# Patient Record
Sex: Male | Born: 1970 | Race: Black or African American | Hispanic: No
Health system: Southern US, Community
[De-identification: ages and names within clinical notes are randomized; demographics above are authoritative.]

## PROBLEM LIST (undated history)

## (undated) DIAGNOSIS — K219 Gastro-esophageal reflux disease without esophagitis: Secondary | ICD-10-CM

## (undated) DIAGNOSIS — I729 Aneurysm of unspecified site: Secondary | ICD-10-CM

## (undated) DIAGNOSIS — C801 Malignant (primary) neoplasm, unspecified: Secondary | ICD-10-CM

## (undated) DIAGNOSIS — T7840XA Allergy, unspecified, initial encounter: Secondary | ICD-10-CM

## (undated) DIAGNOSIS — D649 Anemia, unspecified: Secondary | ICD-10-CM

## (undated) DIAGNOSIS — C169 Malignant neoplasm of stomach, unspecified: Secondary | ICD-10-CM

## (undated) DIAGNOSIS — K621 Rectal polyp: Secondary | ICD-10-CM

## (undated) DIAGNOSIS — K859 Acute pancreatitis without necrosis or infection, unspecified: Secondary | ICD-10-CM

## (undated) DIAGNOSIS — R109 Unspecified abdominal pain: Secondary | ICD-10-CM

## (undated) DIAGNOSIS — R569 Unspecified convulsions: Secondary | ICD-10-CM

## (undated) DIAGNOSIS — J45909 Unspecified asthma, uncomplicated: Secondary | ICD-10-CM

## (undated) DIAGNOSIS — F4310 Post-traumatic stress disorder, unspecified: Secondary | ICD-10-CM

## (undated) HISTORY — DX: Aneurysm of unspecified site: I72.9

## (undated) HISTORY — PX: UPPER GASTROINTESTINAL ENDOSCOPY: SHX188

## (undated) HISTORY — PX: ESOPHAGOGASTRODUODENOSCOPY ENDOSCOPY: SHX5814

## (undated) HISTORY — DX: Rectal polyp: K62.1

## (undated) HISTORY — PX: OTHER SURGICAL HISTORY: SHX169

## (undated) HISTORY — PX: HERNIA REPAIR: SHX51

## (undated) HISTORY — DX: Unspecified convulsions: R56.9

## (undated) HISTORY — PX: CEREBRAL ANEURYSM REPAIR: SHX164

## (undated) HISTORY — DX: Allergy, unspecified, initial encounter: T78.40XA

## (undated) HISTORY — DX: Malignant neoplasm of stomach, unspecified: C16.9

---
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
## 1997-12-09 ENCOUNTER — Emergency Department (HOSPITAL_COMMUNITY): Admission: EM | Admit: 1997-12-09 | Discharge: 1997-12-09 | Payer: Self-pay | Admitting: Emergency Medicine

## 1999-01-18 ENCOUNTER — Encounter: Payer: Self-pay | Admitting: Emergency Medicine

## 1999-01-18 ENCOUNTER — Emergency Department (HOSPITAL_COMMUNITY): Admission: EM | Admit: 1999-01-18 | Discharge: 1999-01-19 | Payer: Self-pay | Admitting: Emergency Medicine

## 1999-02-05 ENCOUNTER — Emergency Department (HOSPITAL_COMMUNITY): Admission: EM | Admit: 1999-02-05 | Discharge: 1999-02-05 | Payer: Self-pay | Admitting: Emergency Medicine

## 1999-02-08 ENCOUNTER — Emergency Department (HOSPITAL_COMMUNITY): Admission: EM | Admit: 1999-02-08 | Discharge: 1999-02-08 | Payer: Self-pay | Admitting: Emergency Medicine

## 1999-04-13 ENCOUNTER — Emergency Department (HOSPITAL_COMMUNITY): Admission: EM | Admit: 1999-04-13 | Discharge: 1999-04-13 | Payer: Self-pay | Admitting: Emergency Medicine

## 1999-09-16 ENCOUNTER — Emergency Department (HOSPITAL_COMMUNITY): Admission: EM | Admit: 1999-09-16 | Discharge: 1999-09-17 | Payer: Self-pay | Admitting: Emergency Medicine

## 2000-11-20 ENCOUNTER — Emergency Department (HOSPITAL_COMMUNITY): Admission: EM | Admit: 2000-11-20 | Discharge: 2000-11-20 | Payer: Self-pay | Admitting: Emergency Medicine

## 2000-11-20 ENCOUNTER — Encounter: Payer: Self-pay | Admitting: Emergency Medicine

## 2001-09-16 ENCOUNTER — Emergency Department (HOSPITAL_COMMUNITY): Admission: EM | Admit: 2001-09-16 | Discharge: 2001-09-16 | Payer: Self-pay | Admitting: Emergency Medicine

## 2002-01-14 ENCOUNTER — Emergency Department (HOSPITAL_COMMUNITY): Admission: EM | Admit: 2002-01-14 | Discharge: 2002-01-14 | Payer: Self-pay | Admitting: Emergency Medicine

## 2002-04-14 ENCOUNTER — Emergency Department (HOSPITAL_COMMUNITY): Admission: EM | Admit: 2002-04-14 | Discharge: 2002-04-14 | Payer: Self-pay

## 2002-04-17 ENCOUNTER — Encounter: Admission: RE | Admit: 2002-04-17 | Discharge: 2002-04-17 | Payer: Self-pay | Admitting: Gastroenterology

## 2002-04-17 ENCOUNTER — Encounter: Payer: Self-pay | Admitting: Gastroenterology

## 2002-09-07 ENCOUNTER — Emergency Department (HOSPITAL_COMMUNITY): Admission: EM | Admit: 2002-09-07 | Discharge: 2002-09-08 | Payer: Self-pay | Admitting: Emergency Medicine

## 2002-09-11 ENCOUNTER — Emergency Department (HOSPITAL_COMMUNITY): Admission: EM | Admit: 2002-09-11 | Discharge: 2002-09-11 | Payer: Self-pay

## 2002-09-12 ENCOUNTER — Ambulatory Visit (HOSPITAL_COMMUNITY): Admission: RE | Admit: 2002-09-12 | Discharge: 2002-09-12 | Payer: Self-pay

## 2002-09-27 ENCOUNTER — Emergency Department (HOSPITAL_COMMUNITY): Admission: EM | Admit: 2002-09-27 | Discharge: 2002-09-28 | Payer: Self-pay | Admitting: *Deleted

## 2003-04-16 DIAGNOSIS — Z89021 Acquired absence of right finger(s): Secondary | ICD-10-CM | POA: Insufficient documentation

## 2005-02-22 ENCOUNTER — Emergency Department (HOSPITAL_COMMUNITY): Admission: EM | Admit: 2005-02-22 | Discharge: 2005-02-22 | Payer: Self-pay | Admitting: Emergency Medicine

## 2005-03-28 ENCOUNTER — Emergency Department (HOSPITAL_COMMUNITY): Admission: EM | Admit: 2005-03-28 | Discharge: 2005-03-28 | Payer: Self-pay | Admitting: Emergency Medicine

## 2005-04-26 ENCOUNTER — Emergency Department (HOSPITAL_COMMUNITY): Admission: EM | Admit: 2005-04-26 | Discharge: 2005-04-26 | Payer: Self-pay | Admitting: Emergency Medicine

## 2005-09-29 ENCOUNTER — Emergency Department (HOSPITAL_COMMUNITY): Admission: EM | Admit: 2005-09-29 | Discharge: 2005-09-29 | Payer: Self-pay | Admitting: Emergency Medicine

## 2005-10-05 ENCOUNTER — Emergency Department (HOSPITAL_COMMUNITY): Admission: EM | Admit: 2005-10-05 | Discharge: 2005-10-05 | Payer: Self-pay | Admitting: Emergency Medicine

## 2005-10-14 ENCOUNTER — Emergency Department (HOSPITAL_COMMUNITY): Admission: EM | Admit: 2005-10-14 | Discharge: 2005-10-14 | Payer: Self-pay | Admitting: Emergency Medicine

## 2005-12-28 ENCOUNTER — Ambulatory Visit (HOSPITAL_COMMUNITY): Admission: RE | Admit: 2005-12-28 | Discharge: 2005-12-28 | Payer: Self-pay | Admitting: Family Medicine

## 2006-01-04 ENCOUNTER — Ambulatory Visit: Payer: Self-pay | Admitting: Gastroenterology

## 2006-01-09 ENCOUNTER — Encounter (INDEPENDENT_AMBULATORY_CARE_PROVIDER_SITE_OTHER): Payer: Self-pay | Admitting: Specialist

## 2006-01-09 ENCOUNTER — Ambulatory Visit: Payer: Self-pay | Admitting: Gastroenterology

## 2006-01-09 DIAGNOSIS — K621 Rectal polyp: Secondary | ICD-10-CM

## 2006-01-09 HISTORY — DX: Rectal polyp: K62.1

## 2006-01-09 LAB — CONVERTED CEMR LAB
Ferritin: 41.2 ng/mL (ref 22.0–322.0)
Heterophile Ab Screen: NEGATIVE
Iron: 98 ug/dL (ref 42–165)

## 2006-01-25 ENCOUNTER — Ambulatory Visit: Payer: Self-pay | Admitting: Gastroenterology

## 2006-02-01 ENCOUNTER — Ambulatory Visit: Payer: Self-pay | Admitting: Gastroenterology

## 2006-02-12 DIAGNOSIS — F4310 Post-traumatic stress disorder, unspecified: Secondary | ICD-10-CM

## 2006-02-12 HISTORY — DX: Post-traumatic stress disorder, unspecified: F43.10

## 2006-02-26 ENCOUNTER — Ambulatory Visit: Payer: Self-pay | Admitting: Gastroenterology

## 2006-02-28 ENCOUNTER — Ambulatory Visit (HOSPITAL_COMMUNITY): Admission: RE | Admit: 2006-02-28 | Discharge: 2006-02-28 | Payer: Self-pay | Admitting: Gastroenterology

## 2006-03-21 ENCOUNTER — Encounter (INDEPENDENT_AMBULATORY_CARE_PROVIDER_SITE_OTHER): Payer: Self-pay | Admitting: *Deleted

## 2006-03-21 ENCOUNTER — Ambulatory Visit (HOSPITAL_COMMUNITY): Admission: RE | Admit: 2006-03-21 | Discharge: 2006-03-21 | Payer: Self-pay | Admitting: Gastroenterology

## 2006-03-26 ENCOUNTER — Ambulatory Visit: Payer: Self-pay | Admitting: Gastroenterology

## 2006-04-11 ENCOUNTER — Ambulatory Visit: Payer: Self-pay | Admitting: Gastroenterology

## 2006-05-14 ENCOUNTER — Ambulatory Visit: Payer: Self-pay | Admitting: Gastroenterology

## 2006-08-01 ENCOUNTER — Encounter: Admission: RE | Admit: 2006-08-01 | Discharge: 2006-08-01 | Payer: Self-pay | Admitting: Family Medicine

## 2006-08-27 ENCOUNTER — Emergency Department (HOSPITAL_COMMUNITY): Admission: EM | Admit: 2006-08-27 | Discharge: 2006-08-28 | Payer: Self-pay | Admitting: Emergency Medicine

## 2006-09-10 ENCOUNTER — Emergency Department (HOSPITAL_COMMUNITY): Admission: EM | Admit: 2006-09-10 | Discharge: 2006-09-10 | Payer: Self-pay | Admitting: *Deleted

## 2006-09-26 ENCOUNTER — Ambulatory Visit: Payer: Self-pay | Admitting: Gastroenterology

## 2006-09-26 LAB — CONVERTED CEMR LAB
ALT: 25 units/L (ref 0–53)
AST: 23 units/L (ref 0–37)
Albumin: 3.9 g/dL (ref 3.5–5.2)
Alkaline Phosphatase: 48 units/L (ref 39–117)
Amylase: 123 units/L (ref 27–131)
Basophils Absolute: 0 10*3/uL (ref 0.0–0.1)
Basophils Relative: 1.3 % — ABNORMAL HIGH (ref 0.0–1.0)
Bilirubin, Direct: 0.1 mg/dL (ref 0.0–0.3)
Eosinophils Absolute: 0 10*3/uL (ref 0.0–0.6)
Eosinophils Relative: 1.5 % (ref 0.0–5.0)
HCT: 41.8 % (ref 39.0–52.0)
Hemoglobin: 14.2 g/dL (ref 13.0–17.0)
Lipase: 13 units/L (ref 11.0–59.0)
Lymphocytes Relative: 48.9 % — ABNORMAL HIGH (ref 12.0–46.0)
MCHC: 33.9 g/dL (ref 30.0–36.0)
MCV: 91.6 fL (ref 78.0–100.0)
Monocytes Absolute: 0.4 10*3/uL (ref 0.2–0.7)
Monocytes Relative: 11 % (ref 3.0–11.0)
Neutro Abs: 1.2 10*3/uL — ABNORMAL LOW (ref 1.4–7.7)
Neutrophils Relative %: 37.3 % — ABNORMAL LOW (ref 43.0–77.0)
Platelets: 202 10*3/uL (ref 150–400)
RBC: 4.56 M/uL (ref 4.22–5.81)
RDW: 13.2 % (ref 11.5–14.6)
Sed Rate: 4 mm/hr (ref 0–20)
Total Bilirubin: 0.8 mg/dL (ref 0.3–1.2)
Total Protein: 6.9 g/dL (ref 6.0–8.3)
WBC: 3.3 10*3/uL — ABNORMAL LOW (ref 4.5–10.5)

## 2006-10-02 ENCOUNTER — Emergency Department (HOSPITAL_COMMUNITY): Admission: EM | Admit: 2006-10-02 | Discharge: 2006-10-03 | Payer: Self-pay | Admitting: Emergency Medicine

## 2006-10-10 ENCOUNTER — Encounter: Payer: Self-pay | Admitting: Gastroenterology

## 2006-10-10 ENCOUNTER — Ambulatory Visit (HOSPITAL_COMMUNITY): Admission: RE | Admit: 2006-10-10 | Discharge: 2006-10-10 | Payer: Self-pay | Admitting: Gastroenterology

## 2006-10-16 ENCOUNTER — Ambulatory Visit: Payer: Self-pay | Admitting: Gastroenterology

## 2006-10-19 ENCOUNTER — Emergency Department (HOSPITAL_COMMUNITY): Admission: EM | Admit: 2006-10-19 | Discharge: 2006-10-19 | Payer: Self-pay | Admitting: Emergency Medicine

## 2006-10-25 ENCOUNTER — Ambulatory Visit: Payer: Self-pay | Admitting: Gastroenterology

## 2006-11-09 ENCOUNTER — Emergency Department (HOSPITAL_COMMUNITY): Admission: EM | Admit: 2006-11-09 | Discharge: 2006-11-10 | Payer: Self-pay | Admitting: Emergency Medicine

## 2006-11-15 ENCOUNTER — Emergency Department (HOSPITAL_COMMUNITY): Admission: EM | Admit: 2006-11-15 | Discharge: 2006-11-15 | Payer: Self-pay | Admitting: Emergency Medicine

## 2006-11-19 ENCOUNTER — Ambulatory Visit: Payer: Self-pay | Admitting: Gastroenterology

## 2006-11-26 ENCOUNTER — Ambulatory Visit: Payer: Self-pay | Admitting: Gastroenterology

## 2006-11-26 LAB — CONVERTED CEMR LAB
Amylase: 87 units/L (ref 27–131)
Lipase: 14 units/L (ref 11.0–59.0)

## 2006-12-29 ENCOUNTER — Emergency Department (HOSPITAL_COMMUNITY): Admission: EM | Admit: 2006-12-29 | Discharge: 2006-12-29 | Payer: Self-pay | Admitting: Emergency Medicine

## 2007-01-13 ENCOUNTER — Emergency Department (HOSPITAL_COMMUNITY): Admission: EM | Admit: 2007-01-13 | Discharge: 2007-01-14 | Payer: Self-pay | Admitting: Emergency Medicine

## 2007-03-11 ENCOUNTER — Ambulatory Visit: Payer: Self-pay | Admitting: Gastroenterology

## 2007-03-11 LAB — CONVERTED CEMR LAB
ALT: 191 units/L — ABNORMAL HIGH (ref 0–53)
AST: 128 units/L — ABNORMAL HIGH (ref 0–37)
Albumin: 3.6 g/dL (ref 3.5–5.2)
Alkaline Phosphatase: 69 units/L (ref 39–117)
Amylase: 71 units/L (ref 27–131)
BUN: 4 mg/dL — ABNORMAL LOW (ref 6–23)
Basophils Absolute: 0 10*3/uL (ref 0.0–0.1)
Basophils Relative: 0.1 % (ref 0.0–1.0)
Bilirubin, Direct: 0.1 mg/dL (ref 0.0–0.3)
CO2: 30 meq/L (ref 19–32)
Calcium: 9 mg/dL (ref 8.4–10.5)
Chloride: 101 meq/L (ref 96–112)
Creatinine, Ser: 0.9 mg/dL (ref 0.4–1.5)
Eosinophils Absolute: 0 10*3/uL (ref 0.0–0.6)
Eosinophils Relative: 0.7 % (ref 0.0–5.0)
GFR calc Af Amer: 121 mL/min
GFR calc non Af Amer: 100 mL/min
Glucose, Bld: 108 mg/dL — ABNORMAL HIGH (ref 70–99)
HCT: 41.7 % (ref 39.0–52.0)
Hemoglobin: 14 g/dL (ref 13.0–17.0)
Lipase: 19 units/L (ref 11.0–59.0)
Lymphocytes Relative: 44.3 % (ref 12.0–46.0)
MCHC: 33.6 g/dL (ref 30.0–36.0)
MCV: 92.9 fL (ref 78.0–100.0)
Monocytes Absolute: 0.5 10*3/uL (ref 0.2–0.7)
Monocytes Relative: 10.5 % (ref 3.0–11.0)
Neutro Abs: 2.1 10*3/uL (ref 1.4–7.7)
Neutrophils Relative %: 44.4 % (ref 43.0–77.0)
Platelets: 188 10*3/uL (ref 150–400)
Potassium: 3.9 meq/L (ref 3.5–5.1)
RBC: 4.49 M/uL (ref 4.22–5.81)
RDW: 13.1 % (ref 11.5–14.6)
Sodium: 137 meq/L (ref 135–145)
TSH: 0.68 microintl units/mL (ref 0.35–5.50)
Total Bilirubin: 0.4 mg/dL (ref 0.3–1.2)
Total Protein: 6.4 g/dL (ref 6.0–8.3)
WBC: 4.8 10*3/uL (ref 4.5–10.5)

## 2007-03-13 ENCOUNTER — Emergency Department (HOSPITAL_COMMUNITY): Admission: EM | Admit: 2007-03-13 | Discharge: 2007-03-13 | Payer: Self-pay | Admitting: Emergency Medicine

## 2007-03-19 ENCOUNTER — Emergency Department (HOSPITAL_COMMUNITY): Admission: EM | Admit: 2007-03-19 | Discharge: 2007-03-20 | Payer: Self-pay | Admitting: Emergency Medicine

## 2007-03-25 ENCOUNTER — Ambulatory Visit: Payer: Self-pay | Admitting: Gastroenterology

## 2007-03-26 ENCOUNTER — Inpatient Hospital Stay (HOSPITAL_COMMUNITY): Admission: RE | Admit: 2007-03-26 | Discharge: 2007-03-29 | Payer: Self-pay | Admitting: Psychiatry

## 2007-03-26 ENCOUNTER — Emergency Department (HOSPITAL_COMMUNITY): Admission: EM | Admit: 2007-03-26 | Discharge: 2007-03-26 | Payer: Self-pay | Admitting: Emergency Medicine

## 2007-03-26 ENCOUNTER — Ambulatory Visit: Payer: Self-pay | Admitting: Psychiatry

## 2007-04-07 ENCOUNTER — Ambulatory Visit (HOSPITAL_COMMUNITY): Admission: RE | Admit: 2007-04-07 | Discharge: 2007-04-07 | Payer: Self-pay | Admitting: Gastroenterology

## 2007-04-15 DIAGNOSIS — K861 Other chronic pancreatitis: Secondary | ICD-10-CM | POA: Insufficient documentation

## 2007-04-15 DIAGNOSIS — F1910 Other psychoactive substance abuse, uncomplicated: Secondary | ICD-10-CM | POA: Insufficient documentation

## 2007-04-15 DIAGNOSIS — F1011 Alcohol abuse, in remission: Secondary | ICD-10-CM | POA: Insufficient documentation

## 2007-04-15 DIAGNOSIS — F1020 Alcohol dependence, uncomplicated: Secondary | ICD-10-CM | POA: Insufficient documentation

## 2007-05-17 ENCOUNTER — Emergency Department (HOSPITAL_COMMUNITY): Admission: EM | Admit: 2007-05-17 | Discharge: 2007-05-17 | Payer: Self-pay | Admitting: Emergency Medicine

## 2007-10-10 ENCOUNTER — Ambulatory Visit: Payer: Self-pay | Admitting: Internal Medicine

## 2007-10-10 DIAGNOSIS — F329 Major depressive disorder, single episode, unspecified: Secondary | ICD-10-CM | POA: Insufficient documentation

## 2007-10-10 DIAGNOSIS — J45909 Unspecified asthma, uncomplicated: Secondary | ICD-10-CM | POA: Insufficient documentation

## 2007-10-10 DIAGNOSIS — G4700 Insomnia, unspecified: Secondary | ICD-10-CM | POA: Insufficient documentation

## 2007-10-10 DIAGNOSIS — L089 Local infection of the skin and subcutaneous tissue, unspecified: Secondary | ICD-10-CM | POA: Insufficient documentation

## 2007-10-15 ENCOUNTER — Ambulatory Visit: Payer: Self-pay | Admitting: *Deleted

## 2007-11-14 ENCOUNTER — Telehealth (INDEPENDENT_AMBULATORY_CARE_PROVIDER_SITE_OTHER): Payer: Self-pay | Admitting: Internal Medicine

## 2007-11-18 ENCOUNTER — Encounter (INDEPENDENT_AMBULATORY_CARE_PROVIDER_SITE_OTHER): Payer: Self-pay | Admitting: Internal Medicine

## 2007-11-26 ENCOUNTER — Telehealth (INDEPENDENT_AMBULATORY_CARE_PROVIDER_SITE_OTHER): Payer: Self-pay | Admitting: Internal Medicine

## 2007-12-12 ENCOUNTER — Ambulatory Visit: Payer: Self-pay | Admitting: Internal Medicine

## 2007-12-12 LAB — CONVERTED CEMR LAB
ALT: 23 units/L (ref 0–53)
AST: 18 units/L (ref 0–37)
Albumin: 4.6 g/dL (ref 3.5–5.2)
Alkaline Phosphatase: 56 units/L (ref 39–117)
BUN: 11 mg/dL (ref 6–23)
Basophils Absolute: 0 10*3/uL (ref 0.0–0.1)
Basophils Relative: 0 % (ref 0–1)
Blood in Urine, dipstick: NEGATIVE
CO2: 26 meq/L (ref 19–32)
Calcium: 9.7 mg/dL (ref 8.4–10.5)
Chloride: 100 meq/L (ref 96–112)
Cholesterol: 235 mg/dL — ABNORMAL HIGH (ref 0–200)
Creatinine, Ser: 1.02 mg/dL (ref 0.40–1.50)
Eosinophils Absolute: 0.1 10*3/uL (ref 0.0–0.7)
Eosinophils Relative: 2 % (ref 0–5)
Glucose, Bld: 94 mg/dL (ref 70–99)
Glucose, Urine, Semiquant: NEGATIVE
HCT: 47.5 % (ref 39.0–52.0)
HDL: 46 mg/dL (ref >39)
Hemoglobin: 15.7 g/dL (ref 13.0–17.0)
Ketones, urine, test strip: NEGATIVE
LDL Cholesterol: 169 mg/dL — ABNORMAL HIGH (ref 0–99)
Lymphocytes Relative: 42 % (ref 12–46)
Lymphs Abs: 1.9 10*3/uL (ref 0.7–4.0)
MCHC: 33.1 g/dL (ref 30.0–36.0)
MCV: 92.1 fL (ref 78.0–100.0)
Monocytes Absolute: 0.6 10*3/uL (ref 0.1–1.0)
Monocytes Relative: 12 % (ref 3–12)
Neutro Abs: 2 10*3/uL (ref 1.7–7.7)
Neutrophils Relative %: 44 % (ref 43–77)
Nitrite: NEGATIVE
Platelets: 256 10*3/uL (ref 150–400)
Potassium: 4.2 meq/L (ref 3.5–5.3)
RBC: 5.16 M/uL (ref 4.22–5.81)
RDW: 15 % (ref 11.5–15.5)
Sodium: 138 meq/L (ref 135–145)
Specific Gravity, Urine: >=1.03
Total Bilirubin: 0.7 mg/dL (ref 0.3–1.2)
Total CHOL/HDL Ratio: 5.1
Total Protein: 7.3 g/dL (ref 6.0–8.3)
Triglycerides: 99 mg/dL (ref <150)
Urobilinogen, UA: 0.2
VLDL: 20 mg/dL (ref 0–40)
WBC Urine, dipstick: NEGATIVE
WBC: 4.6 10*3/uL (ref 4.0–10.5)
pH: 5.5

## 2007-12-26 ENCOUNTER — Encounter (INDEPENDENT_AMBULATORY_CARE_PROVIDER_SITE_OTHER): Payer: Self-pay | Admitting: Internal Medicine

## 2008-01-22 ENCOUNTER — Telehealth (INDEPENDENT_AMBULATORY_CARE_PROVIDER_SITE_OTHER): Payer: Self-pay | Admitting: Internal Medicine

## 2008-03-04 ENCOUNTER — Telehealth (INDEPENDENT_AMBULATORY_CARE_PROVIDER_SITE_OTHER): Payer: Self-pay | Admitting: Internal Medicine

## 2008-03-29 ENCOUNTER — Telehealth (INDEPENDENT_AMBULATORY_CARE_PROVIDER_SITE_OTHER): Payer: Self-pay | Admitting: Internal Medicine

## 2008-04-08 ENCOUNTER — Encounter (INDEPENDENT_AMBULATORY_CARE_PROVIDER_SITE_OTHER): Payer: Self-pay | Admitting: Internal Medicine

## 2008-04-17 ENCOUNTER — Emergency Department (HOSPITAL_COMMUNITY): Admission: EM | Admit: 2008-04-17 | Discharge: 2008-04-17 | Payer: Self-pay | Admitting: Emergency Medicine

## 2008-04-27 ENCOUNTER — Emergency Department (HOSPITAL_COMMUNITY): Admission: EM | Admit: 2008-04-27 | Discharge: 2008-04-27 | Payer: Self-pay | Admitting: Emergency Medicine

## 2008-05-07 ENCOUNTER — Telehealth (INDEPENDENT_AMBULATORY_CARE_PROVIDER_SITE_OTHER): Payer: Self-pay | Admitting: Internal Medicine

## 2008-05-16 ENCOUNTER — Emergency Department (HOSPITAL_COMMUNITY): Admission: EM | Admit: 2008-05-16 | Discharge: 2008-05-16 | Payer: Self-pay | Admitting: Emergency Medicine

## 2008-05-17 ENCOUNTER — Emergency Department (HOSPITAL_COMMUNITY): Admission: EM | Admit: 2008-05-17 | Discharge: 2008-05-17 | Payer: Self-pay | Admitting: Emergency Medicine

## 2008-05-27 ENCOUNTER — Ambulatory Visit: Payer: Self-pay | Admitting: Internal Medicine

## 2008-05-27 LAB — CONVERTED CEMR LAB
Bilirubin Urine: NEGATIVE
Blood in Urine, dipstick: NEGATIVE
Glucose, Urine, Semiquant: NEGATIVE
Ketones, urine, test strip: NEGATIVE
Nitrite: NEGATIVE
Protein, U semiquant: NEGATIVE
Specific Gravity, Urine: 1.015
Urobilinogen, UA: 0.2
pH: 7

## 2008-05-28 ENCOUNTER — Encounter (INDEPENDENT_AMBULATORY_CARE_PROVIDER_SITE_OTHER): Payer: Self-pay | Admitting: Internal Medicine

## 2008-06-03 ENCOUNTER — Inpatient Hospital Stay (HOSPITAL_COMMUNITY): Admission: EM | Admit: 2008-06-03 | Discharge: 2008-06-05 | Payer: Self-pay | Admitting: Emergency Medicine

## 2008-06-09 LAB — CONVERTED CEMR LAB
Amphetamine Screen, Ur: NEGATIVE
Barbiturate Quant, Ur: NEGATIVE
Benzodiazepines.: NEGATIVE
Cocaine Metabolites: NEGATIVE
Creatinine,U: 136.7 mg/dL
Marijuana Metabolite: POSITIVE — AB
Methadone: NEGATIVE
Opiate Screen, Urine: NEGATIVE
Phencyclidine (PCP): NEGATIVE
Propoxyphene: NEGATIVE

## 2008-06-14 ENCOUNTER — Telehealth (INDEPENDENT_AMBULATORY_CARE_PROVIDER_SITE_OTHER): Payer: Self-pay | Admitting: Internal Medicine

## 2008-06-17 ENCOUNTER — Emergency Department (HOSPITAL_COMMUNITY): Admission: EM | Admit: 2008-06-17 | Discharge: 2008-06-17 | Payer: Self-pay | Admitting: Emergency Medicine

## 2008-06-17 ENCOUNTER — Encounter (INDEPENDENT_AMBULATORY_CARE_PROVIDER_SITE_OTHER): Payer: Self-pay | Admitting: Internal Medicine

## 2008-06-24 ENCOUNTER — Emergency Department (HOSPITAL_COMMUNITY): Admission: EM | Admit: 2008-06-24 | Discharge: 2008-06-24 | Payer: Self-pay | Admitting: Emergency Medicine

## 2008-06-25 ENCOUNTER — Emergency Department (HOSPITAL_COMMUNITY): Admission: EM | Admit: 2008-06-25 | Discharge: 2008-06-25 | Payer: Self-pay | Admitting: Emergency Medicine

## 2008-06-29 ENCOUNTER — Encounter (INDEPENDENT_AMBULATORY_CARE_PROVIDER_SITE_OTHER): Payer: Self-pay | Admitting: Internal Medicine

## 2008-07-06 ENCOUNTER — Emergency Department (HOSPITAL_COMMUNITY): Admission: EM | Admit: 2008-07-06 | Discharge: 2008-07-06 | Payer: Self-pay | Admitting: Emergency Medicine

## 2008-07-13 ENCOUNTER — Emergency Department (HOSPITAL_COMMUNITY): Admission: EM | Admit: 2008-07-13 | Discharge: 2008-07-13 | Payer: Self-pay | Admitting: Emergency Medicine

## 2008-07-28 ENCOUNTER — Emergency Department (HOSPITAL_COMMUNITY): Admission: EM | Admit: 2008-07-28 | Discharge: 2008-07-29 | Payer: Self-pay | Admitting: Emergency Medicine

## 2008-07-28 ENCOUNTER — Telehealth (INDEPENDENT_AMBULATORY_CARE_PROVIDER_SITE_OTHER): Payer: Self-pay | Admitting: Internal Medicine

## 2008-07-31 ENCOUNTER — Emergency Department (HOSPITAL_COMMUNITY): Admission: EM | Admit: 2008-07-31 | Discharge: 2008-07-31 | Payer: Self-pay | Admitting: Emergency Medicine

## 2008-08-12 ENCOUNTER — Emergency Department (HOSPITAL_COMMUNITY): Admission: EM | Admit: 2008-08-12 | Discharge: 2008-08-13 | Payer: Self-pay | Admitting: Emergency Medicine

## 2008-08-20 ENCOUNTER — Emergency Department (HOSPITAL_COMMUNITY): Admission: EM | Admit: 2008-08-20 | Discharge: 2008-08-21 | Payer: Self-pay | Admitting: Emergency Medicine

## 2008-08-24 ENCOUNTER — Emergency Department (HOSPITAL_COMMUNITY): Admission: EM | Admit: 2008-08-24 | Discharge: 2008-08-25 | Payer: Self-pay | Admitting: Emergency Medicine

## 2008-09-21 ENCOUNTER — Telehealth (INDEPENDENT_AMBULATORY_CARE_PROVIDER_SITE_OTHER): Payer: Self-pay | Admitting: Nurse Practitioner

## 2008-09-27 ENCOUNTER — Encounter (INDEPENDENT_AMBULATORY_CARE_PROVIDER_SITE_OTHER): Payer: Self-pay | Admitting: *Deleted

## 2008-10-09 ENCOUNTER — Emergency Department (HOSPITAL_COMMUNITY): Admission: EM | Admit: 2008-10-09 | Discharge: 2008-10-10 | Payer: Self-pay | Admitting: Emergency Medicine

## 2008-10-20 ENCOUNTER — Observation Stay (HOSPITAL_COMMUNITY): Admission: EM | Admit: 2008-10-20 | Discharge: 2008-10-20 | Payer: Self-pay | Admitting: Emergency Medicine

## 2008-10-28 ENCOUNTER — Ambulatory Visit: Payer: Self-pay | Admitting: Internal Medicine

## 2008-10-28 DIAGNOSIS — R3589 Other polyuria: Secondary | ICD-10-CM | POA: Insufficient documentation

## 2008-10-28 DIAGNOSIS — R358 Other polyuria: Secondary | ICD-10-CM

## 2008-10-28 DIAGNOSIS — R1013 Epigastric pain: Secondary | ICD-10-CM | POA: Insufficient documentation

## 2008-10-28 LAB — CONVERTED CEMR LAB
ALT: 15 units/L (ref 0–53)
AST: 13 units/L (ref 0–37)
Albumin: 4.4 g/dL (ref 3.5–5.2)
Alkaline Phosphatase: 50 units/L (ref 39–117)
BUN: 10 mg/dL (ref 6–23)
Basophils Absolute: 0 10*3/uL (ref 0.0–0.1)
Basophils Relative: 1 % (ref 0–1)
Blood Glucose, AC Bkfst: 76 mg/dL
Blood in Urine, dipstick: NEGATIVE
CO2: 21 meq/L (ref 19–32)
Calcium: 9 mg/dL (ref 8.4–10.5)
Chloride: 106 meq/L (ref 96–112)
Creatinine, Ser: 1.07 mg/dL (ref 0.40–1.50)
Eosinophils Absolute: 0 10*3/uL (ref 0.0–0.7)
Eosinophils Relative: 1 % (ref 0–5)
Glucose, Bld: 79 mg/dL (ref 70–99)
Glucose, Urine, Semiquant: NEGATIVE
HCT: 46.2 % (ref 39.0–52.0)
Hemoglobin: 15.3 g/dL (ref 13.0–17.0)
Ketones, urine, test strip: NEGATIVE
Lymphocytes Relative: 50 % — ABNORMAL HIGH (ref 12–46)
Lymphs Abs: 2.2 10*3/uL (ref 0.7–4.0)
MCHC: 33.1 g/dL (ref 30.0–36.0)
MCV: 92 fL (ref 78.0–100.0)
Monocytes Absolute: 0.4 10*3/uL (ref 0.1–1.0)
Monocytes Relative: 9 % (ref 3–12)
Neutro Abs: 1.8 10*3/uL (ref 1.7–7.7)
Neutrophils Relative %: 40 % — ABNORMAL LOW (ref 43–77)
Nitrite: NEGATIVE
Platelets: 237 10*3/uL (ref 150–400)
Potassium: 4.2 meq/L (ref 3.5–5.3)
Protein, U semiquant: 30
RBC: 5.02 M/uL (ref 4.22–5.81)
RDW: 14.7 % (ref 11.5–15.5)
Sodium: 142 meq/L (ref 135–145)
Specific Gravity, Urine: >=1.03
Total Bilirubin: 0.6 mg/dL (ref 0.3–1.2)
Total Protein: 7.3 g/dL (ref 6.0–8.3)
Urobilinogen, UA: 0.2
WBC Urine, dipstick: NEGATIVE
WBC: 4.4 10*3/uL (ref 4.0–10.5)
pH: 6

## 2008-10-29 ENCOUNTER — Encounter (INDEPENDENT_AMBULATORY_CARE_PROVIDER_SITE_OTHER): Payer: Self-pay | Admitting: Internal Medicine

## 2008-11-25 ENCOUNTER — Emergency Department (HOSPITAL_COMMUNITY): Admission: EM | Admit: 2008-11-25 | Discharge: 2008-11-25 | Payer: Self-pay | Admitting: Emergency Medicine

## 2008-12-06 ENCOUNTER — Telehealth (INDEPENDENT_AMBULATORY_CARE_PROVIDER_SITE_OTHER): Payer: Self-pay | Admitting: Internal Medicine

## 2008-12-12 ENCOUNTER — Emergency Department (HOSPITAL_COMMUNITY): Admission: EM | Admit: 2008-12-12 | Discharge: 2008-12-12 | Payer: Self-pay | Admitting: Emergency Medicine

## 2008-12-31 ENCOUNTER — Telehealth (INDEPENDENT_AMBULATORY_CARE_PROVIDER_SITE_OTHER): Payer: Self-pay | Admitting: Internal Medicine

## 2009-01-01 ENCOUNTER — Emergency Department (HOSPITAL_COMMUNITY): Admission: EM | Admit: 2009-01-01 | Discharge: 2009-01-02 | Payer: Self-pay | Admitting: Emergency Medicine

## 2009-01-12 ENCOUNTER — Ambulatory Visit: Payer: Self-pay | Admitting: Internal Medicine

## 2009-01-24 ENCOUNTER — Emergency Department (HOSPITAL_COMMUNITY): Admission: EM | Admit: 2009-01-24 | Discharge: 2009-01-24 | Payer: Self-pay | Admitting: Emergency Medicine

## 2009-01-26 ENCOUNTER — Telehealth (INDEPENDENT_AMBULATORY_CARE_PROVIDER_SITE_OTHER): Payer: Self-pay | Admitting: Internal Medicine

## 2009-02-05 DIAGNOSIS — S93409A Sprain of unspecified ligament of unspecified ankle, initial encounter: Secondary | ICD-10-CM | POA: Insufficient documentation

## 2009-02-05 DIAGNOSIS — S335XXA Sprain of ligaments of lumbar spine, initial encounter: Secondary | ICD-10-CM | POA: Insufficient documentation

## 2009-02-07 ENCOUNTER — Emergency Department (HOSPITAL_COMMUNITY): Admission: EM | Admit: 2009-02-07 | Discharge: 2009-02-07 | Payer: Self-pay | Admitting: Emergency Medicine

## 2009-02-08 ENCOUNTER — Emergency Department (HOSPITAL_COMMUNITY): Admission: EM | Admit: 2009-02-08 | Discharge: 2009-02-08 | Payer: Self-pay | Admitting: Emergency Medicine

## 2009-02-09 ENCOUNTER — Ambulatory Visit: Payer: Self-pay | Admitting: Internal Medicine

## 2009-02-15 ENCOUNTER — Telehealth (INDEPENDENT_AMBULATORY_CARE_PROVIDER_SITE_OTHER): Payer: Self-pay | Admitting: Internal Medicine

## 2009-03-05 ENCOUNTER — Emergency Department (HOSPITAL_COMMUNITY): Admission: EM | Admit: 2009-03-05 | Discharge: 2009-03-05 | Payer: Self-pay | Admitting: Emergency Medicine

## 2009-03-10 ENCOUNTER — Telehealth (INDEPENDENT_AMBULATORY_CARE_PROVIDER_SITE_OTHER): Payer: Self-pay | Admitting: Internal Medicine

## 2009-03-10 ENCOUNTER — Emergency Department (HOSPITAL_COMMUNITY): Admission: EM | Admit: 2009-03-10 | Discharge: 2009-03-10 | Payer: Self-pay | Admitting: Emergency Medicine

## 2009-03-11 ENCOUNTER — Ambulatory Visit: Payer: Self-pay | Admitting: Internal Medicine

## 2009-03-11 LAB — CONVERTED CEMR LAB
Bilirubin Urine: NEGATIVE
Blood in Urine, dipstick: NEGATIVE
Glucose, Urine, Semiquant: NEGATIVE
Ketones, urine, test strip: NEGATIVE
Nitrite: NEGATIVE
Protein, U semiquant: NEGATIVE
Specific Gravity, Urine: 1.015
Urobilinogen, UA: 1
WBC Urine, dipstick: NEGATIVE
pH: 7.5

## 2009-03-21 ENCOUNTER — Telehealth (INDEPENDENT_AMBULATORY_CARE_PROVIDER_SITE_OTHER): Payer: Self-pay | Admitting: *Deleted

## 2009-03-25 ENCOUNTER — Encounter (INDEPENDENT_AMBULATORY_CARE_PROVIDER_SITE_OTHER): Payer: Self-pay | Admitting: Internal Medicine

## 2009-04-08 ENCOUNTER — Telehealth (INDEPENDENT_AMBULATORY_CARE_PROVIDER_SITE_OTHER): Payer: Self-pay | Admitting: *Deleted

## 2009-04-17 ENCOUNTER — Emergency Department (HOSPITAL_COMMUNITY): Admission: EM | Admit: 2009-04-17 | Discharge: 2009-04-17 | Payer: Self-pay | Admitting: Emergency Medicine

## 2009-07-12 ENCOUNTER — Emergency Department (HOSPITAL_COMMUNITY): Admission: EM | Admit: 2009-07-12 | Discharge: 2009-07-12 | Payer: Self-pay | Admitting: Emergency Medicine

## 2009-08-03 ENCOUNTER — Telehealth (INDEPENDENT_AMBULATORY_CARE_PROVIDER_SITE_OTHER): Payer: Self-pay | Admitting: Internal Medicine

## 2009-08-17 ENCOUNTER — Ambulatory Visit: Payer: Self-pay | Admitting: Internal Medicine

## 2009-09-06 ENCOUNTER — Telehealth (INDEPENDENT_AMBULATORY_CARE_PROVIDER_SITE_OTHER): Payer: Self-pay | Admitting: Internal Medicine

## 2009-09-08 DIAGNOSIS — R7309 Other abnormal glucose: Secondary | ICD-10-CM | POA: Insufficient documentation

## 2009-09-08 LAB — CONVERTED CEMR LAB
ALT: 14 units/L (ref 0–53)
AST: 18 units/L (ref 0–37)
Albumin: 4.1 g/dL (ref 3.5–5.2)
Alkaline Phosphatase: 44 units/L (ref 39–117)
BUN: 13 mg/dL (ref 6–23)
Basophils Absolute: 0 10*3/uL (ref 0.0–0.1)
Basophils Relative: 0 % (ref 0–1)
CO2: 22 meq/L (ref 19–32)
Calcium: 9.1 mg/dL (ref 8.4–10.5)
Chloride: 106 meq/L (ref 96–112)
Creatinine, Ser: 0.99 mg/dL (ref 0.40–1.50)
Eosinophils Absolute: 0 10*3/uL (ref 0.0–0.7)
Eosinophils Relative: 1 % (ref 0–5)
Glucose, Bld: 109 mg/dL — ABNORMAL HIGH (ref 70–99)
HCT: 43.5 % (ref 39.0–52.0)
Hemoglobin: 14.7 g/dL (ref 13.0–17.0)
Lymphocytes Relative: 51 % — ABNORMAL HIGH (ref 12–46)
Lymphs Abs: 2.1 10*3/uL (ref 0.7–4.0)
MCHC: 33.8 g/dL (ref 30.0–36.0)
MCV: 91.4 fL (ref 78.0–100.0)
Monocytes Absolute: 0.6 10*3/uL (ref 0.1–1.0)
Monocytes Relative: 13 % — ABNORMAL HIGH (ref 3–12)
Neutro Abs: 1.4 10*3/uL — ABNORMAL LOW (ref 1.7–7.7)
Neutrophils Relative %: 34 % — ABNORMAL LOW (ref 43–77)
Platelets: 215 10*3/uL (ref 150–400)
Potassium: 3.5 meq/L (ref 3.5–5.3)
RBC: 4.76 M/uL (ref 4.22–5.81)
RDW: 15.1 % (ref 11.5–15.5)
Sodium: 139 meq/L (ref 135–145)
Total Bilirubin: 0.8 mg/dL (ref 0.3–1.2)
Total Protein: 6.7 g/dL (ref 6.0–8.3)
WBC: 4.2 10*3/uL (ref 4.0–10.5)

## 2009-09-15 ENCOUNTER — Ambulatory Visit: Payer: Self-pay | Admitting: Internal Medicine

## 2009-09-15 LAB — CONVERTED CEMR LAB: Blood Glucose, AC Bkfst: 89 mg/dL

## 2009-09-21 ENCOUNTER — Encounter (INDEPENDENT_AMBULATORY_CARE_PROVIDER_SITE_OTHER): Payer: Self-pay | Admitting: Internal Medicine

## 2009-10-16 ENCOUNTER — Emergency Department (HOSPITAL_COMMUNITY): Admission: EM | Admit: 2009-10-16 | Discharge: 2009-10-16 | Payer: Self-pay | Admitting: Emergency Medicine

## 2009-11-14 ENCOUNTER — Telehealth (INDEPENDENT_AMBULATORY_CARE_PROVIDER_SITE_OTHER): Payer: Self-pay | Admitting: Internal Medicine

## 2009-11-18 ENCOUNTER — Ambulatory Visit: Payer: Self-pay | Admitting: Internal Medicine

## 2009-11-18 DIAGNOSIS — M25569 Pain in unspecified knee: Secondary | ICD-10-CM | POA: Insufficient documentation

## 2009-12-26 ENCOUNTER — Telehealth (INDEPENDENT_AMBULATORY_CARE_PROVIDER_SITE_OTHER): Payer: Self-pay | Admitting: Internal Medicine

## 2010-01-17 ENCOUNTER — Telehealth (INDEPENDENT_AMBULATORY_CARE_PROVIDER_SITE_OTHER): Payer: Self-pay | Admitting: Internal Medicine

## 2010-02-05 ENCOUNTER — Emergency Department (HOSPITAL_COMMUNITY)
Admission: EM | Admit: 2010-02-05 | Discharge: 2010-02-05 | Payer: Self-pay | Source: Home / Self Care | Admitting: Emergency Medicine

## 2010-02-09 ENCOUNTER — Telehealth (INDEPENDENT_AMBULATORY_CARE_PROVIDER_SITE_OTHER): Payer: Self-pay | Admitting: Internal Medicine

## 2010-03-05 ENCOUNTER — Encounter: Payer: Self-pay | Admitting: Gastroenterology

## 2010-03-07 ENCOUNTER — Ambulatory Visit
Admission: RE | Admit: 2010-03-07 | Discharge: 2010-03-07 | Payer: Self-pay | Source: Home / Self Care | Attending: Internal Medicine | Admitting: Internal Medicine

## 2010-03-07 ENCOUNTER — Encounter (INDEPENDENT_AMBULATORY_CARE_PROVIDER_SITE_OTHER): Payer: Self-pay | Admitting: Internal Medicine

## 2010-03-07 DIAGNOSIS — R1084 Generalized abdominal pain: Secondary | ICD-10-CM | POA: Insufficient documentation

## 2010-03-07 LAB — CONVERTED CEMR LAB
ALT: 34 units/L (ref 0–53)
AST: 18 units/L (ref 0–37)
Albumin: 4.3 g/dL (ref 3.5–5.2)
Alkaline Phosphatase: 56 units/L (ref 39–117)
Amylase: 59 units/L (ref 0–105)
BUN: 6 mg/dL (ref 6–23)
Basophils Absolute: 0 10*3/uL (ref 0.0–0.1)
Basophils Relative: 0 % (ref 0–1)
CO2: 28 meq/L (ref 19–32)
Calcium: 9.1 mg/dL (ref 8.4–10.5)
Chloride: 105 meq/L (ref 96–112)
Creatinine, Ser: 0.98 mg/dL (ref 0.40–1.50)
Eosinophils Absolute: 0 10*3/uL (ref 0.0–0.7)
Eosinophils Relative: 0 % (ref 0–5)
Glucose, Bld: 85 mg/dL (ref 70–99)
HCT: 44.8 % (ref 39.0–52.0)
Hemoglobin: 14.9 g/dL (ref 13.0–17.0)
Lipase: 15 units/L (ref 0–75)
Lymphocytes Relative: 54 % — ABNORMAL HIGH (ref 12–46)
Lymphs Abs: 2 10*3/uL (ref 0.7–4.0)
MCHC: 33.3 g/dL (ref 30.0–36.0)
MCV: 93.9 fL (ref 78.0–100.0)
Monocytes Absolute: 0.4 10*3/uL (ref 0.1–1.0)
Monocytes Relative: 12 % (ref 3–12)
Neutro Abs: 1.3 10*3/uL — ABNORMAL LOW (ref 1.7–7.7)
Neutrophils Relative %: 34 % — ABNORMAL LOW (ref 43–77)
Platelets: 194 10*3/uL (ref 150–400)
Potassium: 4.1 meq/L (ref 3.5–5.3)
RBC: 4.77 M/uL (ref 4.22–5.81)
RDW: 14.4 % (ref 11.5–15.5)
Sodium: 142 meq/L (ref 135–145)
Total Bilirubin: 0.4 mg/dL (ref 0.3–1.2)
Total Protein: 7.2 g/dL (ref 6.0–8.3)
WBC: 3.7 10*3/uL — ABNORMAL LOW (ref 4.0–10.5)

## 2010-03-16 NOTE — Progress Notes (Signed)
Summary: NEEDS PAIN MEDICATION   Phone Note Call from Patient Call back at [phone]   Reason for Call: Refill Medication Summary of Call: [name] SAYS THAT HE NEEDS HIS PAIN MEDCATION REFILLED.  HE HAD AN APPT. TODAY BUT HE MISSED HIS ELIG APPT. ON 02/04. HE SAYS THAT HE HAS NOT SEEN ANYBODY SINCE SEEING YOU.[name] HE WANTS TO KNOW WHAT DOES HE DO IN THE MEAN TIME, JUST WALK AROUND AND HURT. Initial call taken by: Leodis Rains,  April 08, 2009 10:52 AM  Follow-up for Phone Call        Last got #30 on 02/01/09. Follow-up by: Vesta Mixer CMA,  April 08, 2009 12:24 PM  Additional Follow-up for Phone Call Additional follow up Details #1::        Pt.  broke his contract as discussed at last OV.  He needs to make an appt. to discuss before will fill again.  If he has an appt to renew card set up, I will see him--not an emergency, however. Additional Follow-up by: Julieanne Manson MD,  April 08, 2009 2:25 PM    Additional Follow-up for Phone Call Additional follow up Details #2::    Pt is scheduled for f/u here on 05/13/09 however has not had his eligibility done.  At this time eligibility is book out do 05/20/09 so pt will need to reschedule ov until after his eligibility appt. Follow-up by: Vesta Mixer CMA,  April 14, 2009 3:58 PM  Additional Follow-up for Phone Call Additional follow up Details #3:: Details for Additional Follow-up Action Taken: pt didn't show up for the appointment with Eligibility in Feb so I re-schedule his appoitnment until April 19 (eligibility) and he will see Dr Delrae Alfred on April 26.2001.Manon Hilding  April 22, 2009 10:38 AM

## 2010-03-16 NOTE — Progress Notes (Signed)
Summary: PAIN MED NOT WORKING   Phone Note Call from Patient Call back at Home Phone [phone]   Reason for Call: Refill Medication Summary of Call: Jacalynn Buzzell PT. MR Kempton CALLED AND SAYS THAT THE RX THAT WAS GIVEN, ARE NOT WORKING AND THAT THE DALOTIN THAT DR DAVID PATTERSON ONCE PRESCRIBED, WORKED BETTER, THEY WERE 4MG  Initial call taken by: Leodis Rains,  September 06, 2009 3:37 PM  Follow-up for Phone Call        MR Buechele CALLED AGAIN TODAY , BECAUSE HE IS STILL HURTING AND HE SAYS THAT HE MIGHT HAVE TO GO TO THE HOSPITAL. Follow-up by: Leodis Rains,  September 08, 2009 10:38 AM  Additional Follow-up for Phone Call Additional follow up Details #1::        Pt requesting Dilaudid because it works better for him. Additional Follow-up by: Vesta Mixer CMA,  September 08, 2009 10:40 AM    Additional Follow-up for Phone Call Additional follow up Details #2::    I don't prescribe Dilaudid. How is it not working for him?   Does he get any relief for a period of time?  If so, for how long? Is his pain just not controlled because he has used his monthly allotment already?   If so, when did he run out? Please remind pt:   If he goes to the ED and gets pain meds again, then he will be in violation of his pain contract Follow-up by: Julieanne Manson MD,  September 08, 2009 10:19 PM  Additional Follow-up for Phone Call Additional follow up Details #3:: Details for Additional Follow-up Action Taken: Pt states he gets very minimal refief at all from the percocet.  He has to take one when he first gets up and then another around 11am and another at some other point in the day.  He has been out since July 26, but does notice a difference in being off of them for the past week. .................. Tiffany McCoy CMA  September 15, 2009 9:23 AM   MR Laural Benes SAYS THAT IF WHOEVER IS COVERING FOR DR Leonda Cristo WONT PRESCRIBE THE DILAUDID, THE HE'LL TAKE THE PERCOCET RX, WHICH IS DUE TOMORROW AND HE TOOK HIS LAST  ONE TODAY.Cala Bradford Tinnin  September 16, 2009 3:23 PM Will refill percocet but will not change RX.  He will need to discuss with PCP.  However he will get 30 percocet which should last him for 1 month  n.martin,fnp September 16, 2009  4:35 PM  Pt notified....... Elmarie Shiley McCoy CMA  September 16, 2009 4:46 PM   He should have a follow up scheduled with me in a couple of months--we can discuss his pain meds then. Please make sure he is taking all his other meds, which also can help with pain.   Julieanne Manson MD  September 21, 2009 1:00 PM   Pt scheduled for 11/18/09 at 2:30.......  Tiffany McCoy CMA  September 26, 2009 3:52 PM     Prescriptions: PERCOCET 5-325 MG TABS (OXYCODONE-ACETAMINOPHEN) 1 tab by mouth two times a day as needed severe pain  #30 x 0   Entered by:   Vesta Mixer CMA   Authorized by:   Lehman Prom FNP   Signed by:   Vesta Mixer CMA on 09/16/2009   Method used:   Print then Give to Patient   RxID:   [id number]

## 2010-03-16 NOTE — Assessment & Plan Note (Signed)
Summary: stomach pain//gk   Vital Signs:  Patient profile:   40 year old male Height:      74 inches Weight:      188.5 pounds BMI:     24.29 Temp:     98.0 degrees F oral Pulse rate:   69 / minute Pulse rhythm:   regular Resp:     18 per minute BP sitting:   109 / 71  (left arm) Cuff size:   large  Vitals Entered By: Armenia Shannon (October 28, 2008 3:06 PM) CC: pt is here for stomach pains.... pt says he cant eat or it will come back up.....[name] pt has not ate in three days...[name] pt says the perocet works but don't last that long (2-3 hours)..... Is Patient Diabetic? No Pain Assessment Patient in pain? yes     Location: stomach Intensity: 10 Type: sharp Onset of pain  Chronic  Does patient need assistance? Functional Status Self care Ambulation Normal   CC:  pt is here for stomach pains.... pt says he cant eat or it will come back up.....[name] pt has not ate in three days...[name] pt says the perocet works but don't last that long (2-3 hours)......  History of Present Illness: 1.  Pancreatitis Pain:  3 days.  Every time tries to eat--everything comes back up.  Has tried pretty bland diet.  Not clear that he had tried just going to clear liquids.   Has been out of pancreatic enzymes for 2 weeks.  No definite fever.  Pt. actually out of all meds.  Wants more Percocets. Pt. did not realize he had lost so much weight--about 29 lbs.  Cannot say over what time period he lost this.  No diarrhea.  He is urinating a lot--several times a day and then about 4 times nightly--large amt each time.  Current Medications (verified): 1)  Zoloft 100 Mg Tabs (Sertraline Hcl) .[name].. 1 Tablet By Mouth Daily 2)  Trazodone Hcl 50 Mg Tabs (Trazodone Hcl) .... 3 Tablets By Mouth Nightly 3)  Ultrase Mt 20 65-20-65 Mu Cpep (Amylase-Lipase-Protease) .[name].. 1 Cap By Mouth Three Times A Day With Each Meal. 4)  Cephalexin 250 Mg Tabs (Cephalexin) .[name].. 1 Tab By Mouth Qid For 7 Days. 5)  Ambien 10 Mg Tabs (Zolpidem  Tartrate) .[name].. 1 Tab By Mouth Q Hs As Needed Sleep 6)  Percocet 5-325 Mg Tabs (Oxycodone-Acetaminophen) .[name].. 1 Tab By Mouth Two Times A Day As Needed Severe Pain  Allergies (verified): No Known Drug Allergies  Physical Exam  Mouth:  MMM, throat without injection Lungs:  Normal respiratory effort, chest expands symmetrically. Lungs are clear to auscultation, no crackles or wheezes. Heart:  Normal rate and regular rhythm. S1 and S2 normal without gallop, murmur, click, rub or other extra sounds. Abdomen:  soft, non-tender, normal bowel sounds, no guarding, no hepatomegaly, and no splenomegaly. Pt. does not seem to have tenderness when palpating during conversation.    Impression & Recommendations:  Problem # 1:  ABDOMINAL PAIN, EPIGASTRIC (ICD-789.06)  May be secondary to pancreatitis, but concerned pt. 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  Does also admit to polydipsia as well  Orders: UA Dipstick w/o Micro (manual) (81191) T-Comprehensive Metabolic Panel ([id number]) T-CBC w/Diff (08657-84696) Capillary Blood Glucose/CBG (29528)  Complete Medication List: 1)  Zoloft 100 Mg Tabs (Sertraline hcl) .[name].. 1 tablet by mouth daily 2)  Trazodone Hcl 50 Mg Tabs (Trazodone hcl) .... 3 tablets by mouth nightly 3)  Ultrase Mt 20 65-20-65 Mu Cpep (Amylase-lipase-protease) .Marland KitchenMarland KitchenMarland Kitchen  1 cap by mouth three times a day with each meal. 4)  Cephalexin 250 Mg Tabs (Cephalexin) .[name].. 1 tab by mouth qid for 7 days. 5)  Ambien 10 Mg Tabs (Zolpidem tartrate) .[name].. 1 tab by mouth q hs as needed sleep 6)  Percocet 5-325 Mg Tabs (Oxycodone-acetaminophen) .[name].. 1 tab by mouth two times a day as needed severe pain  Patient Instructions: 1)  Follow up with Dr. Delrae Alfred in 1 month --weight check Prescriptions: PERCOCET 5-325 MG TABS (OXYCODONE-ACETAMINOPHEN) 1 tab by mouth two times a day as needed severe pain  #30 x 0   Entered and Authorized by:   Julieanne Manson MD   Signed by:   Julieanne Manson MD on 10/28/2008    Method used:   Print then Give to Patient   RxID:   [id number]   Laboratory Results   Urine Tests  Date/Time Received: October 28, 2008 4:17 PM   Routine Urinalysis   Glucose: negative   (Normal Range: Negative) Bilirubin: small   (Normal Range: Negative) Ketone: negative   (Normal Range: Negative) Spec. Gravity: >=1.030   (Normal Range: 1.003-1.035) Blood: negative   (Normal Range: Negative) pH: 6.0   (Normal Range: 5.0-8.0) Protein: 30   (Normal Range: Negative) Urobilinogen: 0.2   (Normal Range: 0-1) Nitrite: negative   (Normal Range: Negative) Leukocyte Esterace: negative   (Normal Range: Negative)     Blood Tests   Date/Time Received: October 28, 2008 4:17 PM   CBG Fasting:: 76mg /dL

## 2010-03-16 NOTE — Letter (Signed)
Summary: MAILED REQUESTED RECORDS TO [facility]  MAILED REQUESTED RECORDS TO [facility]   Imported By: Arta Bruce 04/08/2008 16:55:00  _____________________________________________________________________  External Attachment:    Type:   Image     Comment:   External Document

## 2010-03-16 NOTE — Assessment & Plan Note (Signed)
Summary: STOMACH/RENEW MEDS///KT   Vital Signs:  Patient profile:   40 year old male Height:      74 inches Weight:      185 pounds BMI:     23.84 Temp:     97.8 degrees F oral Pulse rate:   59 / minute Pulse rhythm:   regular Resp:     18 per minute BP sitting:   109 / 70  (left arm) Cuff size:   large  Vitals Entered By: Armenia Shannon (August 17, 2009 8:51 AM) CC: pt says his stomach is hurting... pt says he is still unable to eat... Is Patient Diabetic? No Pain Assessment Patient in pain? no       Does patient need assistance? Functional Status Self care Ambulation Normal   CC:  pt says his stomach is hurting... pt says he is still unable to eat....  History of Present Illness: 1.  Chronic Pancreatitis:  Has been out of all meds for some time--states since last OV.  Pt.'s p--generally in morning and twice daily.  Not getting pancreatic enzymes as well, but has not noted floating or greasy stools.  Pt. willing to abide by pain contract.  Would like to get restarted on all previous meds--found they were helpful for general well being--including Zoloft and Trazodone for sleep  Current Medications (verified): 1)  Zoloft 100 Mg Tabs (Sertraline Hcl) .[name].. 1 Tablet By Mouth Daily 2)  Trazodone Hcl 50 Mg Tabs (Trazodone Hcl) .... 3 Tablets By Mouth Nightly 3)  Ultrase Mt 20 65-20-65 Mu Cpep (Amylase-Lipase-Protease) .[name].. 1 Cap By Mouth Three Times A Day With Each Meal. 4)  Cephalexin 250 Mg Tabs (Cephalexin) .[name].. 1 Tab By Mouth Qid For 7 Days. 5)  Ambien 10 Mg Tabs (Zolpidem Tartrate) .[name].. 1 Tab By Mouth Q Hs As Needed Sleep 6)  Percocet 5-325 Mg Tabs (Oxycodone-Acetaminophen) .[name].. 1 Tab By Mouth Two Times A Day As Needed Severe Pain 7)  Promethazine Hcl 25 Mg Tabs (Promethazine Hcl) .[name].. 1 Tab Every 6 Hours As Needed For Nausea and Vomiting.  Use Suppository If Unable To Keep Down 8)  Promethazine Hcl 25 Mg Supp (Promethazine Hcl) .[name].. 1 Per Rectum If Unable To Keep Oral Med  Down--Every 6 Hours As Needed 9)  Flexeril 10 Mg Tabs (Cyclobenzaprine Hcl) .[name].. 1 Tab By Mouth Every 8 Hours As Needed For Back Pain  Allergies (verified): 1)  ! Aspirin (Aspirin) 2)  ! Ibuprofen (Ibuprofen)  Physical Exam  General:  NAd Lungs:  Normal respiratory effort, chest expands symmetrically. Lungs are clear to auscultation, no crackles or wheezes. Heart:  Normal rate and regular rhythm. S1 and S2 normal without gallop, murmur, click, rub or other extra sounds.  Radial pulses normal and equal Abdomen:  Moderate epigastric tenderness--radiates to back.  no rigidity, no rebound tenderness, no hepatomegaly, and no splenomegaly.     Impression & Recommendations:  Problem # 1:  PANCREATITIS, CHRONIC (ICD-577.1) Restart pain meds--long discussion that if he gets meds again elsewhere, even ED (sent out with Rx)  will no longer supply pain medication. New pain contract signed  Problem # 2:  DEPRESSION, CHRONIC (ICD-311) Restart meds His updated medication list for this problem includes:    Zoloft 100 Mg Tabs (Sertraline hcl) .[name]... 1 tablet by mouth daily    Trazodone Hcl 50 Mg Tabs (Trazodone hcl) .Marland KitchenMarland KitchenMarland KitchenMarland Kitchen 3 tablets by mouth nightly  Complete Medication List: 1)  Zoloft 100 Mg Tabs (Sertraline hcl) .[name].. 1 tablet by  mouth daily 2)  Trazodone Hcl 50 Mg Tabs (Trazodone hcl) .... 3 tablets by mouth nightly 3)  Ultrase Mt 20 65-20-65 Mu Cpep (Amylase-lipase-protease) .[name].. 1 cap by mouth three times a day with each meal. 4)  Cephalexin 250 Mg Tabs (Cephalexin) .[name].. 1 tab by mouth qid for 7 days. 5)  Ambien 10 Mg Tabs (Zolpidem tartrate) .[name].. 1 tab by mouth q hs as needed sleep 6)  Percocet 5-325 Mg Tabs (Oxycodone-acetaminophen) .[name].. 1 tab by mouth two times a day as needed severe pain 7)  Promethazine Hcl 25 Mg Tabs (Promethazine hcl) .[name].. 1 tab every 6 hours as needed for nausea and vomiting.  use suppository if unable to keep down 8)  Promethazine Hcl 25 Mg Supp (Promethazine hcl) .[name]..  1 per rectum if unable to keep oral med down--every 6 hours as needed 9)  Flexeril 10 Mg Tabs (Cyclobenzaprine hcl) .[name].. 1 tab by mouth every 8 hours as needed for back pain  Other Orders: T-Comprehensive Metabolic Panel ([id number]) T-CBC w/Diff ([id number])  Patient Instructions: 1)  Follow up with Dr. Delrae Alfred in 3 months --weight check and pancreatitis Prescriptions: PERCOCET 5-325 MG TABS (OXYCODONE-ACETAMINOPHEN) 1 tab by mouth two times a day as needed severe pain  #30 x 0   Entered and Authorized by:   Julieanne Manson MD   Signed by:   Julieanne Manson MD on 08/17/2009   Method used:   Print then Give to Patient   RxID:   [id number] PROMETHAZINE HCL 25 MG SUPP (PROMETHAZINE HCL) 1 per rectum if unable to keep oral med down--every 6 hours as needed  #15 x 1   Entered and Authorized by:   Julieanne Manson MD   Signed by:   Julieanne Manson MD on 08/17/2009   Method used:   Faxed to ...       [facility] - [facility] (retail)       [address] [place], [place]  [address]       Ph: [phone]       Fax: [phone]   RxID:   [id number] PROMETHAZINE HCL 25 MG TABS (PROMETHAZINE HCL) 1 tab every 6 hours as needed for nausea and vomiting.  Use suppository if unable to keep down  #30 x 1   Entered and Authorized by:   Julieanne Manson MD   Signed by:   Julieanne Manson MD on 08/17/2009   Method used:   Faxed to ...       [facility] - [facility] (retail)       [address] [place], [place]  [address]       Ph: [phone]       Fax: [phone]   RxID:   [id number] ULTRASE MT 20 65-20-65 MU CPEP (AMYLASE-LIPASE-PROTEASE) 1 cap by mouth three times a day with each meal.  #90 x 11   Entered and Authorized by:   Julieanne Manson MD   Signed by:   Julieanne Manson MD on 08/17/2009   Method used:   Faxed to ...       [facility] - [facility] (retail)        [address] [place], [place]  [address]       Ph: [phone]       Fax: [phone]   RxID:   [id number] TRAZODONE HCL 50 MG TABS (TRAZODONE HCL) 3 tablets  by mouth nightly  #90 x 6   Entered and Authorized by:   Julieanne Manson MD   Signed by:   Julieanne Manson MD on 08/17/2009   Method used:   Faxed to ...       [facility] - [facility] (retail)       [address] [place], [place]  [address]       Ph: [phone]       Fax: [phone]   RxID:   [id number] ZOLOFT 100 MG TABS (SERTRALINE HCL) 1 tablet by mouth daily  #30 x 6   Entered and Authorized by:   Julieanne Manson MD   Signed by:   Julieanne Manson MD on 08/17/2009   Method used:   Faxed to ...       [facility] - [facility] (retail)       [address] [place], [place]  [address]       Ph: [phone]       Fax: [phone]   RxID:   [id number]

## 2010-03-16 NOTE — Progress Notes (Signed)
   Phone Note Outgoing Call   Summary of Call: Pt. about 15 minutes late today for OV.  Wants pain medication.  Broke pain contract with use of [facility] and seeking pain meds at ED last winter and we no longer prescribe pain meds.  Had him reread contract at that time and gave him a copy.  Note that he has been to ED as late as end of May for more pain medication.  He needs to make a new appt. and be on time.  We will discuss whether to reinstate him with pain meds then. Initial call taken by: Julieanne Manson MD,  August 03, 2009 2:41 PM  Follow-up for Phone Call        pt has appt to return on July 6 @ 8:45... pt is aware of the circumantances and was told to show for appt on time to next appt Follow-up by: Armenia Shannon,  August 03, 2009 3:19 PM

## 2010-03-16 NOTE — Letter (Signed)
Summary: Lipid Letter  [facility]  [address] [place], [place] [address]   Phone: [phone]  Fax: [phone]    12/26/2007  Kevyn Boquet [address] [place], [place]  [address]  Dear [name]:  We have carefully reviewed your last lipid profile from 12/12/2007 and the results are noted below with a summary of recommendations for lipid management.    Cholesterol:       235     Goal: <   HDL "good" Cholesterol:   46     Goal: >   LDL "bad" Cholesterol:   169     Goal: <   Triglycerides:       99     Goal: <        TLC Diet (Therapeutic Lifestyle Change): Saturated Fats & Transfatty acids should be kept < 7% of total calories ***Reduce Saturated Fats Polyunstaurated Fat can be up to 10% of total calories Monounsaturated Fat Fat can be up to 20% of total calories Total Fat should be no greater than 25-35% of total calories Carbohydrates should be 50-60% of total calories Protein should be approximately 15% of total calories Fiber should be at least 20-30 grams a day ***Increased fiber may help lower LDL Total Cholesterol should be < 200mg /day Consider adding plant stanol/sterols to diet (example: Benacol spread) ***A higher intake of unsaturated fat may reduce Triglycerides and Increase HDL    Adjunctive Measures (may lower LIPIDS and reduce risk of Heart Attack) include: Aerobic Exercise (20-30 minutes 3-4 times a week) Limit Alcohol Consumption Weight Reduction Aspirin 75-81 mg a day by mouth (if not allergic or contraindicated) Vitamin E 400 IU a day by mouth Folic Acid 1mg  a day by mouth Dietary Fiber 20-30 grams a day by mouth     Current Medications: 1)    Zoloft 100 Mg Tabs (Sertraline hcl) .... 1/2 tab by mouth daily for 7 days, then 1 tab daily thereafter. 2)    Trazodone Hcl 50 Mg Tabs (Trazodone hcl) .... 3 tabs by mouth q hs 3)    Dilaudid 4 Mg Tabs (Hydromorphone hcl) .[name].. 1 tab by mouth two times a day as needed pain 4)    Ultrase Mt 20  65-20-65 Mu Cpep (Amylase-lipase-protease) .[name].. 1 cap by mouth three times a day with each meal. 5)    Cephalexin 250 Mg Tabs (Cephalexin) .[name].. 1 tab by mouth qid for 7 days.  If you have any questions, please call. We appreciate being able to work with you.   Sincerely,    [facility] Julieanne Manson MD

## 2010-03-16 NOTE — Progress Notes (Signed)
Summary: med refills  Medications Added ZOLOFT 100 MG TABS (SERTRALINE HCL) 1 tablet by mouth daily TRAZODONE HCL 50 MG TABS (TRAZODONE HCL) 3 tablets by mouth nightly       Phone Note Call from Patient   Summary of Call: Pt called time for his percocet, trazadone, zoloft and ultrase.  Percocet was last filled on 5/14 for #30 pt can be reached at [phone]. Initial call taken by: Vesta Mixer CMA,  July 28, 2008 2:47 PM  Follow-up for Phone Call        ultrase filled 05/27/2008 with 11 refills. Given to pt at visit All others printed at nurse station Follow-up by: Lehman Prom FNP,  July 29, 2008 7:36 AM  Additional Follow-up for Phone Call Additional follow up Details #1::        Call will not go through tried 4 times will try again later. Additional Follow-up by: Vesta Mixer CMA,  July 29, 2008 11:56 AM    Additional Follow-up for Phone Call Additional follow up Details #2::    pt aware Follow-up by: Vesta Mixer CMA,  August 02, 2008 11:49 AM  New/Updated Medications: ZOLOFT 100 MG TABS (SERTRALINE HCL) 1 tablet by mouth daily TRAZODONE HCL 50 MG TABS (TRAZODONE HCL) 3 tablets by mouth nightly   Prescriptions: TRAZODONE HCL 50 MG TABS (TRAZODONE HCL) 3 tablets by mouth nightly  #90 x 6   Entered and Authorized by:   Lehman Prom FNP   Signed by:   Lehman Prom FNP on 07/29/2008   Method used:   Printed then faxed to ...         RxID:   [id number] ZOLOFT 100 MG TABS (SERTRALINE HCL) 1 tablet by mouth daily  #30 x 6   Entered and Authorized by:   Lehman Prom FNP   Signed by:   Lehman Prom FNP on 07/29/2008   Method used:   Printed then faxed to ...         RxID:   [id number] PERCOCET 5-325 MG TABS (OXYCODONE-ACETAMINOPHEN) 1 tab by mouth two times a day as needed severe pain  #30 x 0   Entered and Authorized by:   Lehman Prom FNP   Signed by:   Lehman Prom FNP on 07/29/2008   Method used:   Printed then faxed to ...        RxID:   [id number] TRAZODONE HCL 50 MG TABS (TRAZODONE HCL) 3 tablets by mouth nightly  #90 x 6   Entered and Authorized by:   Lehman Prom FNP   Signed by:   Lehman Prom FNP on 07/29/2008   Method used:   Printed then faxed to ...         RxID:   [id number] ZOLOFT 100 MG TABS (SERTRALINE HCL) 1/2 tab by mouth daily for 7 days, then 1 tab daily thereafter.  #30 x 6   Entered and Authorized by:   Lehman Prom FNP   Signed by:   Lehman Prom FNP on 07/29/2008   Method used:   Printed then faxed to ...         RxID:   [id number]

## 2010-03-16 NOTE — Progress Notes (Signed)
   Phone Note Call from Patient Call back at Home Phone [phone]   Caller: Patient Summary of Call: [name] left a form from [employer] for the provider to filled out.   The pt at this moment stayed at the shelter [facility].  Marland KitchenManon Hilding  May 07, 2008 4:47 PM    Follow-up for Phone Call        PUT FORM IN YOUR REFILL SLOT IN OUTGUIDE Follow-up by: Arta Bruce,  May 10, 2008 8:45 AM  Additional Follow-up for Phone Call Additional follow up Details #1::        Need to see pt. before filling out. Please schedule OV Additional Follow-up by: Julieanne Manson MD,  May 13, 2008 5:16 PM    Additional Follow-up for Phone Call Additional follow up Details #2::    Pt scheduled for 05/27/08. Follow-up by: Vesta Mixer CMA,  May 17, 2008 12:32 PM

## 2010-03-16 NOTE — Progress Notes (Signed)
Summary: CANT TAKE THE PERCOCET   Phone Note Call from Patient Call back at Home Phone [phone]   Reason for Call: Refill Medication Summary of Call: Ehtan Delfavero PT. MR Penaloza SAYS THAT HE CAN'T TAKE THE PERCOCET, BECAUSE ITS CAUSING HIM TO HAVE PAINS IN HIS CHEST AND HE WANTS TO KNOW IF YOU CAN PUT HIM BACK ON THE DILAUDID. AND WE CAN CALL IT INTO THE HEALTH DEPT. Initial call taken by: Leodis Rains,  March 10, 2009 4:37 PM  Follow-up for Phone Call        per Shaley Leavens pt needs to schedule an appt  for consult...stop taking perocet if making him have shest pains Follow-up by: Mikey College CMA,  March 10, 2009 4:53 PM  Additional Follow-up for Phone Call Additional follow up Details #1::        PATIENT HAS APPT ON 01/27 Additional Follow-up by: Leodis Rains,  March 10, 2009 4:56 PM

## 2010-03-16 NOTE — Progress Notes (Signed)
   Phone Note Call from Patient Call back at [facility] Phone [phone]   Caller: Patient Summary of Call: The pt is requesting for more refills from hydromorphone [facility] [address]) Phar number [phone]. Dr Delrae Alfred Initial call taken by: Manon Hilding,  March 04, 2008 9:46 AM  Follow-up for Phone Call        last rx was 01-22-08 for 30x0  will forwarder to provider Follow-up by: Armenia Shannon,  March 05, 2008 10:27 AM  Additional Follow-up for Phone Call Additional follow up Details #1::        Needs OV to get another refill after this.  Please inform him Additional Follow-up by: Julieanne Manson MD,  March 07, 2008 7:11 PM    Additional Follow-up for Phone Call Additional follow up Details #2::    PATIENT IS SCHEDULED TO COME IN ON 02/12. Follow-up by: Leodis Rains,  March 16, 2008 4:50 PM    Prescriptions: DILAUDID 4 MG TABS (HYDROMORPHONE HCL) 1 tab by mouth two times a day as needed pain  #30 x 0   Entered and Authorized by:   Julieanne Manson MD   Signed by:   Julieanne Manson MD on 03/07/2008   Method used:   Print then Give to Patient   RxID:   [id number]

## 2010-03-16 NOTE — Letter (Signed)
Summary: *HSN Results Follow up  [facility]  [address] [place], [place] [address]   Phone: [phone]  Fax: [phone]      10/29/2008   [name] [address] [place], [place]  [address]   Dear  Mr. [name],                            ____S.Drinkard,FNP   ____D. Gore,FNP       ____B. McPherson,MD   ____V. Rankins,MD    _X___E. Mulberry,MD    ____N. Daphine Deutscher, FNP  ____D. Reche Dixon, MD    ____K. Philipp Deputy, MD    ____Other     This letter is to inform you that your recent test(s):  _______Pap Smear    _____X__Lab Test     _______X-ray    ___X____ is within acceptable limits  _______ requires a medication change  _______ requires a follow-up lab visit  _______ requires a follow-up visit with your provider   Comments:  No evidence of diabetes.  Labs were all okay.  Need to see you back in 1 month to see where your weight is.       _________________________________________________________ If you have any questions, please contact our office                     Sincerely,  Julieanne Manson MD [facility]

## 2010-03-16 NOTE — Progress Notes (Signed)
Summary: NEED RX FOR PERCOCET  Phone Note Call from Patient Call back at Home Phone [phone]   Reason for Call: Refill Medication Summary of Call: mulberry pt.  MR Grenfell CALLED TO LET YOU KNOW THAT HE IS OUT OF HIS PERCOCET. Initial call taken by: Leodis Rains,  November 14, 2009 10:59 AM  Follow-up for Phone Call        Filled today as due Follow-up by: Julieanne Manson MD,  November 18, 2009 3:10 PM

## 2010-03-16 NOTE — Assessment & Plan Note (Signed)
Summary: f/u one month weight//gk   Vital Signs:  Patient profile:   40 year old male Weight:      188.4 pounds Temp:     98.3 degrees F                         oral Pulse rate:   72 / minute Pulse rhythm:   regular BP sitting:   115 / 77  (left arm) Cuff size:   regular  Vitals Entered By: Geanie Cooley (January 12, 2009 12:13 PM) CC: pt said his whole body is in pain, pt is here just for followup, pt doesnt want to get the flu shot, he said it may cause him to get the flu Is Patient Diabetic? No Pain Assessment Patient in pain? yes      Onset of pain  Chronic  Does patient need assistance? Functional Status Self care Ambulation Normal   CC:  pt said his whole body is in pain, pt is here just for followup, pt doesnt want to get the flu shot, and he said it may cause him to get the flu.  History of Present Illness: 1.  Chronic Pancreatitis:  states hurting mainly in epigastric and back area.  States also with some hand and arm discomfort that he gets when the weather turns cold as it has.  Still does not feel he is eating well secondary to pain.  Eating generally twice daily.  Ate grits and bacon yesterday.  Also ate some french fries and 1/2 burger.  Pt. does admit to staying at home and not interacting with family on the holidays.  Tries to get out.   Out of phenergan--does help when he develops nausea.  States vomits 3-4 separate days/times weekly.  Taking pancreatic enzymes--no diarrhea.   Allergies (verified): 1)  ! Aspirin (Aspirin) 2)  ! Ibuprofen (Ibuprofen)  Physical Exam  General:  NAD Lungs:  Normal respiratory effort, chest expands symmetrically. Lungs are clear to auscultation, no crackles or wheezes. Heart:  Normal rate and regular rhythm. S1 and S2 normal without gallop, murmur, click, rub or other extra sounds. Abdomen:  soft, normal bowel sounds, no rigidity, no hepatomegaly, and no splenomegaly.  Epigastric tenderness--moderate.  No rebound or  peritoneal signs   Impression & Recommendations:  Problem # 1:  PANCREATITIS, CHRONIC (ICD-577.1) Long discussion regarding finding something that interests him to keep his mind off the pain. Also encourage volunteer work. Discussed that we would most likely never get rid of his pain entirely and that increasing his narotics is not the best way to help. Encouraged a better , less fatty diet. Will start Phenergan to use on as needed basis--both oral and rectal supp.  Problem # 2:  Preventive Health Care (ICD-V70.0) Flu vaccine  Complete Medication List: 1)  Zoloft 100 Mg Tabs (Sertraline hcl) .[name].. 1 tablet by mouth daily 2)  Trazodone Hcl 50 Mg Tabs (Trazodone hcl) .... 3 tablets by mouth nightly 3)  Ultrase Mt 20 65-20-65 Mu Cpep (Amylase-lipase-protease) .[name].. 1 cap by mouth three times a day with each meal. 4)  Cephalexin 250 Mg Tabs (Cephalexin) .[name].. 1 tab by mouth qid for 7 days. 5)  Ambien 10 Mg Tabs (Zolpidem tartrate) .[name].. 1 tab by mouth q hs as needed sleep 6)  Percocet 5-325 Mg Tabs (Oxycodone-acetaminophen) .[name].. 1 tab by mouth two times a day as needed severe pain 7)  Promethazine Hcl 25 Mg Tabs (Promethazine hcl) .Marland KitchenMarland KitchenMarland Kitchen 1  tab every 6 hours as needed for nausea and vomiting.  use suppository if unable to keep down 8)  Promethazine Hcl 25 Mg Supp (Promethazine hcl) .[name].. 1 per rectum if unable to keep oral med down--every 6 hours as needed  Other Orders: Flu Vaccine 75yrs + ([address]) Admin 1st Vaccine ([address]) Admin 1st Vaccine [facility]) [phone])  Patient Instructions: 1)  Follow up with Dr. Delrae Alfred in 4 months  Prescriptions: PROMETHAZINE HCL 25 MG SUPP (PROMETHAZINE HCL) 1 per rectum if unable to keep oral med down--every 6 hours as needed  #15 x 1   Entered and Authorized by:   Julieanne Manson MD   Signed by:   Julieanne Manson MD on 01/12/2009   Method used:   Faxed to ...       [facility] (retail)       [address] [place], [place]  [address]       Ph: [phone]       Fax: [phone]   RxID:   [id number] PROMETHAZINE HCL 25 MG TABS (PROMETHAZINE HCL) 1 tab every 6 hours as needed for nausea and vomiting.  Use suppository if unable to keep down  #30 x 1   Entered and Authorized by:   Julieanne Manson MD   Signed by:   Julieanne Manson MD on 01/12/2009   Method used:   Faxed to ...       [facility] (retail)       [address] [place], [place]  [address]       Ph: [phone]       Fax: [phone]   RxID:   [id number]    Vital Signs:  Patient profile:   40 year old male Weight:      188.4 pounds Temp:     98.3 degrees F                         oral Pulse rate:   72 / minute Pulse rhythm:   regular BP sitting:   115 / 77  (left arm) Cuff size:   regular  Vitals Entered By: Geanie Cooley (January 12, 2009 12:13 PM)    Influenza Vaccine    Vaccine Type: Fluvax 3+    Site: right deltoid    Mfr: Sanofi Pasteur    Dose: 0.5 ml    Route: IM    Given by: Vesta Mixer CMA    Exp. Date: 08/11/2009    Lot #: [id number]    VIS given: 09/05/06 version given January 14, 2009.  Flu Vaccine Consent Questions    Do you have a history of severe allergic reactions to this vaccine? no    Any prior history of allergic reactions to egg and/or gelatin? no    Do you have a sensitivity to the preservative Thimersol? no    Do you have a past history of Guillan-Barre Syndrome? no    Do you currently have an acute febrile illness? no    Have you ever had a severe reaction to latex? no    Vaccine information given and explained to patient? yes

## 2010-03-16 NOTE — Letter (Signed)
Summary: DISCHARGE SUMMARY  DISCHARGE SUMMARY   Imported By: Leodis Rains 07/20/2008 15:11:58  _____________________________________________________________________  External Attachment:    Type:   Image     Comment:   External Document

## 2010-03-16 NOTE — Progress Notes (Signed)
Summary: MONTHLY MED REFILLS  Phone Note Call from Patient Call back at [facility] Phone [phone]   Reason for Call: Refill Medication Summary of Call: [name] [name]. [name] CALLED FOR HIS PERCOCET, TRAZODONE, WHICH HE SASY IS NOT HELPING HIM SLEEP AND WANTS TO KNOW IF YOU CAN GO UP IN THE DOSE AND HE ALSO NEED REFILL ON HIS ZOLOFT.  [facility] USE [facility] [address]. Initial call taken by: Leodis Rains,  January 17, 2010 8:53 AM  Follow-up for Phone Call        Sent to E. Anelly Samarin.  Dutch Quint RN  January 17, 2010 9:36 AM   Additional Follow-up for Phone Call Additional follow up Details #1::        percocet is not due until 12.15.11 Now that he has Medicare, where does he want his other meds sent? They are in my prescription folder to be faxed to where he wants them to go--make sure he is set up for Medicare Part D Additional Follow-up by: Julieanne Manson MD,  January 17, 2010 7:39 PM    Additional Follow-up for Phone Call Additional follow up Details #2::    [facility]... [name] is aware... yes medicare part d Follow-up by: Armenia Shannon,  January 18, 2010 4:45 PM  New/Updated Medications: TRAZODONE HCL 50 MG TABS (TRAZODONE HCL) 4 tablets by mouth nightly Prescriptions: TRAZODONE HCL 50 MG TABS (TRAZODONE HCL) 4 tablets by mouth nightly  #120 x 6   Entered and Authorized by:   Julieanne Manson MD   Signed by:   Julieanne Manson MD on 01/17/2010   Method used:   Printed then faxed to ...       [facility] (retail)       do not use!!!!!!       , [place]         Ph:        Fax: [phone]   RxID:   [id number] ZOLOFT 100 MG TABS (SERTRALINE HCL) 1 tablet by mouth daily  #30 x 11   Entered and Authorized by:   Julieanne Manson MD   Signed by:   Julieanne Manson MD on 01/17/2010   Method used:   Printed then faxed to ...       [facility] (retail)       do not use!!!!!!       , [place]         Ph:        Fax: [phone]   RxID:   [id number]

## 2010-03-16 NOTE — Progress Notes (Signed)
Summary: REFILL ON PERCOCET   Phone Note Call from Patient Call back at [phone]   Reason for Call: Refill Medication Summary of Call: [name] pt - PT. [name] NEEDS A REFILL ON HIS PAIN MEDICATION Initial call taken by: Leodis Rains,  December 06, 2008 8:51 AM  Follow-up for Phone Call        Last got #30 on 10/28/08. Follow-up by: Vesta Mixer CMA,  December 06, 2008 8:56 AM  Additional Follow-up for Phone Call Additional follow up Details #1::        Rx at nurse station notify pt to come pick up Additional Follow-up by: Lehman Prom FNP,  December 06, 2008 5:09 PM    Additional Follow-up for Phone Call Additional follow up Details #2::    Left message on answering machine for pt to return call  Follow-up by: Vesta Mixer CMA,  December 07, 2008 12:44 PM  Additional Follow-up for Phone Call Additional follow up Details #3:: Details for Additional Follow-up Action Taken: pt aware. Additional Follow-up by: Vesta Mixer CMA,  December 08, 2008 12:48 PM  Prescriptions: PERCOCET 5-325 MG TABS (OXYCODONE-ACETAMINOPHEN) 1 tab by mouth two times a day as needed severe pain  #30 x 0   Entered and Authorized by:   Lehman Prom FNP   Signed by:   Lehman Prom FNP on 12/06/2008   Method used:   Print then Give to Patient   RxID:   [id number]

## 2010-03-16 NOTE — Assessment & Plan Note (Signed)
Summary: NEW PATIENT / PANCREITIS  / NS   Vital Signs:  Patient Profile:   40 Years Old Male Height:     74 inches Weight:      211 pounds BMI:     27.19 Temp:     97.6 degrees F Pulse rate:   84 / minute Pulse rhythm:   regular Resp:     20 per minute BP sitting:   108 / 70  (left arm)  Pt. in pain?   yes    Location:   abdomen/finger    Intensity:   10  Vitals Entered By: Vesta Mixer CMA (October 10, 2007 10:10 AM)                  Chief Complaint:  NP-Panreatitis and possilbe infection on tip of right pinky finger (partially amputated 1995).  History of Present Illness: 40yo male here to establish as a new pt for primary care.  Concerns:    1.  Chronic Pancreatitis, felt to be secondary to alcohol abuse:  has been followed by Dr Jarold Motto, GI.   Pt. has not drunk alcohol in over 1 year.  Pt. drank liquor and [employer] 20/20 mainly on weekends.  Diagnosed with pancreatitis in 2007.  Chronic epigastric pain radiating to back--constant.  Eating makes it worse.  Currently eating ice cream  and little else secondary to pain.  Was taking dilaudid 4 mg three times a day --the last dose at night.  Was on pancreatic enzymes when followed with Dr. Jarold Motto.  2.  DDD of Lumbar spine:  Was followed by an unknown ortho back in 2005.  Cannot recall what was recommended.  Pt. with pain in left lumbar spine without radiation.  Sometimes left leg tingles down anterolateral aspect--sometimes all the way to his left foot.  3.  Hx of asthma:  Has not needed treatment since 2006.  4.  Right 5th finger stump--tip has been draining pus in past week.  Hurts a bit more than usual. Hx of accidental amputation in 2005.  5.  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  Apparently with a history of problems as a youth--not ready to discuss with me today.  Gets counseling through [facility].  Was on Zoloft and Trazodone for depression and  insomnia.  Did help, but could not afford.  6.  Hx of narcotic abuse in chart-pt. denies.       Current Allergies: No known allergies   Past Surgical History:    1.  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    3 Sisters:  Healthy as far as he knows    4 Brothers:  Healthy as far as he knows.    No children  Social History:    Unemployed--filed for disability    Lives with a friend--denies a partner    Denies any hx of IV drug use.   Risk Factors:  Tobacco use:  current    Cigars:  Yes Passive smoke exposure:  yes Drug use:  yes    Substance:  marijuana    Comments:  None since 08/16/07 Alcohol use:  yes    Type:  liquor--none in a year Exercise:  no    Physical Exam  General:     NAD Lungs:     Normal respiratory effort,  chest expands symmetrically. Lungs are clear to auscultation, no crackles or wheezes. Heart:     Normal rate and regular rhythm. S1 and S2 normal without gallop, murmur, click, rub or other extra sounds. Radial pulses normal and equal Abdomen:     soft, normal bowel sounds, no distention, no masses, no guarding, no rebound tenderness, no hepatomegaly, and no splenomegaly. Mild epigastric tenderness.     Impression & Recommendations:  Problem # 1:  PANCREATITIS, CHRONIC (ICD-577.1) Dilaudid 4 mg two times a day as needed--will fill for 30 tabs to use over 1 month, will not give more for 30 days.   Will review old records before deciding if will continue. Restart pancreatic enzymes as well.  Problem # 2:  INSOMNIA, CHRONIC (ICD-307.42) Trazodone 50-100 mg at hs.  Problem # 3:  DEPRESSION, CHRONIC (ICD-311) Restart meds His updated medication list for this problem includes:    Zoloft 100 Mg Tabs (Sertraline hcl) .[name]... 1/2 tab by mouth daily for 7 days, then 1 tab daily thereafter.    Trazodone Hcl 50 Mg Tabs (Trazodone hcl) .[name]... 1 -2  tabs by mouth q hs   Complete Medication List: 1)  Zoloft 100 Mg Tabs (Sertraline hcl) .... 1/2 tab by mouth daily for 7 days, then 1 tab daily thereafter. 2)  Trazodone Hcl 50 Mg Tabs (Trazodone hcl) .[name].. 1 -2 tabs by mouth q hs 3)  Dilaudid 4 Mg Tabs (Hydromorphone hcl) .[name].. 1 tab by mouth two times a day as needed pain 4)  Ultrase Mt 20 65-20-65 Mu Cpep (Amylase-lipase-protease) .[name].. 1 cap by mouth three times a day with each meal.   Patient Instructions: 1)  Release of info--ortho--pt. to call in physician's name 2)  Release of info--Dr. Mallie Snooks in [place] 3)  Release of info--[facility] 4)  Follow up with Dr. Delrae Alfred for CPP next available--try to make in 2 months.   Prescriptions: DILAUDID 4 MG TABS (HYDROMORPHONE HCL) 1 tab by mouth two times a day as needed pain  #30 x 0   Entered and Authorized by:   Julieanne Manson MD   Signed by:   Julieanne Manson MD on 10/10/2007   Method used:   Print then Give to Patient   RxID:   [id number] ULTRASE MT 20 65-20-65 MU CPEP (AMYLASE-LIPASE-PROTEASE) 1 cap by mouth three times a day with each meal.  #90 x 2   Entered and Authorized by:   Julieanne Manson MD   Signed by:   Julieanne Manson MD on 10/10/2007   Method used:   Print then Give to Patient   RxID:   [id number] TRAZODONE HCL 50 MG TABS (TRAZODONE HCL) 1 -2 tabs by mouth q hs  #60 x 2   Entered and Authorized by:   Julieanne Manson MD   Signed by:   Julieanne Manson MD on 10/10/2007   Method used:   Print then Give to Patient   RxID:   [id number] ZOLOFT 100 MG TABS (SERTRALINE HCL) 1/2 tab by mouth daily for 7 days, then 1 tab daily thereafter.  #30 x 2   Entered and Authorized by:   Julieanne Manson MD   Signed by:   Julieanne Manson MD on 10/10/2007   Method used:   Print then Give to Patient   RxID:   [id number]  ]  Appended Document: NEW PATIENT / PANCREITIS  / NS        Current Allergies: No  known allergies  Physical Exam  Extremities:     tip of right 5th finger stump--just below where DIP would be with 1mm opening--no drainage expressed,but tender.    Impression & Recommendations:  Problem # 1:  WOUND INFECTION (ICD-686.9) Cephalexin. Soak finger qid after taking antibiotic.  Complete Medication List: 1)  Zoloft 100 Mg Tabs (Sertraline hcl) .... 1/2 tab by mouth daily for 7 days, then 1 tab daily thereafter. 2)  Trazodone Hcl 50 Mg Tabs (Trazodone hcl) .[name].. 1 -2 tabs by mouth q hs 3)  Dilaudid 4 Mg Tabs (Hydromorphone hcl) .[name].. 1 tab by mouth two times a day as needed pain 4)  Ultrase Mt 20 65-20-65 Mu Cpep (Amylase-lipase-protease) .[name].. 1 cap by mouth three times a day with each meal. 5)  Cephalexin 250 Mg Tabs (Cephalexin) .[name].. 1 tab by mouth qid for 7 days.    Prescriptions: CEPHALEXIN 250 MG TABS (CEPHALEXIN) 1 tab by mouth qid for 7 days.  #28 x 0   Entered and Authorized by:   Julieanne Manson MD   Signed by:   Julieanne Manson MD on 10/10/2007   Method used:   Print then Give to Patient   RxID:   [id number]  ]

## 2010-03-16 NOTE — Letter (Signed)
Summary: Lipid Letter  [facility]  [address] [place], [place] [address]   Phone: [phone]  Fax: [phone]    12/26/2007  Gwyn Mehring [address] [place], [place]  [address]  Dear [name]:  We have carefully reviewed your last lipid profile from 12/12/2007 and the results are noted below with a summary of recommendations for lipid management.    Cholesterol:       235     Goal: <200   HDL "good" Cholesterol:   46     Goal: >45   LDL "bad" Cholesterol:   169     Goal: <100   Triglycerides:       99     Goal: <150    I would like you to work on your diet as your bad and total cholesterol are too high.  Avoid fried and fatty foods, red meats.  Eat lots (3 servings daily) of veggies and 2 servings of fruit daily.  If you would like to meet with our nutritionist, that can be set up.   Please call and schedule a fasting lab visit in 4 months to recheck your cholesterol.    TLC Diet (Therapeutic Lifestyle Change): Saturated Fats & Transfatty acids should be kept < 7% of total calories ***Reduce Saturated Fats Polyunstaurated Fat can be up to 10% of total calories Monounsaturated Fat Fat can be up to 20% of total calories Total Fat should be no greater than 25-35% of total calories Carbohydrates should be 50-60% of total calories Protein should be approximately 15% of total calories Fiber should be at least 20-30 grams a day ***Increased fiber may help lower LDL Total Cholesterol should be < 200mg /day Consider adding plant stanol/sterols to diet (example: Benacol spread) ***A higher intake of unsaturated fat may reduce Triglycerides and Increase HDL    Adjunctive Measures (may lower LIPIDS and reduce risk of Heart Attack) include: Aerobic Exercise (20-30 minutes 3-4 times a week) Limit Alcohol Consumption Weight Reduction Aspirin 75-81 mg a day by mouth (if not allergic or contraindicated) Vitamin E 400 IU a day by mouth Folic Acid 1mg  a day by mouth Dietary Fiber  20-30 grams a day by mouth     Current Medications: 1)    Zoloft 100 Mg Tabs (Sertraline hcl) .... 1/2 tab by mouth daily for 7 days, then 1 tab daily thereafter. 2)    Trazodone Hcl 50 Mg Tabs (Trazodone hcl) .... 3 tabs by mouth q hs 3)    Dilaudid 4 Mg Tabs (Hydromorphone hcl) .[name].. 1 tab by mouth two times a day as needed pain 4)    Ultrase Mt 20 65-20-65 Mu Cpep (Amylase-lipase-protease) .[name].. 1 cap by mouth three times a day with each meal. 5)    Cephalexin 250 Mg Tabs (Cephalexin) .[name].. 1 tab by mouth qid for 7 days.  If you have any questions, please call. We appreciate being able to work with you.   Sincerely,    [facility] Julieanne Manson MD

## 2010-03-16 NOTE — Assessment & Plan Note (Signed)
Summary: 3 MONTH FU FOR WEIGHT AND PANCREATITIS//KT   Vital Signs:  Patient profile:   40 year old male Height:      74 inches Weight:      167 pounds BMI:     21.52 Temp:     98.1 degrees F oral Pulse rate:   60 / minute Pulse rhythm:   regular Resp:     16 per minute BP sitting:   100 / 70  (left arm) Cuff size:   large  Vitals Entered By: [place] Shannon (November 18, 2009 2:36 PM) CC: three month f/u... pt says he has right side back pain x a week now... pt says he has not pulled a muscle... Is Patient Diabetic? No Pain Assessment Patient in pain? no       Does patient need assistance? Functional Status Self care Ambulation Normal   CC:  three month f/u... pt says he has right side back pain x a week now... pt says he has not pulled a muscle....  History of Present Illness: 1.  Right back/high flank pain:  Has had for about 1 1/2 weeks.  Noted upon awakening.  No injury or overuse that he can recall.  Lying down on that side makes it worse.  When moves torso from side to side, no increased pain.  No discomfort with deep breath, no worse than usual pain with eating.  No fever or cough.    2.  Medial right knee pain:  noted with walking--started about same time as back pain.  No redness or swelling noted.  When presses on same area, tender as well.  Current Medications (verified): 1)  Zoloft 100 Mg Tabs (Sertraline Hcl) .[name].. 1 Tablet By Mouth Daily 2)  Trazodone Hcl 50 Mg Tabs (Trazodone Hcl) .... 3 Tablets By Mouth Nightly 3)  Ultrase Mt 20 65-20-65 Mu Cpep (Amylase-Lipase-Protease) .[name].. 1 Cap By Mouth Three Times A Day With Each Meal. 4)  Cephalexin 250 Mg Tabs (Cephalexin) .[name].. 1 Tab By Mouth Qid For 7 Days. 5)  Ambien 10 Mg Tabs (Zolpidem Tartrate) .[name].. 1 Tab By Mouth Q Hs As Needed Sleep 6)  Percocet 5-325 Mg Tabs (Oxycodone-Acetaminophen) .[name].. 1 Tab By Mouth Two Times A Day As Needed Severe Pain 7)  Promethazine Hcl 25 Mg Tabs (Promethazine Hcl) .[name].. 1 Tab Every 6  Hours As Needed For Nausea and Vomiting.  Use Suppository If Unable To Keep Down 8)  Promethazine Hcl 25 Mg Supp (Promethazine Hcl) .[name].. 1 Per Rectum If Unable To Keep Oral Med Down--Every 6 Hours As Needed 9)  Flexeril 10 Mg Tabs (Cyclobenzaprine Hcl) .[name].. 1 Tab By Mouth Every 8 Hours As Needed For Back Pain  Allergies (verified): 1)  ! Aspirin (Aspirin) 2)  ! Ibuprofen (Ibuprofen)  Physical Exam  Chest Wall:  Tender with palpable mild muscle spasm just lateral to infero lateral scapula on right.  Increased pain when as pt. to abduct right shoulder and stretch to left. Lungs:  Normal respiratory effort, chest expands symmetrically. Lungs are clear to auscultation, no crackles or wheezes. Heart:  Normal rate and regular rhythm. S1 and S2 normal without gallop, murmur, click, rub or other extra sounds. Extremities:  Mildly decreased flexion of right knee, full extension.  No effusion, erythema or temp. to knee.  Tender over medial tendons to tibia.  No discomfort or laxity on stress of medial or lateral collateral ligaments, anterior/posterior cruciates.  NT along joint margin and posterior knee.   Impression &  Recommendations:  Problem # 1:  MUSCLE STRAIN, RIGHT FLANK (ICD-848.9) Tylenol or Percocet every 4-6 hours--to really avoid using his percocet Cyclobenzaprine as needed for short course.  Problem # 2:  KNEE PAIN, RIGHT (ICD-719.46) Feel this is a tendinitis--treatment as above. His updated medication list for this problem includes:    Percocet 5-325 Mg Tabs (Oxycodone-acetaminophen) .[name]... 1 tab by mouth two times a day as needed severe pain    Flexeril 10 Mg Tabs (Cyclobenzaprine hcl) .[name]... 1 tab by mouth every 8 hours as needed for back pain  Complete Medication List: 1)  Zoloft 100 Mg Tabs (Sertraline hcl) .[name].. 1 tablet by mouth daily 2)  Trazodone Hcl 50 Mg Tabs (Trazodone hcl) .... 3 tablets by mouth nightly 3)  Ultrase Mt 20 65-20-65 Mu Cpep (Amylase-lipase-protease) .[name].. 1  cap by mouth three times a day with each meal. 4)  Cephalexin 250 Mg Tabs (Cephalexin) .[name].. 1 tab by mouth qid for 7 days. 5)  Ambien 10 Mg Tabs (Zolpidem tartrate) .[name].. 1 tab by mouth q hs as needed sleep 6)  Percocet 5-325 Mg Tabs (Oxycodone-acetaminophen) .[name].. 1 tab by mouth two times a day as needed severe pain 7)  Promethazine Hcl 25 Mg Tabs (Promethazine hcl) .[name].. 1 tab every 6 hours as needed for nausea and vomiting.  use suppository if unable to keep down 8)  Promethazine Hcl 25 Mg Supp (Promethazine hcl) .[name].. 1 per rectum if unable to keep oral med down--every 6 hours as needed 9)  Flexeril 10 Mg Tabs (Cyclobenzaprine hcl) .[name].. 1 tab by mouth every 8 hours as needed for back pain  Patient Instructions: 1)  Call if change in pain/worsening/new concerning symptoms Prescriptions: FLEXERIL 10 MG TABS (CYCLOBENZAPRINE HCL) 1 tab by mouth every 8 hours as needed for back pain  #20 x 0   Entered and Authorized by:   Julieanne Manson MD   Signed by:   Julieanne Manson MD on 11/18/2009   Method used:   Print then Give to Patient   RxID:   [id number] PERCOCET 5-325 MG TABS (OXYCODONE-ACETAMINOPHEN) 1 tab by mouth two times a day as needed severe pain  #30 x 0   Entered and Authorized by:   Julieanne Manson MD   Signed by:   Julieanne Manson MD on 11/18/2009   Method used:   Print then Give to Patient   RxID:   [id number]   Appended Document: 3 MONTH FU FOR WEIGHT AND PANCREATITIS//KT     Allergies: 1)  ! Aspirin (Aspirin) 2)  ! Ibuprofen (Ibuprofen)   Complete Medication List: 1)  Zoloft 100 Mg Tabs (Sertraline hcl) .[name].. 1 tablet by mouth daily 2)  Trazodone Hcl 50 Mg Tabs (Trazodone hcl) .... 3 tablets by mouth nightly 3)  Ultrase Mt 20 65-20-65 Mu Cpep (Amylase-lipase-protease) .[name].. 1 cap by mouth three times a day with each meal. 4)  Cephalexin 250 Mg Tabs (Cephalexin) .[name].. 1 tab by mouth qid for 7 days. 5)  Ambien 10 Mg Tabs (Zolpidem tartrate) .[name].. 1 tab  by mouth q hs as needed sleep 6)  Percocet 5-325 Mg Tabs (Oxycodone-acetaminophen) .[name].. 1 tab by mouth two times a day as needed severe pain 7)  Promethazine Hcl 25 Mg Tabs (Promethazine hcl) .[name].. 1 tab every 6 hours as needed for nausea and vomiting.  use suppository if unable to keep down 8)  Promethazine Hcl 25 Mg Supp (Promethazine hcl) .[name].. 1 per rectum if unable to keep oral med down--every 6 hours as  needed 9)  Flexeril 10 Mg Tabs (Cyclobenzaprine hcl) .[name].. 1 tab by mouth every 8 hours as needed for back pain  Other Orders: Flu Vaccine 81yrs + (16109) Admin 1st Vaccine (60454) Admin 1st Vaccine [facility]) [phone])    Influenza Vaccine    Vaccine Type: Fluvax 3+    Site: right deltoid    Mfr: GlaxoSmithK[employer]    Dose: 0.5 ml    Route: IM    Given by: Levon Hedger    Exp. Date: 07/2010    Lot #: [id number]    VIS given: 09/06/09 version given November 18, 2009.  Flu Vaccine Consent Questions    Do you have a history of severe allergic reactions to this vaccine? no    Any prior history of allergic reactions to egg and/or gelatin? no    Do you have a sensitivity to the preservative Thimersol? no    Do you have a past history of Guillan-Barre Syndrome? no    Do you currently have an acute febrile illness? no    Have you ever had a severe reaction to latex? no    Vaccine information given and explained to patient? yes    ndc  [phone]

## 2010-03-16 NOTE — Assessment & Plan Note (Signed)
Summary: CANT TAKE PERCOCET///KT   Vital Signs:  Patient profile:   40 year old male Weight:      197.1 pounds Temp:     97.5 degrees F oral Pulse rate:   82 / minute Pulse rhythm:   regular Resp:     18 per minute BP sitting:   112 / 70  (left arm) Cuff size:   large  Vitals Entered By: Geanie Cooley  (March 11, 2009 12:06 PM) CC: Pt states that the Percocet makes him have sharp pains in his right pectoris area. Pt states that he would like to switch from the Perocoet to Dilaudud. Pt states that he is having alot of pain in his stomach. Is Patient Diabetic? No Pain Assessment Patient in pain? yes     Location: abdomen Intensity: 10 Type: dull/sharp  Does patient need assistance? Functional Status Self care Ambulation Normal   CC:  Pt states that the Percocet makes him have sharp pains in his right pectoris area. Pt states that he would like to switch from the Perocoet to Dilaudud. Pt states that he is having alot of pain in his stomach..  History of Present Illness: 1.  Abdominal Pain:  Wants to switch pain med to something else as he feels the Percocet is causing him to have pain in his right chest.  Occurred last 2 doses of Percocet.  Started about 30 minutes after took pain med.  Pt. initially stated he could not recall when he last went to ED--stated he had not received pain meds anywhere else.  Documents from ED yesterday and the 22nd of January show he tested positive for marijuana metabolites on the 27th as well as getting a prescription for Vicodin, and received pain meds in the ED.  Pt. states he did not fill the Vicodin.  Discussed at length that he broke his pain contract.  Showed him the agreement and what had been broken.  Pt. left without further treatment.  Allergies (verified): 1)  ! Aspirin (Aspirin) 2)  ! Ibuprofen (Ibuprofen)  Physical Exam  General:  NAD   Impression & Recommendations:  Problem # 1:  ABDOMINAL PAIN, EPIGASTRIC  (ICD-789.06)  Complete Medication List: 1)  Zoloft 100 Mg Tabs (Sertraline hcl) .[name].. 1 tablet by mouth daily 2)  Trazodone Hcl 50 Mg Tabs (Trazodone hcl) .... 3 tablets by mouth nightly 3)  Ultrase Mt 20 65-20-65 Mu Cpep (Amylase-lipase-protease) .[name].. 1 cap by mouth three times a day with each meal. 4)  Cephalexin 250 Mg Tabs (Cephalexin) .[name].. 1 tab by mouth qid for 7 days. 5)  Ambien 10 Mg Tabs (Zolpidem tartrate) .[name].. 1 tab by mouth q hs as needed sleep 6)  Percocet 5-325 Mg Tabs (Oxycodone-acetaminophen) .[name].. 1 tab by mouth two times a day as needed severe pain 7)  Promethazine Hcl 25 Mg Tabs (Promethazine hcl) .[name].. 1 tab every 6 hours as needed for nausea and vomiting.  use suppository if unable to keep down 8)  Promethazine Hcl 25 Mg Supp (Promethazine hcl) .[name].. 1 per rectum if unable to keep oral med down--every 6 hours as needed 9)  Flexeril 10 Mg Tabs (Cyclobenzaprine hcl) .[name].. 1 tab by mouth every 8 hours as needed for back pain   Vital Signs:  Patient profile:   40 year old male Weight:      197.1 pounds Temp:     97.5 degrees F oral Pulse rate:   82 / minute Pulse rhythm:   regular Resp:  18 per minute BP sitting:   112 / 70  (left arm) Cuff size:   large  Vitals Entered By: Geanie Cooley  (March 11, 2009 12:06 PM)  Laboratory Results   Urine Tests  Date/Time Received: March 11, 2009 12:39 PM]  Routine Urinalysis   Color: lt. yellow Glucose: negative   (Normal Range: Negative) Bilirubin: negative   (Normal Range: Negative) Ketone: negative   (Normal Range: Negative) Spec. Gravity: 1.015   (Normal Range: 1.003-1.035) Blood: negative   (Normal Range: Negative) pH: 7.5   (Normal Range: 5.0-8.0) Protein: negative   (Normal Range: Negative) Urobilinogen: 1.0   (Normal Range: 0-1) Nitrite: negative   (Normal Range: Negative) Leukocyte Esterace: negative   (Normal Range: Negative)

## 2010-03-16 NOTE — Progress Notes (Signed)
Summary: Record request from [employer] [facility] [facility]  Request for records received from [employer] [facility]. Request forwarded to [employer]. Dena Chavis  March 21, 2009 4:13 PM

## 2010-03-16 NOTE — Progress Notes (Signed)
Summary: refill on med   Phone Note Call from Patient Call back at Home Phone [phone]   Caller: Patient Call For: [phone] Summary of Call: The patient needs more refills from his Dilaudid 4 mg.  According to him, the pharmacy request him to call to his provider so the clinic can send the request.  Nicolette Bang Phar [phone] Dr. Delrae Alfred Initial call taken by: Manon Hilding,  November 14, 2007 2:35 PM  Follow-up for Phone Call        pt last got 30 on 10/10/07 on Dilaudid 4 mg Follow-up by: Vesta Mixer CMA,  November 14, 2007 2:56 PM  Additional Follow-up for Phone Call Additional follow up Details #1::        Did his old records come in yet? Additional Follow-up by: Julieanne Manson MD,  November 16, 2007 11:08 AM    Additional Follow-up for Phone Call Additional follow up Details #2::    No, however pt called and a little upset he hasn't got med yet, he said he had a copy of all his records as well as his lawyer.  He will either fax or bring in a copy of them for [place]. Follow-up by: Vesta Mixer CMA,  November 21, 2007 10:52 AM  Additional Follow-up for Phone Call Additional follow up Details #3:: Details for Additional Follow-up Action Taken: The prescription was filled on 11/16/07--did it get faxed? Tried to call pt., but could only leave message--not sure what pharmacy it went to. Julieanne Manson MD  November 21, 2007 6:20 PM  I have not seen Rx.  It is not in file up front nor has pt came in to sign for it.  I checked the faxes to make sure it wasn't faxed by mistake, but did not see it there neither...................... Tiffany McCoy CMA  November 24, 2007 12:04 PM   Pt came in and picked up script. Double checked with Velna Hatchet she said she did fax not knowing it could not be, I checked at pharmacy to make sure they did not fill and they didn't.  Left message for pt to call so he can pick up rx........... Vesta Mixer CMA  November 27, 2007 3:27 PM  Additional Follow-up  by: Vesta Mixer CMA,  November 27, 2007 5:18 PM    Prescriptions: DILAUDID 4 MG TABS (HYDROMORPHONE HCL) 1 tab by mouth two times a day as needed pain  #30 x 0   Entered and Authorized by:   Julieanne Manson MD   Signed by:   Julieanne Manson MD on 11/25/2007   Method used:   Print then Give to Patient   RxID:   [id number] DILAUDID 4 MG TABS (HYDROMORPHONE HCL) 1 tab by mouth two times a day as needed pain  #30 x 0   Entered and Authorized by:   Julieanne Manson MD   Signed by:   Julieanne Manson MD on 11/16/2007   Method used:   Printed then faxed to ...         RxID:   [id number]  Please check with Velna Hatchet before calling pt. and having him pick up--make sure did not get faxed somewhere.  Pt. needs to bring in old records for [place] to copy when he comes in to pick up med.

## 2010-03-16 NOTE — Procedures (Signed)
Summary: Gastroenterology EGD  Gastroenterology EGD   Imported By: Milford Cage CMA 04/18/2007 13:09:06  _____________________________________________________________________  External Attachment:    Type:   Image     Comment:   External Document

## 2010-03-16 NOTE — Assessment & Plan Note (Signed)
Summary: ankle injury /tmm   Vital Signs:  Patient profile:   40 year old male Pulse rate:   69 / minute Pulse rhythm:   regular Resp:     16 per minute BP sitting:   115 / 73  (right arm) Cuff size:   regular  Vitals Entered By: Vesta Mixer CMA (February 09, 2009 10:28 AM) CC: pt fell Saturday and hurt ankle went to ED still in a lot of pain Is Patient Diabetic? No Pain Assessment Patient in pain? yes     Location: left ankle Intensity: 10  Does patient need assistance? Ambulation Impaired:Risk for fall   CC:  pt fell Saturday and hurt ankle went to ED still in a lot of pain.  History of Present Illness: Twisted left ankle stepping off a curve on 12/25.  Was seen in ED on 27th secondary to pain.  Xrays were negative for acute bony injury to ankle or foot.  Was given air cast ankle splint and crutches.  Was given percocet for pain.  Has been applying ice mainly at night.  Sitting around most of day and trying to keep foot up.  Now having pain in left buttock--states when twisted ankle, fell to ground and landed on left buttock.  No numbness, tingling, or weakness in left leg.  Allergies: 1)  ! Aspirin (Aspirin) 2)  ! Ibuprofen (Ibuprofen)  Physical Exam  Msk:  Mild tenderness over lumbar spinous processes.  NT over paraspinous musculature.  Mild swelling over lateral ankle.  NT over distal fibula .  Fair range of motion.  Ankle mortise is stable and without discomfort. Neurologic:  DTRs symmetrical and normal.   Normal strength in left lower extrem.   Impression & Recommendations:  Problem # 1:  BACK STRAIN, LUMBAR (ICD-847.2) Add muscle relaxant--no more percocet for this  Problem # 2:  ANKLE SPRAIN, LEFT (ICD-845.00) To continue treatment as per ED His updated medication list for this problem includes:    Percocet 5-325 Mg Tabs (Oxycodone-acetaminophen) .[name]... 1 tab by mouth two times a day as needed severe pain  Complete Medication List: 1)  Zoloft 100 Mg  Tabs (Sertraline hcl) .[name].. 1 tablet by mouth daily 2)  Trazodone Hcl 50 Mg Tabs (Trazodone hcl) .... 3 tablets by mouth nightly 3)  Ultrase Mt 20 65-20-65 Mu Cpep (Amylase-lipase-protease) .[name].. 1 cap by mouth three times a day with each meal. 4)  Cephalexin 250 Mg Tabs (Cephalexin) .[name].. 1 tab by mouth qid for 7 days. 5)  Ambien 10 Mg Tabs (Zolpidem tartrate) .[name].. 1 tab by mouth q hs as needed sleep 6)  Percocet 5-325 Mg Tabs (Oxycodone-acetaminophen) .[name].. 1 tab by mouth two times a day as needed severe pain 7)  Promethazine Hcl 25 Mg Tabs (Promethazine hcl) .[name].. 1 tab every 6 hours as needed for nausea and vomiting.  use suppository if unable to keep down 8)  Promethazine Hcl 25 Mg Supp (Promethazine hcl) .[name].. 1 per rectum if unable to keep oral med down--every 6 hours as needed 9)  Flexeril 10 Mg Tabs (Cyclobenzaprine hcl) .[name].. 1 tab by mouth every 8 hours as needed for back pain  Patient Instructions: 1)  Keep foot elevated and ice every 2 hours for about 20 minutes until swelling down--follow advice of gradual weight bearing as per ER instructions Prescriptions: FLEXERIL 10 MG TABS (CYCLOBENZAPRINE HCL) 1 tab by mouth every 8 hours as needed for back pain  #20 x 0   Entered and Authorized by:  Julieanne Manson MD   Signed by:   Julieanne Manson MD on 02/09/2009   Method used:   Print then Give to Patient   RxID:   [id number]

## 2010-03-16 NOTE — Progress Notes (Signed)
Summary: med refills   Phone Note Call from Patient Call back at Home Phone [phone]   Summary of Call: Mr Jabs called in because he needs his medical refills from his pain med percocet. [place] MD Initial call taken by: Manon Hilding,  December 31, 2008 8:32 AM  Follow-up for Phone Call        Last got #30 on 10/25. Follow-up by: Vesta Mixer CMA,  December 31, 2008 3:44 PM  Additional Follow-up for Phone Call Additional follow up Details #1::        Will refill tomorrow and can pick up Wednesday Additional Follow-up by: Julieanne Manson MD,  January 03, 2009 1:53 PM    Prescriptions: PERCOCET 5-325 MG TABS (OXYCODONE-ACETAMINOPHEN) 1 tab by mouth two times a day as needed severe pain  #30 x 0   Entered and Authorized by:   Julieanne Manson MD   Signed by:   Julieanne Manson MD on 01/04/2009   Method used:   Print then Give to Patient   RxID:   [id number]

## 2010-03-16 NOTE — Letter (Signed)
Summary: [employer]  [employer]   Imported By: Leodis Rains 06/17/2008 17:23:23  _____________________________________________________________________  External Attachment:    Type:   Image     Comment:   External Document

## 2010-03-16 NOTE — Letter (Signed)
Summary: AGREEMENT FOR CONTROLLED PRESCRIPTION  AGREEMENT FOR CONTROLLED PRESCRIPTION   Imported By: Arta Bruce 08/17/2009 15:55:22  _____________________________________________________________________  External Attachment:    Type:   Image     Comment:   External Document

## 2010-03-16 NOTE — Progress Notes (Signed)
Summary: NEEDS RX SCRIPT   Phone Note Call from Patient Call back at Home Phone [phone]   Reason for Call: Refill Medication Summary of Call: [name] PT. MR. Hendler SAYS THAT HE IS OUT OF HIS DILAUDID AND NEEDS A RX.  HE IS ACTUALLY SCHEDULED TO SEE YOU 03/12 NOT 02/12.  I'M SORRY [name], I PUT ON THE LAST PHONE NOTE THAT HE WAS SCHEDULED TO SEE YOU ON 02/12 AND THAT WAS WRONG. Initial call taken by: Leodis Rains,  March 29, 2008 2:33 PM  Follow-up for Phone Call        Robert pt.  will send to her for review. looks like it is due 04/07/2008 n.martin, fnp      Prescriptions: DILAUDID 4 MG TABS (HYDROMORPHONE HCL) 1 tab by mouth two times a day as needed pain  #30 x 0   Entered by:   Julieanne Manson MD   Authorized by:   Levon Hedger   Signed by:   Julieanne Manson MD on 04/07/2008   Method used:   Print then Give to Patient   RxID:   [id number]   Appended Document: NEEDS RX SCRIPT     Prescriptions: DILAUDID 4 MG TABS (HYDROMORPHONE HCL) 1 tab by mouth two times a day as needed pain  #30 x 0   Entered and Authorized by:   Julieanne Manson MD   Signed by:   Julieanne Manson MD on 04/07/2008   Method used:   Print then Give to Patient   RxID:   [id number]  Reprinted as wrong name under signature

## 2010-03-16 NOTE — Procedures (Signed)
Summary: Gastroenterology Colon  Gastroenterology [facility]   Imported By: Milford Cage CMA 04/18/2007 13:08:29  _____________________________________________________________________  External Attachment:    Type:   Image     Comment:   External Document

## 2010-03-16 NOTE — Procedures (Signed)
Summary: Gastroenterology EUS  Gastroenterology EUS   Imported By: Milford Cage CMA 04/18/2007 13:11:19  _____________________________________________________________________  External Attachment:    Type:   Image     Comment:   External Document

## 2010-03-16 NOTE — Progress Notes (Signed)
Summary: Refill request/Medication change request   Phone Note Call from Patient Call back at Home Phone [phone]   Summary of Call: Mr. Cerveny wants to get more refills from his oxycodone medication.  Also, he wants to inform the doctor that the medication he was taking for his leg pain make his stomach hurt "cyzlobenzatr".  Please call him back.  [facility]) Delrae Alfred MD Initial call taken by: Manon Hilding,  February 15, 2009 2:15 PM  Follow-up for Phone Call        last rx 12/21 #30 oxycodone...Marland KitchenMarland Kitchen  pt states that the medication cyclobencaprine that he is taking for a muscle relaxer is making him have stomach pains. Pt states got on 12/29 and has taking 4-5 pills total but has stopped since stomach pains. pt states still having the pain in his ankle and foot.   Follow-up by: Mikey College CMA,  February 15, 2009 3:32 PM  Additional Follow-up for Phone Call Additional follow up Details #1::        The Flexeril was not to be taken for his ankle pain, but for his back pain. His ankle is going to hurt for a while--he needs to keep elevated and iced as we discussed if it is hurting so much. His oxycodone will be refilled on 03/12/08--as per his pain contract--he already received extra from the ED and should not require an early refill. Additional Follow-up by: Julieanne Manson MD,  February 17, 2009 5:53 PM    Additional Follow-up for Phone Call Additional follow up Details #2::    MR Krawiec SAYS THAT THE ED DID NOT GIVE HIM ANY PAIN MEDICINE AND THEY TOLD HIM TO TAKE 500MG  OF TYLENOL FOR HIS ANKLE. HE SAYS THAT THE FLEXERIL MAD HIS STOMACH HURT. Follow-up by: Leodis Rains,  February 21, 2009 11:10 AM

## 2010-03-16 NOTE — Letter (Signed)
Summary: MAILED REQUESTED RECORDS TO [facility]  MAILED REQUESTED RECORDS TO [facility]   Imported By: Arta Bruce 03/25/2009 14:49:16  _____________________________________________________________________  External Attachment:    Type:   Image     Comment:   External Document

## 2010-03-16 NOTE — Letter (Signed)
Summary: AGREEMENT FOR CONTROLLED SUBSTANCE PRESCRIPTIONS  AGREEMENT FOR CONTROLLED SUBSTANCE PRESCRIPTIONS   Imported By: Arta Bruce 05/28/2008 09:38:55  _____________________________________________________________________  External Attachment:    Type:   Image     Comment:   External Document

## 2010-03-16 NOTE — Progress Notes (Signed)
Summary: dilaudid 4 mg refills request   Phone Note Call from Patient Call back at Home Phone [phone]   Caller: Patient Summary of Call: The pt is requesting for more refills from his dilaudid 4 mg medication. Dr. Delrae Alfred Initial call taken by: Manon Hilding,  January 22, 2008 2:58 PM  Follow-up for Phone Call        Pt last received #30 on 11/25/07. Follow-up by: Vesta Mixer CMA,  January 22, 2008 3:31 PM      Prescriptions: DILAUDID 4 MG TABS (HYDROMORPHONE HCL) 1 tab by mouth two times a day as needed pain  #30 x 0   Entered and Authorized by:   Julieanne Manson MD   Signed by:   Julieanne Manson MD on 01/22/2008   Method used:   Print then Give to Patient   RxID:   [id number]

## 2010-03-16 NOTE — Progress Notes (Signed)
Summary: Has pancreatitis  Phone Note Call from Patient   Summary of Call: PT HAS APPT 03/10/10 TO SEE Kandyce Dieguez(THE SOONESS THING WE HAD)HE HAS PANCRENATTIS AND IS HAVING TROUBLE KEEPING FOOD DOWN,WOULD LIKE FOR YOU TO GIVE HIM A CALL BACK TO SEE IF THERE  IS SOMETHING HE CAN BE DOING TO HELP SOME, BEFORE HIS APPT/ CAN BE REACHED @[phone] Initial call taken by: Arta Bruce,  February 09, 2010 2:25 PM  Follow-up for Phone Call        Left message with male for pt. to return call.  Dutch Quint RN  February 09, 2010 3:32 PM  Vomiting half of the night, diarrhea, hurting in his back, all the way to his stomach, like he has a knife stuck in him.  Having hot and cold flashes, diaphoretic, lightheaded.  Has not had promethazine for his use.  Also believes that his body has tolerance to percocet- no longer effective.  Wants medications for nausea/vomiting and pain.  [facility] [address]  Additional Follow-up for Phone Call Additional follow up Details #1::        Discussed with Dr. Delrae Alfred -- pain management issues need to be addressed at next visit.  He can call to check for cancellations, but there is nothing sooner at this time.  Promethazine tablets and suppositories Rx [facility] [address] for nausea/vomiting.  Dutch Quint RN  February 09, 2010 4:31 PM  Pt. notified of provider's response, Rxs and to call for cancellations - verbalized understanding and agreement.  Dutch Quint RN  February 09, 2010 4:40 PM     Prescriptions: PROMETHAZINE HCL 25 MG SUPP (PROMETHAZINE HCL) 1 per rectum if unable to keep oral med down--every 6 hours as needed  #10 x 0   Entered by:   Dutch Quint RN   Authorized by:   Julieanne Manson MD   Signed by:   Dutch Quint RN on 02/09/2010   Method used:   [facility]        [facility] [address] [phone]* (retail)       [address]       [place], [place]  [address]       Ph: [phone] or [phone]  Fax: [phone]   RxID:   [id number] PROMETHAZINE HCL 25 MG TABS (PROMETHAZINE HCL) 1 tab every 6 hours as needed for nausea and vomiting.  Use suppository if unable to keep down  #20 x 0   Entered by:   Dutch Quint RN   Authorized by:   Julieanne Manson MD   Signed by:   Dutch Quint RN on 02/09/2010   Method used:   [facility]        [facility] [address] [phone]* (retail)       [address]       [place], [place]  [address]       Ph: [phone] or [phone]       Fax: [phone]   RxID:   [id number]

## 2010-03-16 NOTE — Letter (Signed)
Summary: *HSN Results Follow up  [facility]  [address] [place], [place] [address]   Phone: [phone]  Fax: [phone]      09/27/2008   [facility] [name] BALDWIN [address] [place], [place]  [address]   Dear  [name],                            ____S.Drinkard,FNP   ____D. Gore,FNP       ____B. McPherson,MD   ____V. Rankins,MD    ____E. Mulberry,MD    ____N. Daphine Deutscher, FNP  ____D. Reche Dixon, MD    ____K. Philipp Deputy, MD    ____Other     This letter is to inform you that your recent test(s):  _______Pap Smear    _______Lab Test     _______X-ray    _______ is within acceptable limits  _______ requires a medication change  _______ requires a follow-up lab visit  _______ requires a follow-up visit with your provider   Comments:  We have been trying to contact you at [phone].  Please contact the office at your earliest convenience.       _________________________________________________________ If you have any questions, please contact our office                     Sincerely,  Levon Hedger [facility]
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
## 2010-03-16 NOTE — Progress Notes (Signed)
Summary: Pain refills med   Phone Note Call from Patient   Summary of Call: Mr. Thurman needs more medical refills from his percocet medication.  Please call him back when is ready Mulberry MD  Initial call taken by: Manon Hilding,  January 26, 2009 8:14 AM  Follow-up for Phone Call        Last got #30 on 01/04/09. Follow-up by: Vesta Mixer CMA,  January 26, 2009 11:50 AM  Additional Follow-up for Phone Call Additional follow up Details #1::        Will fill next Tuesday. Additional Follow-up by: Julieanne Manson MD,  January 27, 2009 11:48 AM    Prescriptions: PERCOCET 5-325 MG TABS (OXYCODONE-ACETAMINOPHEN) 1 tab by mouth two times a day as needed severe pain  #30 x 0   Entered and Authorized by:   Julieanne Manson MD   Signed by:   Julieanne Manson MD on 02/01/2009   Method used:   Print then Give to Patient   RxID:   [id number]

## 2010-03-16 NOTE — Assessment & Plan Note (Signed)
Summary: CPE PER MULBERRY//AO   Vital Signs:  Patient Profile:   40 Years Old Male Height:     74 inches Weight:      216 pounds Temp:     97.5 degrees F oral Pulse rate:   78 / minute Pulse rhythm:   regular Resp:     20 per minute BP sitting:   110 / 68  (left arm) Cuff size:   large  Pt. in pain?   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  Has not received the flu shot ever. STE:  No PSA:  Never Guaiac cards:  negative in past--2008 when followed by Dr. Clarene Duke Colonoscopy: 2007--cannot recall why done, thinks it was normal EGD:  2007--To evaluate for pancreatitis.  Biopsies done--thinks they were normal.       Chief Complaint:  CPE pt said also he is been sick on his stomach and has been  vomiting for couple of days and he didn't take any medications for that.  History of Present Illness: 40 yo male here for CPE  Concerns:    1.  Nausea and vomiting for 3-4 days.  Able to keep chicken broth down.  Has tried apple juice and cranberry juice.  Taking pancreatic enzymes.  Does take the edge off of pain--allows hime to cope with the pain.   No evidence in emr that releases of info sent to previous caregivers.     Prior Medications Reviewed Using: Medication Bottles  Current Allergies: No known allergies   Past Medical History:    ALCOHOLISM (ICD-303.90)    PANCREATITIS, CHRONIC (ICD-577.1)    NARCOTIC ABUSE (ICD-305.90)        Risk Factors:     Year started:  started age 77.    Comments:  once monthly--uses when cannot eat. Seatbelt use:  100 %   Review of Systems  General      Energy in general fair.  Eyes      Denies blurring.  ENT      Denies decreased hearing.  CV      Denies chest pain or discomfort and shortness of breath with exertion.  Resp  Denies shortness of breath.  GI      Denies bloody stools, constipation, and dark tarry stools.      Stools can be loose--more formed since back on pancreatic enzymes.  GU      Denies erectile dysfunction.      No hx of STD  MS      Denies joint pain, joint redness, and joint swelling.  Derm      Denies lesion(s) and rash.  Neuro      Denies numbness, tingling, and weakness.  Psych      Still having problems falling asleep.  Not good sleep hygiene.  Depression somewhat better since back on Zoloft--still with a fair amt. of anxiety.  Has been back on meds for about 8 weeks.   Physical Exam  General:     Well-developed,well-nourished,in no acute distress; alert,appropriate and cooperative throughout examination.  Somewhat unkempt Head:     Normocephalic and atraumatic without obvious abnormalities. No apparent alopecia  or balding. Eyes:     No corneal or conjunctival inflammation noted. EOMI. Perrla. Funduscopic exam benign, without hemorrhages, exudates or papilledema. Vision grossly normal. Ears:     External ear exam shows no significant lesions or deformities.  Otoscopic examination reveals clear canals, but dry, tympanic membranes are intact bilaterally without bulging, retraction, inflammation or discharge. Hearing is grossly normal bilaterally. Nose:     External nasal examination shows no deformity or inflammation. Nasal mucosa are pink and moist without lesions or exudates. Mouth:     Oral mucosa and oropharynx without lesions or exudates.  fair dentition.   Neck:     No deformities, masses, or tenderness noted. Chest Wall:     No deformities, masses, tenderness or gynecomastia noted. Lungs:     Normal respiratory effort, chest expands symmetrically. Lungs are clear to auscultation, no crackles or wheezes. Heart:     Normal rate and regular rhythm. S1 and S2 normal without gallop, murmur, click, rub or other extra sounds. Abdomen:     Bowel sounds positive,abdomen  soft and  without masses, organomegaly or hernias noted.  Mild tenderness actually in left mid abdomen, no peritoneal signs or rebound Genitalia:     Testes bilaterally descended without nodularity, tenderness or masses. No scrotal masses or lesions. No penis lesions or urethral discharge. Msk:     No deformity or scoliosis noted of thoracic or lumbar spine.    Pulses:     R and L carotid,radial,femoral,dorsalis pedis and posterior tibial pulses are full and equal bilaterally Extremities:     No clubbing, cyanosis, edema, or deformity noted with normal full range of motion of all joints.  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 Neurologic:     No cranial nerve deficits noted. Station and gait are normal. Plantar reflexes are down-going bilaterally. DTRs are symmetrical throughout. Sensory, motor and coordinative functions appear intact. Skin:     Intact without suspicious lesions or rashes Cervical Nodes:     No lymphadenopathy noted Axillary Nodes:     No palpable lymphadenopathy Inguinal Nodes:     No significant adenopathy Psych:     Cognition and judgment appear intact. Alert and cooperative with normal attention span and concentration. No apparent delusions, illusions, hallucinations    Impression & Recommendations:  Problem # 1:  HEALTH MAINTENANCE EXAM (ICD-V70.0) Recommend monthly STE Flu shot today Pneumovax today--pt. with asthma Orders: UA Dipstick w/o Micro (manual) ([address]) T-Lipid Profile ([id number]) T-Comprehensive Metabolic Panel ([id number])   Problem # 2:  DEPRESSION, CHRONIC (ICD-311) INcrease Trazodone to 150 mg. Encouraged counseling--pt. to contemplate His updated medication list for this problem includes:    Zoloft 100 Mg Tabs (Sertraline hcl) .[name]... 1/2 tab by mouth daily for 7 days, then 1 tab daily thereafter.    Trazodone Hcl 50 Mg Tabs (Trazodone hcl) .Marland KitchenMarland KitchenMarland KitchenMarland Kitchen 3 tabs by mouth q hs   Problem # 3:  INSOMNIA, CHRONIC  (ICD-307.42) INcrease Trazodone to 150 mg q hs Better lifestyle and sleep hygiene discussed at length  Problem # 4:  PANCREATITIS, CHRONIC (ICD-577.1) Some difficulties currently.   Appears hydrated. Orders: T-Comprehensive Metabolic Panel [id number]) T-CBC w/Diff ([id number])   Complete Medication List: 1)  Zoloft 100 Mg Tabs (Sertraline hcl) .... 1/2 tab by mouth daily for 7 days, then 1 tab daily thereafter. 2)  Trazodone Hcl 50 Mg Tabs (Trazodone hcl) .... 3 tabs by mouth q hs 3)  Dilaudid 4 Mg Tabs (Hydromorphone hcl) .[name].. 1 tab by mouth two times  a day as needed pain 4)  Ultrase Mt 20 65-20-65 Mu Cpep (Amylase-lipase-protease) .[name].. 1 cap by mouth three times a day with each meal. 5)  Cephalexin 250 Mg Tabs (Cephalexin) .[name].. 1 tab by mouth qid for 7 days.   Patient Instructions: 1)  Call for follow upwith Dr. Delrae Alfred in 3-4 months   Prescriptions: ULTRASE MT 20 65-20-65 MU CPEP (AMYLASE-LIPASE-PROTEASE) 1 cap by mouth three times a day with each meal.  #90 x 6   Entered and Authorized by:   Julieanne Manson MD   Signed by:   Julieanne Manson MD on 12/12/2007   Method used:   Print then Give to Patient   RxID:   [id number] ZOLOFT 100 MG TABS (SERTRALINE HCL) 1/2 tab by mouth daily for 7 days, then 1 tab daily thereafter.  #30 x 6   Entered and Authorized by:   Julieanne Manson MD   Signed by:   Julieanne Manson MD on 12/12/2007   Method used:   Print then Give to Patient   RxID:   [id number] TRAZODONE HCL 50 MG TABS (TRAZODONE HCL) 3 tabs by mouth q hs  #120 x 6   Entered and Authorized by:   Julieanne Manson MD   Signed by:   Julieanne Manson MD on 12/12/2007   Method used:   Print then Give to Patient   RxID:   [id number]  ] Laboratory Results   Urine Tests  Date/Time Received: December 12, 2007 10:25 AM  Date/Time Reported: December 12, 2007 10:25 AM   Routine Urinalysis   Glucose: negative   (Normal Range:  Negative) Bilirubin: small   (Normal Range: Negative) Ketone: negative   (Normal Range: Negative) Spec. Gravity: >=1.030   (Normal Range: 1.003-1.035) Blood: negative   (Normal Range: Negative) pH: 5.5   (Normal Range: 5.0-8.0) Protein: trace   (Normal Range: Negative) Urobilinogen: 0.2   (Normal Range: 0-1) Nitrite: negative   (Normal Range: Negative) Leukocyte Esterace: negative   (Normal Range: Negative)         Pneumovax Vaccine    Vaccine Type: Pneumovax    Site: left deltoid    Mfr: Merck    Dose: 0.5 ml    Route: IM    Given by: Vesta Mixer CMA    Exp. Date: 01/14/2009    Lot #: [id number]  Influenza Vaccine    Vaccine Type: Fluvax 3+    Site: right deltoid    Mfr: Evans    Dose: 0.5 ml    Route: IM    Given by: Areatha Keas student MA    Exp. Date: 06/30    Lot #: [id number]    VIS given: 09/05/06 version given December 12, 2007.  Flu Vaccine Consent Questions    Do you have a history of severe allergic reactions to this vaccine? no    Any prior history of allergic reactions to egg and/or gelatin? no    Do you have a sensitivity to the preservative Thimersol? no    Do you have a past history of Guillan-Barre Syndrome? no    Do you currently have an acute febrile illness? no    Have you ever had a severe reaction to latex? no    Vaccine information given and explained to patient? yes ndc [id number]

## 2010-03-16 NOTE — Letter (Signed)
Summary: ATTENDING PHYSICIANS STATEMENT  ATTENDING PHYSICIANS STATEMENT   Imported By: Arta Bruce 12/03/2007 11:48:20  _____________________________________________________________________  External Attachment:    Type:   Image     Comment:   External Document

## 2010-03-16 NOTE — Progress Notes (Signed)
Summary: MONTHLY PERCOCET DUE  Phone Note Call from Patient Call back at Home Phone [phone]   Reason for Call: Refill Medication Summary of Call: CALLING FOR HIS MONTHLY REFILL ON HIS PERCOCET Initial call taken by: Leodis Rains,  December 26, 2009 10:13 AM    Prescriptions: PERCOCET 5-325 MG TABS (OXYCODONE-ACETAMINOPHEN) 1 tab by mouth two times a day as needed severe pain  #30 x 0   Entered and Authorized by:   Julieanne Manson MD   Signed by:   Julieanne Manson MD on 12/27/2009   Method used:   Print then Give to Patient   RxID:   [id number]   Appended Document: Pt. notified Rx ready for pickup Pt. notified Rx for Percocet ready to be picked up. Placed in folder up front. Gaylyn Cheers RN  December 27, 2009 11:35 AM    Clinical Lists Changes

## 2010-03-16 NOTE — Letter (Signed)
Summary: *HSN Results Follow up  [facility]  [address] [place], [place] [address]   Phone: [phone]  Fax: [phone]      09/21/2009   [facility] [name] Mizrachi [address] [place], [place]  [address]   Dear  Mr. Oniel Buhman,                            ____S.Drinkard,FNP   ____D. Gore,FNP       ____B. McPherson,MD   ____V. Rankins,MD    __X__E. Haidyn Chadderdon,MD    ____N. Daphine Deutscher, FNP  ____D. Reche Dixon, MD    ____K. Philipp Deputy, MD    ____Other     This letter is to inform you that your recent test(s):  _______Pap Smear    ___X____Lab Test     _______X-ray    ____X___ is within acceptable limits  _______ requires a medication change  _______ requires a follow-up lab visit  _______ requires a follow-up visit with your provider   Comments:  Fasting blood sugar was fine.  If you have some requests regarding your pain meds, we can discuss at follow up visit--you should have one in a couple of months       _________________________________________________________ If you have any questions, please contact our office                     Sincerely,  Julieanne Manson MD [facility]

## 2010-03-16 NOTE — Letter (Signed)
Summary: [place] [employer] AUTHORITY/FAXED  [place] [facility] AUTHORITY/FAXED   Imported By: Arta Bruce 05/28/2008 10:28:38  _____________________________________________________________________  External Attachment:    Type:   Image     Comment:   External Document

## 2010-03-25 ENCOUNTER — Encounter (INDEPENDENT_AMBULATORY_CARE_PROVIDER_SITE_OTHER): Payer: Self-pay | Admitting: Internal Medicine

## 2010-03-30 NOTE — Letter (Signed)
Summary: *HSN Results Follow up  [facility]  [address] [place], [place] [address]   Phone: [phone]  Fax: [phone]      03/25/2010   [name] [address] [place], [place]  [address]   Dear  Mr. [name],                            ____S.Drinkard,FNP   ____D. Gore,FNP       ____B. McPherson,MD   ____V. Rankins,MD    _X__[name],MD    ____N. Daphine Deutscher, FNP  ____D. Reche Dixon, MD    ____K. Philipp Deputy, MD    ____Other     This letter is to inform you that your recent test(s):  _______Pap Smear    ___X____Lab Test     _______X-ray    ___X____ is within acceptable limits  _______ requires a medication change  _______ requires a follow-up lab visit  _______ requires a follow-up visit with your provider  Comments:  Will see how you are doing when you return for recheck.      _________________________________________________________ If you have any questions, please contact our office                     Sincerely,  Julieanne Manson MD [facility]

## 2010-03-30 NOTE — Assessment & Plan Note (Signed)
Summary: PANCRENATTIS//SS   Vital Signs:  Patient profile:   40 year old male Weight:      183.25 pounds Temp:     97.7 degrees F oral Pulse rate:   76 / minute Pulse rhythm:   regular Resp:     16 per minute BP sitting:   100 / 70  (left arm) Cuff size:   regular  Vitals Entered By: Hale Drone CMA (March 07, 2010 10:48 AM) CC: Hosp f/u on pancreatiitis. Abd pain x2 weeks. Feels that percocet has no effect on pain. Also having nauseas and vomiting. There is pain associated when he eats. Will spit the food back up. Pain is described as "knife stabbing".  Is Patient Diabetic? No Pain Assessment Patient in pain? yes     Location: abdomen Intensity: 10 Type: sharp Onset of pain  Constant  Does patient need assistance? Functional Status Self care Ambulation Normal   CC:  Hosp f/u on pancreatiitis. Abd pain x2 weeks. Feels that percocet has no effect on pain. Also having nauseas and vomiting. There is pain associated when he eats. Will spit the food back up. Pain is described as "knife stabbing". .  History of Present Illness: 1. Pancreatitis:  pt. states percocet not affecting pain.  This episode, pt. states, started New Year's Eve.   States a very sharp pain in epigastrium.  Stool is now very black--started 2 days ago.  Not sticky.  Some solid and other parts loose.  Last emesis this morning--brought up watery colored liquid.  No blood or coffee ground emesis.  Has not tried [employer].  Pain goes straight through to back.  Is getting acid into throat every time eats.    Off pancreatic enzymes for at least 1 week.  Started Prilosec otc about 1 week ago--20 mg.  Using two times a day .  Has helped with belching and acid into throat since started.    Using Promethazine before eating--helps.  No definite fever, but feels hot when pain worse.    Current Medications (verified): 1)  Zoloft 100 Mg Tabs (Sertraline Hcl) .[name].. 1 Tablet By Mouth Daily 2)  Trazodone Hcl 50 Mg Tabs  (Trazodone Hcl) .... 4 Tablets By Mouth Nightly 3)  Ultrase Mt 20 65-20-65 Mu Cpep (Amylase-Lipase-Protease) .[name].. 1 Cap By Mouth Three Times A Day With Each Meal. 4)  Cephalexin 250 Mg Tabs (Cephalexin) .[name].. 1 Tab By Mouth Qid For 7 Days. 5)  Ambien 10 Mg Tabs (Zolpidem Tartrate) .[name].. 1 Tab By Mouth Q Hs As Needed Sleep 6)  Percocet 5-325 Mg Tabs (Oxycodone-Acetaminophen) .[name].. 1 Tab By Mouth Two Times A Day As Needed Severe Pain 7)  Promethazine Hcl 25 Mg Tabs (Promethazine Hcl) .[name].. 1 Tab Every 6 Hours As Needed For Nausea and Vomiting.  Use Suppository If Unable To Keep Down 8)  Promethazine Hcl 25 Mg Supp (Promethazine Hcl) .[name].. 1 Per Rectum If Unable To Keep Oral Med Down--Every 6 Hours As Needed 9)  Flexeril 10 Mg Tabs (Cyclobenzaprine Hcl) .[name].. 1 Tab By Mouth Every 8 Hours As Needed For Back Pain  Allergies (verified): 1)  ! Aspirin (Aspirin) 2)  ! Ibuprofen (Ibuprofen)  Physical Exam  General:  Appears to have mild-moderate pain Lungs:  Normal respiratory effort, chest expands symmetrically. Lungs are clear to auscultation, no crackles or wheezes. Heart:  Normal rate and regular rhythm. S1 and S2 normal without gallop, murmur, click, rub or other extra sounds.  Radial pulses normal and equal Abdomen:  Mild to moderate epigastric tenderness--entire epigastrium.  Some diffuse mild tenderness of abdomen as well.  normal bowel sounds, no masses, no rigidity, and no hepatomegaly.   Rectal:  No mass, heme negative green-brown stool   Impression & Recommendations:  Problem # 1:  ABDOMINAL PAIN, GENERALIZED (ICD-789.07) Not clear this is all related to pancreatitis--may be some gastritis/PUD, but no evidence of GI bleeding on exam today, despite possible description of melena. Restart pancreatitic enzymes. Switch to long acting narcotic pain medication has already signed a pain contract Orders: Hemoccult Guaiac-1 spec.(in office) (82270) T-CBC w/Diff (62703-50093) T-Comprehensive  Metabolic Panel [phone]) T-Amylase (96789-38101) T-Lipase (75102-58527)  Complete Medication List: 1)  Zoloft 100 Mg Tabs (Sertraline hcl) .[name].. 1 tablet by mouth daily 2)  Trazodone Hcl 50 Mg Tabs (Trazodone hcl) .... 4 tablets by mouth nightly 3)  Ambien 10 Mg Tabs (Zolpidem tartrate) .[name].. 1 tab by mouth q hs as needed sleep 4)  Percocet 5-325 Mg Tabs (Oxycodone-acetaminophen) .[name].. 1 tab by mouth two times a day as needed severe pain 5)  Promethazine Hcl 25 Mg Tabs (Promethazine hcl) .[name].. 1 tab every 6 hours as needed for nausea and vomiting.  use suppository if unable to keep down 6)  Promethazine Hcl 25 Mg Supp (Promethazine hcl) .[name].. 1 per rectum if unable to keep oral med down--every 6 hours as needed 7)  Flexeril 10 Mg Tabs (Cyclobenzaprine hcl) .[name].. 1 tab by mouth every 8 hours as needed for back pain 8)  Ms Contin 30 Mg Xr12h-tab (Morphine sulfate) .[name].. 1 tab by mouth every 12 hours 9)  Zenpep 20000 Unit Cpep (Pancrelipase (lip-prot-amyl)) .[name].. 1 cap by mouth three times a day with meals  Patient Instructions: 1)  Return in 6 weeks for abdominal pain--Dr. Delrae Alfred 2)  Get restarted on pancreatic enzymes Prescriptions: ZENPEP 20000 UNIT CPEP (PANCRELIPASE (LIP-PROT-AMYL)) 1 cap by mouth three times a day with meals  #90 x 11   Entered and Authorized by:   Julieanne Manson MD   Signed by:   Julieanne Manson MD on 03/15/2010   Method used:   [facility]        [facility] [address] [phone]* (retail)       [address]       [place], [place]  [address]       Ph: [phone] or [phone]       Fax: [phone]   RxID:   [id number] PROMETHAZINE HCL 25 MG SUPP (PROMETHAZINE HCL) 1 per rectum if unable to keep oral med down--every 6 hours as needed  #10 x 0   Entered and Authorized by:   Julieanne Manson MD   Signed by:   Julieanne Manson MD on 03/07/2010   Method used:   [facility]ically to        [facility] [address] [phone]*  (retail)       [address]       [place], [place]  [address]       Ph: [phone] or [phone]       Fax: [phone]   RxID:   [id number] PROMETHAZINE HCL 25 MG TABS (PROMETHAZINE HCL) 1 tab every 6 hours as needed for nausea and vomiting.  Use suppository if unable to keep down  #20 x 0   Entered and Authorized by:   Julieanne Manson MD   Signed by:   Julieanne Manson MD on 03/07/2010  Method used:   Electronically to        [facility] [address] [phone]* (retail)       [address]       [place], [place]  [address]       Ph: [phone] or [phone]       Fax: [phone]   RxID:   [id number] TRAZODONE HCL 50 MG TABS (TRAZODONE HCL) 4 tablets by mouth nightly  #120 x 6   Entered and Authorized by:   Julieanne Manson MD   Signed by:   Julieanne Manson MD on 03/07/2010   Method used:   Electronically to        [facility] [address] [phone]* (retail)       [address]       [place], [place]  [address]       Ph: [phone] or [phone]       Fax: [phone]   RxID:   [id number] ZOLOFT 100 MG TABS (SERTRALINE HCL) 1 tablet by mouth daily  #30 x 11   Entered and Authorized by:   Julieanne Manson MD   Signed by:   Julieanne Manson MD on 03/07/2010   Method used:   Electronically to        [facility] [address] [phone]* (retail)       [address]       [place], [place]  [address]       Ph: [phone] or [phone]       Fax: [phone]   RxID:   [id number] ULTRASE MT 20 65-20-65 MU CPEP (AMYLASE-LIPASE-PROTEASE) 1 cap by mouth three times a day with each meal.  #90 x 11   Entered and Authorized by:   Julieanne Manson MD   Signed by:   Julieanne Manson MD on 03/07/2010   Method used:   [facility]ronically to        [facility] [address] [phone]* (retail)       [address]       [place], [place]  [address]        Ph: [phone] or [phone]       Fax: [phone]   RxID:   [id number] PERCOCET 5-325 MG TABS (OXYCODONE-ACETAMINOPHEN) 1 tab by mouth two times a day as needed severe pain  #30 x 0   Entered and Authorized by:   Julieanne Manson MD   Signed by:   Julieanne Manson MD on 03/07/2010   Method used:   Print then Give to Patient   RxID:   [id number] MS CONTIN 30 MG XR12H-TAB (MORPHINE SULFATE) 1 tab by mouth every 12 hours  #60 x 0   Entered and Authorized by:   Julieanne Manson MD   Signed by:   Julieanne Manson MD on 03/07/2010   Method used:   Print then Give to Patient   RxID:   [id number]    Orders Added: 1)  Est. Patient Level III [19622] 2)  Hemoccult Guaiac-1 spec.(in office) [82270] 3)  T-CBC w/Diff [29798-92119] 4)  T-Comprehensive Metabolic Panel [80053-22900] 5)  T-Amylase [82150-23210] 6)  T-Lipase [41740-81448]

## 2010-04-14 ENCOUNTER — Telehealth (INDEPENDENT_AMBULATORY_CARE_PROVIDER_SITE_OTHER): Payer: Self-pay | Admitting: Internal Medicine

## 2010-04-18 ENCOUNTER — Encounter (INDEPENDENT_AMBULATORY_CARE_PROVIDER_SITE_OTHER): Payer: Self-pay | Admitting: Internal Medicine

## 2010-04-18 ENCOUNTER — Encounter: Payer: Self-pay | Admitting: Internal Medicine

## 2010-04-24 LAB — URINALYSIS, ROUTINE W REFLEX MICROSCOPIC
Bilirubin Urine: NEGATIVE
Glucose, UA: NEGATIVE mg/dL
Hgb urine dipstick: NEGATIVE
Ketones, ur: NEGATIVE mg/dL
Nitrite: NEGATIVE
Protein, ur: NEGATIVE mg/dL
Specific Gravity, Urine: 1.017 (ref 1.005–1.030)
Urobilinogen, UA: 1 mg/dL (ref 0.0–1.0)
pH: 7.5 (ref 5.0–8.0)

## 2010-04-24 LAB — COMPREHENSIVE METABOLIC PANEL
ALT: 16 U/L (ref 0–53)
AST: 17 U/L (ref 0–37)
Albumin: 3.4 g/dL — ABNORMAL LOW (ref 3.5–5.2)
Alkaline Phosphatase: 49 U/L (ref 39–117)
BUN: 8 mg/dL (ref 6–23)
CO2: 29 mEq/L (ref 19–32)
Calcium: 8.6 mg/dL (ref 8.4–10.5)
Chloride: 106 mEq/L (ref 96–112)
Creatinine, Ser: 1.04 mg/dL (ref 0.4–1.5)
GFR calc Af Amer: >60 mL/min (ref >60)
GFR calc non Af Amer: >60 mL/min (ref >60)
Glucose, Bld: 89 mg/dL (ref 70–99)
Potassium: 3.6 mEq/L (ref 3.5–5.1)
Sodium: 142 mEq/L (ref 135–145)
Total Bilirubin: 0.5 mg/dL (ref 0.3–1.2)
Total Protein: 6.1 g/dL (ref 6.0–8.3)

## 2010-04-24 LAB — DIFFERENTIAL
Basophils Absolute: 0 10*3/uL (ref 0.0–0.1)
Basophils Relative: 1 % (ref 0–1)
Eosinophils Absolute: 0 10*3/uL (ref 0.0–0.7)
Eosinophils Relative: 0 % (ref 0–5)
Lymphocytes Relative: 45 % (ref 12–46)
Lymphs Abs: 2.1 10*3/uL (ref 0.7–4.0)
Monocytes Absolute: 0.4 10*3/uL (ref 0.1–1.0)
Monocytes Relative: 9 % (ref 3–12)
Neutro Abs: 2.1 10*3/uL (ref 1.7–7.7)
Neutrophils Relative %: 45 % (ref 43–77)

## 2010-04-24 LAB — CBC
HCT: 40.6 % (ref 39.0–52.0)
Hemoglobin: 13.8 g/dL (ref 13.0–17.0)
MCH: 31.3 pg (ref 26.0–34.0)
MCHC: 34 g/dL (ref 30.0–36.0)
MCV: 92.1 fL (ref 78.0–100.0)
Platelets: 169 10*3/uL (ref 150–400)
RBC: 4.41 MIL/uL (ref 4.22–5.81)
RDW: 13.6 % (ref 11.5–15.5)
WBC: 4.6 10*3/uL (ref 4.0–10.5)

## 2010-04-24 LAB — LIPASE, BLOOD: Lipase: 22 U/L (ref 11–59)

## 2010-04-25 NOTE — Progress Notes (Signed)
Summary: NASUATED AND VOMITING  Phone Note Call from Patient Call back at Home Phone [phone]   Reason for Call: Talk to Nurse Summary of Call: [name] PT. [name] CAME BY TODAY BECAUSE HE SAYS THAT HE HAS BEEN VOMITING AND NASUATED SINCE YESTERDAY AND IS WANTING SOMETHING CALLED IN. [name] HAS APPOINTMENT ON TUESDAY MARCH 6th WITH DR Kambre Messner Initial call taken by: Leodis Rains,  April 14, 2010 12:54 PM  Follow-up for Phone Call        Left message on answering machine for pt. to return call.  Dutch Quint RN  April 17, 2010 4:54 PM  In office to see provider.  Dutch Quint RN  April 18, 2010 2:35 PM

## 2010-04-25 NOTE — Assessment & Plan Note (Signed)
Vital Signs:  Patient profile:   40 year old male Weight:      184.8 pounds O2 Sat:      100 % on Room air Temp:     98.2 degrees F oral Pulse rate:   69 / minute Pulse rhythm:   regular Resp:     16 per minute BP sitting:   124 / 82  (left arm) Cuff size:   regular  Vitals Entered By: Levon Hedger (April 18, 2010 2:40 PM)  O2 Flow:  Room air CC: pain in navel area with vomiting Is Patient Diabetic? No Pain Assessment Patient in pain? yes     Location: navel Intensity: 8  Does patient need assistance? Functional Status Self care Ambulation Normal   CC:  pain in navel area with vomiting.  History of Present Illness: 1.  Pancreatitis with chronic pain:  Was taking MS Contin, but ran out 10 days or more ago.  Needed Percocet in between morphine doses every day.  Very difficult getting pt. to tell me what he is doing with pain medication.  States when he awakens at 6 a.m., he takes his MS Contin and eats breakfast.  He states he is already in pain--this is his most severe pain of the day.  He does not want to take the Percocet at the same time, so he waits 3 hours and takes at 9a.m.  Pain is then pretty well controlled untilt 6 p.m  when he takes his second dose of MS Contin.  Also out of pancreatic enzymes  2.  Nausea with vomiting of bilious fluid.  Burns all the way up.  The Promethazine helped, but he is now out.  Pt. for some reason believes his Zoloft is causing this, but difficult to say why--does take first thing in morning as well.    Allergies (verified): 1)  ! Aspirin (Aspirin) 2)  ! Ibuprofen (Ibuprofen)  Physical Exam  General:  NAD Lungs:  Normal respiratory effort, chest expands symmetrically. Lungs are clear to auscultation, no crackles or wheezes. Heart:  Normal rate and regular rhythm. S1 and S2 normal without gallop, murmur, click, rub or other extra sounds. Abdomen:  Mild to moderate epigastric tendernesssoft, normal bowel sounds, no hepatomegaly,  and no splenomegaly.     Impression & Recommendations:  Problem # 1:  ABDOMINAL PAIN, GENERALIZED (ICD-789.07) Will add an H2 blocker with Ranitidine Discussed with pt. that all his meds were [facility] and needs to stay on them all.  Problem # 2:  PANCREATITIS, CHRONIC (ICD-577.1) Increase evening dose of MS Contin and discontinue Percocet. To call for refill monthly  Complete Medication List: 1)  Zoloft 100 Mg Tabs (Sertraline hcl) .[name].. 1 tablet by mouth daily 2)  Trazodone Hcl 50 Mg Tabs (Trazodone hcl) .... 4 tablets by mouth nightly 3)  Ambien 10 Mg Tabs (Zolpidem tartrate) .[name].. 1 tab by mouth q hs as needed sleep 4)  Promethazine Hcl 25 Mg Tabs (Promethazine hcl) .[name].. 1 tab every 6 hours as needed for nausea and vomiting.  use suppository if unable to keep down 5)  Promethazine Hcl 25 Mg Supp (Promethazine hcl) .[name].. 1 per rectum if unable to keep oral med down--every 6 hours as needed 6)  Flexeril 10 Mg Tabs (Cyclobenzaprine hcl) .[name].. 1 tab by mouth every 8 hours as needed for back pain 7)  Ms Contin 30 Mg Xr12h-tab (Morphine sulfate) .[name].. 1 tab at 6 a.m. and 1 tab with a 15 mg  tab at 6 p.m. 8)  Zenpep 20000 Unit Cpep (Pancrelipase (lip-prot-amyl)) .[name].. 1 cap by mouth three times a day with meals 9)  Ms Contin 15 Mg Xr12h-tab (Morphine sulfate) .[name].. 1 tab by mouth with 30 mg tab at 6 p.m. daily 10)  Ranitidine Hcl 150 Mg Tabs (Ranitidine hcl) .[name].. 1 tab by mouth two times a day 1/2 hour before meals.  Patient Instructions: 1)  Call in for your pain medication monthly--2 days before need for refill. 2)  Follow up with Dr. Delrae Alfred in 3 months --abdominal pain Prescriptions: PROMETHAZINE HCL 25 MG SUPP (PROMETHAZINE HCL) 1 per rectum if unable to keep oral med down--every 6 hours as needed  #10 x 0   Entered by:   Julieanne Manson MD   Authorized by:   Lehman Prom FNP   Signed by:   Julieanne Manson MD on 04/18/2010   Method used:   Electronically to         [facility] [address] [phone]* (retail)       [address]       [place], [place]  [address]       Ph: [phone] or [phone]       Fax: [phone]   RxID:   [id number] PROMETHAZINE HCL 25 MG TABS (PROMETHAZINE HCL) 1 tab every 6 hours as needed for nausea and vomiting.  Use suppository if unable to keep down  #20 x 0   Entered by:   Julieanne Manson MD   Authorized by:   Lehman Prom FNP   Signed by:   Julieanne Manson MD on 04/18/2010   Method used:   Electronically to        [facility] [address] [phone]* (retail)       [address]       [place], [place]  [address]       Ph: [phone] or [phone]       Fax: [phone]   RxID:   [id number] RANITIDINE HCL 150 MG TABS (RANITIDINE HCL) 1 tab by mouth two times a day 1/2 hour before meals.  #60 x 4   Entered by:   Julieanne Manson MD   Authorized by:   Lehman Prom FNP   Signed by:   Julieanne Manson MD on 04/18/2010   Method used:   Electronically to        [facility] [address] [phone]* (retail)       [address]       [place], [place]  [address]       Ph: [phone] or [phone]       Fax: [phone]   RxID:   [id number] MS CONTIN 15 MG XR12H-TAB (MORPHINE SULFATE) 1 tab by mouth with 30 mg tab at 6 p.m. daily  #30 x 0   Entered by:   Julieanne Manson MD   Authorized by:   Lehman Prom FNP   Signed by:   Julieanne Manson MD on 04/18/2010   Method used:   Print then Give to Patient   RxID:   [id number] MS CONTIN 30 MG XR12H-TAB (MORPHINE SULFATE) 1 tab at 6 a.m. and 1 tab with a 15 mg tab at 6 p.m.  #60 x 0   Entered by:   Julieanne Manson MD   Authorized by:   Lehman Prom FNP   Signed by:  Julieanne Manson MD on 04/18/2010   Method used:   Print then Give to Patient   RxID:   [id number]    Orders Added: 1)  Est. Patient Level III [38756]

## 2010-04-27 LAB — COMPREHENSIVE METABOLIC PANEL
ALT: 14 U/L (ref 0–53)
AST: 15 U/L (ref 0–37)
Albumin: 3.7 g/dL (ref 3.5–5.2)
Alkaline Phosphatase: 47 U/L (ref 39–117)
BUN: 4 mg/dL — ABNORMAL LOW (ref 6–23)
CO2: 30 mEq/L (ref 19–32)
Calcium: 9 mg/dL (ref 8.4–10.5)
Chloride: 102 mEq/L (ref 96–112)
Creatinine, Ser: 0.95 mg/dL (ref 0.4–1.5)
GFR calc Af Amer: >60 mL/min (ref >60)
GFR calc non Af Amer: >60 mL/min (ref >60)
Glucose, Bld: 91 mg/dL (ref 70–99)
Potassium: 3.8 mEq/L (ref 3.5–5.1)
Sodium: 136 mEq/L (ref 135–145)
Total Bilirubin: 0.6 mg/dL (ref 0.3–1.2)
Total Protein: 6.6 g/dL (ref 6.0–8.3)

## 2010-04-27 LAB — CBC
HCT: 45.1 % (ref 39.0–52.0)
Hemoglobin: 16.4 g/dL (ref 13.0–17.0)
MCH: 32.7 pg (ref 26.0–34.0)
MCHC: 36.4 g/dL — ABNORMAL HIGH (ref 30.0–36.0)
MCV: 90 fL (ref 78.0–100.0)
Platelets: 205 10*3/uL (ref 150–400)
RBC: 5.01 MIL/uL (ref 4.22–5.81)
RDW: 13.4 % (ref 11.5–15.5)
WBC: 3.7 10*3/uL — ABNORMAL LOW (ref 4.0–10.5)

## 2010-04-27 LAB — DIFFERENTIAL
Basophils Absolute: 0 10*3/uL (ref 0.0–0.1)
Basophils Relative: 1 % (ref 0–1)
Eosinophils Absolute: 0 10*3/uL (ref 0.0–0.7)
Eosinophils Relative: 1 % (ref 0–5)
Lymphocytes Relative: 45 % (ref 12–46)
Lymphs Abs: 1.7 10*3/uL (ref 0.7–4.0)
Monocytes Absolute: 0.5 10*3/uL (ref 0.1–1.0)
Monocytes Relative: 13 % — ABNORMAL HIGH (ref 3–12)
Neutro Abs: 1.5 10*3/uL — ABNORMAL LOW (ref 1.7–7.7)
Neutrophils Relative %: 41 % — ABNORMAL LOW (ref 43–77)

## 2010-04-27 LAB — LIPASE, BLOOD: Lipase: 20 U/L (ref 11–59)

## 2010-04-30 LAB — COMPREHENSIVE METABOLIC PANEL
ALT: 18 U/L (ref 0–53)
ALT: 19 U/L (ref 0–53)
AST: 17 U/L (ref 0–37)
AST: 17 U/L (ref 0–37)
Albumin: 3.3 g/dL — ABNORMAL LOW (ref 3.5–5.2)
Albumin: 3.7 g/dL (ref 3.5–5.2)
Alkaline Phosphatase: 50 U/L (ref 39–117)
Alkaline Phosphatase: 50 U/L (ref 39–117)
BUN: 6 mg/dL (ref 6–23)
BUN: 8 mg/dL (ref 6–23)
CO2: 26 mEq/L (ref 19–32)
CO2: 28 mEq/L (ref 19–32)
Calcium: 8.1 mg/dL — ABNORMAL LOW (ref 8.4–10.5)
Calcium: 8.7 mg/dL (ref 8.4–10.5)
Chloride: 104 mEq/L (ref 96–112)
Chloride: 109 mEq/L (ref 96–112)
Creatinine, Ser: 0.88 mg/dL (ref 0.4–1.5)
Creatinine, Ser: 1.04 mg/dL (ref 0.4–1.5)
GFR calc Af Amer: >60 mL/min (ref >60)
GFR calc Af Amer: >60 mL/min (ref >60)
GFR calc non Af Amer: >60 mL/min (ref >60)
GFR calc non Af Amer: >60 mL/min (ref >60)
Glucose, Bld: 78 mg/dL (ref 70–99)
Glucose, Bld: 93 mg/dL (ref 70–99)
Potassium: 3.8 mEq/L (ref 3.5–5.1)
Potassium: 4 mEq/L (ref 3.5–5.1)
Sodium: 139 mEq/L (ref 135–145)
Sodium: 140 mEq/L (ref 135–145)
Total Bilirubin: 0.3 mg/dL (ref 0.3–1.2)
Total Bilirubin: 0.9 mg/dL (ref 0.3–1.2)
Total Protein: 6.2 g/dL (ref 6.0–8.3)
Total Protein: 6.7 g/dL (ref 6.0–8.3)

## 2010-04-30 LAB — CBC
HCT: 39.8 % (ref 39.0–52.0)
HCT: 44.4 % (ref 39.0–52.0)
Hemoglobin: 13.6 g/dL (ref 13.0–17.0)
Hemoglobin: 14.6 g/dL (ref 13.0–17.0)
MCHC: 32.9 g/dL (ref 30.0–36.0)
MCHC: 34.2 g/dL (ref 30.0–36.0)
MCV: 93.7 fL (ref 78.0–100.0)
MCV: 94.4 fL (ref 78.0–100.0)
Platelets: 176 10*3/uL (ref 150–400)
Platelets: 204 10*3/uL (ref 150–400)
RBC: 4.25 MIL/uL (ref 4.22–5.81)
RBC: 4.71 MIL/uL (ref 4.22–5.81)
RDW: 14 % (ref 11.5–15.5)
RDW: 14.1 % (ref 11.5–15.5)
WBC: 4.8 10*3/uL (ref 4.0–10.5)
WBC: 5.1 10*3/uL (ref 4.0–10.5)

## 2010-04-30 LAB — RAPID URINE DRUG SCREEN, HOSP PERFORMED
Amphetamines: NOT DETECTED
Barbiturates: NOT DETECTED
Benzodiazepines: NOT DETECTED
Cocaine: NOT DETECTED
Opiates: POSITIVE — AB
Tetrahydrocannabinol: POSITIVE — AB

## 2010-04-30 LAB — LIPASE, BLOOD
Lipase: 19 U/L (ref 11–59)
Lipase: 31 U/L (ref 11–59)

## 2010-04-30 LAB — DIFFERENTIAL
Basophils Absolute: 0 10*3/uL (ref 0.0–0.1)
Basophils Relative: 0 % (ref 0–1)
Eosinophils Absolute: 0 10*3/uL (ref 0.0–0.7)
Eosinophils Relative: 1 % (ref 0–5)
Lymphocytes Relative: 54 % — ABNORMAL HIGH (ref 12–46)
Lymphs Abs: 2.8 10*3/uL (ref 0.7–4.0)
Monocytes Absolute: 0.5 10*3/uL (ref 0.1–1.0)
Monocytes Relative: 10 % (ref 3–12)
Neutro Abs: 1.8 10*3/uL (ref 1.7–7.7)
Neutrophils Relative %: 34 % — ABNORMAL LOW (ref 43–77)

## 2010-05-01 LAB — DIFFERENTIAL
Basophils Absolute: 0.1 10*3/uL (ref 0.0–0.1)
Basophils Relative: 1 % (ref 0–1)
Eosinophils Absolute: 0 10*3/uL (ref 0.0–0.7)
Eosinophils Relative: 1 % (ref 0–5)
Lymphocytes Relative: 49 % — ABNORMAL HIGH (ref 12–46)
Lymphs Abs: 3 10*3/uL (ref 0.7–4.0)
Monocytes Absolute: 0.6 10*3/uL (ref 0.1–1.0)
Monocytes Relative: 9 % (ref 3–12)
Neutro Abs: 2.4 10*3/uL (ref 1.7–7.7)
Neutrophils Relative %: 40 % — ABNORMAL LOW (ref 43–77)

## 2010-05-01 LAB — CBC
HCT: 44 % (ref 39.0–52.0)
Hemoglobin: 14.9 g/dL (ref 13.0–17.0)
MCHC: 33.8 g/dL (ref 30.0–36.0)
MCV: 94.6 fL (ref 78.0–100.0)
Platelets: 195 10*3/uL (ref 150–400)
RBC: 4.65 MIL/uL (ref 4.22–5.81)
RDW: 14.1 % (ref 11.5–15.5)
WBC: 6 10*3/uL (ref 4.0–10.5)

## 2010-05-01 LAB — RAPID URINE DRUG SCREEN, HOSP PERFORMED
Amphetamines: NOT DETECTED
Barbiturates: NOT DETECTED
Benzodiazepines: NOT DETECTED
Cocaine: NOT DETECTED
Opiates: NOT DETECTED
Tetrahydrocannabinol: POSITIVE — AB

## 2010-05-01 LAB — URINALYSIS, ROUTINE W REFLEX MICROSCOPIC
Bilirubin Urine: NEGATIVE
Glucose, UA: NEGATIVE mg/dL
Hgb urine dipstick: NEGATIVE
Ketones, ur: NEGATIVE mg/dL
Nitrite: NEGATIVE
Protein, ur: NEGATIVE mg/dL
Specific Gravity, Urine: 1.013 (ref 1.005–1.030)
Urobilinogen, UA: 1 mg/dL (ref 0.0–1.0)
pH: 7 (ref 5.0–8.0)

## 2010-05-01 LAB — COMPREHENSIVE METABOLIC PANEL
ALT: 12 U/L (ref 0–53)
AST: 19 U/L (ref 0–37)
Albumin: 3.8 g/dL (ref 3.5–5.2)
Alkaline Phosphatase: 50 U/L (ref 39–117)
BUN: 6 mg/dL (ref 6–23)
CO2: 27 mEq/L (ref 19–32)
Calcium: 8.8 mg/dL (ref 8.4–10.5)
Chloride: 107 mEq/L (ref 96–112)
Creatinine, Ser: 1.16 mg/dL (ref 0.4–1.5)
GFR calc Af Amer: >60 mL/min (ref >60)
GFR calc non Af Amer: >60 mL/min (ref >60)
Glucose, Bld: 99 mg/dL (ref 70–99)
Potassium: 3.4 mEq/L — ABNORMAL LOW (ref 3.5–5.1)
Sodium: 139 mEq/L (ref 135–145)
Total Bilirubin: 0.6 mg/dL (ref 0.3–1.2)
Total Protein: 6.9 g/dL (ref 6.0–8.3)

## 2010-05-01 LAB — ETHANOL: Alcohol, Ethyl (B): <5 mg/dL (ref 0–10)

## 2010-05-01 LAB — HEMOCCULT GUIAC POC 1CARD (OFFICE): Fecal Occult Bld: NEGATIVE

## 2010-05-01 LAB — LIPASE, BLOOD: Lipase: 24 U/L (ref 11–59)

## 2010-05-07 LAB — URINALYSIS, ROUTINE W REFLEX MICROSCOPIC
Bilirubin Urine: NEGATIVE
Glucose, UA: NEGATIVE mg/dL
Hgb urine dipstick: NEGATIVE
Ketones, ur: NEGATIVE mg/dL
Nitrite: NEGATIVE
Protein, ur: NEGATIVE mg/dL
Specific Gravity, Urine: 1.023 (ref 1.005–1.030)
Urobilinogen, UA: 0.2 mg/dL (ref 0.0–1.0)
pH: 6.5 (ref 5.0–8.0)

## 2010-05-07 LAB — COMPREHENSIVE METABOLIC PANEL
ALT: 14 U/L (ref 0–53)
AST: 20 U/L (ref 0–37)
Albumin: 3.8 g/dL (ref 3.5–5.2)
Alkaline Phosphatase: 49 U/L (ref 39–117)
BUN: 6 mg/dL (ref 6–23)
CO2: 27 mEq/L (ref 19–32)
Calcium: 8 mg/dL — ABNORMAL LOW (ref 8.4–10.5)
Chloride: 103 mEq/L (ref 96–112)
Creatinine, Ser: 0.95 mg/dL (ref 0.4–1.5)
GFR calc Af Amer: >60 mL/min (ref >60)
GFR calc non Af Amer: >60 mL/min (ref >60)
Glucose, Bld: 93 mg/dL (ref 70–99)
Potassium: 4 mEq/L (ref 3.5–5.1)
Sodium: 134 mEq/L — ABNORMAL LOW (ref 135–145)
Total Bilirubin: 0.8 mg/dL (ref 0.3–1.2)
Total Protein: 6.9 g/dL (ref 6.0–8.3)

## 2010-05-07 LAB — DIFFERENTIAL
Basophils Absolute: 0 10*3/uL (ref 0.0–0.1)
Basophils Relative: 1 % (ref 0–1)
Eosinophils Absolute: 0 10*3/uL (ref 0.0–0.7)
Eosinophils Relative: 1 % (ref 0–5)
Lymphocytes Relative: 54 % — ABNORMAL HIGH (ref 12–46)
Lymphs Abs: 2 10*3/uL (ref 0.7–4.0)
Monocytes Absolute: 0.3 10*3/uL (ref 0.1–1.0)
Monocytes Relative: 10 % (ref 3–12)
Neutro Abs: 1.3 10*3/uL — ABNORMAL LOW (ref 1.7–7.7)
Neutrophils Relative %: 35 % — ABNORMAL LOW (ref 43–77)

## 2010-05-07 LAB — CBC
HCT: 44.8 % (ref 39.0–52.0)
Hemoglobin: 15.1 g/dL (ref 13.0–17.0)
MCHC: 33.6 g/dL (ref 30.0–36.0)
MCV: 94.9 fL (ref 78.0–100.0)
Platelets: 180 10*3/uL (ref 150–400)
RBC: 4.72 MIL/uL (ref 4.22–5.81)
RDW: 14.1 % (ref 11.5–15.5)
WBC: 3.6 10*3/uL — ABNORMAL LOW (ref 4.0–10.5)

## 2010-05-07 LAB — AMYLASE: Amylase: 60 U/L (ref 0–105)

## 2010-05-07 LAB — LIPASE, BLOOD: Lipase: 18 U/L (ref 11–59)

## 2010-05-16 LAB — DIFFERENTIAL
Basophils Absolute: 0 10*3/uL (ref 0.0–0.1)
Basophils Relative: 0 % (ref 0–1)
Eosinophils Absolute: 0 10*3/uL (ref 0.0–0.7)
Eosinophils Relative: 1 % (ref 0–5)
Lymphocytes Relative: 55 % — ABNORMAL HIGH (ref 12–46)
Lymphs Abs: 2.3 10*3/uL (ref 0.7–4.0)
Monocytes Absolute: 0.4 10*3/uL (ref 0.1–1.0)
Monocytes Relative: 9 % (ref 3–12)
Neutro Abs: 1.5 10*3/uL — ABNORMAL LOW (ref 1.7–7.7)
Neutrophils Relative %: 35 % — ABNORMAL LOW (ref 43–77)

## 2010-05-16 LAB — POCT I-STAT, CHEM 8
BUN: 6 mg/dL (ref 6–23)
Calcium, Ion: 1.15 mmol/L (ref 1.12–1.32)
Chloride: 104 mEq/L (ref 96–112)
Creatinine, Ser: 0.9 mg/dL (ref 0.4–1.5)
Glucose, Bld: 83 mg/dL (ref 70–99)
HCT: 45 % (ref 39.0–52.0)
Hemoglobin: 15.3 g/dL (ref 13.0–17.0)
Potassium: 3.9 mEq/L (ref 3.5–5.1)
Sodium: 138 mEq/L (ref 135–145)
TCO2: 28 mmol/L (ref 0–100)

## 2010-05-16 LAB — HEPATIC FUNCTION PANEL
ALT: 20 U/L (ref 0–53)
AST: 20 U/L (ref 0–37)
Albumin: 3.5 g/dL (ref 3.5–5.2)
Alkaline Phosphatase: 47 U/L (ref 39–117)
Bilirubin, Direct: <0.1 mg/dL (ref 0.0–0.3)
Total Bilirubin: 0.7 mg/dL (ref 0.3–1.2)
Total Protein: 6.4 g/dL (ref 6.0–8.3)

## 2010-05-16 LAB — URINALYSIS, ROUTINE W REFLEX MICROSCOPIC
Bilirubin Urine: NEGATIVE
Glucose, UA: NEGATIVE mg/dL
Hgb urine dipstick: NEGATIVE
Ketones, ur: NEGATIVE mg/dL
Nitrite: NEGATIVE
Protein, ur: NEGATIVE mg/dL
Specific Gravity, Urine: 1.014 (ref 1.005–1.030)
Urobilinogen, UA: 1 mg/dL (ref 0.0–1.0)
pH: 6.5 (ref 5.0–8.0)

## 2010-05-16 LAB — CBC
HCT: 42.4 % (ref 39.0–52.0)
Hemoglobin: 14.4 g/dL (ref 13.0–17.0)
MCHC: 34 g/dL (ref 30.0–36.0)
MCV: 94.1 fL (ref 78.0–100.0)
Platelets: 176 10*3/uL (ref 150–400)
RBC: 4.51 MIL/uL (ref 4.22–5.81)
RDW: 14.1 % (ref 11.5–15.5)
WBC: 4.2 10*3/uL (ref 4.0–10.5)

## 2010-05-16 LAB — LIPASE, BLOOD: Lipase: 22 U/L (ref 11–59)

## 2010-05-17 LAB — URINALYSIS, ROUTINE W REFLEX MICROSCOPIC
Bilirubin Urine: NEGATIVE
Glucose, UA: NEGATIVE mg/dL
Hgb urine dipstick: NEGATIVE
Ketones, ur: NEGATIVE mg/dL
Nitrite: NEGATIVE
Protein, ur: NEGATIVE mg/dL
Specific Gravity, Urine: 1.003 — ABNORMAL LOW (ref 1.005–1.030)
Urobilinogen, UA: 0.2 mg/dL (ref 0.0–1.0)
pH: 7 (ref 5.0–8.0)

## 2010-05-17 LAB — DIFFERENTIAL
Basophils Absolute: 0 10*3/uL (ref 0.0–0.1)
Basophils Relative: 1 % (ref 0–1)
Eosinophils Absolute: 0.1 10*3/uL (ref 0.0–0.7)
Eosinophils Relative: 2 % (ref 0–5)
Lymphocytes Relative: 51 % — ABNORMAL HIGH (ref 12–46)
Lymphs Abs: 2.8 10*3/uL (ref 0.7–4.0)
Monocytes Absolute: 0.6 10*3/uL (ref 0.1–1.0)
Monocytes Relative: 11 % (ref 3–12)
Neutro Abs: 1.9 10*3/uL (ref 1.7–7.7)
Neutrophils Relative %: 35 % — ABNORMAL LOW (ref 43–77)

## 2010-05-17 LAB — COMPREHENSIVE METABOLIC PANEL
ALT: 16 U/L (ref 0–53)
AST: 17 U/L (ref 0–37)
Albumin: 3.8 g/dL (ref 3.5–5.2)
Alkaline Phosphatase: 53 U/L (ref 39–117)
BUN: 8 mg/dL (ref 6–23)
CO2: 27 mEq/L (ref 19–32)
Calcium: 8.9 mg/dL (ref 8.4–10.5)
Chloride: 104 mEq/L (ref 96–112)
Creatinine, Ser: 1.04 mg/dL (ref 0.4–1.5)
GFR calc Af Amer: >60 mL/min (ref >60)
GFR calc non Af Amer: >60 mL/min (ref >60)
Glucose, Bld: 90 mg/dL (ref 70–99)
Potassium: 3.4 mEq/L — ABNORMAL LOW (ref 3.5–5.1)
Sodium: 137 mEq/L (ref 135–145)
Total Bilirubin: 0.5 mg/dL (ref 0.3–1.2)
Total Protein: 6.8 g/dL (ref 6.0–8.3)

## 2010-05-17 LAB — HEMOCCULT GUIAC POC 1CARD (OFFICE): Fecal Occult Bld: NEGATIVE

## 2010-05-17 LAB — CBC
HCT: 41.2 % (ref 39.0–52.0)
Hemoglobin: 13.9 g/dL (ref 13.0–17.0)
MCHC: 33.8 g/dL (ref 30.0–36.0)
MCV: 94.9 fL (ref 78.0–100.0)
Platelets: 207 10*3/uL (ref 150–400)
RBC: 4.34 MIL/uL (ref 4.22–5.81)
RDW: 14.1 % (ref 11.5–15.5)
WBC: 5.5 10*3/uL (ref 4.0–10.5)

## 2010-05-17 LAB — LIPASE, BLOOD: Lipase: 20 U/L (ref 11–59)

## 2010-05-18 LAB — CBC
HCT: 42.8 % (ref 39.0–52.0)
HCT: 43.5 % (ref 39.0–52.0)
Hemoglobin: 14.6 g/dL (ref 13.0–17.0)
Hemoglobin: 14.9 g/dL (ref 13.0–17.0)
MCHC: 34.1 g/dL (ref 30.0–36.0)
MCHC: 34.2 g/dL (ref 30.0–36.0)
MCV: 93.7 fL (ref 78.0–100.0)
MCV: 94.3 fL (ref 78.0–100.0)
Platelets: 204 10*3/uL (ref 150–400)
Platelets: 194 10*3/uL (ref 150–400)
RBC: 4.57 MIL/uL (ref 4.22–5.81)
RBC: 4.62 MIL/uL (ref 4.22–5.81)
RDW: 14.1 % (ref 11.5–15.5)
RDW: 15 % (ref 11.5–15.5)
WBC: 4.5 10*3/uL (ref 4.0–10.5)
WBC: 6 10*3/uL (ref 4.0–10.5)

## 2010-05-18 LAB — COMPREHENSIVE METABOLIC PANEL
ALT: 20 U/L (ref 0–53)
ALT: 27 U/L (ref 0–53)
AST: 24 U/L (ref 0–37)
AST: 20 U/L (ref 0–37)
Albumin: 3.9 g/dL (ref 3.5–5.2)
Albumin: 4.1 g/dL (ref 3.5–5.2)
Alkaline Phosphatase: 52 U/L (ref 39–117)
Alkaline Phosphatase: 52 U/L (ref 39–117)
BUN: 6 mg/dL (ref 6–23)
BUN: 5 mg/dL — ABNORMAL LOW (ref 6–23)
CO2: 27 mEq/L (ref 19–32)
CO2: 26 mEq/L (ref 19–32)
Calcium: 9.3 mg/dL (ref 8.4–10.5)
Calcium: 9 mg/dL (ref 8.4–10.5)
Chloride: 105 mEq/L (ref 96–112)
Chloride: 103 mEq/L (ref 96–112)
Creatinine, Ser: 1.11 mg/dL (ref 0.4–1.5)
Creatinine, Ser: 0.95 mg/dL (ref 0.4–1.5)
GFR calc Af Amer: >60 mL/min (ref >60)
GFR calc Af Amer: >60 mL/min (ref >60)
GFR calc non Af Amer: >60 mL/min (ref >60)
GFR calc non Af Amer: >60 mL/min (ref >60)
Glucose, Bld: 87 mg/dL (ref 70–99)
Glucose, Bld: 97 mg/dL (ref 70–99)
Potassium: 3.7 mEq/L (ref 3.5–5.1)
Potassium: 3.8 mEq/L (ref 3.5–5.1)
Sodium: 138 mEq/L (ref 135–145)
Sodium: 136 mEq/L (ref 135–145)
Total Bilirubin: 0.7 mg/dL (ref 0.3–1.2)
Total Bilirubin: 0.5 mg/dL (ref 0.3–1.2)
Total Protein: 7.1 g/dL (ref 6.0–8.3)
Total Protein: 7.2 g/dL (ref 6.0–8.3)

## 2010-05-18 LAB — DIFFERENTIAL
Basophils Absolute: 0 10*3/uL (ref 0.0–0.1)
Basophils Absolute: 0 10*3/uL (ref 0.0–0.1)
Basophils Relative: 0 % (ref 0–1)
Basophils Relative: 0 % (ref 0–1)
Eosinophils Absolute: 0 10*3/uL (ref 0.0–0.7)
Eosinophils Absolute: 0 10*3/uL (ref 0.0–0.7)
Eosinophils Relative: 1 % (ref 0–5)
Eosinophils Relative: 0 % (ref 0–5)
Lymphocytes Relative: 37 % (ref 12–46)
Lymphocytes Relative: 34 % (ref 12–46)
Lymphs Abs: 1.6 10*3/uL (ref 0.7–4.0)
Lymphs Abs: 2 10*3/uL (ref 0.7–4.0)
Monocytes Absolute: 0.4 10*3/uL (ref 0.1–1.0)
Monocytes Absolute: 0.5 10*3/uL (ref 0.1–1.0)
Monocytes Relative: 10 % (ref 3–12)
Monocytes Relative: 9 % (ref 3–12)
Neutro Abs: 2.3 10*3/uL (ref 1.7–7.7)
Neutro Abs: 3.4 10*3/uL (ref 1.7–7.7)
Neutrophils Relative %: 52 % (ref 43–77)
Neutrophils Relative %: 56 % (ref 43–77)

## 2010-05-18 LAB — LIPASE, BLOOD
Lipase: 62 U/L — ABNORMAL HIGH (ref 11–59)
Lipase: 17 U/L (ref 11–59)

## 2010-05-18 LAB — URINALYSIS, ROUTINE W REFLEX MICROSCOPIC
Bilirubin Urine: NEGATIVE
Glucose, UA: NEGATIVE mg/dL
Glucose, UA: NEGATIVE mg/dL
Hgb urine dipstick: NEGATIVE
Hgb urine dipstick: NEGATIVE
Ketones, ur: 15 mg/dL — AB
Ketones, ur: NEGATIVE mg/dL
Nitrite: NEGATIVE
Nitrite: NEGATIVE
Protein, ur: NEGATIVE mg/dL
Protein, ur: NEGATIVE mg/dL
Specific Gravity, Urine: 1.028 (ref 1.005–1.030)
Specific Gravity, Urine: 1.023 (ref 1.005–1.030)
Urobilinogen, UA: 1 mg/dL (ref 0.0–1.0)
Urobilinogen, UA: 1 mg/dL (ref 0.0–1.0)
pH: 6 (ref 5.0–8.0)
pH: 7.5 (ref 5.0–8.0)

## 2010-05-18 LAB — RAPID URINE DRUG SCREEN, HOSP PERFORMED
Amphetamines: NOT DETECTED
Barbiturates: NOT DETECTED
Benzodiazepines: NOT DETECTED
Cocaine: NOT DETECTED
Opiates: NOT DETECTED
Tetrahydrocannabinol: POSITIVE — AB

## 2010-05-18 LAB — ETHANOL: Alcohol, Ethyl (B): <5 mg/dL (ref 0–10)

## 2010-05-18 LAB — URINE MICROSCOPIC-ADD ON

## 2010-05-19 LAB — COMPREHENSIVE METABOLIC PANEL
ALT: 14 U/L (ref 0–53)
AST: 16 U/L (ref 0–37)
Albumin: 3.8 g/dL (ref 3.5–5.2)
Alkaline Phosphatase: 54 U/L (ref 39–117)
BUN: 8 mg/dL (ref 6–23)
CO2: 22 mEq/L (ref 19–32)
Calcium: 9.2 mg/dL (ref 8.4–10.5)
Chloride: 105 mEq/L (ref 96–112)
Creatinine, Ser: 1.04 mg/dL (ref 0.4–1.5)
GFR calc Af Amer: >60 mL/min (ref >60)
GFR calc non Af Amer: >60 mL/min (ref >60)
Glucose, Bld: 103 mg/dL — ABNORMAL HIGH (ref 70–99)
Potassium: 4 mEq/L (ref 3.5–5.1)
Sodium: 138 mEq/L (ref 135–145)
Total Bilirubin: 0.6 mg/dL (ref 0.3–1.2)
Total Protein: 7 g/dL (ref 6.0–8.3)

## 2010-05-19 LAB — URINALYSIS, ROUTINE W REFLEX MICROSCOPIC
Bilirubin Urine: NEGATIVE
Glucose, UA: NEGATIVE mg/dL
Hgb urine dipstick: NEGATIVE
Ketones, ur: NEGATIVE mg/dL
Nitrite: NEGATIVE
Protein, ur: NEGATIVE mg/dL
Specific Gravity, Urine: 1.017 (ref 1.005–1.030)
Urobilinogen, UA: 1 mg/dL (ref 0.0–1.0)
pH: 8 (ref 5.0–8.0)

## 2010-05-19 LAB — DIFFERENTIAL
Basophils Absolute: 0 10*3/uL (ref 0.0–0.1)
Basophils Relative: 0 % (ref 0–1)
Eosinophils Absolute: 0.1 10*3/uL (ref 0.0–0.7)
Eosinophils Relative: 1 % (ref 0–5)
Lymphocytes Relative: 46 % (ref 12–46)
Lymphs Abs: 2.6 10*3/uL (ref 0.7–4.0)
Monocytes Absolute: 0.6 10*3/uL (ref 0.1–1.0)
Monocytes Relative: 10 % (ref 3–12)
Neutro Abs: 2.4 10*3/uL (ref 1.7–7.7)
Neutrophils Relative %: 43 % (ref 43–77)

## 2010-05-19 LAB — URINE MICROSCOPIC-ADD ON

## 2010-05-19 LAB — LIPASE, BLOOD: Lipase: 16 U/L (ref 11–59)

## 2010-05-19 LAB — CBC
HCT: 44.4 % (ref 39.0–52.0)
Hemoglobin: 14.9 g/dL (ref 13.0–17.0)
MCHC: 33.7 g/dL (ref 30.0–36.0)
MCV: 94.4 fL (ref 78.0–100.0)
Platelets: 226 10*3/uL (ref 150–400)
RBC: 4.7 MIL/uL (ref 4.22–5.81)
RDW: 14.4 % (ref 11.5–15.5)
WBC: 5.7 10*3/uL (ref 4.0–10.5)

## 2010-05-20 LAB — CBC
HCT: 41.8 % (ref 39.0–52.0)
Hemoglobin: 14.2 g/dL (ref 13.0–17.0)
MCHC: 33.9 g/dL (ref 30.0–36.0)
MCV: 93.1 fL (ref 78.0–100.0)
Platelets: 195 10*3/uL (ref 150–400)
RBC: 4.49 MIL/uL (ref 4.22–5.81)
RDW: 14.2 % (ref 11.5–15.5)
WBC: 5.6 10*3/uL (ref 4.0–10.5)

## 2010-05-20 LAB — COMPREHENSIVE METABOLIC PANEL
ALT: 15 U/L (ref 0–53)
AST: 15 U/L (ref 0–37)
Albumin: 3.7 g/dL (ref 3.5–5.2)
Alkaline Phosphatase: 48 U/L (ref 39–117)
BUN: 5 mg/dL — ABNORMAL LOW (ref 6–23)
CO2: 25 mEq/L (ref 19–32)
Calcium: 9.1 mg/dL (ref 8.4–10.5)
Chloride: 106 mEq/L (ref 96–112)
Creatinine, Ser: 1.08 mg/dL (ref 0.4–1.5)
GFR calc Af Amer: >60 mL/min (ref >60)
GFR calc non Af Amer: >60 mL/min (ref >60)
Glucose, Bld: 99 mg/dL (ref 70–99)
Potassium: 3.5 mEq/L (ref 3.5–5.1)
Sodium: 138 mEq/L (ref 135–145)
Total Bilirubin: 0.5 mg/dL (ref 0.3–1.2)
Total Protein: 6.9 g/dL (ref 6.0–8.3)

## 2010-05-20 LAB — DIFFERENTIAL
Basophils Absolute: 0 10*3/uL (ref 0.0–0.1)
Basophils Relative: 1 % (ref 0–1)
Eosinophils Absolute: 0 10*3/uL (ref 0.0–0.7)
Eosinophils Relative: 1 % (ref 0–5)
Lymphocytes Relative: 35 % (ref 12–46)
Lymphs Abs: 1.9 10*3/uL (ref 0.7–4.0)
Monocytes Absolute: 0.5 10*3/uL (ref 0.1–1.0)
Monocytes Relative: 9 % (ref 3–12)
Neutro Abs: 3.1 10*3/uL (ref 1.7–7.7)
Neutrophils Relative %: 55 % (ref 43–77)

## 2010-05-20 LAB — URINALYSIS, ROUTINE W REFLEX MICROSCOPIC
Glucose, UA: NEGATIVE mg/dL
Hgb urine dipstick: NEGATIVE
Ketones, ur: NEGATIVE mg/dL
Nitrite: NEGATIVE
Protein, ur: NEGATIVE mg/dL
Specific Gravity, Urine: 1.023 (ref 1.005–1.030)
Urobilinogen, UA: 1 mg/dL (ref 0.0–1.0)
pH: 6 (ref 5.0–8.0)

## 2010-05-20 LAB — LIPASE, BLOOD: Lipase: 20 U/L (ref 11–59)

## 2010-05-21 LAB — BASIC METABOLIC PANEL
BUN: 6 mg/dL (ref 6–23)
CO2: 29 mEq/L (ref 19–32)
Calcium: 8.4 mg/dL (ref 8.4–10.5)
Chloride: 107 mEq/L (ref 96–112)
Creatinine, Ser: 1.1 mg/dL (ref 0.4–1.5)
GFR calc Af Amer: >60 mL/min (ref >60)
GFR calc non Af Amer: >60 mL/min (ref >60)
Glucose, Bld: 89 mg/dL (ref 70–99)
Potassium: 3.6 mEq/L (ref 3.5–5.1)
Sodium: 139 mEq/L (ref 135–145)

## 2010-05-21 LAB — POCT I-STAT, CHEM 8
BUN: 6 mg/dL (ref 6–23)
Calcium, Ion: 1.14 mmol/L (ref 1.12–1.32)
Chloride: 102 mEq/L (ref 96–112)
Creatinine, Ser: 1.1 mg/dL (ref 0.4–1.5)
Glucose, Bld: 99 mg/dL (ref 70–99)
HCT: 45 % (ref 39.0–52.0)
Hemoglobin: 15.3 g/dL (ref 13.0–17.0)
Potassium: 3.1 mEq/L — ABNORMAL LOW (ref 3.5–5.1)
Sodium: 140 mEq/L (ref 135–145)
TCO2: 28 mmol/L (ref 0–100)

## 2010-05-21 LAB — DIFFERENTIAL
Basophils Absolute: 0 10*3/uL (ref 0.0–0.1)
Basophils Absolute: 0 10*3/uL (ref 0.0–0.1)
Basophils Absolute: 0 10*3/uL (ref 0.0–0.1)
Basophils Relative: 0 % (ref 0–1)
Basophils Relative: 0 % (ref 0–1)
Basophils Relative: 1 % (ref 0–1)
Eosinophils Absolute: 0.1 10*3/uL (ref 0.0–0.7)
Eosinophils Absolute: 0.1 10*3/uL (ref 0.0–0.7)
Eosinophils Absolute: 0.1 10*3/uL (ref 0.0–0.7)
Eosinophils Relative: 1 % (ref 0–5)
Eosinophils Relative: 1 % (ref 0–5)
Eosinophils Relative: 1 % (ref 0–5)
Lymphocytes Relative: 46 % (ref 12–46)
Lymphocytes Relative: 49 % — ABNORMAL HIGH (ref 12–46)
Lymphocytes Relative: 49 % — ABNORMAL HIGH (ref 12–46)
Lymphs Abs: 2.4 10*3/uL (ref 0.7–4.0)
Lymphs Abs: 2.5 10*3/uL (ref 0.7–4.0)
Lymphs Abs: 2.7 10*3/uL (ref 0.7–4.0)
Monocytes Absolute: 0.4 10*3/uL (ref 0.1–1.0)
Monocytes Absolute: 0.5 10*3/uL (ref 0.1–1.0)
Monocytes Absolute: 0.6 10*3/uL (ref 0.1–1.0)
Monocytes Relative: 11 % (ref 3–12)
Monocytes Relative: 8 % (ref 3–12)
Monocytes Relative: 9 % (ref 3–12)
Neutro Abs: 2.1 10*3/uL (ref 1.7–7.7)
Neutro Abs: 2.1 10*3/uL (ref 1.7–7.7)
Neutro Abs: 2.3 10*3/uL (ref 1.7–7.7)
Neutrophils Relative %: 38 % — ABNORMAL LOW (ref 43–77)
Neutrophils Relative %: 42 % — ABNORMAL LOW (ref 43–77)
Neutrophils Relative %: 43 % (ref 43–77)

## 2010-05-21 LAB — COMPREHENSIVE METABOLIC PANEL
ALT: 15 U/L (ref 0–53)
AST: 17 U/L (ref 0–37)
Albumin: 3.7 g/dL (ref 3.5–5.2)
Alkaline Phosphatase: 52 U/L (ref 39–117)
BUN: 6 mg/dL (ref 6–23)
CO2: 30 mEq/L (ref 19–32)
Calcium: 9.1 mg/dL (ref 8.4–10.5)
Chloride: 110 mEq/L (ref 96–112)
Creatinine, Ser: 1.14 mg/dL (ref 0.4–1.5)
GFR calc Af Amer: >60 mL/min (ref >60)
GFR calc non Af Amer: >60 mL/min (ref >60)
Glucose, Bld: 82 mg/dL (ref 70–99)
Potassium: 4.5 mEq/L (ref 3.5–5.1)
Sodium: 144 mEq/L (ref 135–145)
Total Bilirubin: 0.5 mg/dL (ref 0.3–1.2)
Total Protein: 6.4 g/dL (ref 6.0–8.3)

## 2010-05-21 LAB — URINALYSIS, ROUTINE W REFLEX MICROSCOPIC
Bilirubin Urine: NEGATIVE
Bilirubin Urine: NEGATIVE
Glucose, UA: NEGATIVE mg/dL
Glucose, UA: NEGATIVE mg/dL
Hgb urine dipstick: NEGATIVE
Hgb urine dipstick: NEGATIVE
Ketones, ur: 15 mg/dL — AB
Ketones, ur: NEGATIVE mg/dL
Nitrite: NEGATIVE
Nitrite: NEGATIVE
Protein, ur: NEGATIVE mg/dL
Protein, ur: NEGATIVE mg/dL
Specific Gravity, Urine: 1.008 (ref 1.005–1.030)
Specific Gravity, Urine: 1.024 (ref 1.005–1.030)
Urobilinogen, UA: 0.2 mg/dL (ref 0.0–1.0)
Urobilinogen, UA: 0.2 mg/dL (ref 0.0–1.0)
pH: 6 (ref 5.0–8.0)
pH: 7 (ref 5.0–8.0)

## 2010-05-21 LAB — CBC
HCT: 41.3 % (ref 39.0–52.0)
HCT: 42.6 % (ref 39.0–52.0)
HCT: 44.3 % (ref 39.0–52.0)
Hemoglobin: 14 g/dL (ref 13.0–17.0)
Hemoglobin: 14.4 g/dL (ref 13.0–17.0)
Hemoglobin: 14.8 g/dL (ref 13.0–17.0)
MCHC: 33.5 g/dL (ref 30.0–36.0)
MCHC: 33.8 g/dL (ref 30.0–36.0)
MCHC: 34 g/dL (ref 30.0–36.0)
MCV: 92.9 fL (ref 78.0–100.0)
MCV: 93.2 fL (ref 78.0–100.0)
MCV: 93.9 fL (ref 78.0–100.0)
Platelets: 178 10*3/uL (ref 150–400)
Platelets: 189 10*3/uL (ref 150–400)
Platelets: 200 10*3/uL (ref 150–400)
RBC: 4.43 MIL/uL (ref 4.22–5.81)
RBC: 4.58 MIL/uL (ref 4.22–5.81)
RBC: 4.71 MIL/uL (ref 4.22–5.81)
RDW: 14.6 % (ref 11.5–15.5)
RDW: 14.6 % (ref 11.5–15.5)
RDW: 14.7 % (ref 11.5–15.5)
WBC: 5.1 10*3/uL (ref 4.0–10.5)
WBC: 5.3 10*3/uL (ref 4.0–10.5)
WBC: 5.5 10*3/uL (ref 4.0–10.5)

## 2010-05-21 LAB — LIPASE, BLOOD
Lipase: 16 U/L (ref 11–59)
Lipase: 17 U/L (ref 11–59)
Lipase: 23 U/L (ref 11–59)

## 2010-05-21 LAB — HEPATIC FUNCTION PANEL
ALT: 13 U/L (ref 0–53)
ALT: 17 U/L (ref 0–53)
AST: 17 U/L (ref 0–37)
AST: 19 U/L (ref 0–37)
Albumin: 3.5 g/dL (ref 3.5–5.2)
Albumin: 3.6 g/dL (ref 3.5–5.2)
Alkaline Phosphatase: 48 U/L (ref 39–117)
Alkaline Phosphatase: 52 U/L (ref 39–117)
Bilirubin, Direct: <0.1 mg/dL (ref 0.0–0.3)
Bilirubin, Direct: <0.1 mg/dL (ref 0.0–0.3)
Total Bilirubin: 0.6 mg/dL (ref 0.3–1.2)
Total Bilirubin: 0.7 mg/dL (ref 0.3–1.2)
Total Protein: 6 g/dL (ref 6.0–8.3)
Total Protein: 6.3 g/dL (ref 6.0–8.3)

## 2010-05-21 LAB — ETHANOL: Alcohol, Ethyl (B): 6 mg/dL (ref 0–10)

## 2010-05-22 LAB — URINALYSIS, ROUTINE W REFLEX MICROSCOPIC
Bilirubin Urine: NEGATIVE
Bilirubin Urine: NEGATIVE
Bilirubin Urine: NEGATIVE
Glucose, UA: NEGATIVE mg/dL
Glucose, UA: NEGATIVE mg/dL
Glucose, UA: NEGATIVE mg/dL
Hgb urine dipstick: NEGATIVE
Hgb urine dipstick: NEGATIVE
Hgb urine dipstick: NEGATIVE
Ketones, ur: NEGATIVE mg/dL
Ketones, ur: NEGATIVE mg/dL
Ketones, ur: NEGATIVE mg/dL
Nitrite: NEGATIVE
Nitrite: NEGATIVE
Nitrite: NEGATIVE
Protein, ur: NEGATIVE mg/dL
Protein, ur: NEGATIVE mg/dL
Protein, ur: NEGATIVE mg/dL
Specific Gravity, Urine: 1.009 (ref 1.005–1.030)
Specific Gravity, Urine: 1.022 (ref 1.005–1.030)
Specific Gravity, Urine: 1.013 (ref 1.005–1.030)
Urobilinogen, UA: 0.2 mg/dL (ref 0.0–1.0)
Urobilinogen, UA: 1 mg/dL (ref 0.0–1.0)
Urobilinogen, UA: 1 mg/dL (ref 0.0–1.0)
pH: 6 (ref 5.0–8.0)
pH: 6 (ref 5.0–8.0)
pH: 5.5 (ref 5.0–8.0)

## 2010-05-22 LAB — COMPREHENSIVE METABOLIC PANEL
ALT: 12 U/L (ref 0–53)
ALT: 15 U/L (ref 0–53)
AST: 17 U/L (ref 0–37)
AST: 19 U/L (ref 0–37)
Albumin: 3.4 g/dL — ABNORMAL LOW (ref 3.5–5.2)
Albumin: 3.6 g/dL (ref 3.5–5.2)
Alkaline Phosphatase: 49 U/L (ref 39–117)
Alkaline Phosphatase: 50 U/L (ref 39–117)
BUN: 4 mg/dL — ABNORMAL LOW (ref 6–23)
BUN: 6 mg/dL (ref 6–23)
CO2: 29 mEq/L (ref 19–32)
CO2: 30 mEq/L (ref 19–32)
Calcium: 8.8 mg/dL (ref 8.4–10.5)
Calcium: 8.8 mg/dL (ref 8.4–10.5)
Chloride: 105 mEq/L (ref 96–112)
Chloride: 110 mEq/L (ref 96–112)
Creatinine, Ser: 1.12 mg/dL (ref 0.4–1.5)
Creatinine, Ser: 1.17 mg/dL (ref 0.4–1.5)
GFR calc Af Amer: >60 mL/min (ref >60)
GFR calc Af Amer: >60 mL/min (ref >60)
GFR calc non Af Amer: >60 mL/min (ref >60)
GFR calc non Af Amer: >60 mL/min (ref >60)
Glucose, Bld: 89 mg/dL (ref 70–99)
Glucose, Bld: 99 mg/dL (ref 70–99)
Potassium: 4.2 mEq/L (ref 3.5–5.1)
Potassium: 4.5 mEq/L (ref 3.5–5.1)
Sodium: 140 mEq/L (ref 135–145)
Sodium: 140 mEq/L (ref 135–145)
Total Bilirubin: 0.4 mg/dL (ref 0.3–1.2)
Total Bilirubin: 0.5 mg/dL (ref 0.3–1.2)
Total Protein: 6.3 g/dL (ref 6.0–8.3)
Total Protein: 6.4 g/dL (ref 6.0–8.3)

## 2010-05-22 LAB — LIPASE, BLOOD
Lipase: 17 U/L (ref 11–59)
Lipase: 19 U/L (ref 11–59)

## 2010-05-22 LAB — CBC
HCT: 41.8 % (ref 39.0–52.0)
HCT: 43.1 % (ref 39.0–52.0)
Hemoglobin: 14 g/dL (ref 13.0–17.0)
Hemoglobin: 14.1 g/dL (ref 13.0–17.0)
MCHC: 32.8 g/dL (ref 30.0–36.0)
MCHC: 33.4 g/dL (ref 30.0–36.0)
MCV: 94.2 fL (ref 78.0–100.0)
MCV: 94.4 fL (ref 78.0–100.0)
Platelets: 186 10*3/uL (ref 150–400)
Platelets: 201 10*3/uL (ref 150–400)
RBC: 4.44 MIL/uL (ref 4.22–5.81)
RBC: 4.56 MIL/uL (ref 4.22–5.81)
RDW: 14.8 % (ref 11.5–15.5)
RDW: 15.1 % (ref 11.5–15.5)
WBC: 3.9 10*3/uL — ABNORMAL LOW (ref 4.0–10.5)
WBC: 5.1 10*3/uL (ref 4.0–10.5)

## 2010-05-22 LAB — DIFFERENTIAL
Basophils Absolute: 0 10*3/uL (ref 0.0–0.1)
Basophils Absolute: 0 10*3/uL (ref 0.0–0.1)
Basophils Relative: 0 % (ref 0–1)
Basophils Relative: 1 % (ref 0–1)
Eosinophils Absolute: 0 10*3/uL (ref 0.0–0.7)
Eosinophils Absolute: 0.1 10*3/uL (ref 0.0–0.7)
Eosinophils Relative: 1 % (ref 0–5)
Eosinophils Relative: 2 % (ref 0–5)
Lymphocytes Relative: 40 % (ref 12–46)
Lymphocytes Relative: 50 % — ABNORMAL HIGH (ref 12–46)
Lymphs Abs: 1.9 10*3/uL (ref 0.7–4.0)
Lymphs Abs: 2 10*3/uL (ref 0.7–4.0)
Monocytes Absolute: 0.4 10*3/uL (ref 0.1–1.0)
Monocytes Absolute: 0.5 10*3/uL (ref 0.1–1.0)
Monocytes Relative: 10 % (ref 3–12)
Monocytes Relative: 9 % (ref 3–12)
Neutro Abs: 1.6 10*3/uL — ABNORMAL LOW (ref 1.7–7.7)
Neutro Abs: 2.4 10*3/uL (ref 1.7–7.7)
Neutrophils Relative %: 40 % — ABNORMAL LOW (ref 43–77)
Neutrophils Relative %: 47 % (ref 43–77)

## 2010-05-22 LAB — URINE CULTURE
Colony Count: NO GROWTH
Culture: NO GROWTH

## 2010-05-23 LAB — DIFFERENTIAL
Basophils Absolute: 0 10*3/uL (ref 0.0–0.1)
Basophils Absolute: 0 10*3/uL (ref 0.0–0.1)
Basophils Absolute: 0 10*3/uL (ref 0.0–0.1)
Basophils Relative: 0 % (ref 0–1)
Basophils Relative: 1 % (ref 0–1)
Basophils Relative: 1 % (ref 0–1)
Eosinophils Absolute: 0 10*3/uL (ref 0.0–0.7)
Eosinophils Absolute: 0 10*3/uL (ref 0.0–0.7)
Eosinophils Absolute: 0.1 10*3/uL (ref 0.0–0.7)
Eosinophils Relative: 1 % (ref 0–5)
Eosinophils Relative: 1 % (ref 0–5)
Eosinophils Relative: 1 % (ref 0–5)
Lymphocytes Relative: 45 % (ref 12–46)
Lymphocytes Relative: 37 % (ref 12–46)
Lymphocytes Relative: 42 % (ref 12–46)
Lymphs Abs: 1.7 10*3/uL (ref 0.7–4.0)
Lymphs Abs: 1.6 10*3/uL (ref 0.7–4.0)
Lymphs Abs: 1.9 10*3/uL (ref 0.7–4.0)
Monocytes Absolute: 0.4 10*3/uL (ref 0.1–1.0)
Monocytes Absolute: 0.5 10*3/uL (ref 0.1–1.0)
Monocytes Absolute: 0.6 10*3/uL (ref 0.1–1.0)
Monocytes Relative: 10 % (ref 3–12)
Monocytes Relative: 11 % (ref 3–12)
Monocytes Relative: 15 % — ABNORMAL HIGH (ref 3–12)
Neutro Abs: 1.6 10*3/uL — ABNORMAL LOW (ref 1.7–7.7)
Neutro Abs: 1.6 10*3/uL — ABNORMAL LOW (ref 1.7–7.7)
Neutro Abs: 2.6 10*3/uL (ref 1.7–7.7)
Neutrophils Relative %: 44 % (ref 43–77)
Neutrophils Relative %: 41 % — ABNORMAL LOW (ref 43–77)
Neutrophils Relative %: 51 % (ref 43–77)

## 2010-05-23 LAB — COMPREHENSIVE METABOLIC PANEL
ALT: 15 U/L (ref 0–53)
ALT: 12 U/L (ref 0–53)
ALT: 15 U/L (ref 0–53)
ALT: 17 U/L (ref 0–53)
AST: 16 U/L (ref 0–37)
AST: 15 U/L (ref 0–37)
AST: 17 U/L (ref 0–37)
AST: 17 U/L (ref 0–37)
Albumin: 3.7 g/dL (ref 3.5–5.2)
Albumin: 3.2 g/dL — ABNORMAL LOW (ref 3.5–5.2)
Albumin: 3.7 g/dL (ref 3.5–5.2)
Albumin: 3.8 g/dL (ref 3.5–5.2)
Alkaline Phosphatase: 53 U/L (ref 39–117)
Alkaline Phosphatase: 48 U/L (ref 39–117)
Alkaline Phosphatase: 53 U/L (ref 39–117)
Alkaline Phosphatase: 55 U/L (ref 39–117)
BUN: 6 mg/dL (ref 6–23)
BUN: 7 mg/dL (ref 6–23)
BUN: 7 mg/dL (ref 6–23)
BUN: 8 mg/dL (ref 6–23)
CO2: 28 mEq/L (ref 19–32)
CO2: 26 mEq/L (ref 19–32)
CO2: 27 mEq/L (ref 19–32)
CO2: 32 mEq/L (ref 19–32)
Calcium: 9.3 mg/dL (ref 8.4–10.5)
Calcium: 8.3 mg/dL — ABNORMAL LOW (ref 8.4–10.5)
Calcium: 8.9 mg/dL (ref 8.4–10.5)
Calcium: 9.6 mg/dL (ref 8.4–10.5)
Chloride: 106 mEq/L (ref 96–112)
Chloride: 105 mEq/L (ref 96–112)
Chloride: 108 mEq/L (ref 96–112)
Chloride: 108 mEq/L (ref 96–112)
Creatinine, Ser: 1.03 mg/dL (ref 0.4–1.5)
Creatinine, Ser: 0.98 mg/dL (ref 0.4–1.5)
Creatinine, Ser: 1.04 mg/dL (ref 0.4–1.5)
Creatinine, Ser: 1.17 mg/dL (ref 0.4–1.5)
GFR calc Af Amer: >60 mL/min (ref >60)
GFR calc Af Amer: >60 mL/min (ref >60)
GFR calc Af Amer: >60 mL/min (ref >60)
GFR calc Af Amer: >60 mL/min (ref >60)
GFR calc non Af Amer: >60 mL/min (ref >60)
GFR calc non Af Amer: >60 mL/min (ref >60)
GFR calc non Af Amer: >60 mL/min (ref >60)
GFR calc non Af Amer: >60 mL/min (ref >60)
Glucose, Bld: 93 mg/dL (ref 70–99)
Glucose, Bld: 80 mg/dL (ref 70–99)
Glucose, Bld: 87 mg/dL (ref 70–99)
Glucose, Bld: 93 mg/dL (ref 70–99)
Potassium: 3.8 mEq/L (ref 3.5–5.1)
Potassium: 3.7 mEq/L (ref 3.5–5.1)
Potassium: 3.7 mEq/L (ref 3.5–5.1)
Potassium: 4.7 mEq/L (ref 3.5–5.1)
Sodium: 142 mEq/L (ref 135–145)
Sodium: 139 mEq/L (ref 135–145)
Sodium: 139 mEq/L (ref 135–145)
Sodium: 143 mEq/L (ref 135–145)
Total Bilirubin: 0.8 mg/dL (ref 0.3–1.2)
Total Bilirubin: 0.6 mg/dL (ref 0.3–1.2)
Total Bilirubin: 0.7 mg/dL (ref 0.3–1.2)
Total Bilirubin: 0.8 mg/dL (ref 0.3–1.2)
Total Protein: 6.8 g/dL (ref 6.0–8.3)
Total Protein: 5.8 g/dL — ABNORMAL LOW (ref 6.0–8.3)
Total Protein: 6.8 g/dL (ref 6.0–8.3)
Total Protein: 7 g/dL (ref 6.0–8.3)

## 2010-05-23 LAB — CBC
HCT: 42.2 % (ref 39.0–52.0)
HCT: 41.9 % (ref 39.0–52.0)
HCT: 43.3 % (ref 39.0–52.0)
HCT: 45.6 % (ref 39.0–52.0)
Hemoglobin: 14.4 g/dL (ref 13.0–17.0)
Hemoglobin: 14 g/dL (ref 13.0–17.0)
Hemoglobin: 14.7 g/dL (ref 13.0–17.0)
Hemoglobin: 15.5 g/dL (ref 13.0–17.0)
MCHC: 34.2 g/dL (ref 30.0–36.0)
MCHC: 33.4 g/dL (ref 30.0–36.0)
MCHC: 34 g/dL (ref 30.0–36.0)
MCHC: 34 g/dL (ref 30.0–36.0)
MCV: 92.1 fL (ref 78.0–100.0)
MCV: 92.6 fL (ref 78.0–100.0)
MCV: 92.8 fL (ref 78.0–100.0)
MCV: 93.9 fL (ref 78.0–100.0)
Platelets: 173 10*3/uL (ref 150–400)
Platelets: 188 10*3/uL (ref 150–400)
Platelets: 205 10*3/uL (ref 150–400)
Platelets: 211 10*3/uL (ref 150–400)
RBC: 4.58 MIL/uL (ref 4.22–5.81)
RBC: 4.46 MIL/uL (ref 4.22–5.81)
RBC: 4.67 MIL/uL (ref 4.22–5.81)
RBC: 4.92 MIL/uL (ref 4.22–5.81)
RDW: 14.7 % (ref 11.5–15.5)
RDW: 14.9 % (ref 11.5–15.5)
RDW: 15 % (ref 11.5–15.5)
RDW: 15.3 % (ref 11.5–15.5)
WBC: 3.8 10*3/uL — ABNORMAL LOW (ref 4.0–10.5)
WBC: 3.9 10*3/uL — ABNORMAL LOW (ref 4.0–10.5)
WBC: 4.1 10*3/uL (ref 4.0–10.5)
WBC: 5.2 10*3/uL (ref 4.0–10.5)

## 2010-05-23 LAB — URINALYSIS, ROUTINE W REFLEX MICROSCOPIC
Bilirubin Urine: NEGATIVE
Glucose, UA: NEGATIVE mg/dL
Hgb urine dipstick: NEGATIVE
Ketones, ur: NEGATIVE mg/dL
Nitrite: NEGATIVE
Protein, ur: NEGATIVE mg/dL
Specific Gravity, Urine: 1.022 (ref 1.005–1.030)
Urobilinogen, UA: 1 mg/dL (ref 0.0–1.0)
pH: 6.5 (ref 5.0–8.0)

## 2010-05-23 LAB — POCT CARDIAC MARKERS
CKMB, poc: <1 ng/mL — ABNORMAL LOW (ref 1.0–8.0)
Myoglobin, poc: 64.6 ng/mL (ref 12–200)
Troponin i, poc: <0.05 ng/mL (ref 0.00–0.09)

## 2010-05-23 LAB — LIPASE, BLOOD
Lipase: 13 U/L (ref 11–59)
Lipase: 18 U/L (ref 11–59)
Lipase: 18 U/L (ref 11–59)
Lipase: 19 U/L (ref 11–59)

## 2010-05-24 LAB — DIFFERENTIAL
Basophils Absolute: 0 10*3/uL (ref 0.0–0.1)
Basophils Absolute: 0 10*3/uL (ref 0.0–0.1)
Basophils Absolute: 0 10*3/uL (ref 0.0–0.1)
Basophils Relative: 1 % (ref 0–1)
Basophils Relative: 1 % (ref 0–1)
Basophils Relative: 1 % (ref 0–1)
Eosinophils Absolute: 0 10*3/uL (ref 0.0–0.7)
Eosinophils Absolute: 0 10*3/uL (ref 0.0–0.7)
Eosinophils Absolute: 0 10*3/uL (ref 0.0–0.7)
Eosinophils Relative: 1 % (ref 0–5)
Eosinophils Relative: 0 % (ref 0–5)
Eosinophils Relative: 0 % (ref 0–5)
Lymphocytes Relative: 46 % (ref 12–46)
Lymphocytes Relative: 35 % (ref 12–46)
Lymphocytes Relative: 46 % (ref 12–46)
Lymphs Abs: 2.3 10*3/uL (ref 0.7–4.0)
Lymphs Abs: 1.7 10*3/uL (ref 0.7–4.0)
Lymphs Abs: 1.8 10*3/uL (ref 0.7–4.0)
Monocytes Absolute: 0.6 10*3/uL (ref 0.1–1.0)
Monocytes Absolute: 0.4 10*3/uL (ref 0.1–1.0)
Monocytes Absolute: 0.5 10*3/uL (ref 0.1–1.0)
Monocytes Relative: 12 % (ref 3–12)
Monocytes Relative: 10 % (ref 3–12)
Monocytes Relative: 11 % (ref 3–12)
Neutro Abs: 1.9 10*3/uL (ref 1.7–7.7)
Neutro Abs: 1.7 10*3/uL (ref 1.7–7.7)
Neutro Abs: 2.6 10*3/uL (ref 1.7–7.7)
Neutrophils Relative %: 40 % — ABNORMAL LOW (ref 43–77)
Neutrophils Relative %: 43 % (ref 43–77)
Neutrophils Relative %: 54 % (ref 43–77)

## 2010-05-24 LAB — COMPREHENSIVE METABOLIC PANEL
ALT: 26 U/L (ref 0–53)
ALT: 38 U/L (ref 0–53)
ALT: 30 U/L (ref 0–53)
ALT: 34 U/L (ref 0–53)
AST: 17 U/L (ref 0–37)
AST: 63 U/L — ABNORMAL HIGH (ref 0–37)
AST: 19 U/L (ref 0–37)
AST: 22 U/L (ref 0–37)
Albumin: 3.1 g/dL — ABNORMAL LOW (ref 3.5–5.2)
Albumin: 3.7 g/dL (ref 3.5–5.2)
Albumin: 3.6 g/dL (ref 3.5–5.2)
Albumin: 3.6 g/dL (ref 3.5–5.2)
Alkaline Phosphatase: 58 U/L (ref 39–117)
Alkaline Phosphatase: 60 U/L (ref 39–117)
Alkaline Phosphatase: 55 U/L (ref 39–117)
Alkaline Phosphatase: 56 U/L (ref 39–117)
BUN: 3 mg/dL — ABNORMAL LOW (ref 6–23)
BUN: 7 mg/dL (ref 6–23)
BUN: 3 mg/dL — ABNORMAL LOW (ref 6–23)
BUN: 4 mg/dL — ABNORMAL LOW (ref 6–23)
CO2: 28 mEq/L (ref 19–32)
CO2: 28 mEq/L (ref 19–32)
CO2: 26 mEq/L (ref 19–32)
CO2: 29 mEq/L (ref 19–32)
Calcium: 8.6 mg/dL (ref 8.4–10.5)
Calcium: 8.8 mg/dL (ref 8.4–10.5)
Calcium: 8.6 mg/dL (ref 8.4–10.5)
Calcium: 9.2 mg/dL (ref 8.4–10.5)
Chloride: 105 mEq/L (ref 96–112)
Chloride: 106 mEq/L (ref 96–112)
Chloride: 107 mEq/L (ref 96–112)
Chloride: 107 mEq/L (ref 96–112)
Creatinine, Ser: 0.95 mg/dL (ref 0.4–1.5)
Creatinine, Ser: 1 mg/dL (ref 0.4–1.5)
Creatinine, Ser: 0.92 mg/dL (ref 0.4–1.5)
Creatinine, Ser: 1.03 mg/dL (ref 0.4–1.5)
GFR calc Af Amer: >60 mL/min (ref >60)
GFR calc Af Amer: >60 mL/min (ref >60)
GFR calc Af Amer: >60 mL/min (ref >60)
GFR calc Af Amer: >60 mL/min (ref >60)
GFR calc non Af Amer: >60 mL/min (ref >60)
GFR calc non Af Amer: >60 mL/min (ref >60)
GFR calc non Af Amer: >60 mL/min (ref >60)
GFR calc non Af Amer: >60 mL/min (ref >60)
Glucose, Bld: 87 mg/dL (ref 70–99)
Glucose, Bld: 96 mg/dL (ref 70–99)
Glucose, Bld: 89 mg/dL (ref 70–99)
Glucose, Bld: 91 mg/dL (ref 70–99)
Potassium: 3.8 mEq/L (ref 3.5–5.1)
Potassium: 6.2 mEq/L — ABNORMAL HIGH (ref 3.5–5.1)
Potassium: 3.4 mEq/L — ABNORMAL LOW (ref 3.5–5.1)
Potassium: 3.8 mEq/L (ref 3.5–5.1)
Sodium: 137 mEq/L (ref 135–145)
Sodium: 140 mEq/L (ref 135–145)
Sodium: 138 mEq/L (ref 135–145)
Sodium: 142 mEq/L (ref 135–145)
Total Bilirubin: 0.8 mg/dL (ref 0.3–1.2)
Total Bilirubin: 2.1 mg/dL — ABNORMAL HIGH (ref 0.3–1.2)
Total Bilirubin: 0.7 mg/dL (ref 0.3–1.2)
Total Bilirubin: 0.8 mg/dL (ref 0.3–1.2)
Total Protein: 6 g/dL (ref 6.0–8.3)
Total Protein: 6.4 g/dL (ref 6.0–8.3)
Total Protein: 6.5 g/dL (ref 6.0–8.3)
Total Protein: 6.6 g/dL (ref 6.0–8.3)

## 2010-05-24 LAB — URINALYSIS, ROUTINE W REFLEX MICROSCOPIC
Bilirubin Urine: NEGATIVE
Bilirubin Urine: NEGATIVE
Glucose, UA: NEGATIVE mg/dL
Glucose, UA: NEGATIVE mg/dL
Hgb urine dipstick: NEGATIVE
Hgb urine dipstick: NEGATIVE
Ketones, ur: NEGATIVE mg/dL
Ketones, ur: NEGATIVE mg/dL
Nitrite: NEGATIVE
Nitrite: NEGATIVE
Protein, ur: NEGATIVE mg/dL
Protein, ur: NEGATIVE mg/dL
Specific Gravity, Urine: 1.005 (ref 1.005–1.030)
Specific Gravity, Urine: 1.006 (ref 1.005–1.030)
Urobilinogen, UA: 0.2 mg/dL (ref 0.0–1.0)
Urobilinogen, UA: 0.2 mg/dL (ref 0.0–1.0)
pH: 8.5 — ABNORMAL HIGH (ref 5.0–8.0)
pH: 6.5 (ref 5.0–8.0)

## 2010-05-24 LAB — CBC
HCT: 38.7 % — ABNORMAL LOW (ref 39.0–52.0)
HCT: 40.1 % (ref 39.0–52.0)
HCT: 39.7 % (ref 39.0–52.0)
HCT: 40.6 % (ref 39.0–52.0)
Hemoglobin: 13 g/dL (ref 13.0–17.0)
Hemoglobin: 14 g/dL (ref 13.0–17.0)
Hemoglobin: 13.4 g/dL (ref 13.0–17.0)
Hemoglobin: 13.7 g/dL (ref 13.0–17.0)
MCHC: 33.6 g/dL (ref 30.0–36.0)
MCHC: 34.9 g/dL (ref 30.0–36.0)
MCHC: 33.6 g/dL (ref 30.0–36.0)
MCHC: 33.9 g/dL (ref 30.0–36.0)
MCV: 90.8 fL (ref 78.0–100.0)
MCV: 93.3 fL (ref 78.0–100.0)
MCV: 92.6 fL (ref 78.0–100.0)
MCV: 92.9 fL (ref 78.0–100.0)
Platelets: 165 10*3/uL (ref 150–400)
Platelets: 267 10*3/uL (ref 150–400)
Platelets: 195 10*3/uL (ref 150–400)
Platelets: 211 10*3/uL (ref 150–400)
RBC: 4.14 MIL/uL — ABNORMAL LOW (ref 4.22–5.81)
RBC: 4.42 MIL/uL (ref 4.22–5.81)
RBC: 4.29 MIL/uL (ref 4.22–5.81)
RBC: 4.37 MIL/uL (ref 4.22–5.81)
RDW: 14.9 % (ref 11.5–15.5)
RDW: 15.1 % (ref 11.5–15.5)
RDW: 14.3 % (ref 11.5–15.5)
RDW: 14.3 % (ref 11.5–15.5)
WBC: 4.5 10*3/uL (ref 4.0–10.5)
WBC: 4.9 10*3/uL (ref 4.0–10.5)
WBC: 3.9 10*3/uL — ABNORMAL LOW (ref 4.0–10.5)
WBC: 4.9 10*3/uL (ref 4.0–10.5)

## 2010-05-24 LAB — URINE MICROSCOPIC-ADD ON

## 2010-05-24 LAB — LIPASE, BLOOD
Lipase: 21 U/L (ref 11–59)
Lipase: 17 U/L (ref 11–59)
Lipase: 18 U/L (ref 11–59)

## 2010-05-24 LAB — BASIC METABOLIC PANEL
BUN: 5 mg/dL — ABNORMAL LOW (ref 6–23)
BUN: 6 mg/dL (ref 6–23)
CO2: 28 mEq/L (ref 19–32)
CO2: 30 mEq/L (ref 19–32)
Calcium: 8.2 mg/dL — ABNORMAL LOW (ref 8.4–10.5)
Calcium: 8.6 mg/dL (ref 8.4–10.5)
Chloride: 109 mEq/L (ref 96–112)
Chloride: 111 mEq/L (ref 96–112)
Creatinine, Ser: 1.01 mg/dL (ref 0.4–1.5)
Creatinine, Ser: 1.04 mg/dL (ref 0.4–1.5)
GFR calc Af Amer: >60 mL/min (ref >60)
GFR calc Af Amer: >60 mL/min (ref >60)
GFR calc non Af Amer: >60 mL/min (ref >60)
GFR calc non Af Amer: >60 mL/min (ref >60)
Glucose, Bld: 106 mg/dL — ABNORMAL HIGH (ref 70–99)
Glucose, Bld: 97 mg/dL (ref 70–99)
Potassium: 3.6 mEq/L (ref 3.5–5.1)
Potassium: 3.6 mEq/L (ref 3.5–5.1)
Sodium: 141 mEq/L (ref 135–145)
Sodium: 143 mEq/L (ref 135–145)

## 2010-05-24 LAB — POCT I-STAT, CHEM 8
BUN: 6 mg/dL (ref 6–23)
Calcium, Ion: 1.14 mmol/L (ref 1.12–1.32)
Chloride: 106 mEq/L (ref 96–112)
Creatinine, Ser: 1 mg/dL (ref 0.4–1.5)
Glucose, Bld: 91 mg/dL (ref 70–99)
HCT: 42 % (ref 39.0–52.0)
Hemoglobin: 14.3 g/dL (ref 13.0–17.0)
Potassium: 3.9 mEq/L (ref 3.5–5.1)
Sodium: 141 mEq/L (ref 135–145)
TCO2: 27 mmol/L (ref 0–100)

## 2010-05-24 LAB — RAPID URINE DRUG SCREEN, HOSP PERFORMED
Amphetamines: NOT DETECTED
Barbiturates: NOT DETECTED
Benzodiazepines: NOT DETECTED
Cocaine: NOT DETECTED
Opiates: NOT DETECTED
Tetrahydrocannabinol: NOT DETECTED

## 2010-05-24 LAB — GC/CHLAMYDIA PROBE AMP, URINE
Chlamydia, Swab/Urine, PCR: NEGATIVE
GC Probe Amp, Urine: NEGATIVE

## 2010-05-24 LAB — LACTIC ACID, PLASMA: Lactic Acid, Venous: 1.4 mmol/L (ref 0.5–2.2)

## 2010-05-25 LAB — DIFFERENTIAL
Basophils Absolute: 0 10*3/uL (ref 0.0–0.1)
Basophils Absolute: 0 10*3/uL (ref 0.0–0.1)
Basophils Relative: 0 % (ref 0–1)
Basophils Relative: 1 % (ref 0–1)
Eosinophils Absolute: 0 10*3/uL (ref 0.0–0.7)
Eosinophils Absolute: 0 10*3/uL (ref 0.0–0.7)
Eosinophils Relative: 0 % (ref 0–5)
Eosinophils Relative: 0 % (ref 0–5)
Lymphocytes Relative: 21 % (ref 12–46)
Lymphocytes Relative: 30 % (ref 12–46)
Lymphs Abs: 1.5 10*3/uL (ref 0.7–4.0)
Lymphs Abs: 1.6 10*3/uL (ref 0.7–4.0)
Monocytes Absolute: 0.5 10*3/uL (ref 0.1–1.0)
Monocytes Absolute: 0.5 10*3/uL (ref 0.1–1.0)
Monocytes Relative: 8 % (ref 3–12)
Monocytes Relative: 9 % (ref 3–12)
Neutro Abs: 4.9 10*3/uL (ref 1.7–7.7)
Neutro Abs: 3.2 10*3/uL (ref 1.7–7.7)
Neutrophils Relative %: 71 % (ref 43–77)
Neutrophils Relative %: 60 % (ref 43–77)

## 2010-05-25 LAB — URINALYSIS, ROUTINE W REFLEX MICROSCOPIC
Bilirubin Urine: NEGATIVE
Glucose, UA: NEGATIVE mg/dL
Glucose, UA: NEGATIVE mg/dL
Hgb urine dipstick: NEGATIVE
Hgb urine dipstick: NEGATIVE
Ketones, ur: NEGATIVE mg/dL
Ketones, ur: 15 mg/dL — AB
Nitrite: NEGATIVE
Nitrite: NEGATIVE
Protein, ur: NEGATIVE mg/dL
Protein, ur: NEGATIVE mg/dL
Specific Gravity, Urine: 1.023 (ref 1.005–1.030)
Specific Gravity, Urine: 1.03 (ref 1.005–1.030)
Urobilinogen, UA: 0.2 mg/dL (ref 0.0–1.0)
Urobilinogen, UA: 1 mg/dL (ref 0.0–1.0)
pH: 8 (ref 5.0–8.0)
pH: 6 (ref 5.0–8.0)

## 2010-05-25 LAB — POCT I-STAT, CHEM 8
BUN: 5 mg/dL — ABNORMAL LOW (ref 6–23)
Calcium, Ion: 1 mmol/L — ABNORMAL LOW (ref 1.12–1.32)
Chloride: 104 mEq/L (ref 96–112)
Creatinine, Ser: 1.1 mg/dL (ref 0.4–1.5)
Glucose, Bld: 96 mg/dL (ref 70–99)
HCT: 45 % (ref 39.0–52.0)
Hemoglobin: 15.3 g/dL (ref 13.0–17.0)
Potassium: 3.3 mEq/L — ABNORMAL LOW (ref 3.5–5.1)
Sodium: 138 mEq/L (ref 135–145)
TCO2: 24 mmol/L (ref 0–100)

## 2010-05-25 LAB — COMPREHENSIVE METABOLIC PANEL
ALT: 15 U/L (ref 0–53)
ALT: 21 U/L (ref 0–53)
AST: 18 U/L (ref 0–37)
AST: 18 U/L (ref 0–37)
Albumin: 3.8 g/dL (ref 3.5–5.2)
Albumin: 3.7 g/dL (ref 3.5–5.2)
Alkaline Phosphatase: 50 U/L (ref 39–117)
Alkaline Phosphatase: 53 U/L (ref 39–117)
BUN: 5 mg/dL — ABNORMAL LOW (ref 6–23)
BUN: 7 mg/dL (ref 6–23)
CO2: 30 mEq/L (ref 19–32)
CO2: 26 mEq/L (ref 19–32)
Calcium: 9 mg/dL (ref 8.4–10.5)
Calcium: 8.6 mg/dL (ref 8.4–10.5)
Chloride: 104 mEq/L (ref 96–112)
Chloride: 105 mEq/L (ref 96–112)
Creatinine, Ser: 0.99 mg/dL (ref 0.4–1.5)
Creatinine, Ser: 0.98 mg/dL (ref 0.4–1.5)
GFR calc Af Amer: >60 mL/min (ref >60)
GFR calc Af Amer: >60 mL/min (ref >60)
GFR calc non Af Amer: >60 mL/min (ref >60)
GFR calc non Af Amer: >60 mL/min (ref >60)
Glucose, Bld: 91 mg/dL (ref 70–99)
Glucose, Bld: 97 mg/dL (ref 70–99)
Potassium: 3.8 mEq/L (ref 3.5–5.1)
Potassium: 3.3 mEq/L — ABNORMAL LOW (ref 3.5–5.1)
Sodium: 139 mEq/L (ref 135–145)
Sodium: 140 mEq/L (ref 135–145)
Total Bilirubin: 0.6 mg/dL (ref 0.3–1.2)
Total Bilirubin: 0.6 mg/dL (ref 0.3–1.2)
Total Protein: 6.8 g/dL (ref 6.0–8.3)
Total Protein: 6.5 g/dL (ref 6.0–8.3)

## 2010-05-25 LAB — HEMOCCULT GUIAC POC 1CARD (OFFICE): Fecal Occult Bld: NEGATIVE

## 2010-05-25 LAB — CBC
HCT: 41.4 % (ref 39.0–52.0)
HCT: 40.9 % (ref 39.0–52.0)
Hemoglobin: 14 g/dL (ref 13.0–17.0)
Hemoglobin: 14.1 g/dL (ref 13.0–17.0)
MCHC: 33.8 g/dL (ref 30.0–36.0)
MCHC: 34.4 g/dL (ref 30.0–36.0)
MCV: 94.3 fL (ref 78.0–100.0)
MCV: 93.1 fL (ref 78.0–100.0)
Platelets: 193 10*3/uL (ref 150–400)
Platelets: 195 10*3/uL (ref 150–400)
RBC: 4.4 MIL/uL (ref 4.22–5.81)
RBC: 4.39 MIL/uL (ref 4.22–5.81)
RDW: 14.1 % (ref 11.5–15.5)
RDW: 14 % (ref 11.5–15.5)
WBC: 6.9 10*3/uL (ref 4.0–10.5)
WBC: 5.4 10*3/uL (ref 4.0–10.5)

## 2010-05-25 LAB — LIPASE, BLOOD
Lipase: 18 U/L (ref 11–59)
Lipase: 15 U/L (ref 11–59)

## 2010-06-23 ENCOUNTER — Emergency Department (HOSPITAL_COMMUNITY): Admission: EM | Admit: 2010-06-23 | Payer: Self-pay | Source: Home / Self Care

## 2010-06-24 ENCOUNTER — Ambulatory Visit (HOSPITAL_COMMUNITY)
Admission: RE | Admit: 2010-06-24 | Discharge: 2010-06-24 | Disposition: A | Discharge status: Home / Self Care | Payer: Medicare Other | Source: Ambulatory Visit | Attending: Internal Medicine | Admitting: Internal Medicine

## 2010-06-24 ENCOUNTER — Other Ambulatory Visit (HOSPITAL_COMMUNITY): Payer: Self-pay | Admitting: Internal Medicine

## 2010-06-24 DIAGNOSIS — R52 Pain, unspecified: Secondary | ICD-10-CM

## 2010-06-24 DIAGNOSIS — M25549 Pain in joints of unspecified hand: Secondary | ICD-10-CM | POA: Insufficient documentation

## 2010-06-24 DIAGNOSIS — M25539 Pain in unspecified wrist: Secondary | ICD-10-CM | POA: Insufficient documentation

## 2010-06-27 NOTE — Assessment & Plan Note (Signed)
[facility]                         GASTROENTEROLOGY OFFICE NOTE   NAME:[name], [name]                      MRN:          [id number]  DATE:11/26/2006                            DOB:          12-25-1970    [name] continues with fairly chronic left mid abdominal pain,  intermittent episodes of nausea and vomiting and dehydration requiring  emergency room visits. He is going to [employer] and is off of alcohol  and his CDT levels are down into the normal range. As per my previous  notes, he had endoscopic ultrasound documenting chronic pancreatitis  which failed to respond to temporarily celiac nerve blocks. We have some  blood work from his emergency room visit and he has had some mildly  abnormal transaminases at times. KUB done on July 29th was unremarkable  as was ultrasound on July 16th which showed no evidence of  cholelithiasis. He has had several CT scans and ultrasounds.   Previous trials of pancreatic extracts did not help his pain. He  currently is on omeprazole 20 mg a day and amitriptyline 50 mg at  bedtime with p.r.n. Percocet use which he is taking 3-4 times a day.  Despite all the above complaints, his appetite is good and his weight is  stable. He has had no lower GI or systemic complaints. He does have some  routinely noticed irregularly shaped heterogenous celiac nodes, which  also had been biopsied at the time of endoscopic ultrasound and showed  no malignancy.   PHYSICAL EXAMINATION:  Shows him to be a healthy-appearing black male  appearing his stated age in no acute distress. He weighs 184 pounds,  blood pressure 108/68, pulse 64 and regular.  I could not appreciate stigmata of chronic liver disease.  ABDOMEN: Examination was unremarkable without organomegaly, masses or  tenderness or abnormal bowel sounds.   RECOMMENDATIONS:  1. Continue avoidance of alcohol.  2. Continue pain control and I will ask Dr.  Thyra Breed,  whom I      spoke with on the phone today, to help in his pain management.  3. Repeat amylase and lipase.  4. Consider ERCP examination versus referral to tertiary medical      center.  5. Start Nexium 40 mg q a.m. with p.r.n. phenergan 25 mg suppositories      as needed.  6. GI followup in one months' time.     Vania Rea. Jarold Motto, MD, Caleen Essex, FAGA  Electronically Signed    DRP/MedQ  DD: 11/26/2006  DT: 11/27/2006  Job #: [id number]   cc:   Judie Petit. Vear Clock, M.D.  Angelia Mould. Derrell Lolling, M.D.  Rachael Fee, MD

## 2010-06-27 NOTE — Assessment & Plan Note (Signed)
[facility]                         GASTROENTEROLOGY OFFICE NOTE   NAME:Schey, [name]                      MRN:          [id number]  DATE:10/25/2006                            DOB:          1970/10/10    Tinnie Gens continues with mid abdominal pain of unexplained etiology. I do  think there is good evidence to suggest chronic pancreatitis in that he  recently had a repeat endoscopic ultrasound on October 10, 2006 by Dr.  Christella Hartigan. Again pancreatic parenchyma was lobular and the main pancreatic  duct had echogenic borders consistent with chronic pancreatitis. The  patient also had a small cluster of irregularly shaped heterogenous  celiac nodes that had previously been noted. Exam otherwise was  unremarkable. Celiac block was performed using 10 cc of  0.025% Marcain  followed by 40 mg of Kenalog. This was performed to the right and left  celiac trunk take off. The patient however has had no response  whatsoever to this maneuver.   Trey Paula continues to complain of mid abdominal pain but has had no real  diarrhea, anorexia, or weight loss. He denies drinking but his CDT level  from October 17, 2006 was elevated at 2.9 consistent with continued  heavy ethanol use. His wife is with him today and she says he drinking  although the patient obviously can not come to the realization that he  is an alcoholic and continues to deny alcohol use which is somewhat  disappointing.   Patient is awake and alert, in no acute distress. He weighs 177 pounds  which is up 3 pounds from September 26, 2006. Blood pressure is 116/64 and  pulse was 80 and regular.  ABDOMINAL EXAM: Showed no organomegaly, masses, or tenderness.  His mental status was clear.  I could not appreciate stigmata of chronic liver disease.   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 Extensive workup has been negative otherwise.   RECOMMENDATIONS:  I have  explained to Tinnie Gens and his wife that I will  not continue to give him narcotics if he does not go to AA meetings at  least twice a week and his wife is to validate this. I have renewed his  Percocet and I have also started him on amitriptyline 50 mg at bedtime.  If we can get good evidence that he has abstained from alcohol use we  hopefully will be able to get him into a pain control clinic. I will see  him again in 1 months time and check CDT level before his office visit.   ADDENDUM:  I have discussed with the patient and his wife the use  antabuse which she is in favor of but the patient refuses.     Vania Rea. Jarold Motto, MD, Caleen Essex, FAGA  Electronically Signed    DRP/MedQ  DD: 10/25/2006  DT: 10/25/2006  Job #: [id number]

## 2010-06-27 NOTE — Assessment & Plan Note (Signed)
[facility]                         GASTROENTEROLOGY OFFICE NOTE   OVILA, LEPAGE                   MRN:          [id number]  DATE:03/19/2007                            DOB:          19-Dec-1970    I spoke with my nursing assistant, Ventura Bruns, about [name]  and his continued psychiatric difficulties.  As per my previous notes, I  have started him on anti depressives and limited amount of pain  medication until he could establish with mental health at the [facility] mental health facilities.  The patient, apparently, has refused  to contact these facilities or behavioral health at [facility].  He, apparently, continues to have abdominal pain which is way out of  proportion to any objective findings.  Apparently has been given  Dilaudid 2 mg 1 to 2 every 4 to 6 hours per [facility].  I am  not willing to give this patient Dilaudid in addition to his Percocet,  since I do not think this is indicated.   His wife, now, is upset, and says that his problems are all related to  him taking methadone through Dr. Vear Clock at the pain clinic.  This is  absurd, and I think is a threat on her part which certainly explains why  he was discharged from the care of the pain clinic and Dr. Vear Clock'  practice.  We have also advised Mr. Elms since he does not have  insurance that he should seek [facility] care in [place].  I have  also advised him that he could follow up with Dr. Fayrene Fearing C. Little who is  his primary care physician.   I think we have accomplished all that we can from a gastrointestinal  standpoint for this particular patient.  His diagnosis of chronic  pancreatitis is somewhat suspect in any case, and certainly his most  recent problems are way out of proportion to any gastrointestinal  problems.  He really needs psychiatric help with his drug  and alcohol  addiction, severe depression, and  avenues of  care are available as per  our recommendations.     Vania Rea. Jarold Motto, MD, Caleen Essex, FAGA  Electronically Signed    DRP/MedQ  DD: 03/19/2007  DT: 03/20/2007  Job #: [id number]

## 2010-06-27 NOTE — Discharge Summary (Signed)
NAMEJAIQUAN, [name] NO.:  [id number]   MEDICAL RECORD NO.:  [id number]          PATIENT TYPE:  INP   LOCATION:  5121                         FACILITY:  [facility]   PHYSICIAN:  Ruthy Dick, MD    DATE OF BIRTH:  September 21, 1970   DATE OF ADMISSION:  06/02/2008  DATE OF DISCHARGE:  06/05/2008                               DISCHARGE SUMMARY   REASON FOR ADMISSION:  Abdominal pain and nausea and vomiting.   FINAL DISCHARGE DIAGNOSES:  1. Gastritis.  2. Chronic pancreatitis.  3. Acute on chronic abdominal pain, probably due to pancreatitis.  4. Narcotic dependence.  5. Chronic pain syndrome in the abdomen.  6. Alcohol abuse.  7. Gastroesophageal reflux disease.   PROCEDURE DURING THIS ADMISSION:  None.   CONSULTATIONS DURING THIS ADMISSION:  None.   BRIEF HISTORY OF PRESENT ILLNESS AND HOSPITAL COURSE:  This is 40-year-  old African American male with a history of alcohol abuse, chronic  pancreatitis and gastroesophageal reflux disease who came into the  hospital with severe abdominal pain of about 3 days' duration.  On  presentation, he had told the emergency department that he had run out  of his pain medication but when I saw him on the floor, I assume, he  said that he has some of his medication left.  In fact, he noted that he  had seen his primary care physician at [facility] about a week prior to  this presentation but says his major problem was that of nausea and  vomiting.  In any case, he was admitted and kept n.p.o. overnight with  his symptoms resolving.  As of yesterday, the patient was able to  tolerate clear liquids and as of today is tolerating full diet with no  nausea, no vomiting, whatsoever.  He is stable enough to be discharged  home and has been advised to abstain from use of spices.   He is to go home on:  1. Percocet 5/325 mg p.o. q.6 h. p.r.n., 20 tablets will be      prescribed.  2. Zofran 4 mg p.o. q.6 h. p.r.n. nausea and  vomiting.  3. He is also to start taking pancrelipase 2 tablets p.o. t.i.d. with      meals.  4. Pepcid 20 mg p.o. b.i.d.   He is to follow up with his primary care physician at [facility] as  scheduled and is to call [facility] for his refill of pain medications.   PHYSICAL EXAMINATION:  VITAL SIGNS:  Today are temperature 98.9, pulse  62, respirations 20, blood pressure 102/59, saturating 99% on room air.  CHEST:  Clear to auscultation bilaterally.  ABDOMEN:  Soft, nontender.  EXTREMITIES:  There is no clubbing, cyanosis, or edema.  CARDIOVASCULAR:  First and second heart sounds only.  CENTRAL NERVOUS SYSTEM:  Nonfocal.   TIME USED FOR DISCHARGE PLANNING:  Greater than 30 minutes.      Ruthy Dick, MD  Electronically Signed     GU/MEDQ  D:  06/05/2008  T:  06/05/2008  Job:  [id number]   cc:   Dala Dock

## 2010-06-27 NOTE — Assessment & Plan Note (Signed)
[facility]                         GASTROENTEROLOGY OFFICE NOTE   [name], [name]                   MRN:          [id number]  DATE:03/25/2007                            DOB:          Jun 27, 1970    [name] continues with a dull discomfort in his left midabdominal area.  One of his main complaints today is continued insomnia and chronic  depression but he denies suicidal ideation.  Nausea and vomiting is  better.  He is pretty much on a low-fiber diet with rice and ice cream.  Despite these complaints, he has had no significant anorexia or weight  loss.  He has been in the emergency room on several occasions.  Because  of unrelenting pain he has been given Dilaudid, which he takes 4 mg  twice a day.   As per my previous notes, we have advised him on multiple occasions to  establish primary care at either [facility], with Dr. Aida Puffer, his  primary care physician, or to seek psychiatric evaluation at mental  health clinics either through [facility] or the county health  service.  To date, they have not followed these suggestions.   [name] denies fever, chills, melena, hematochezia, or other systemic  complaints.  Lab data from his last clinic was all normal, including  CBC, metabolic profile, amylase, lipase, and TSH.  He is taking daily  Prevacid at this time.   He is awake and alert, in no acute distress.  He does have a fine tremor  but he does not appear combative, in withdrawal, or hallucinatory.  He  was oriented x3.  He weighs 195 pounds, which is up 5 pounds from his previous weight, and  blood pressure is 118/60 and pulse was 104 and regular.  I could not appreciate stigmata of chronic liver disease.  Abdominal exam was entirely benign.   ASSESSMENT:  1. Suspected chronic pancreatitis with chronic pain syndrome and      current narcotic dependency.  The patient, again in the presence of      his partner, denies  continued use of alcohol.  He does certainly      have a past history of alcohol abuse.  2. Chronic depression exacerbated by drug dependency.  3. Severe insomnia exacerbating the above-mentioned problems.  4. Chronic gastroesophageal reflux disease.  5. Chronic suspected withdrawal syndrome.  The patient and his partner      say that he is not taking Percocet in addition to Dilaudid, which      he has been given through the emergency room.   RECOMMENDATIONS:  1. Repeat abdominal CT scan.  This has not been performed in over a      year.  2. I prescribed Zoloft 100 mg in the morning with trazodone 100 mg at      bedtime to help him sleep.  3. Ambien CR 12.5 mg at bedtime.  4. Dilaudid 4 mg twice a day as tolerated.  5. The patient and his partner have promised that he will seek mental      health evaluation before his follow-up clinic visit in 3 weeks.  Vania Rea. Jarold Motto, MD, Caleen Essex, FAGA  Electronically Signed    DRP/MedQ  DD: 03/25/2007  DT: 03/27/2007  Job #: [id number]

## 2010-06-27 NOTE — Letter (Signed)
May 06, 2007    [name]  [address]  [place], [place] [address]   RE:  [name], [name]  MRN:  [id number]  /  DOB:  [birth year]   Dear [name]:   I feel it is most prudent that we withdraw from your medical care at  this time since I think there is very little that [facility] has to offer you in terms of your future management.  Recent CT scan of your abdomen has shown no evidence of progressive  pancreatitis or other lesions to explain your abdominal pain.  Also at  this time, you are under care at [facility] and also  should seek primary care from your primary care physician, Dr. Aida Puffer.   We will no longer be providing narcotics to you, but this should be  coordinated with your pain management center and your primary care  physician.  We will be glad to forward you the name of any physicians in  this area that you deem appropriate.    Sincerely,      Vania Rea. Jarold Motto, MD, Caleen Essex, FAGA  Electronically Signed    DRP/MedQ  DD: 05/06/2007  DT: 05/06/2007  Job #: [id number]

## 2010-06-27 NOTE — Assessment & Plan Note (Signed)
[facility]                         GASTROENTEROLOGY OFFICE NOTE   NAME:[name]                      MRN:          [id number]  DATE:09/26/2006                            DOB:          1970/09/26    [name] is a 40 year old black male alcoholic who has had  suspected acute relapsing pancreatitis.  He has chronic midabdominal  pain only helped by narcotic administration.  He has had a 10-pound  weight loss, but otherwise denies GI complaints.  He has had a thorough  GI workup, including endoscopy, colonoscopy, and several CT scans which  have shown nonspecific mesenteric adenopathy.  He had endoscopic  ultrasound with node biopsy on March 21, 2006 by Dr. Christella Hartigan, and there  was no evidence of malignancy.  Lab data in the past has all otherwise  been unremarkable, except for an elevated CDT, consisting with ongoing  alcohol use.  He has had biopsies of his small intestine, ileum, and  colon, all of which have been normal.  He at one point did have some  minor abnormalities of liver function tests, and complete hepatic workup  and serologies were all negative.  This was felt to most likely be a  reaction to alcohol abuse.  The patient has also had an upper GI and  small bowel series in January 2008 which was entirely normal, without  any evidence of small bowel lesions.  Colonoscopy and endoscopy were  done in November 2007.   He recently was seen by an emergency room doctor and referred to Dr.  Derrell Lolling for possible umbilical hernia.  Dr. Derrell Lolling apparently saw this  patient and did not feel this was the case.  He has been seen recently  by Dr. Clarene Duke in [place] and had repeat CT scan of the abdomen that  showed no specific abnormalities.  He did have Helicobacter pylori  antibody test that was positive, and he was re-placed on PPI therapy  which he has been on multiple times without improvement.  The patient  also relates that Darvocet-N  100 does not help his pain.  When asked  about his alcohol intake, as usual he says, I've quit.   PHYSICAL EXAMINATION:  GENERAL:  He is an ill-appearing black male who  is certainly thinner than when previously seen.  VITAL SIGNS:  Weight is down 10 pounds to 174 pounds, blood pressure is  104/62, and pulse was 64 and regular.  SKIN:  I cannot appreciate stigmata of chronic liver disease.  CHEST:  Clear.  HEART:  He was in a regular rhythm, without murmurs, gallops, or rubs.  ABDOMEN:  I could not appreciate any definite organomegaly, masses, or  tenderness, and bowel sounds were active.   ASSESSMENT:  I am concerned that [name] has some type of occult  gastrointestinal malignancy with associated lymphadenopathy.  We had a  thorough workup today, including mesenteric node biopsies which have  been negative.  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  RECOMMENDATIONS:  1. Check CBC, amylase, lipase, liver profile, HIV status, and C19-9      pancreatic tumor marker.  2. Dr. Christella Hartigan has agreed to repeat his endoscopic ultrasound with      needle biopsy of his nodes and possible celiac ganglion steroid      block as a diagnostic maneuver.  3. I have given him some limited Percocet for use, explaining to him      that he has a chance of becoming narcotic dependent.  4. Continue empiric omeprazole.  I do not think this patient has      peptic ulcer disease per his previous endoscopy x2 which has been      normal, without evidence of gastritis or ulcerations.     Robert Rea. Jarold Motto, MD, Caleen Essex, FAGA  Electronically Signed    DRP/MedQ  DD: 09/26/2006  DT: 09/27/2006  Job #: [id number]   cc:   Rachael Fee, MD  Angelia Mould. Derrell Lolling, M.D.  Fayrene Fearing Little

## 2010-06-27 NOTE — Discharge Summary (Signed)
[name], [name] NO.:  [id number]   MEDICAL RECORD NO.:  [id number]          PATIENT TYPE:  IPS   LOCATION:  0507                          FACILITY:  [facility]   PHYSICIAN:  Anselm Jungling, MD  DATE OF BIRTH:  1970-09-06   DATE OF ADMISSION:  03/26/2007  DATE OF DISCHARGE:  03/29/2007                               DISCHARGE SUMMARY   IDENTIFYING DATA AND REASON FOR ADMISSION:  The patient is a 40 year old  male who was admitted to address depression, auditory hallucinations,  and chronic pain.  Stressors included his pain, unemployment and a  history of pancreatitis.  The patient came to [place] on a regimen of Celexa,  Reglan, Ambien, trazodone, Zoloft, and hydromorphone.  Please refer to  the admission note for further details pertaining to the symptoms,  circumstances and history that led to his hospitalization.  He was given  an initial Axis I diagnosis of depressive disorder NOS.   MEDICAL AND LABORATORY:  The patient reportedly had a history of chronic  pancreatitis and claimed that he was having daily severe pain from this.  His medical physician is Dr. Jarold Motto.  We contacted Dr. Jarold Motto with  the patient's permission.  Dr. Jarold Motto indicated that he was beginning  to have some doubts as to the necessity of opiate analgesics for the  patient's pancreatitis pain symptoms.  The patient was scheduled for an  abdominal ultrasound as part of further workup for pancreatitis, the  week following his stay in our inpatient psychiatric service.  Dr.  Jarold Motto told us that he did not intend to continue prescribing opiate  analgesics for [facility].  We learned this information on the third  hospital day.   The patient was medically and physically assessed by the psychiatric  nurse practitioner, who also spoke with Dr. Jarold Motto.  He was continued  on Reglan 10 mg daily, promethazine 25 mg q.6h. p.r.n. nausea.  He was  continued on Dilaudid 4 mg b.i.d., which is what  Dr. Jarold Motto had been  prescribing.  At the time of discharge, he was not issued a prescription  for any further Dilaudid.  The patient was informed of our conversation  with Dr. Jarold Motto, in which we were told that Dr. Jarold Motto would not  be prescribing further narcotic analgesics.  We told the patient that we  were concerned about opiate withdrawal, and offered to allow the patient  to stay longer for a medical detoxification from opiates.  The patient  declined this.   There was no evidence of acute pancreatitis during the patient's stay.   HOSPITAL COURSE:  The patient was admitted to the adult inpatient  psychiatric service.  He presented as a well-nourished, well-developed  male who was alert, fully oriented, and pleasant, but sad.  He admitted  to having had suicidal ideation prior to admission but wanted and  accepted help.  There were no signs or symptoms of psychosis or thought  disorder.  In  his initial interview with the undersigned, he made no  spontaneous mention of any problems related to pain.   The patient was treated with  a psychotropic regimen that included Zoloft  100 mg daily, trazodone 100 mg nightly, and Ambien CR 12.5 mg h.s. as  needed.  He was involved in therapeutic groups and activities.  On the  fourth hospital day, there was a family session involving the patient  and his fiancee.  They discussed the patient's need for support, and  aftercare arrangements.  The patient indicated that he was not having  any suicidal ideation at that time, and they were given information  regarding the crisis hotline and suicide prevention.   The patient was discharged on the fourth hospital day in good spirits.  He agreed to the following aftercare plan.   AFTERCARE:  The patient was to follow up at [facility], with an appointment with Dorothyann Gibbs on April 04, 2007.   DISCHARGE MEDICATIONS:  1. Dilaudid 4 mg b.i.d. (no prescription given),  follow-up with Dr.      Jarold Motto.  2. Do not continue Celexa.  3. Zoloft 100 mg daily.  4. Trazodone 100 mg nightly.  5. Reglan 10 mg daily.  6. Promethazine 25 mg p.o. q.6h. p.r.n. nausea.  7. Ambien CR 12.5 mg h.s. p.r.n. insomnia.  The patient was noted to have an adequate supply of all his medications  at the time of discharge.   DISCHARGE DIAGNOSES:  AXIS I:  Depressive disorder NOS, and mood  disorder complicated by medical condition.  AXIS II:  Deferred.  AXIS III:  Chronic pancreatitis.  AXIS IV:  Stressors severe.  AXIS V:  Global Assessment of Functioning on discharge 60.      Anselm Jungling, MD  Electronically Signed     SPB/MEDQ  D:  03/31/2007  T:  04/01/2007  Job:  [id number]

## 2010-06-27 NOTE — H&P (Signed)
NAMENICOLAAS, SAVO NO.:  [id number]   MEDICAL RECORD NO.:  [id number]          PATIENT TYPE:  INP   LOCATION:  1823                         FACILITY:  [facility]   PHYSICIAN:  Darryl D. Prime, MD    DATE OF BIRTH:  12-12-1970   DATE OF ADMISSION:  06/02/2008  DATE OF DISCHARGE:                              HISTORY & PHYSICAL   Patient is a full code.   CHIEF COMPLAINT:  Abdominal pain.   PRIMARY CARE PHYSICIAN:  [facility].   HISTORY OF PRESENT ILLNESS:  [name] is a 40 year old male with a  history of alcohol abuse, history of possible chronic pancreatitis,  history of chronic abdominal pain, history of gastroesophageal reflux  disease, who notes severe abdominal pain for the last 3 days.  The  patient is on chronic opioid medication for his abdominal pain and has  been coming every month or so to the emergency room for refills.  He has  not seen his physician at [facility] for this.  Because of this, his  pain medications have not been refilled.  His last refill here has been  on May 17, 2008, and the plan was to refill him until 05/27/2008.  The  patient notes abdominal pain for the last 3 days associated with  heartburn and nausea and vomiting every time he eats, nonbloody,  nonbilious, although he does note a small amount of blood on the last  episode of emesis.  He also notes recent diarrhea over the last day.  No  black or bloody stools.  The patient denies any fever.  He denies any  recent sick contacts.  He denies any recent travel or eating any exotic  foods.  The patient denies any alcohol use.  He has not had any alcohol  for the last 2 years.   Patient in the emergency room had x-rays of the abdomen showing the flat  and upright being abnormal.   Lactic acid was 1.4.  White count was 4.9.   PAST MEDICAL/SURGICAL HISTORY:  1. 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  2. Asthma.  3. Status post hernia repair as a child.  4. Chronic  pancreatitis, as above.   MEDICATIONS:  Vicodin p.r.n.  Please note for the medication details.   SOCIAL HISTORY:  Rare tobacco.  No alcohol for 2 years.  No illicit drug  use.   FAMILY HISTORY:  Negative for GI cancers.  Grandfather had prostate  cancer.   REVIEW OF SYSTEMS:  A 14-point review of systems is negative unless as  stated above.   PHYSICAL EXAMINATION:  VITAL SIGNS:  Temperature 98.9, blood pressure  144/79, pulse 78, respiratory rate 18, sats 99% on room air.  GENERAL:  The patient is a 40 year old male lying in bed in no acute  distress.  HEENT:  Normocephalic and atraumatic.  Pupils are equal, round and  reactive to light.  Extraocular movements are intact.  Oropharynx is  dry.  NECK:  Supple with no lymphadenopathy or thyromegaly.  No carotid  bruits.  LUNGS:  Clear to auscultation bilaterally.  CARDIOVASCULAR:  Regular rate and rhythm with no murmur.  ABDOMEN:  There is significant voluntary guarding upon relaxation of the  abdominal wall.  He has mild epigastric pain on deep palpation.  No  signs of rebound tenderness or other signs of an acute abdomen.  It is  not distended.  No hepatosplenomegaly.  EXTREMITIES:  No clubbing, cyanosis or edema.  NEUROLOGIC:  Alert and oriented x4 with cranial nerves II-XII grossly  intact.  Strength and sensation is intact.   LABORATORY DATA:  White count of 4.9, hemoglobin 14, hematocrit 40.1,  platelets 267, segs 40.  Sodium 141, potassium 3.9, chloride 91, bicarb  27, BUN 6, creatinine 1, glucose 91.  Urine drug screen was negative.  Urinalysis negative.  Lipase 21.   ASSESSMENT/PLAN:  This is a patient with a history of chronic pain and  has been on opioid analgesics for this.  He has recently run out, which  may be the cause of his abdominal pain, nausea and vomiting here now.  The differential diagnosis includes withdrawal from narcotics, abdominal  pain.  It is chronic and now uncontrolled because he is not on the   narcotics.  Peptic ulcer disease.  Early recurrent pancreatitis.  At  this time, he will be admitted and receive IV fluid analgesics, and  antiemetics.  He will be n.p.o. for now with IV proton pump inhibitors.  We will hemoccult his stools x3.  GI and DVT prophylaxis will be  ordered.  Alcohol withdrawal precautions will be utilized.  Will place  him on multivitamin, thiamine, and folate, and see how he does  conservatively.      Darryl D. Prime, MD  Electronically Signed     DDP/MEDQ  D:  06/03/2008  T:  06/03/2008  Job:  [id number]

## 2010-06-27 NOTE — Assessment & Plan Note (Signed)
[facility]                         GASTROENTEROLOGY OFFICE NOTE   [name], [name]                   MRN:          [id number]  DATE:03/11/2007                            DOB:          1970/10/31    [name] has been denied followup visits at the pain clinic because  of failure to pay his co payment cost as suggested.  He has been off of  methadone now for the past month, and has been undergoing withdrawal  symptoms with anxiety, depression, low-grade fever and chills,  tremulousness, and increased abdominal pain.  I have a note from [facility] emergency room dated January 13, 2007, at which time he was  seen for abdominal pain and had normal laboratory parameters including  liver function tests per Dr. Patrica Duel.  Drug screen at that time was  unremarkable.  The patient had acute abdominal series which showed a  focal ileus but otherwise was unremarkable.  Chest x-ray was clear.  Amylase and lipase were normal.   I have been sending [name] to the pain clinic because of his suspected  chronic pancreatitis with chronic abdominal pain and narcotic  dependency.  He had been doing well, but has had, as mentioned above,  financial problems and has had recurrence of his pain and symptoms.  The  patient, to me, denies use of alcohol or drugs, but has had rather  severe anxiety.  Has had some depression, but, to me, he denies suicide  thoughts.  He and his wife and I had a rather long talk about his  situation, and the patient, again, reiterated that he is not suicidal,  is not having any delusions or hallucinations.  He also denies continued  alcohol or drug use.  He has been able to follow a low-fat diet and has  been drinking lots of fluids.  He is not currently on any medications  whatsoever.   He weighs 191 pounds which is pretty much his normal weight, and blood  pressure 124/74, pulse is 105 and regular.  He was somewhat clammy and  sweaty and very tremulous.  His chest was clear and he appeared to be in a regular rhythm without  murmurs, gallops, or rubs.  His abdomen showed no definite masses, tenderness, or organomegaly.  He  did have positive bowel sounds.  Peripheral extremities were unremarkable.  He is oriented x3 and was in no acute distress.  He appeared slightly  agitated, but did not appear to be in any hyperactive state.   ASSESSMENT:  1. Low-grade chronic withdrawal from drugs and alcohol.  2. Poor social situation without financial coverage for health care at      this time.Will refer to [facility] For Primary Care !!!!  3. History of chronic alcoholism and chronic pancreatitis with      associated chronic abdominal pain syndrome.  The patient has been      discharged from the pain clinic.  4. Rather marked anxiety and depression without suicidal tendencies.      The patient refuses psychiatric evaluation at [facility] or      at the [facility].  5. Mild acid reflux symptoms.  This is managed by Prevacid therapy.   RECOMMENDATIONS:  1. Repeat screening laboratory parameters.  2. I have urged the patient to rehydrate himself with Gatorade, colas,      and juices.  3. I have prescribed, again, Percocet for him every 6 hours on an      acute basis until we can come to some decision about his chronic      pain management.  4. Prevacid samples given to be taken 1 to 2 times a day as needed.  5. As needed Phenergan suppositories every 6 to 8 hours.  6. I have started Celexa 40 mg a day for depression.  7. Our office will need to help the patient's wife apply for Medicaid      benefits so he can receive some ongoing psychiatric care.     Vania Rea. Jarold Motto, MD, Caleen Essex, FAGA  Electronically Signed    DRP/MedQ  DD: 03/11/2007  DT: 03/12/2007  Job #: [id number]   cc:   Aida Puffer

## 2010-06-30 NOTE — Assessment & Plan Note (Signed)
[facility]                         GASTROENTEROLOGY OFFICE NOTE   VASILIOS, OTTAWAY                   MRN:          [id number]  DATE:04/11/2006                            DOB:          24-Sep-1970    DATE OF VISIT:  April 11, 2006   HISTORY OF PRESENT ILLNESS:  [name] is doing well off of alcohol.  He  complains that in the morning he throws up approximately 30 cc of yellow  material, but no food products.  He is not having reflux symptoms or  dysphagia or any significant anorexia or weight loss when compared to  his previous weights.  He has continued to avoid ethanol but says he has  recently been drinking a beer at bedtime because this helps.   He had an endoscopic ultrasound performed with biopsy of celiac lymph  nodes by Dr. Wendall Papa on March 21, 2006. The lymph node biopsies  were normal.  Endoscopic ultrasound exam otherwise, per Dr. Larae Grooms  report, was unremarkable except for a cluster of small celiac lymph  nodes and some larger mesenteric adenopathy that was biopsied.  Otherwise, this exam was unremarkable.  Repeat laboratory data showed  that his CDT is now decreased to 2, consistent with his cutting back on  his alcohol.  Previous CDT level was 2.7 on January 30, 2006, which is  over the normal limits.  As per my previous notes, trials of pancreatic  extract therapy have not alleviated his problem.  He no longer needs  analgesics for pain.   PHYSICAL EXAMINATION:  GENERAL APPEARANCE:  His exam today shows him to  be awake and alert in no acute distress.  VITAL SIGNS:  He weighs 177 pounds, which is down from 184 in December.  Blood pressure is 120/84.  Pulse 72 and regular.  A general physical  examination was not performed.   ASSESSMENT:  I think [name] had acute alcohol-induced pancreatitis  which initially caused all of his problems.  He currently seems to have  some problems with gastric emptying at bedtime.  He  has had extensive  underlying and extensive gastrointestinal work up which otherwise has  shown no evidence of significant pathology.   RECOMMENDATIONS:  1. Continue reflux regime.  2. Trial of Prevacid 30 mg at bedtime along with Reglan 10 mg at      bedtime.  3. Consider gastric emptying scan depending on clinical course.  4. Patient advised to continue strict avoidance of alcohol.  5. Also consider followup CT scan of the abdomen, again, depending on      his clinical course.     Vania Rea. Jarold Motto, MD, Caleen Essex, FAGA  Electronically Signed    DRP/MedQ  DD: 04/11/2006  DT: 04/11/2006  Job #: [id number]   cc:   Bryon Lions, MD

## 2010-06-30 NOTE — Assessment & Plan Note (Signed)
[facility]                         GASTROENTEROLOGY OFFICE NOTE   NAKIA, KOBLE                   MRN:          [id number]  DATE:02/26/2006                            DOB:          1970/06/09    [name] continues to complain of pain, which is really located more in  the suprapubic area, with intermittent nausea and vomiting. He otherwise  denies any complaints.   His workup has shown that he is a heavy drinker of alcohol with a CDT  being 2.7, consistent with heavy alcohol abuse for 6 weeks prior to this  test on January 09, 2006. Hepatitis B surface antigen and hepatitis C  antibody were negative. Upper abdominal ultrasound examination on  December 21, was entirely normal. Lab data otherwise had been  unremarkable. I have done endoscopy and colonoscopy, both of which have  been unremarkable including biopsies randomly. I did place him  empirically on Ultrace MT 20 tablets, which seemed to have helped some  of his mid-abdominal pain, but he is using a large amount of Darvocet N  100. He is also taking AcipHex 40 mg a day and Ativan from a source I am  not aware of.   He is a healthy-appearing black male in no distress. Certainly, in no  severe pain, appearing his stated age. He weighs 194 pounds with his  shoes and clothes on which is up some 5 pounds from his last clinic  visit and blood pressure is 118/76. Pulse was 68 and regular.  ABDOMEN: Was really unremarkable except for a small umbilical hernia.   ASSESSMENT:  I am not sure that [name] really has any organic medical  problems other than a history of heavy alcohol abuse. His mother is with  him and is adamant that further work up and therapy be undertaken.   RECOMMENDATIONS:  1. Check upper gastrointestinal and small bowel series.  2. If this is unremarkable, ask Dr. Christella Hartigan to do endoscopic ultrasound      to examine his pancreas.  3. I have reluctantly gave him Tylox 50  tablets today to use sparingly      for his severe pain.  4. Continue AcipHex and Ultrace MT 20.  5. Repeat CDT test for alcohol abuse. I have again advised this      patient to avoid alcohol, which I doubt he is doing.     Vania Rea. Jarold Motto, MD, Caleen Essex, FAGA  Electronically Signed    DRP/MedQ  DD: 02/26/2006  DT: 02/26/2006  Job #: [id number]   cc:   Aida Puffer

## 2010-06-30 NOTE — Assessment & Plan Note (Signed)
[facility]                         GASTROENTEROLOGY OFFICE NOTE   Robert, Maddox                   MRN:          [id number]  DATE:01/25/2006                            DOB:          11-17-1970    [name] is doing better on AcipHex therapy and Ativan. He continued to  complain of epigastric pain mostly in the left upper quadrant going  straight through to his back. Says this has been present for several  years. His endoscopy and colonoscopy were fairly unremarkable. As a  result of his laboratory data __________ continued elevation of liver  function tests. These are significantly elevated with an SGOT 44 and  SGPT 124. This pattern is not consistent with alcoholic hepatitis. CTD  (carbohydrate deficient transferrin) is pending.   PERTINENT LABORATORY DATA:  Showed a negative Monospot, normal ferritin,  iron levels, amylase, lipase, B12, and folate levels and a negative  hepatitis B surface antigen, negative hepatitis C antibody.   The patient again denies any heavy alcohol use within the last year, but  apparently used to be a heavy drinker. He denies any other complaints at  this time. As from our previous notes he did have a previous abnormal CT  scan.   PHYSICAL EXAMINATION:  GENERAL:  He is awake and alert and in no acute  distress. He certainly does not appear toxic or ill.  VITAL SIGNS:  Weighs 184 pounds, blood pressure 110/70, pulse 68 and  regular.  ABDOMEN:  Entirely benign without hepatosplenomegaly, masses or  tenderness. Bowel sounds were entirely normal.  NEUROLOGICAL:  Mental status was clear.   ASSESSMENT:  [name] abdominal pain certainly sounds like chronic  pancreatitis. He has, otherwise, had a rather negative workup. I think  the longevity of his problems speaks against pancreatic carcinoma.   RECOMMENDATIONS:  1. Repeat liver function tests and hepatic metabolic screening      parameters.  2. Upper abdominal  ultrasound exam.  3. Trial of Ultrase MT 20 one p.o. t.i.d. with meals.  4. Consider endoscopic ultrasound exam with Dr. Christella Hartigan.  5. Continue PPI therapy with office followup in 2-3 weeks time.     Vania Rea. Jarold Motto, MD, Caleen Essex, FAGA  Electronically Signed    DRP/MedQ  DD: 01/25/2006  DT: 01/25/2006  Job #: [id number]

## 2010-06-30 NOTE — Assessment & Plan Note (Signed)
[facility]                         GASTROENTEROLOGY OFFICE NOTE   [name], [name]                   MRN:          [id number]  DATE:05/14/2006                            DOB:          10-04-1970    [name] has gained 10 pounds in weight and no longer has upper abdominal  pain. Now he describes a constant dull aching sensation in his right  lower quadrant. He has no other GI symptoms except for some mild nausea,  belching and burping. He is having regular bowel movements without  melena or hematochezia. His appetite is good. He is having no dysphagia  or hepatobiliary complaints.   He weighs 187 pounds, which is up 10 pounds from February. Blood  pressure is 120/70 and pulse was 68 and regular.  ABDOMEN: Is entirely unremarkable.   ASSESSMENT:  [name]'s workup so far is showing that he probably had  some alcohol-induced acute pancreatitis, but may have an element of  chronic pancreatitis along with acid reflux disease. I do not think that  his current abdominal pain is of any significance since he has had a  negative workup including colonoscopy.   RECOMMENDATIONS:  1. Continue all medications as listed and reviewed in his chart.  2. Trial of Align probiotic therapy.  3. P.r.n. GI followup as needed with reassurance that he is doing      better.     Vania Rea. Jarold Motto, MD, Caleen Essex, FAGA  Electronically Signed    DRP/MedQ  DD: 05/14/2006  DT: 05/14/2006  Job #: [id number]

## 2010-06-30 NOTE — Assessment & Plan Note (Signed)
[facility]                           GASTROENTEROLOGY OFFICE NOTE   NAME:Dillion, [name]                      MRN:          [id number]  DATE:01/04/2006                            DOB:          03-07-1970    Mr. 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 Aida Puffer for evaluation of  abdominal pain, nausea and vomiting.   With Mr. Bloodworth today is his girlfriend, who gives an adequate history.  The patient apparently has had mostly midabdominal pain for the last year  with early morning nausea and emesis of bilious material, chronic acid  reflux and dyspeptic-type symptoms.  In the morning he does have some  midabdominal pain which seems to be relieved by vomiting, but there is not  partially digested food.  He says during the day he does fairly well and  denies any hepatobiliary complaints or lower GI problems such as melena,  hematochezia or change in bowel habits.  He has had a 25-pound weight loss  over the last year, however.   He was seen by Dr. Laury Axon, and has had a predominance of lymphocytes on his  blood smear with 58% lymphs.  He underwent physical exam which showed a 2-cm  node in his left axilla.  He also felt possible mass in the abdominal area,  and obtained CT scan of the abdomen on December 28, 2005 at [facility].  This scan showed that the liver, spleen, kidneys and pancreas all  were normal and there was no ascites.  He did describe several dilated  central mesenteric lymph nodes along the course of the superior mesenteric  axis.  CT scan of the abdomen and pelvis otherwise was unremarkable.  Further review of lab data shows normal electrolytes and liver function  tests.  Actually, his white count was 3900, hemoglobin 13.7, and platelet  count 204,000.  There was 58% lymphocytosis.   Mr. Emond denies dysphagia, clay-colored stools, dark urine,  icterus,  fever or chills.  He gives no history of previous hepatitis or pancreatitis,  although apparently was a previous heavy abuser of alcohol.  He denies  illicit IV drug use.  He has been using p.r.n. antacids, but has not been on  over-the-counter H2 blockers or PPIs.   PAST MEDICAL HISTORY:  Really otherwise entirely noncontributory.   FAMILY HISTORY:  Remarkable for maternal grandfather had colon cancer at an  elderly age.  Multiple members with cardiovascular difficulties.   SOCIAL HISTORY:  He is single and lives with his girlfriend.  He has a high  school education.  He smokes heavily and continues to use alcohol.   REVIEW OF SYMPTOMS:  Negative for systemic complaints such as fever, chills,  swollen joints, or general malaise.  Review of systems otherwise  noncontributory.  He denies any cardiovascular, pulmonary, genitourinary or  neurological problems.   EXAMINATION:  Shows him to be a healthy-appearing black male, in no acute  distress.  I cannot appreciate stigmata of chronic liver disease.  He does  appear to  have an enlarged lymph node in his left axillary area and also  questionable fullness in the left supraclavicular area.  I cannot appreciate  thyromegaly.  CHEST:  Shows wheezes on both sides with some rhonchi in his right lung  base.  He seems to be in a regular rhythm without significant murmurs,  gallops or rubs.  ABDOMEN:  Showed no definite organomegaly, masses or tenderness.  Bowel  sounds were normal. Peripherial extremetie were unremarkable.  RECTAL:  Exam was deferred.   ASSESSMENT:  I agree with Dr. Clarene Duke that lymphoma would be a serious  consideration in Mr. Lilley systemic illness with some GI components.  He  does not appear to be severely ill at this time, but appears to have a more  indolent chronic GI process.   RECOMMENDATIONS:  1. Repeat screening laboratory parameters.  2. Consider lymph node biopsy.  3. Reflux regime along with  Acifex 20 mg twice daily 30 minutes before      meals.  4. Outpatient endoscopy and colonoscopy, and hopefully ileal intubation.  5. Avoidance of alcohol if at all possible.  6. Continue other medications as per Dr. Clarene Duke.     Vania Rea. Jarold Motto, MD, Caleen Essex, FAGA  Electronically Signed    DRP/MedQ  DD: 01/04/2006  DT: 01/04/2006  Job #: [id number]   cc:   Aida Puffer

## 2010-11-03 LAB — RAPID URINE DRUG SCREEN, HOSP PERFORMED
Amphetamines: NOT DETECTED
Barbiturates: NOT DETECTED
Benzodiazepines: POSITIVE — AB
Cocaine: NOT DETECTED
Opiates: POSITIVE — AB
Tetrahydrocannabinol: POSITIVE — AB

## 2010-11-03 LAB — COMPREHENSIVE METABOLIC PANEL
ALT: 216 — ABNORMAL HIGH
ALT: 151 — ABNORMAL HIGH
ALT: 45
AST: 107 — ABNORMAL HIGH
AST: 47 — ABNORMAL HIGH
AST: 28
Albumin: 3.5
Albumin: 3.8
Albumin: 3.3 — ABNORMAL LOW
Alkaline Phosphatase: 68
Alkaline Phosphatase: 63
Alkaline Phosphatase: 52
BUN: 5 — ABNORMAL LOW
BUN: 8
BUN: 5 — ABNORMAL LOW
CO2: 28
CO2: 27
CO2: 29
Calcium: 8.9
Calcium: 8.8
Calcium: 9
Chloride: 101
Chloride: 99
Chloride: 109
Creatinine, Ser: 0.86
Creatinine, Ser: 0.95
Creatinine, Ser: 0.95
GFR calc Af Amer: >60
GFR calc Af Amer: >60
GFR calc Af Amer: >60
GFR calc non Af Amer: >60
GFR calc non Af Amer: >60
GFR calc non Af Amer: >60
Glucose, Bld: 92
Glucose, Bld: 95
Glucose, Bld: 107 — ABNORMAL HIGH
Potassium: 4
Potassium: 3.7
Potassium: 3.8
Sodium: 138
Sodium: 132 — ABNORMAL LOW
Sodium: 142
Total Bilirubin: 0.9
Total Bilirubin: 0.5
Total Bilirubin: 0.4
Total Protein: 6.1
Total Protein: 6.4
Total Protein: 5.9 — ABNORMAL LOW

## 2010-11-03 LAB — URINALYSIS, ROUTINE W REFLEX MICROSCOPIC
Bilirubin Urine: NEGATIVE
Bilirubin Urine: NEGATIVE
Bilirubin Urine: NEGATIVE
Glucose, UA: NEGATIVE
Glucose, UA: NEGATIVE
Glucose, UA: NEGATIVE
Hgb urine dipstick: NEGATIVE
Hgb urine dipstick: NEGATIVE
Hgb urine dipstick: NEGATIVE
Ketones, ur: NEGATIVE
Ketones, ur: NEGATIVE
Ketones, ur: NEGATIVE
Nitrite: NEGATIVE
Nitrite: NEGATIVE
Nitrite: NEGATIVE
Protein, ur: NEGATIVE
Protein, ur: NEGATIVE
Protein, ur: NEGATIVE
Specific Gravity, Urine: 1.006
Specific Gravity, Urine: 1.025
Specific Gravity, Urine: 1.011
Urobilinogen, UA: 0.2
Urobilinogen, UA: 1
Urobilinogen, UA: 0.2
pH: 7.5
pH: 6.5
pH: 7

## 2010-11-03 LAB — CBC
HCT: 39
HCT: 43.2
HCT: 37.7 — ABNORMAL LOW
Hemoglobin: 13.4
Hemoglobin: 14.6
Hemoglobin: 13
MCHC: 34.4
MCHC: 33.8
MCHC: 34.3
MCV: 90.8
MCV: 92.2
MCV: 90.4
Platelets: 197
Platelets: 224
Platelets: 194
RBC: 4.29
RBC: 4.69
RBC: 4.17 — ABNORMAL LOW
RDW: 14.6
RDW: 14.6
RDW: 14.5
WBC: 5.4
WBC: 6.3
WBC: 4.3

## 2010-11-03 LAB — DIFFERENTIAL
Basophils Absolute: 0
Basophils Absolute: 0
Basophils Absolute: 0
Basophils Relative: 1
Basophils Relative: 0
Basophils Relative: 1
Eosinophils Absolute: 0.1
Eosinophils Absolute: 0.1
Eosinophils Absolute: 0
Eosinophils Relative: 1
Eosinophils Relative: 1
Eosinophils Relative: 1
Lymphocytes Relative: 39
Lymphocytes Relative: 39
Lymphocytes Relative: 44
Lymphs Abs: 2.1
Lymphs Abs: 2.5
Lymphs Abs: 1.9
Monocytes Absolute: 0.7
Monocytes Absolute: 0.7
Monocytes Absolute: 0.5
Monocytes Relative: 12
Monocytes Relative: 11
Monocytes Relative: 13 — ABNORMAL HIGH
Neutro Abs: 2.5
Neutro Abs: 3.1
Neutro Abs: 1.8
Neutrophils Relative %: 47
Neutrophils Relative %: 49
Neutrophils Relative %: 42 — ABNORMAL LOW

## 2010-11-03 LAB — ETHANOL: Alcohol, Ethyl (B): <5

## 2010-11-03 LAB — LIPASE, BLOOD
Lipase: 19
Lipase: 19

## 2010-11-07 LAB — URINALYSIS, ROUTINE W REFLEX MICROSCOPIC
Bilirubin Urine: NEGATIVE
Glucose, UA: NEGATIVE
Hgb urine dipstick: NEGATIVE
Ketones, ur: NEGATIVE
Nitrite: NEGATIVE
Protein, ur: NEGATIVE
Specific Gravity, Urine: 1.015
Urobilinogen, UA: 1
pH: 6

## 2010-11-07 LAB — COMPREHENSIVE METABOLIC PANEL
ALT: 24
AST: 25
Albumin: 3.6
Alkaline Phosphatase: 53
BUN: 8
CO2: 27
Calcium: 8.8
Chloride: 107
Creatinine, Ser: 1.23
GFR calc Af Amer: >60
GFR calc non Af Amer: >60
Glucose, Bld: 109 — ABNORMAL HIGH
Potassium: 4.2
Sodium: 139
Total Bilirubin: 0.8
Total Protein: 6.2

## 2010-11-07 LAB — CBC
HCT: 41.1
Hemoglobin: 14
MCHC: 34
MCV: 91.3
Platelets: 271
RBC: 4.5
RDW: 14
WBC: 4.5

## 2010-11-07 LAB — DIFFERENTIAL
Basophils Absolute: 0
Basophils Relative: 0
Eosinophils Absolute: 0
Eosinophils Relative: 1
Lymphocytes Relative: 41
Lymphs Abs: 1.8
Monocytes Absolute: 0.5
Monocytes Relative: 12
Neutro Abs: 2
Neutrophils Relative %: 46

## 2010-11-07 LAB — LIPASE, BLOOD: Lipase: 17

## 2010-11-20 LAB — COMPREHENSIVE METABOLIC PANEL
ALT: 19
AST: 22
Albumin: 3.7
Alkaline Phosphatase: 53
BUN: 6
CO2: 26
Calcium: 8.8
Chloride: 101
Creatinine, Ser: 0.96
GFR calc Af Amer: >60
GFR calc non Af Amer: >60
Glucose, Bld: 96
Potassium: 3.9
Sodium: 136
Total Bilirubin: 0.6
Total Protein: 6.5

## 2010-11-20 LAB — DIFFERENTIAL
Basophils Absolute: 0
Basophils Relative: 1
Eosinophils Absolute: 0 — ABNORMAL LOW
Eosinophils Relative: 1
Lymphocytes Relative: 50 — ABNORMAL HIGH
Lymphs Abs: 2.4
Monocytes Absolute: 0.4
Monocytes Relative: 9
Neutro Abs: 1.9
Neutrophils Relative %: 40 — ABNORMAL LOW

## 2010-11-20 LAB — CBC
HCT: 42.3
Hemoglobin: 14.3
MCHC: 33.8
MCV: 92.5
Platelets: 193
RBC: 4.57
RDW: 14
WBC: 4.8

## 2010-11-20 LAB — URINALYSIS, ROUTINE W REFLEX MICROSCOPIC
Bilirubin Urine: NEGATIVE
Glucose, UA: NEGATIVE
Hgb urine dipstick: NEGATIVE
Ketones, ur: NEGATIVE
Nitrite: NEGATIVE
Protein, ur: NEGATIVE
Specific Gravity, Urine: 1.004 — ABNORMAL LOW
Urobilinogen, UA: 0.2
pH: 7.5

## 2010-11-20 LAB — LIPASE, BLOOD: Lipase: 14

## 2010-11-21 LAB — COMPREHENSIVE METABOLIC PANEL
ALT: 25
AST: 22
Albumin: 3.4 — ABNORMAL LOW
Alkaline Phosphatase: 50
BUN: 6
CO2: 29
Calcium: 9
Chloride: 104
Creatinine, Ser: 0.92
GFR calc Af Amer: >60
GFR calc non Af Amer: >60
Glucose, Bld: 91
Potassium: 4.3
Sodium: 140
Total Bilirubin: 0.7
Total Protein: 5.9 — ABNORMAL LOW

## 2010-11-21 LAB — DIFFERENTIAL
Basophils Absolute: 0
Basophils Relative: 1
Eosinophils Absolute: 0.1 — ABNORMAL LOW
Eosinophils Relative: 1
Lymphocytes Relative: 48 — ABNORMAL HIGH
Lymphs Abs: 1.8
Monocytes Absolute: 0.4
Monocytes Relative: 10
Neutro Abs: 1.6 — ABNORMAL LOW
Neutrophils Relative %: 41 — ABNORMAL LOW

## 2010-11-21 LAB — RAPID URINE DRUG SCREEN, HOSP PERFORMED
Amphetamines: NOT DETECTED
Barbiturates: NOT DETECTED
Benzodiazepines: NOT DETECTED
Cocaine: NOT DETECTED
Opiates: NOT DETECTED
Tetrahydrocannabinol: NOT DETECTED

## 2010-11-21 LAB — URINALYSIS, ROUTINE W REFLEX MICROSCOPIC
Bilirubin Urine: NEGATIVE
Glucose, UA: NEGATIVE
Hgb urine dipstick: NEGATIVE
Ketones, ur: NEGATIVE
Nitrite: NEGATIVE
Protein, ur: NEGATIVE
Specific Gravity, Urine: 1.009
Urobilinogen, UA: 1
pH: 7

## 2010-11-21 LAB — CBC
HCT: 41
Hemoglobin: 14
MCHC: 34
MCV: 91
Platelets: 211
RBC: 4.51
RDW: 14
WBC: 3.9 — ABNORMAL LOW

## 2010-11-21 LAB — ETHANOL: Alcohol, Ethyl (B): <5

## 2010-11-21 LAB — LIPASE, BLOOD: Lipase: 13

## 2010-11-23 LAB — HEPATIC FUNCTION PANEL
ALT: 77 — ABNORMAL HIGH
AST: 47 — ABNORMAL HIGH
Albumin: 3.8
Alkaline Phosphatase: 51
Bilirubin, Direct: 0.1
Indirect Bilirubin: 0.3
Total Bilirubin: 0.4
Total Protein: 6.8

## 2010-11-23 LAB — I-STAT 8, (EC8 V) (CONVERTED LAB)
Acid-Base Excess: 4 — ABNORMAL HIGH
BUN: 14
Bicarbonate: 32.3 — ABNORMAL HIGH
Chloride: 103
Glucose, Bld: 93
HCT: 49
Hemoglobin: 16.7
Operator id: 272551
Potassium: 4.7
Sodium: 138
TCO2: 34
pCO2, Ven: 58.9 — ABNORMAL HIGH
pH, Ven: 7.348 — ABNORMAL HIGH

## 2010-11-23 LAB — URINALYSIS, ROUTINE W REFLEX MICROSCOPIC
Bilirubin Urine: NEGATIVE
Bilirubin Urine: NEGATIVE
Glucose, UA: NEGATIVE
Glucose, UA: NEGATIVE
Hgb urine dipstick: NEGATIVE
Hgb urine dipstick: NEGATIVE
Ketones, ur: NEGATIVE
Ketones, ur: NEGATIVE
Nitrite: NEGATIVE
Nitrite: NEGATIVE
Protein, ur: NEGATIVE
Protein, ur: NEGATIVE
Specific Gravity, Urine: 1.006
Specific Gravity, Urine: 1.011
Urobilinogen, UA: 0.2
Urobilinogen, UA: 0.2
pH: 7.5
pH: 6

## 2010-11-23 LAB — COMPREHENSIVE METABOLIC PANEL
ALT: 30
AST: 26
Albumin: 3.7
Alkaline Phosphatase: 51
BUN: 9
CO2: 29
Calcium: 8.8
Chloride: 106
Creatinine, Ser: 0.95
GFR calc Af Amer: >60
GFR calc non Af Amer: >60
Glucose, Bld: 91
Potassium: 4
Sodium: 138
Total Bilirubin: 0.7
Total Protein: 6.7

## 2010-11-23 LAB — DIFFERENTIAL
Basophils Absolute: 0
Basophils Absolute: 0
Basophils Relative: 1
Basophils Relative: 1
Eosinophils Absolute: 0
Eosinophils Absolute: 0.1
Eosinophils Relative: 1
Eosinophils Relative: 1
Lymphocytes Relative: 49 — ABNORMAL HIGH
Lymphocytes Relative: 56 — ABNORMAL HIGH
Lymphs Abs: 1.9
Lymphs Abs: 2.7
Monocytes Absolute: 0.5
Monocytes Absolute: 0.4
Monocytes Relative: 12 — ABNORMAL HIGH
Monocytes Relative: 9
Neutro Abs: 1.5 — ABNORMAL LOW
Neutro Abs: 1.6 — ABNORMAL LOW
Neutrophils Relative %: 38 — ABNORMAL LOW
Neutrophils Relative %: 34 — ABNORMAL LOW

## 2010-11-23 LAB — CBC
HCT: 43.7
HCT: 41.7
Hemoglobin: 14.6
Hemoglobin: 14
MCHC: 33.3
MCHC: 33.5
MCV: 92.5
MCV: 92.2
Platelets: 209
Platelets: 203
RBC: 4.72
RBC: 4.52
RDW: 15 — ABNORMAL HIGH
RDW: 14.6 — ABNORMAL HIGH
WBC: 3.9 — ABNORMAL LOW
WBC: 4.8

## 2010-11-23 LAB — LIPASE, BLOOD
Lipase: 17
Lipase: 19

## 2010-11-23 LAB — POCT I-STAT CREATININE
Creatinine, Ser: 1
Operator id: 272551

## 2010-11-24 LAB — CBC
HCT: 44.6
Hemoglobin: 14.9
MCHC: 33.5
MCV: 92
Platelets: 220
RBC: 4.85
RDW: 14.2 — ABNORMAL HIGH
WBC: 3.6 — ABNORMAL LOW

## 2010-11-24 LAB — DIFFERENTIAL
Basophils Absolute: 0
Basophils Relative: 1
Eosinophils Absolute: 0
Eosinophils Relative: 1
Lymphocytes Relative: 41
Lymphs Abs: 1.5
Monocytes Absolute: 0.4
Monocytes Relative: 10
Neutro Abs: 1.7
Neutrophils Relative %: 48

## 2010-11-24 LAB — COMPREHENSIVE METABOLIC PANEL
ALT: 19
AST: 21
Albumin: 4
Alkaline Phosphatase: 43
BUN: 6
CO2: 27
Calcium: 9
Chloride: 108
Creatinine, Ser: 0.88
GFR calc Af Amer: >60
GFR calc non Af Amer: >60
Glucose, Bld: 87
Potassium: 4.3
Sodium: 141
Total Bilirubin: 1.3 — ABNORMAL HIGH
Total Protein: 7

## 2010-11-24 LAB — URINALYSIS, ROUTINE W REFLEX MICROSCOPIC
Bilirubin Urine: NEGATIVE
Glucose, UA: NEGATIVE
Hgb urine dipstick: NEGATIVE
Ketones, ur: NEGATIVE
Nitrite: NEGATIVE
Protein, ur: NEGATIVE
Specific Gravity, Urine: 1.016
Urobilinogen, UA: 1
pH: 7.5

## 2010-11-24 LAB — POCT CARDIAC MARKERS
CKMB, poc: <1 — ABNORMAL LOW
Myoglobin, poc: 64.7
Operator id: 270111
Troponin i, poc: <0.05

## 2010-11-24 LAB — POCT I-STAT CREATININE
Creatinine, Ser: 1.1
Operator id: 270111

## 2010-11-24 LAB — I-STAT 8, (EC8 V) (CONVERTED LAB)
Acid-Base Excess: 2
BUN: 8
Bicarbonate: 27.5 — ABNORMAL HIGH
Chloride: 105
Glucose, Bld: 91
HCT: 47
Hemoglobin: 16
Operator id: 270111
Potassium: 4.1
Sodium: 139
TCO2: 29
pCO2, Ven: 46.1
pH, Ven: 7.383 — ABNORMAL HIGH

## 2010-11-24 LAB — LIPASE, BLOOD: Lipase: 18

## 2010-11-27 LAB — DIFFERENTIAL
Basophils Absolute: 0
Basophils Absolute: 0
Basophils Relative: 1
Basophils Relative: 1
Eosinophils Absolute: 0.1
Eosinophils Absolute: 0.1
Eosinophils Relative: 1
Eosinophils Relative: 3
Lymphocytes Relative: 52 — ABNORMAL HIGH
Lymphocytes Relative: 54 — ABNORMAL HIGH
Lymphs Abs: 2.2
Lymphs Abs: 2.3
Monocytes Absolute: 0.4
Monocytes Absolute: 0.5
Monocytes Relative: 12 — ABNORMAL HIGH
Monocytes Relative: 9
Neutro Abs: 1.5 — ABNORMAL LOW
Neutro Abs: 1.5 — ABNORMAL LOW
Neutrophils Relative %: 33 — ABNORMAL LOW
Neutrophils Relative %: 36 — ABNORMAL LOW

## 2010-11-27 LAB — CBC
HCT: 42.2
HCT: 43.7
Hemoglobin: 14.1
Hemoglobin: 14.7
MCHC: 33.5
MCHC: 33.5
MCV: 91.5
MCV: 92.2
Platelets: 200
Platelets: 209
RBC: 4.61
RBC: 4.75
RDW: 13.1
RDW: 13.3
WBC: 4.1
WBC: 4.4

## 2010-11-27 LAB — COMPREHENSIVE METABOLIC PANEL
ALT: 15
ALT: 18
AST: 18
AST: 20
Albumin: 3.7
Albumin: 3.7
Alkaline Phosphatase: 40
Alkaline Phosphatase: 47
BUN: 4 — ABNORMAL LOW
BUN: 8
CO2: 27
CO2: 28
Calcium: 8.8
Calcium: 8.9
Chloride: 102
Chloride: 103
Creatinine, Ser: 0.98
Creatinine, Ser: 1.1
GFR calc Af Amer: >60
GFR calc Af Amer: >60
GFR calc non Af Amer: >60
GFR calc non Af Amer: >60
Glucose, Bld: 101 — ABNORMAL HIGH
Glucose, Bld: 102 — ABNORMAL HIGH
Potassium: 3 — ABNORMAL LOW
Potassium: 3.8
Sodium: 133 — ABNORMAL LOW
Sodium: 134 — ABNORMAL LOW
Total Bilirubin: 0.5
Total Bilirubin: 1
Total Protein: 6.5
Total Protein: 6.5

## 2010-11-27 LAB — LIPASE, BLOOD
Lipase: 16
Lipase: 16

## 2010-11-27 LAB — URINALYSIS, ROUTINE W REFLEX MICROSCOPIC
Glucose, UA: NEGATIVE
Hgb urine dipstick: NEGATIVE
Ketones, ur: 15 — AB
Nitrite: NEGATIVE
Protein, ur: NEGATIVE
Specific Gravity, Urine: 1.024
Urobilinogen, UA: 0.2
pH: 5.5

## 2010-12-01 ENCOUNTER — Other Ambulatory Visit: Payer: Self-pay | Admitting: Internal Medicine

## 2010-12-05 ENCOUNTER — Ambulatory Visit (HOSPITAL_COMMUNITY)
Admission: RE | Admit: 2010-12-05 | Discharge: 2010-12-05 | Disposition: A | Discharge status: Home / Self Care | Payer: Self-pay | Source: Ambulatory Visit | Attending: Internal Medicine | Admitting: Internal Medicine

## 2010-12-05 DIAGNOSIS — R634 Abnormal weight loss: Secondary | ICD-10-CM | POA: Insufficient documentation

## 2010-12-05 DIAGNOSIS — R109 Unspecified abdominal pain: Secondary | ICD-10-CM | POA: Insufficient documentation

## 2010-12-05 DIAGNOSIS — R112 Nausea with vomiting, unspecified: Secondary | ICD-10-CM | POA: Insufficient documentation

## 2010-12-05 MED ORDER — IOHEXOL 300 MG/ML  SOLN: 80.0000 mL | Freq: Once | INTRAMUSCULAR | Status: AC | PRN

## 2011-05-28 ENCOUNTER — Encounter (HOSPITAL_COMMUNITY): Payer: Self-pay | Admitting: *Deleted

## 2011-05-28 ENCOUNTER — Emergency Department (HOSPITAL_COMMUNITY)
Admission: EM | Admit: 2011-05-28 | Discharge: 2011-05-29 | Disposition: A | Discharge status: Home / Self Care | Payer: Self-pay | Attending: Emergency Medicine | Admitting: Emergency Medicine

## 2011-05-28 DIAGNOSIS — R112 Nausea with vomiting, unspecified: Secondary | ICD-10-CM | POA: Insufficient documentation

## 2011-05-28 DIAGNOSIS — R1084 Generalized abdominal pain: Secondary | ICD-10-CM | POA: Insufficient documentation

## 2011-05-28 NOTE — ED Notes (Signed)
Vomiting since yesterday with diarrhea.  Unable to hold anything down

## 2011-05-29 LAB — DIFFERENTIAL
Basophils Absolute: 0 10*3/uL (ref 0.0–0.1)
Basophils Relative: 0 % (ref 0–1)
Eosinophils Absolute: 0.1 10*3/uL (ref 0.0–0.7)
Eosinophils Relative: 1 % (ref 0–5)
Lymphocytes Relative: 51 % — ABNORMAL HIGH (ref 12–46)
Lymphs Abs: 2.5 10*3/uL (ref 0.7–4.0)
Monocytes Absolute: 0.6 10*3/uL (ref 0.1–1.0)
Monocytes Relative: 13 % — ABNORMAL HIGH (ref 3–12)
Neutro Abs: 1.7 10*3/uL (ref 1.7–7.7)
Neutrophils Relative %: 35 % — ABNORMAL LOW (ref 43–77)

## 2011-05-29 LAB — CBC
HCT: 42.2 % (ref 39.0–52.0)
Hemoglobin: 14.8 g/dL (ref 13.0–17.0)
MCH: 31.3 pg (ref 26.0–34.0)
MCHC: 35.1 g/dL (ref 30.0–36.0)
MCV: 89.2 fL (ref 78.0–100.0)
Platelets: 205 10*3/uL (ref 150–400)
RBC: 4.73 MIL/uL (ref 4.22–5.81)
RDW: 13.7 % (ref 11.5–15.5)
WBC: 4.9 10*3/uL (ref 4.0–10.5)

## 2011-05-29 LAB — HEPATIC FUNCTION PANEL
ALT: 16 U/L (ref 0–53)
AST: 20 U/L (ref 0–37)
Albumin: 3.7 g/dL (ref 3.5–5.2)
Alkaline Phosphatase: 61 U/L (ref 39–117)
Bilirubin, Direct: <0.1 mg/dL (ref 0.0–0.3)
Total Bilirubin: 0.3 mg/dL (ref 0.3–1.2)
Total Protein: 7.2 g/dL (ref 6.0–8.3)

## 2011-05-29 LAB — BASIC METABOLIC PANEL
BUN: 10 mg/dL (ref 6–23)
CO2: 30 mEq/L (ref 19–32)
Calcium: 9.4 mg/dL (ref 8.4–10.5)
Chloride: 99 mEq/L (ref 96–112)
Creatinine, Ser: 1.07 mg/dL (ref 0.50–1.35)
GFR calc Af Amer: >90 mL/min (ref >90)
GFR calc non Af Amer: 84 mL/min — ABNORMAL LOW (ref >90)
Glucose, Bld: 93 mg/dL (ref 70–99)
Potassium: 3.7 mEq/L (ref 3.5–5.1)
Sodium: 137 mEq/L (ref 135–145)

## 2011-05-29 LAB — LIPASE, BLOOD: Lipase: 15 U/L (ref 11–59)

## 2011-05-29 MED ORDER — PROMETHAZINE HCL 25 MG PO TABS: 25.0000 mg | ORAL_TABLET | Freq: Four times a day (QID) | ORAL | Status: DC | PRN

## 2011-05-29 MED ORDER — SODIUM CHLORIDE 0.9 % IV SOLN
1000.0000 mL | INTRAVENOUS | Status: DC
  Administered 2011-05-29: 1000 mL via INTRAVENOUS

## 2011-05-29 MED ORDER — ONDANSETRON HCL 4 MG/2ML IJ SOLN
4.0000 mg | Freq: Once | INTRAMUSCULAR | Status: AC
  Administered 2011-05-29: 4 mg via INTRAVENOUS
  Filled 2011-05-29: qty 2

## 2011-05-29 MED ORDER — PROMETHAZINE HCL 25 MG RE SUPP: 25.0000 mg | Freq: Four times a day (QID) | RECTAL | Status: DC | PRN

## 2011-05-29 MED ORDER — SODIUM CHLORIDE 0.9 % IV SOLN
1000.0000 mL | Freq: Once | INTRAVENOUS | Status: AC
  Administered 2011-05-29: 1000 mL via INTRAVENOUS

## 2011-05-29 MED ORDER — ONDANSETRON 8 MG PO TBDP: 8.0000 mg | ORAL_TABLET | Freq: Three times a day (TID) | ORAL | Status: AC | PRN

## 2011-05-29 MED ORDER — MORPHINE SULFATE 4 MG/ML IJ SOLN
4.0000 mg | Freq: Once | INTRAMUSCULAR | Status: AC
  Administered 2011-05-29: 4 mg via INTRAVENOUS
  Filled 2011-05-29: qty 1

## 2011-05-29 NOTE — ED Notes (Signed)
Rx x 3, pt voiced understanding to f/u with PCP and return for worsening sx despite medication

## 2011-05-29 NOTE — ED Provider Notes (Signed)
History     CSN: [id number]  Arrival date & time 05/28/11  2235   First MD Initiated Contact with Patient 05/29/11 0154      Chief Complaint  Patient presents with  . Emesis    (Consider location/radiation/quality/duration/timing/severity/associated sxs/prior treatment) HPI 41 year old male presents to emergency room with complaint of upper.pain along with nausea vomiting and diarrhea. Patient reports symptoms started 2 days ago. He has been unable to keep down any of his medications secondary to vomiting. Patient ports past history of pancreatitis and has chronic pain associated with this. No fevers no chills. No blood in emesis or or stool. Patient denies any alcohol intake. He has had no sick contacts no travel no unusual foods. He reports his last pancreatitis flare was about a year ago. Past Medical History  Diagnosis Date  . Asthma     History reviewed. No pertinent past surgical history.  No family history on file.  History  Substance Use Topics  . Smoking status: Current Everyday Smoker  . Smokeless tobacco: Not on file  . Alcohol Use: No      Review of Systems  All other systems reviewed and are negative.    Allergies  Aspirin and Ibuprofen  Home Medications   Current Outpatient Rx  Name Route Sig Dispense Refill  . MORPHINE SULFATE ER 15 MG PO TB12 Oral Take 15 mg by mouth 2 (two) times daily.    . MORPHINE SULFATE ER 30 MG PO TB12 Oral Take 30 mg by mouth 2 (two) times daily.    [name] OMEPRAZOLE 20 MG PO CPDR Oral Take 20 mg by mouth daily.    . TRAZODONE HCL 50 MG PO TABS Oral Take 200 mg by mouth at bedtime.    [name] ONDANSETRON 8 MG PO TBDP Oral Take 1 tablet (8 mg total) by mouth every 8 (eight) hours as needed for nausea. 20 tablet 0  . PROMETHAZINE HCL 25 MG RE SUPP Rectal Place 1 suppository (25 mg total) rectally every 6 (six) hours as needed for nausea. 12 each 0  . PROMETHAZINE HCL 25 MG PO TABS Oral Take 1 tablet (25 mg total) by mouth every 6 (six)  hours as needed for nausea. 30 tablet 0    BP 103/59  Pulse 69  Temp(Src) 97.5 F (36.4 C) (Oral)  Resp 18  SpO2 98%  Physical Exam  Nursing note and vitals reviewed. Constitutional: He is oriented to person, place, and time. He appears well-developed and well-nourished.  HENT:  Head: Normocephalic and atraumatic.  Nose: Nose normal.  Mouth/Throat: Oropharynx is clear and moist.  Eyes: Conjunctivae and EOM are normal. Pupils are equal, round, and reactive to light.  Neck: Normal range of motion. Neck supple. No JVD present. No tracheal deviation present. No thyromegaly present.  Cardiovascular: Normal rate, regular rhythm, normal heart sounds and intact distal pulses.  Exam reveals no gallop and no friction rub.   No murmur heard. Pulmonary/Chest: Effort normal and breath sounds normal. No stridor. No respiratory distress. He has no wheezes. He has no rales. He exhibits no tenderness.  Abdominal: Soft. Bowel sounds are normal. He exhibits no distension and no mass. There is tenderness (mild tenderness in epigastrium). There is no rebound and no guarding.  Musculoskeletal: Normal range of motion. He exhibits no edema and no tenderness.  Lymphadenopathy:    He has no cervical adenopathy.  Neurological: He is oriented to person, place, and time. He exhibits normal muscle tone. Coordination normal.  Skin: Skin  is dry. No rash noted. No erythema. No pallor.  Psychiatric: He has a normal mood and affect. His behavior is normal. Judgment and thought content normal.    ED Course  Procedures (including critical care time)  Labs Reviewed  DIFFERENTIAL - Abnormal; Notable for the following:    Neutrophils Relative 35 (*)    Lymphocytes Relative 51 (*)    Monocytes Relative 13 (*)    All other components within normal limits  BASIC METABOLIC PANEL - Abnormal; Notable for the following:    GFR calc non Af Amer 84 (*)    All other components within normal limits  CBC  LIPASE, BLOOD    HEPATIC FUNCTION PANEL   No results found.   1. Abdominal pain, generalized   2. Nausea vomiting and diarrhea       MDM  41 year old male with nausea vomiting diarrhea and abdominal pain, past history of pancreatitis. Labs are reassuring. Patient feels better after fluid bolus pain medicine nausea medicine. Will discharge home with nausea medicine and have him continue taking his MS Contin at home        Olivia Mackie, MD 05/29/11 [id number]

## 2011-05-29 NOTE — ED Notes (Signed)
Pt c/o n/v since Saturday.  States he is unable to keep any food down, pain 10/10 in abdomen that spreads to back.  Feels like pins and needles in back.

## 2011-05-29 NOTE — Discharge Instructions (Signed)
Take medications as prescribed. Return to emergency room for fever, worsening abdominal pain, persistent nausea and vomiting despite medication or other concerning symptoms. Please followup with your primary care Dr. for recheck in 3-5 days  Abdominal Pain (Nonspecific) Your exam might not show the exact reason you have abdominal pain. Since there are many different causes of abdominal pain, another checkup and more tests may be needed. It is very important to follow up for lasting (persistent) or worsening symptoms. A possible cause of abdominal pain in any person who still has his or her appendix is acute appendicitis. Appendicitis is often hard to diagnose. Normal blood tests, urine tests, ultrasound, and CT scans do not completely rule out early appendicitis or other causes of abdominal pain. Sometimes, only the changes that happen over time will allow appendicitis and other causes of abdominal pain to be determined. Other potential problems that may require surgery may also take time to become more apparent. Because of this, it is important that you follow all of the instructions below. HOME CARE INSTRUCTIONS   Rest as much as possible.   Do not eat solid food until your pain is gone.   While adults or children have pain: A diet of water, weak decaffeinated tea, broth or bouillon, gelatin, oral rehydration solutions (ORS), frozen ice pops, or ice chips may be helpful.   When pain is gone in adults or children: Start a light diet (dry toast, crackers, applesauce, or white rice). Increase the diet slowly as long as it does not bother you. Eat no dairy products (including cheese and eggs) and no spicy, fatty, fried, or high-fiber foods.   Use no alcohol, caffeine, or cigarettes.   Take your regular medicines unless your caregiver told you not to.   Take any prescribed medicine as directed.   Only take over-the-counter or prescription medicines for pain, discomfort, or fever as directed by your  caregiver. Do not give aspirin to children.  If your caregiver has given you a follow-up appointment, it is very important to keep that appointment. Not keeping the appointment could result in a permanent injury and/or lasting (chronic) pain and/or disability. If there is any problem keeping the appointment, you must call to reschedule.  SEEK IMMEDIATE MEDICAL CARE IF:   Your pain is not gone in 24 hours.   Your pain becomes worse, changes location, or feels different.   You or your child has an oral temperature above 102 F (38.9 C), not controlled by medicine.   Your baby is older than 3 months with a rectal temperature of 102 F (38.9 C) or higher.   Your baby is 23 months old or younger with a rectal temperature of 100.4 F (38 C) or higher.   You have shaking chills.   You keep throwing up (vomiting) or cannot drink liquids.   There is blood in your vomit or you see blood in your bowel movements.   Your bowel movements become dark or black.   You have frequent bowel movements.   Your bowel movements stop (become blocked) or you cannot pass gas.   You have bloody, frequent, or painful urination.   You have yellow discoloration in the skin or whites of the eyes.   Your stomach becomes bloated or bigger.   You have dizziness or fainting.   You have chest or back pain.  MAKE SURE YOU:   Understand these instructions.   Will watch your condition.   Will get help right away if you  are not doing well or get worse.  Document Released: 01/29/2005 Document Revised: 01/18/2011 Document Reviewed: 12/27/2008 [facility] Patient Information 2012 [place], [place].  B.R.A.T. Diet Your doctor has recommended the B.R.A.T. diet for you or your child until the condition improves. This is often used to help control diarrhea and vomiting symptoms. If you or your child can tolerate clear liquids, you may have:  Bananas.   Rice.   Applesauce.   Toast (and other simple starches such  as crackers, potatoes, noodles).  Be sure to avoid dairy products, meats, and fatty foods until symptoms are better. Fruit juices such as apple, grape, and prune juice can make diarrhea worse. Avoid these. Continue this diet for 2 days or as instructed by your caregiver. Document Released: 01/29/2005 Document Revised: 01/18/2011 Document Reviewed: 07/18/2006 [facility] Patient Information 2012 [place], [place].  Nausea and Vomiting Nausea is a sick feeling that often comes before throwing up (vomiting). Vomiting is a reflex where stomach contents come out of your mouth. Vomiting can cause severe loss of body fluids (dehydration). Children and elderly adults can become dehydrated quickly, especially if they also have diarrhea. Nausea and vomiting are symptoms of a condition or disease. It is important to find the cause of your symptoms. CAUSES   Direct irritation of the stomach lining. This irritation can result from increased acid production (gastroesophageal reflux disease), infection, food poisoning, taking certain medicines (such as nonsteroidal anti-inflammatory drugs), alcohol use, or tobacco use.   Signals from the brain.These signals could be caused by a headache, heat exposure, an inner ear disturbance, increased pressure in the brain from injury, infection, a tumor, or a concussion, pain, emotional stimulus, or metabolic problems.   An obstruction in the gastrointestinal tract (bowel obstruction).   Illnesses such as diabetes, hepatitis, gallbladder problems, appendicitis, kidney problems, cancer, sepsis, atypical symptoms of a heart attack, or eating disorders.   Medical treatments such as chemotherapy and radiation.   Receiving medicine that makes you sleep (general anesthetic) during surgery.  DIAGNOSIS Your caregiver may ask for tests to be done if the problems do not improve after a few days. Tests may also be done if symptoms are severe or if the reason for the nausea and vomiting  is not clear. Tests may include:  Urine tests.   Blood tests.   Stool tests.   Cultures (to look for evidence of infection).   X-rays or other imaging studies.  Test results can help your caregiver make decisions about treatment or the need for additional tests. TREATMENT You need to stay well hydrated. Drink frequently but in small amounts.You may wish to drink water, sports drinks, clear broth, or eat frozen ice pops or gelatin dessert to help stay hydrated.When you eat, eating slowly may help prevent nausea.There are also some antinausea medicines that may help prevent nausea. HOME CARE INSTRUCTIONS   Take all medicine as directed by your caregiver.   If you do not have an appetite, do not force yourself to eat. However, you must continue to drink fluids.   If you have an appetite, eat a normal diet unless your caregiver tells you differently.   Eat a variety of complex carbohydrates (rice, wheat, potatoes, bread), lean meats, yogurt, fruits, and vegetables.   Avoid high-fat foods because they are more difficult to digest.   Drink enough water and fluids to keep your urine clear or pale yellow.   If you are dehydrated, ask your caregiver for specific rehydration instructions. Signs of dehydration may include:  Severe thirst.   Dry lips and mouth.   Dizziness.   Dark urine.   Decreasing urine frequency and amount.   Confusion.   Rapid breathing or pulse.  SEEK IMMEDIATE MEDICAL CARE IF:   You have blood or brown flecks (like coffee grounds) in your vomit.   You have black or bloody stools.   You have a severe headache or stiff neck.   You are confused.   You have severe abdominal pain.   You have chest pain or trouble breathing.   You do not urinate at least once every 8 hours.   You develop cold or clammy skin.   You continue to vomit for longer than 24 to 48 hours.   You have a fever.  MAKE SURE YOU:   Understand these instructions.   Will  watch your condition.   Will get help right away if you are not doing well or get worse.  Document Released: 01/29/2005 Document Revised: 01/18/2011 Document Reviewed: 06/28/2010 [facility] Patient Information 2012 [place], [place].

## 2011-07-28 ENCOUNTER — Encounter (HOSPITAL_COMMUNITY): Payer: Self-pay | Admitting: Emergency Medicine

## 2011-07-28 ENCOUNTER — Emergency Department (HOSPITAL_COMMUNITY)
Admission: EM | Admit: 2011-07-28 | Discharge: 2011-07-28 | Disposition: A | Discharge status: Home / Self Care | Payer: Self-pay | Attending: Emergency Medicine | Admitting: Emergency Medicine

## 2011-07-28 DIAGNOSIS — K047 Periapical abscess without sinus: Secondary | ICD-10-CM | POA: Insufficient documentation

## 2011-07-28 DIAGNOSIS — Z886 Allergy status to analgesic agent status: Secondary | ICD-10-CM | POA: Insufficient documentation

## 2011-07-28 DIAGNOSIS — J45909 Unspecified asthma, uncomplicated: Secondary | ICD-10-CM | POA: Insufficient documentation

## 2011-07-28 DIAGNOSIS — F17200 Nicotine dependence, unspecified, uncomplicated: Secondary | ICD-10-CM | POA: Insufficient documentation

## 2011-07-28 DIAGNOSIS — K029 Dental caries, unspecified: Secondary | ICD-10-CM | POA: Insufficient documentation

## 2011-07-28 HISTORY — DX: Acute pancreatitis without necrosis or infection, unspecified: K85.90

## 2011-07-28 MED ORDER — OXYCODONE-ACETAMINOPHEN 5-325 MG PO TABS: 1.0000 | ORAL_TABLET | Freq: Four times a day (QID) | ORAL | Status: AC | PRN

## 2011-07-28 MED ORDER — OXYCODONE-ACETAMINOPHEN 5-325 MG PO TABS
1.0000 | ORAL_TABLET | Freq: Once | ORAL | Status: AC
  Administered 2011-07-28: 1 via ORAL
  Filled 2011-07-28: qty 1

## 2011-07-28 MED ORDER — PENICILLIN V POTASSIUM 250 MG PO TABS: 250.0000 mg | ORAL_TABLET | Freq: Four times a day (QID) | ORAL | Status: AC

## 2011-07-28 NOTE — ED Provider Notes (Signed)
History   This chart was scribed for Gwyneth Sprout, MD by Shari Heritage. The patient was seen in room STRE5/STRE5. Patient's care was started at 1521.     CSN: [id number]  Arrival date & time 07/28/11  1521   First MD Initiated Contact with Patient 07/28/11 1546      Chief Complaint  Patient presents with  . Facial Swelling  . Dental Pain    (Consider location/radiation/quality/duration/timing/severity/associated sxs/prior treatment) Patient is a 41 y.o. male presenting with tooth pain. The history is provided by the patient. No language interpreter was used.  Dental PainThe primary symptoms include mouth pain. The symptoms began 6 to 12 hours ago. The symptoms are unchanged. The symptoms occur constantly.  Additional symptoms include: facial swelling.   [name] is a 41 y.o. male who presents to the Emergency Department complaining of moderate to severe pain in right lower tooth with associated facial swelling onset this morning. Patient says he woke up this morning and his face was swollen. Patient denies fevers, difficulty breathing or swallowing. Patient doesn't have a dentist. Patient is allergic to Aspirin and Ibuprofen. Patient with h/o of asthma and pancreatitis. Patient is a current everyday smoker.  Past Medical History  Diagnosis Date  . Asthma   . Pancreatitis     History reviewed. No pertinent past surgical history.  History reviewed. No pertinent family history.  History  Substance Use Topics  . Smoking status: Current Everyday Smoker  . Smokeless tobacco: Not on file  . Alcohol Use: No      Review of Systems  HENT: Positive for facial swelling.    A complete 10 system review of systems was obtained and all systems are negative except as noted in the HPI and PMH.   Allergies  Aspirin and Ibuprofen  Home Medications   Current Outpatient Rx  Name Route Sig Dispense Refill  . MORPHINE SULFATE ER 15 MG PO TB12 Oral Take 15 mg by mouth 2 (two)  times daily.    . MORPHINE SULFATE ER 30 MG PO TB12 Oral Take 30 mg by mouth 2 (two) times daily.    [name] OMEPRAZOLE 20 MG PO CPDR Oral Take 20 mg by mouth daily.    [name] PROMETHAZINE HCL 25 MG PO TABS Oral Take 25 mg by mouth every 6 (six) hours as needed. For nausea    . TRAZODONE HCL 50 MG PO TABS Oral Take 200 mg by mouth at bedtime.    [name] PROMETHAZINE HCL 25 MG PO TABS Oral Take 1 tablet (25 mg total) by mouth every 6 (six) hours as needed for nausea. 30 tablet 0    BP 117/64  Pulse 82  Temp 98.7 F (37.1 C) (Oral)  Resp 17  SpO2 99%  Physical Exam  Nursing note and vitals reviewed. Constitutional: He is oriented to person, place, and time. He appears well-developed and well-nourished. No distress.  HENT:  Head: Normocephalic and atraumatic.  Mouth/Throat: Dental caries (mulitple) present.         Right mandible swelling. Multiple dental caries with pain and swelling.  Eyes: Conjunctivae and EOM are normal.  Neck: Neck supple.  Cardiovascular: Normal rate.   Pulmonary/Chest: Effort normal and breath sounds normal.  Musculoskeletal: Normal range of motion.  Lymphadenopathy:    He has no cervical adenopathy.  Neurological: He is alert and oriented to person, place, and time. No sensory deficit.  Skin: Skin is dry.  Psychiatric: He has a normal mood and affect. His behavior is  normal.    ED Course  Procedures (including critical care time) DIAGNOSTIC STUDIES: Oxygen Saturation is 99% on room air, normal by my interpretation.    COORDINATION OF CARE: 3:53PM - Patient informed of current plan for treatment and evaluation and agrees with plan at this time. Will refer patient to a dentist. Will prescribe antibiotic and pain medication.   Labs Reviewed - No data to display No results found.   1. Dental abscess       MDM   Pt with dental caries and facial swelling.  No signs of ludwig's angina or difficulty swallowing and no systemic symptoms. Will treat with PCN and  have pt f/u with dentist.       I personally performed the services described in this documentation, which was scribed in my presence.  The recorded information has been reviewed and considered.    Gwyneth Sprout, MD 07/28/11 1601

## 2011-07-28 NOTE — Discharge Instructions (Signed)
Dental Abscess A dental abscess usually starts from an infected tooth. Antibiotic medicine and pain pills can be helpful, but dental infections require the attention of a dentist. Rinse around the infected area often with salt water (a pinch of salt in 8 oz of warm water). Do not apply heat to the outside of your face. See your dentist or oral surgeon as soon as possible.  SEEK IMMEDIATE MEDICAL CARE IF:  You have increasing, severe pain that is not relieved by medicine.   You or your child has an oral temperature above 102 F (38.9 C), not controlled by medicine.   Your baby is older than 3 months with a rectal temperature of 102 F (38.9 C) or higher.   Your baby is 3 months old or younger with a rectal temperature of 100.4 F (38 C) or higher.   You develop chills, severe headache, difficulty breathing, or trouble swallowing.   You have swelling in the neck or around the eye.  Document Released: 01/29/2005 Document Revised: 01/18/2011 Document Reviewed: 07/10/2006 [employer] Patient Information 2012 [employer]. 

## 2011-07-28 NOTE — ED Notes (Signed)
Pt presents to department for evaluation of R lower molar pain. Ongoing x1 day. Pt reports pain and swelling to R side of face. Does not have dentist, states no relief with medications at home. 9/10 pain at the time. No drainage noted at site. Respirations unlabored.

## 2011-07-28 NOTE — ED Notes (Signed)
Pt discharged home, ride in the waiting room. Had no further questions at the time.  

## 2011-07-28 NOTE — ED Notes (Signed)
Pt reports having bad tooth ache in R lower tooth; pt reports that this AM he woke up and face was swollen; pt does report fevers, no dentist; pt does have swelling to R lower jaw; denies diff breathing or swallowing

## 2011-08-26 ENCOUNTER — Encounter (HOSPITAL_COMMUNITY): Payer: Self-pay

## 2011-08-26 ENCOUNTER — Emergency Department (HOSPITAL_COMMUNITY)
Admission: EM | Admit: 2011-08-26 | Discharge: 2011-08-26 | Disposition: A | Discharge status: Home / Self Care | Payer: Self-pay | Attending: Emergency Medicine | Admitting: Emergency Medicine

## 2011-08-26 DIAGNOSIS — R221 Localized swelling, mass and lump, neck: Secondary | ICD-10-CM | POA: Insufficient documentation

## 2011-08-26 DIAGNOSIS — R112 Nausea with vomiting, unspecified: Secondary | ICD-10-CM | POA: Insufficient documentation

## 2011-08-26 DIAGNOSIS — K089 Disorder of teeth and supporting structures, unspecified: Secondary | ICD-10-CM | POA: Insufficient documentation

## 2011-08-26 DIAGNOSIS — R220 Localized swelling, mass and lump, head: Secondary | ICD-10-CM | POA: Insufficient documentation

## 2011-08-26 DIAGNOSIS — K0889 Other specified disorders of teeth and supporting structures: Secondary | ICD-10-CM

## 2011-08-26 DIAGNOSIS — R109 Unspecified abdominal pain: Secondary | ICD-10-CM | POA: Insufficient documentation

## 2011-08-26 LAB — COMPREHENSIVE METABOLIC PANEL
ALT: 13 U/L (ref 0–53)
AST: 17 U/L (ref 0–37)
Albumin: 3.3 g/dL — ABNORMAL LOW (ref 3.5–5.2)
Alkaline Phosphatase: 54 U/L (ref 39–117)
BUN: 6 mg/dL (ref 6–23)
CO2: 26 mEq/L (ref 19–32)
Calcium: 8.9 mg/dL (ref 8.4–10.5)
Chloride: 102 mEq/L (ref 96–112)
Creatinine, Ser: 1.04 mg/dL (ref 0.50–1.35)
GFR calc Af Amer: >90 mL/min (ref >90)
GFR calc non Af Amer: 86 mL/min — ABNORMAL LOW (ref >90)
Glucose, Bld: 95 mg/dL (ref 70–99)
Potassium: 3.8 mEq/L (ref 3.5–5.1)
Sodium: 136 mEq/L (ref 135–145)
Total Bilirubin: 0.2 mg/dL — ABNORMAL LOW (ref 0.3–1.2)
Total Protein: 6.7 g/dL (ref 6.0–8.3)

## 2011-08-26 LAB — CBC WITH DIFFERENTIAL/PLATELET
Basophils Absolute: 0 10*3/uL (ref 0.0–0.1)
Basophils Relative: 0 % (ref 0–1)
Eosinophils Absolute: 0 10*3/uL (ref 0.0–0.7)
Eosinophils Relative: 1 % (ref 0–5)
HCT: 38.3 % — ABNORMAL LOW (ref 39.0–52.0)
Hemoglobin: 13.2 g/dL (ref 13.0–17.0)
Lymphocytes Relative: 55 % — ABNORMAL HIGH (ref 12–46)
Lymphs Abs: 2.6 10*3/uL (ref 0.7–4.0)
MCH: 31 pg (ref 26.0–34.0)
MCHC: 34.5 g/dL (ref 30.0–36.0)
MCV: 89.9 fL (ref 78.0–100.0)
Monocytes Absolute: 0.3 10*3/uL (ref 0.1–1.0)
Monocytes Relative: 7 % (ref 3–12)
Neutro Abs: 1.8 10*3/uL (ref 1.7–7.7)
Neutrophils Relative %: 38 % — ABNORMAL LOW (ref 43–77)
Platelets: 182 10*3/uL (ref 150–400)
RBC: 4.26 MIL/uL (ref 4.22–5.81)
RDW: 13.9 % (ref 11.5–15.5)
WBC: 4.7 10*3/uL (ref 4.0–10.5)

## 2011-08-26 LAB — URINALYSIS, ROUTINE W REFLEX MICROSCOPIC
Bilirubin Urine: NEGATIVE
Glucose, UA: NEGATIVE mg/dL
Hgb urine dipstick: NEGATIVE
Ketones, ur: NEGATIVE mg/dL
Leukocytes, UA: NEGATIVE
Nitrite: NEGATIVE
Protein, ur: NEGATIVE mg/dL
Specific Gravity, Urine: 1.015 (ref 1.005–1.030)
Urobilinogen, UA: 1 mg/dL (ref 0.0–1.0)
pH: 7 (ref 5.0–8.0)

## 2011-08-26 LAB — LIPASE, BLOOD: Lipase: 11 U/L (ref 11–59)

## 2011-08-26 MED ORDER — ONDANSETRON HCL 4 MG/2ML IJ SOLN
4.0000 mg | Freq: Once | INTRAMUSCULAR | Status: AC
  Administered 2011-08-26: 4 mg via INTRAVENOUS
  Filled 2011-08-26: qty 2

## 2011-08-26 MED ORDER — SODIUM CHLORIDE 0.9 % IV BOLUS (SEPSIS)
1000.0000 mL | Freq: Once | INTRAVENOUS | Status: AC
  Administered 2011-08-26: 1000 mL via INTRAVENOUS

## 2011-08-26 MED ORDER — ONDANSETRON HCL 4 MG PO TABS: 4.0000 mg | ORAL_TABLET | Freq: Four times a day (QID) | ORAL | Status: AC

## 2011-08-26 MED ORDER — OXYCODONE-ACETAMINOPHEN 5-325 MG PO TABS: 1.0000 | ORAL_TABLET | Freq: Four times a day (QID) | ORAL | Status: AC | PRN

## 2011-08-26 MED ORDER — HYDROMORPHONE HCL PF 1 MG/ML IJ SOLN
1.0000 mg | Freq: Once | INTRAMUSCULAR | Status: AC
  Administered 2011-08-26: 1 mg via INTRAVENOUS
  Filled 2011-08-26: qty 1

## 2011-08-26 NOTE — ED Notes (Signed)
Complains of dental abscess. sts may be in his blood stream now.

## 2011-08-26 NOTE — ED Provider Notes (Signed)
History  Scribed for Robert Chick, MD, the patient was seen in room TR07C/TR07C. This chart was scribed by Candelaria Stagers. The patient's care started at 5:40 PM   CSN: [id number]  Arrival date & time 08/26/11  1553   First MD Initiated Contact with Patient 08/26/11 1734      Chief Complaint  Patient presents with  . Dental Pain     The history is provided by the patient.   [name] is a 41 y.o. male who presents to the Emergency Department complaining of dental abscess on the right side that started two days ago and has gotten worse today.  Pt was seen two days ago and was given antibiotics.  He is also experiencing abdominal pain and vomiting.  Pt has h/o pancreatitis and states that this abdominal pain is similar.  There are no other associated systemic symptoms, there are no other alleviating or modifying factors.     Past Medical History  Diagnosis Date  . Asthma   . Pancreatitis     No past surgical history on file.  No family history on file.  History  Substance Use Topics  . Smoking status: Current Everyday Smoker  . Smokeless tobacco: Not on file  . Alcohol Use: No      Review of Systems  HENT: Positive for dental problem.   Gastrointestinal: Positive for nausea, vomiting and abdominal pain.  All other systems reviewed and are negative.    Allergies  Aspirin and Ibuprofen  Home Medications   Current Outpatient Rx  Name Route Sig Dispense Refill  . MORPHINE SULFATE ER 15 MG PO TB12 Oral Take 15 mg by mouth 2 (two) times daily.    . MORPHINE SULFATE ER 30 MG PO TB12 Oral Take 30 mg by mouth 2 (two) times daily.    [name] OMEPRAZOLE 20 MG PO CPDR Oral Take 20 mg by mouth daily.    [name] PROMETHAZINE HCL 25 MG PO TABS Oral Take 25 mg by mouth every 6 (six) hours as needed. For nausea    . SULFAMETHOXAZOLE-TMP DS 800-160 MG PO TABS Oral Take 1 tablet by mouth 2 (two) times daily.    . TRAZODONE HCL 50 MG PO TABS Oral Take 200 mg by mouth at bedtime.    [name]  ONDANSETRON HCL 4 MG PO TABS Oral Take 1 tablet (4 mg total) by mouth every 6 (six) hours. 12 tablet 0  . OXYCODONE-ACETAMINOPHEN 5-325 MG PO TABS Oral Take 1-2 tablets by mouth every 6 (six) hours as needed for pain. 15 tablet 0  . PROMETHAZINE HCL 25 MG PO TABS Oral Take 1 tablet (25 mg total) by mouth every 6 (six) hours as needed for nausea. 30 tablet 0    BP 114/65  Pulse 68  Temp 98.1 F (36.7 C) (Oral)  Resp 17  SpO2 99%  Physical Exam  Nursing note and vitals reviewed. Constitutional: He is oriented to person, place, and time. He appears well-developed and well-nourished. No distress.  HENT:  Head: Normocephalic and atraumatic.  Mouth/Throat: Oropharynx is clear and moist.       Right lower posterior molar eroded.  Mild swelling of right face.  No definite abscess of gingiva.    Eyes: Conjunctivae are normal.  Abdominal: There is tenderness (mild diffuse tenderness).  Musculoskeletal: Normal range of motion. He exhibits no tenderness.  Neurological: He is alert and oriented to person, place, and time.  Skin: Skin is warm and dry. He is not diaphoretic.  Psychiatric: He has a normal mood and affect. His behavior is normal.  Note- no gaurding or rebound tenderness of abdomen  ED Course  Procedures   DIAGNOSTIC STUDIES: Oxygen Saturation is 99% on room air, normal by my interpretation.    COORDINATION OF CARE:  17:06 Ordered: Lipase, blood; Urinalysis, Routine w reflex microscopic; CBC with Differential; Comprehensive metabolic panel    Labs Reviewed  COMPREHENSIVE METABOLIC PANEL - Abnormal; Notable for the following:    Albumin 3.3 (*)     Total Bilirubin 0.2 (*)     GFR calc non Af Amer 86 (*)     All other components within normal limits  CBC WITH DIFFERENTIAL - Abnormal; Notable for the following:    HCT 38.3 (*)     Neutrophils Relative 38 (*)     Lymphocytes Relative 55 (*)     All other components within normal limits  LIPASE, BLOOD  URINALYSIS, ROUTINE  W REFLEX MICROSCOPIC  LAB REPORT - SCANNED   No results found.   1. Pain, dental   2. Nausea and vomiting       MDM  Pt presenting with c/o dental pain- he is taking antibiotics for this and the swelling and pain have improved over the past 2 days.  Abdominal pain feels c/w his prior pancreatitis.  Also nausea/vomiting.  Labs reassuring.  Pt feels improved after IV meds and fluids.  He has had no further vomiting in the ED and is requesting discharge.  He was discharged before his urine was resulted, but he requested to leave before this.  Discharged with strict return precautions.  Pt agreeable with plan.  I personally performed the services described in this documentation, which was scribed in my presence. The recorded information has been reviewed and considered.         Robert Chick, MD 08/28/11 [phone]

## 2011-11-07 ENCOUNTER — Encounter: Payer: Self-pay | Admitting: Gastroenterology

## 2011-12-29 ENCOUNTER — Emergency Department (HOSPITAL_COMMUNITY): Payer: Medicaid Other

## 2011-12-29 ENCOUNTER — Encounter (HOSPITAL_COMMUNITY): Payer: Self-pay | Admitting: Physical Medicine and Rehabilitation

## 2011-12-29 ENCOUNTER — Emergency Department (HOSPITAL_COMMUNITY)
Admission: EM | Admit: 2011-12-29 | Discharge: 2011-12-29 | Disposition: A | Discharge status: Home / Self Care | Payer: Medicaid Other | Attending: Emergency Medicine | Admitting: Emergency Medicine

## 2011-12-29 DIAGNOSIS — J45909 Unspecified asthma, uncomplicated: Secondary | ICD-10-CM | POA: Insufficient documentation

## 2011-12-29 DIAGNOSIS — R112 Nausea with vomiting, unspecified: Secondary | ICD-10-CM | POA: Insufficient documentation

## 2011-12-29 DIAGNOSIS — F17200 Nicotine dependence, unspecified, uncomplicated: Secondary | ICD-10-CM | POA: Insufficient documentation

## 2011-12-29 DIAGNOSIS — R197 Diarrhea, unspecified: Secondary | ICD-10-CM | POA: Insufficient documentation

## 2011-12-29 DIAGNOSIS — Z8719 Personal history of other diseases of the digestive system: Secondary | ICD-10-CM | POA: Insufficient documentation

## 2011-12-29 DIAGNOSIS — M549 Dorsalgia, unspecified: Secondary | ICD-10-CM | POA: Insufficient documentation

## 2011-12-29 DIAGNOSIS — R109 Unspecified abdominal pain: Secondary | ICD-10-CM

## 2011-12-29 DIAGNOSIS — Z79899 Other long term (current) drug therapy: Secondary | ICD-10-CM | POA: Insufficient documentation

## 2011-12-29 LAB — URINALYSIS, ROUTINE W REFLEX MICROSCOPIC
Glucose, UA: NEGATIVE mg/dL
Hgb urine dipstick: NEGATIVE
Ketones, ur: NEGATIVE mg/dL
Leukocytes, UA: NEGATIVE
Nitrite: NEGATIVE
Protein, ur: NEGATIVE mg/dL
Specific Gravity, Urine: 1.031 — ABNORMAL HIGH (ref 1.005–1.030)
Urobilinogen, UA: 1 mg/dL (ref 0.0–1.0)
pH: 6 (ref 5.0–8.0)

## 2011-12-29 LAB — COMPREHENSIVE METABOLIC PANEL
ALT: 14 U/L (ref 0–53)
AST: 19 U/L (ref 0–37)
Albumin: 3.7 g/dL (ref 3.5–5.2)
Alkaline Phosphatase: 51 U/L (ref 39–117)
BUN: 6 mg/dL (ref 6–23)
CO2: 24 mEq/L (ref 19–32)
Calcium: 8.9 mg/dL (ref 8.4–10.5)
Chloride: 98 mEq/L (ref 96–112)
Creatinine, Ser: 0.81 mg/dL (ref 0.50–1.35)
GFR calc Af Amer: >90 mL/min (ref >90)
GFR calc non Af Amer: >90 mL/min (ref >90)
Glucose, Bld: 99 mg/dL (ref 70–99)
Potassium: 3.6 mEq/L (ref 3.5–5.1)
Sodium: 131 mEq/L — ABNORMAL LOW (ref 135–145)
Total Bilirubin: 0.4 mg/dL (ref 0.3–1.2)
Total Protein: 6.9 g/dL (ref 6.0–8.3)

## 2011-12-29 LAB — CBC WITH DIFFERENTIAL/PLATELET
Basophils Absolute: 0 10*3/uL (ref 0.0–0.1)
Basophils Relative: 0 % (ref 0–1)
Eosinophils Absolute: 0 10*3/uL (ref 0.0–0.7)
Eosinophils Relative: 1 % (ref 0–5)
HCT: 39.8 % (ref 39.0–52.0)
Hemoglobin: 14.3 g/dL (ref 13.0–17.0)
Lymphocytes Relative: 57 % — ABNORMAL HIGH (ref 12–46)
Lymphs Abs: 2.5 10*3/uL (ref 0.7–4.0)
MCH: 31.8 pg (ref 26.0–34.0)
MCHC: 35.9 g/dL (ref 30.0–36.0)
MCV: 88.4 fL (ref 78.0–100.0)
Monocytes Absolute: 0.4 10*3/uL (ref 0.1–1.0)
Monocytes Relative: 10 % (ref 3–12)
Neutro Abs: 1.4 10*3/uL — ABNORMAL LOW (ref 1.7–7.7)
Neutrophils Relative %: 32 % — ABNORMAL LOW (ref 43–77)
Platelets: 206 10*3/uL (ref 150–400)
RBC: 4.5 MIL/uL (ref 4.22–5.81)
RDW: 13.7 % (ref 11.5–15.5)
WBC: 4.4 10*3/uL (ref 4.0–10.5)

## 2011-12-29 LAB — LIPASE, BLOOD: Lipase: 19 U/L (ref 11–59)

## 2011-12-29 MED ORDER — HYDROMORPHONE HCL PF 1 MG/ML IJ SOLN
1.0000 mg | Freq: Once | INTRAMUSCULAR | Status: AC
  Administered 2011-12-29: 1 mg via INTRAVENOUS
  Filled 2011-12-29: qty 1

## 2011-12-29 MED ORDER — SODIUM CHLORIDE 0.9 % IV BOLUS (SEPSIS)
1000.0000 mL | Freq: Once | INTRAVENOUS | Status: AC
  Administered 2011-12-29: 1000 mL via INTRAVENOUS

## 2011-12-29 MED ORDER — PROMETHAZINE HCL 25 MG PO TABS: 25.0000 mg | ORAL_TABLET | Freq: Four times a day (QID) | ORAL | Status: DC | PRN

## 2011-12-29 MED ORDER — HYDROCODONE-ACETAMINOPHEN 5-500 MG PO TABS: 1.0000 | ORAL_TABLET | Freq: Four times a day (QID) | ORAL | Status: DC | PRN

## 2011-12-29 MED ORDER — ONDANSETRON HCL 4 MG/2ML IJ SOLN
4.0000 mg | Freq: Once | INTRAMUSCULAR | Status: AC
  Administered 2011-12-29: 4 mg via INTRAVENOUS
  Filled 2011-12-29: qty 2

## 2011-12-29 NOTE — ED Notes (Signed)
Pt presents to department for evaluation of diffuse abdominal pain and N/V. Onset yesterday. Pt states pancreatitis, and he ran out of pain medication several days ago. 9/10 pain at the time. Denies fever. Denies urinary symptoms. He is alert and oriented x4. No signs of acute distress noted.

## 2011-12-29 NOTE — ED Notes (Signed)
Pt stated that he has been having sharp abdominal pain x 1 week. Pt has been also having n/v. No blood in vomit. No loose stools. No CP or SOB. Pt stated that he has these same s/s when he has pancreatits flare ups.

## 2011-12-29 NOTE — ED Provider Notes (Signed)
History     CSN: [id number]  Arrival date & time 12/29/11  1713   First MD Initiated Contact with Patient 12/29/11 1904      Chief Complaint  Patient presents with  . Abdominal Pain  . Emesis    (Consider location/radiation/quality/duration/timing/severity/associated sxs/prior treatment) HPI Comments: Anzio Orejel is a 41 y.o. male who presents with complaint of abdominal pain, nausea, vomiting, diarrhea for two days. States has hx of pancreatitis, feels the same. States chills at home, did not measure temperature. Pt states chronically on morphine at home for "pancrease" but ran out several days ago. States this was prescribed to him by his physician at [facility], but states they closed and he now does not have a pcp. Pt states unable to keep anything down. Denies alcohol use. Denies drugs. No other complaitns.    Past Medical History  Diagnosis Date  . Asthma   . Pancreatitis     No past surgical history on file.  History reviewed. No pertinent family history.  History  Substance Use Topics  . Smoking status: Current Some Day Smoker    Types: Cigarettes  . Smokeless tobacco: Not on file  . Alcohol Use: No      Review of Systems  Gastrointestinal: Positive for nausea, vomiting, abdominal pain and diarrhea. Negative for blood in stool, abdominal distention and rectal pain.  Genitourinary: Negative for dysuria and flank pain.  Musculoskeletal: Positive for back pain.  Neurological: Negative for dizziness, weakness and headaches.  All other systems reviewed and are negative.    Allergies  Aspirin and Ibuprofen  Home Medications   Current Outpatient Rx  Name  Route  Sig  Dispense  Refill  . MORPHINE SULFATE ER 15 MG PO TB12   Oral   Take 15 mg by mouth 2 (two) times daily.         . MORPHINE SULFATE ER 30 MG PO TB12   Oral   Take 30 mg by mouth 2 (two) times daily.         [name] OMEPRAZOLE 20 MG PO CPDR   Oral   Take 20 mg by mouth daily.         [name] PROMETHAZINE HCL 25 MG PO TABS   Oral   Take 25 mg by mouth every 6 (six) hours as needed. For nausea         . SULFAMETHOXAZOLE-TMP DS 800-160 MG PO TABS   Oral   Take 1 tablet by mouth 2 (two) times daily.         . TRAZODONE HCL 50 MG PO TABS   Oral   Take 200 mg by mouth at bedtime.         [name] PROMETHAZINE HCL 25 MG PO TABS   Oral   Take 1 tablet (25 mg total) by mouth every 6 (six) hours as needed for nausea.   30 tablet   0     BP 126/108  Pulse 65  Temp 98.1 F (36.7 C) (Oral)  Resp 20  SpO2 99%  Physical Exam  Nursing note and vitals reviewed. Constitutional: He is oriented to person, place, and time. He appears well-developed and well-nourished. No distress.  HENT:  Head: Normocephalic.  Eyes: Conjunctivae normal are normal.  Neck: Neck supple.  Cardiovascular: Normal rate, regular rhythm and normal heart sounds.   Pulmonary/Chest: Effort normal and breath sounds normal. No respiratory distress. He has no wheezes. He has no rales.  Abdominal: Soft. Bowel sounds are normal. He  exhibits no distension. There is tenderness. There is guarding.       Epigastric and LUQ tenderness  Musculoskeletal: He exhibits no edema.  Neurological: He is alert and oriented to person, place, and time.  Skin: Skin is warm and dry.    ED Course  Procedures (including critical care time)  Pt with chronic pancreatitis. Will get labs, pain meds, and anti emetics, fluids ordered.   Results for orders placed during the hospital encounter of 12/29/11  CBC WITH DIFFERENTIAL      Component Value Range   WBC 4.4  4.0 - 10.5 K/uL   RBC 4.50  4.22 - 5.81 MIL/uL   Hemoglobin 14.3  13.0 - 17.0 g/dL   HCT 69.6  29.5 - 28.4 %   MCV 88.4  78.0 - 100.0 fL   MCH 31.8  26.0 - 34.0 pg   MCHC 35.9  30.0 - 36.0 g/dL   RDW 13.2  44.0 - 10.2 %   Platelets 206  150 - 400 K/uL   Neutrophils Relative 32 (*) 43 - 77 %   Neutro Abs 1.4 (*) 1.7 - 7.7 K/uL   Lymphocytes Relative 57 (*) 12 -  46 %   Lymphs Abs 2.5  0.7 - 4.0 K/uL   Monocytes Relative 10  3 - 12 %   Monocytes Absolute 0.4  0.1 - 1.0 K/uL   Eosinophils Relative 1  0 - 5 %   Eosinophils Absolute 0.0  0.0 - 0.7 K/uL   Basophils Relative 0  0 - 1 %   Basophils Absolute 0.0  0.0 - 0.1 K/uL  COMPREHENSIVE METABOLIC PANEL      Component Value Range   Sodium 131 (*) 135 - 145 mEq/L   Potassium 3.6  3.5 - 5.1 mEq/L   Chloride 98  96 - 112 mEq/L   CO2 24  19 - 32 mEq/L   Glucose, Bld 99  70 - 99 mg/dL   BUN 6  6 - 23 mg/dL   Creatinine, Ser 7.25  0.50 - 1.35 mg/dL   Calcium 8.9  8.4 - 36.6 mg/dL   Total Protein 6.9  6.0 - 8.3 g/dL   Albumin 3.7  3.5 - 5.2 g/dL   AST 19  0 - 37 U/L   ALT 14  0 - 53 U/L   Alkaline Phosphatase 51  39 - 117 U/L   Total Bilirubin 0.4  0.3 - 1.2 mg/dL   GFR calc non Af Amer >90  >90 mL/min   GFR calc Af Amer >90  >90 mL/min  LIPASE, BLOOD      Component Value Range   Lipase 19  11 - 59 U/L  URINALYSIS, ROUTINE W REFLEX MICROSCOPIC      Component Value Range   Color, Urine AMBER (*) YELLOW   APPearance CLOUDY (*) CLEAR   Specific Gravity, Urine 1.031 (*) 1.005 - 1.030   pH 6.0  5.0 - 8.0   Glucose, UA NEGATIVE  NEGATIVE mg/dL   Hgb urine dipstick NEGATIVE  NEGATIVE   Bilirubin Urine SMALL (*) NEGATIVE   Ketones, ur NEGATIVE  NEGATIVE mg/dL   Protein, ur NEGATIVE  NEGATIVE mg/dL   Urobilinogen, UA 1.0  0.0 - 1.0 mg/dL   Nitrite NEGATIVE  NEGATIVE   Leukocytes, UA NEGATIVE  NEGATIVE   Dg Abd Acute W/chest  12/29/2011  *RADIOLOGY REPORT*  Clinical Data: Abdominal pain, tender  ACUTE ABDOMEN SERIES (ABDOMEN 2 VIEW & CHEST 1 VIEW)  Comparison: 07/12/2009  Findings:  COPD.  No active infiltrates or nodules.  Normal heart size.  No effusion or pneumothorax.  Bones unremarkable. Nonobstructive gas pattern.  No free air.  No abnormal calcifications.  Similar appearance to priors.  IMPRESSION: Negative exam.   Original Report Authenticated By: Davonna Belling, M.D.        1. Abdominal  pain       MDM  Negative labs, x-ray. Pt has received 3 rounds of dilaudid 1mg  IV, zofran total of 4mg  iv. Pt feeling better. He is tolerating PO fluids. He states he is ready to go home. Abdomen reassessed. Soft, non tender. No evidence of surgical abdomen. i do not think pt needs CT scan at this time or any further work up. Instructed to return if symptoms worsening.  Will d/c home with clear fluids/bowel rest. Pain meds. Anti emetics. Follow up.           Lottie Mussel, PA 12/30/11 0130

## 2011-12-30 NOTE — ED Provider Notes (Signed)
Medical screening examination/treatment/procedure(s) were performed by non-physician practitioner and as supervising physician I was immediately available for consultation/collaboration.   Rolan Bucco, MD 12/30/11 1010

## 2012-01-07 ENCOUNTER — Encounter (HOSPITAL_COMMUNITY): Payer: Self-pay | Admitting: Emergency Medicine

## 2012-01-07 ENCOUNTER — Emergency Department (HOSPITAL_COMMUNITY)
Admission: EM | Admit: 2012-01-07 | Discharge: 2012-01-07 | Disposition: A | Discharge status: Home / Self Care | Payer: Medicaid Other | Attending: Emergency Medicine | Admitting: Emergency Medicine

## 2012-01-07 DIAGNOSIS — R197 Diarrhea, unspecified: Secondary | ICD-10-CM | POA: Insufficient documentation

## 2012-01-07 DIAGNOSIS — R1013 Epigastric pain: Secondary | ICD-10-CM | POA: Insufficient documentation

## 2012-01-07 DIAGNOSIS — R112 Nausea with vomiting, unspecified: Secondary | ICD-10-CM | POA: Insufficient documentation

## 2012-01-07 DIAGNOSIS — G8929 Other chronic pain: Secondary | ICD-10-CM | POA: Insufficient documentation

## 2012-01-07 DIAGNOSIS — F17200 Nicotine dependence, unspecified, uncomplicated: Secondary | ICD-10-CM | POA: Insufficient documentation

## 2012-01-07 DIAGNOSIS — K861 Other chronic pancreatitis: Secondary | ICD-10-CM | POA: Insufficient documentation

## 2012-01-07 DIAGNOSIS — J45909 Unspecified asthma, uncomplicated: Secondary | ICD-10-CM | POA: Insufficient documentation

## 2012-01-07 DIAGNOSIS — Z79899 Other long term (current) drug therapy: Secondary | ICD-10-CM | POA: Insufficient documentation

## 2012-01-07 LAB — URINALYSIS, ROUTINE W REFLEX MICROSCOPIC
Bilirubin Urine: NEGATIVE
Glucose, UA: NEGATIVE mg/dL
Hgb urine dipstick: NEGATIVE
Ketones, ur: NEGATIVE mg/dL
Leukocytes, UA: NEGATIVE
Nitrite: NEGATIVE
Protein, ur: NEGATIVE mg/dL
Specific Gravity, Urine: 1.023 (ref 1.005–1.030)
Urobilinogen, UA: 0.2 mg/dL (ref 0.0–1.0)
pH: 7 (ref 5.0–8.0)

## 2012-01-07 LAB — CBC WITH DIFFERENTIAL/PLATELET
Basophils Absolute: 0 10*3/uL (ref 0.0–0.1)
Basophils Relative: 1 % (ref 0–1)
Eosinophils Absolute: 0.1 10*3/uL (ref 0.0–0.7)
Eosinophils Relative: 1 % (ref 0–5)
HCT: 41.5 % (ref 39.0–52.0)
Hemoglobin: 14.5 g/dL (ref 13.0–17.0)
Lymphocytes Relative: 61 % — ABNORMAL HIGH (ref 12–46)
Lymphs Abs: 2.5 10*3/uL (ref 0.7–4.0)
MCH: 31.4 pg (ref 26.0–34.0)
MCHC: 34.9 g/dL (ref 30.0–36.0)
MCV: 89.8 fL (ref 78.0–100.0)
Monocytes Absolute: 0.4 10*3/uL (ref 0.1–1.0)
Monocytes Relative: 9 % (ref 3–12)
Neutro Abs: 1.2 10*3/uL — ABNORMAL LOW (ref 1.7–7.7)
Neutrophils Relative %: 29 % — ABNORMAL LOW (ref 43–77)
Platelets: 189 10*3/uL (ref 150–400)
RBC: 4.62 MIL/uL (ref 4.22–5.81)
RDW: 13.6 % (ref 11.5–15.5)
WBC: 4.1 10*3/uL (ref 4.0–10.5)

## 2012-01-07 LAB — BASIC METABOLIC PANEL
BUN: 7 mg/dL (ref 6–23)
CO2: 28 mEq/L (ref 19–32)
Calcium: 9 mg/dL (ref 8.4–10.5)
Chloride: 100 mEq/L (ref 96–112)
Creatinine, Ser: 1.01 mg/dL (ref 0.50–1.35)
GFR calc Af Amer: >90 mL/min (ref >90)
GFR calc non Af Amer: 89 mL/min — ABNORMAL LOW (ref >90)
Glucose, Bld: 89 mg/dL (ref 70–99)
Potassium: 3.8 mEq/L (ref 3.5–5.1)
Sodium: 135 mEq/L (ref 135–145)

## 2012-01-07 LAB — LIPASE, BLOOD: Lipase: 26 U/L (ref 11–59)

## 2012-01-07 MED ORDER — HYDROMORPHONE HCL PF 1 MG/ML IJ SOLN
1.0000 mg | Freq: Once | INTRAMUSCULAR | Status: AC
  Administered 2012-01-07: 1 mg via INTRAVENOUS
  Filled 2012-01-07: qty 1

## 2012-01-07 MED ORDER — ONDANSETRON HCL 4 MG/2ML IJ SOLN
4.0000 mg | Freq: Once | INTRAMUSCULAR | Status: AC
  Administered 2012-01-07: 4 mg via INTRAVENOUS
  Filled 2012-01-07: qty 2

## 2012-01-07 NOTE — ED Provider Notes (Signed)
Medical screening examination/treatment/procedure(s) were performed by non-physician practitioner and as supervising physician I was immediately available for consultation/collaboration.  Flint Melter, MD 01/07/12 2330

## 2012-01-07 NOTE — ED Notes (Signed)
Pt c/o abdominal pain and tenderness upon palpation; pt states N/V/D starting this morning. Pt mentating appropriately. Pt denies shortness of breath and dizziness.

## 2012-01-07 NOTE — ED Provider Notes (Signed)
History     CSN: [id number]  Arrival date & time 01/07/12  1511   First MD Initiated Contact with Patient 01/07/12 1755      Chief Complaint  Patient presents with  . Abdominal Pain  . Emesis    (Consider location/radiation/quality/duration/timing/severity/associated sxs/prior treatment) HPI  41 year old male with hx of pancreatitis presents c/o abd pain.  Pt reports pain felt similar to his chronic pancreatitis with the last flare last week in which he was evaluated for in the ED>  Sts he is still not feeling better.  Unable to tolerates anything by mouth due to nausea and vomiting.  Unable to take his usual medication.  Denies fever, chills, cp, sob, dysuria.  Pt f/o with GI Dr. Jarold Motto and he reports he was diagnosed with pancreatitis in 2008, likely 2/2 alcohol use, but has stop drinking since.  Denies hx of diabetes.  He's unsure what caused this particular flare.     Past Medical History  Diagnosis Date  . Asthma   . Pancreatitis     History reviewed. No pertinent past surgical history.  History reviewed. No pertinent family history.  History  Substance Use Topics  . Smoking status: Current Some Day Smoker    Types: Cigarettes  . Smokeless tobacco: Not on file  . Alcohol Use: No      Review of Systems  Constitutional: Negative for fever.  Gastrointestinal: Positive for nausea, vomiting, abdominal pain and diarrhea.  Genitourinary: Negative for dysuria.  Musculoskeletal: Negative for back pain.  Skin: Negative for rash and wound.  Neurological: Negative for numbness.    Allergies  Aspirin and Ibuprofen  Home Medications   Current Outpatient Rx  Name  Route  Sig  Dispense  Refill  . HYDROCODONE-ACETAMINOPHEN 5-500 MG PO TABS   Oral   Take 1-2 tablets by mouth every 6 (six) hours as needed for pain.   15 tablet   0   . MORPHINE SULFATE ER 15 MG PO TB12   Oral   Take 15 mg by mouth 2 (two) times daily.         . MORPHINE SULFATE ER 30 MG PO  TB12   Oral   Take 30 mg by mouth 2 (two) times daily.         [name] OMEPRAZOLE 20 MG PO CPDR   Oral   Take 20 mg by mouth daily.         [name] PROMETHAZINE HCL 25 MG PO TABS   Oral   Take 1 tablet (25 mg total) by mouth every 6 (six) hours as needed for nausea.   30 tablet   0   . PROMETHAZINE HCL 25 MG PO TABS   Oral   Take 25 mg by mouth every 6 (six) hours as needed. For nausea         . PROMETHAZINE HCL 25 MG PO TABS   Oral   Take 1 tablet (25 mg total) by mouth every 6 (six) hours as needed for nausea.   20 tablet   0   . SULFAMETHOXAZOLE-TMP DS 800-160 MG PO TABS   Oral   Take 1 tablet by mouth 2 (two) times daily.         . TRAZODONE HCL 50 MG PO TABS   Oral   Take 200 mg by mouth at bedtime.           BP 138/75  Pulse 71  Temp 98.6 F (37 C) (Oral)  Resp 18  SpO2 99%  Physical Exam  Nursing note and vitals reviewed. Constitutional: He appears well-developed and well-nourished. No distress.       Awake, alert, nontoxic appearance  HENT:  Head: Atraumatic.  Eyes: Conjunctivae normal are normal. Right eye exhibits no discharge. Left eye exhibits no discharge.  Neck: Normal range of motion. Neck supple.  Cardiovascular: Normal rate and regular rhythm.   Pulmonary/Chest: Effort normal. No respiratory distress. He exhibits no tenderness.  Abdominal: Soft. There is no tenderness (Mild epigastrium tenderness on exam without guarding or rebound). There is no rebound.  Musculoskeletal: He exhibits no edema and no tenderness.       ROM appears intact, no obvious focal weakness  Neurological: He is alert.  Skin: Skin is warm and dry. No rash noted.  Psychiatric: He has a normal mood and affect.    ED Course  Procedures (including critical care time)  Labs Reviewed  CBC WITH DIFFERENTIAL - Abnormal; Notable for the following:    Neutrophils Relative 29 (*)     Neutro Abs 1.2 (*)     Lymphocytes Relative 61 (*)     All other components within normal  limits  BASIC METABOLIC PANEL - Abnormal; Notable for the following:    GFR calc non Af Amer 89 (*)     All other components within normal limits  LIPASE, BLOOD  URINALYSIS, ROUTINE W REFLEX MICROSCOPIC   No results found.   No diagnosis found.  Results for orders placed during the hospital encounter of 01/07/12  CBC WITH DIFFERENTIAL      Component Value Range   WBC 4.1  4.0 - 10.5 K/uL   RBC 4.62  4.22 - 5.81 MIL/uL   Hemoglobin 14.5  13.0 - 17.0 g/dL   HCT 45.4  09.8 - 11.9 %   MCV 89.8  78.0 - 100.0 fL   MCH 31.4  26.0 - 34.0 pg   MCHC 34.9  30.0 - 36.0 g/dL   RDW 14.7  82.9 - 56.2 %   Platelets 189  150 - 400 K/uL   Neutrophils Relative 29 (*) 43 - 77 %   Neutro Abs 1.2 (*) 1.7 - 7.7 K/uL   Lymphocytes Relative 61 (*) 12 - 46 %   Lymphs Abs 2.5  0.7 - 4.0 K/uL   Monocytes Relative 9  3 - 12 %   Monocytes Absolute 0.4  0.1 - 1.0 K/uL   Eosinophils Relative 1  0 - 5 %   Eosinophils Absolute 0.1  0.0 - 0.7 K/uL   Basophils Relative 1  0 - 1 %   Basophils Absolute 0.0  0.0 - 0.1 K/uL  BASIC METABOLIC PANEL      Component Value Range   Sodium 135  135 - 145 mEq/L   Potassium 3.8  3.5 - 5.1 mEq/L   Chloride 100  96 - 112 mEq/L   CO2 28  19 - 32 mEq/L   Glucose, Bld 89  70 - 99 mg/dL   BUN 7  6 - 23 mg/dL   Creatinine, Ser 1.30  0.50 - 1.35 mg/dL   Calcium 9.0  8.4 - 86.5 mg/dL   GFR calc non Af Amer 89 (*) >90 mL/min   GFR calc Af Amer >90  >90 mL/min  URINALYSIS, ROUTINE W REFLEX MICROSCOPIC      Component Value Range   Color, Urine YELLOW  YELLOW   APPearance CLEAR  CLEAR   Specific Gravity, Urine 1.023  1.005 - 1.030  pH 7.0  5.0 - 8.0   Glucose, UA NEGATIVE  NEGATIVE mg/dL   Hgb urine dipstick NEGATIVE  NEGATIVE   Bilirubin Urine NEGATIVE  NEGATIVE   Ketones, ur NEGATIVE  NEGATIVE mg/dL   Protein, ur NEGATIVE  NEGATIVE mg/dL   Urobilinogen, UA 0.2  0.0 - 1.0 mg/dL   Nitrite NEGATIVE  NEGATIVE   Leukocytes, UA NEGATIVE  NEGATIVE  LIPASE, BLOOD       Component Value Range   Lipase 26  11 - 59 U/L   Dg Abd Acute W/chest  12/29/2011  *RADIOLOGY REPORT*  Clinical Data: Abdominal pain, tender  ACUTE ABDOMEN SERIES (ABDOMEN 2 VIEW & CHEST 1 VIEW)  Comparison: 07/12/2009  Findings: COPD.  No active infiltrates or nodules.  Normal heart size.  No effusion or pneumothorax.  Bones unremarkable. Nonobstructive gas pattern.  No free air.  No abnormal calcifications.  Similar appearance to priors.  IMPRESSION: Negative exam.   Original Report Authenticated By: Davonna Belling, M.D.     1. Abdominal pain  MDM  Pt sts he has hx of pancreatitis.  However, his lipase has been WNL each time he presents to ER for "pancreatitis" complaint.  He has normal lipase today and normal electrolytes.  He has a non surgical abdomen.  Doubt emergent pathology today.  I recommend pt to f/u with his GI specialist Dr. Jarold Motto for further management.    7:13 PM Labs are unremarkable.  Pt felt better, able to tolerate po.  Recommend f/u with GI.  Pt voice understanding and agrees with plan.    BP 138/75  Pulse 71  Temp 98.6 F (37 C) (Oral)  Resp 18  SpO2 99%  I have reviewed nursing notes and vital signs. I personally reviewed the imaging tests through PACS system  I reviewed available ER/hospitalization records thought the EMR      Fayrene Helper, [place] 01/07/12 1914

## 2012-01-07 NOTE — ED Notes (Signed)
Pt c/o generalized abd pain that feels like chronic pancreatitis with vomiting; pt seen for same on Sunday and still not feeling better

## 2013-03-26 ENCOUNTER — Emergency Department (HOSPITAL_COMMUNITY)
Admission: EM | Admit: 2013-03-26 | Discharge: 2013-03-26 | Disposition: A | Discharge status: Home / Self Care | Payer: Medicaid Other | Attending: Emergency Medicine | Admitting: Emergency Medicine

## 2013-03-26 ENCOUNTER — Encounter (HOSPITAL_COMMUNITY): Payer: Self-pay | Admitting: Emergency Medicine

## 2013-03-26 DIAGNOSIS — F17200 Nicotine dependence, unspecified, uncomplicated: Secondary | ICD-10-CM | POA: Insufficient documentation

## 2013-03-26 DIAGNOSIS — R109 Unspecified abdominal pain: Secondary | ICD-10-CM

## 2013-03-26 DIAGNOSIS — R112 Nausea with vomiting, unspecified: Secondary | ICD-10-CM | POA: Insufficient documentation

## 2013-03-26 DIAGNOSIS — R197 Diarrhea, unspecified: Secondary | ICD-10-CM | POA: Insufficient documentation

## 2013-03-26 DIAGNOSIS — R1013 Epigastric pain: Secondary | ICD-10-CM | POA: Insufficient documentation

## 2013-03-26 DIAGNOSIS — Z8719 Personal history of other diseases of the digestive system: Secondary | ICD-10-CM | POA: Insufficient documentation

## 2013-03-26 DIAGNOSIS — J45909 Unspecified asthma, uncomplicated: Secondary | ICD-10-CM | POA: Insufficient documentation

## 2013-03-26 DIAGNOSIS — N342 Other urethritis: Secondary | ICD-10-CM | POA: Insufficient documentation

## 2013-03-26 LAB — CBC WITH DIFFERENTIAL/PLATELET
Basophils Absolute: 0 10*3/uL (ref 0.0–0.1)
Basophils Relative: 1 % (ref 0–1)
Eosinophils Absolute: 0 10*3/uL (ref 0.0–0.7)
Eosinophils Relative: 1 % (ref 0–5)
HCT: 40 % (ref 39.0–52.0)
Hemoglobin: 14.1 g/dL (ref 13.0–17.0)
Lymphocytes Relative: 60 % — ABNORMAL HIGH (ref 12–46)
Lymphs Abs: 2.5 10*3/uL (ref 0.7–4.0)
MCH: 31.5 pg (ref 26.0–34.0)
MCHC: 35.3 g/dL (ref 30.0–36.0)
MCV: 89.3 fL (ref 78.0–100.0)
Monocytes Absolute: 0.4 10*3/uL (ref 0.1–1.0)
Monocytes Relative: 11 % (ref 3–12)
Neutro Abs: 1.2 10*3/uL — ABNORMAL LOW (ref 1.7–7.7)
Neutrophils Relative %: 29 % — ABNORMAL LOW (ref 43–77)
Platelets: 187 10*3/uL (ref 150–400)
RBC: 4.48 MIL/uL (ref 4.22–5.81)
RDW: 13.8 % (ref 11.5–15.5)
WBC: 4.2 10*3/uL (ref 4.0–10.5)

## 2013-03-26 LAB — COMPREHENSIVE METABOLIC PANEL
ALT: 13 U/L (ref 0–53)
AST: 18 U/L (ref 0–37)
Albumin: 3.4 g/dL — ABNORMAL LOW (ref 3.5–5.2)
Alkaline Phosphatase: 47 U/L (ref 39–117)
BUN: 7 mg/dL (ref 6–23)
CO2: 25 mEq/L (ref 19–32)
Calcium: 8.7 mg/dL (ref 8.4–10.5)
Chloride: 96 mEq/L (ref 96–112)
Creatinine, Ser: 0.97 mg/dL (ref 0.50–1.35)
GFR calc Af Amer: >90 mL/min (ref >90)
GFR calc non Af Amer: >90 mL/min (ref >90)
Glucose, Bld: 86 mg/dL (ref 70–99)
Potassium: 3.9 mEq/L (ref 3.7–5.3)
Sodium: 133 mEq/L — ABNORMAL LOW (ref 137–147)
Total Bilirubin: 0.5 mg/dL (ref 0.3–1.2)
Total Protein: 6.8 g/dL (ref 6.0–8.3)

## 2013-03-26 LAB — LIPASE, BLOOD: Lipase: 14 U/L (ref 11–59)

## 2013-03-26 MED ORDER — ONDANSETRON HCL 4 MG/2ML IJ SOLN
4.0000 mg | Freq: Once | INTRAMUSCULAR | Status: AC
  Administered 2013-03-26: 4 mg via INTRAVENOUS
  Filled 2013-03-26: qty 2

## 2013-03-26 MED ORDER — HYDROMORPHONE HCL PF 1 MG/ML IJ SOLN
1.0000 mg | Freq: Once | INTRAMUSCULAR | Status: AC
  Administered 2013-03-26: 1 mg via INTRAVENOUS
  Filled 2013-03-26: qty 1

## 2013-03-26 MED ORDER — CEFTRIAXONE SODIUM 250 MG IJ SOLR
250.0000 mg | Freq: Once | INTRAMUSCULAR | Status: AC
  Administered 2013-03-26: 250 mg via INTRAMUSCULAR
  Filled 2013-03-26: qty 250

## 2013-03-26 MED ORDER — ONDANSETRON 4 MG PO TBDP: 4.0000 mg | ORAL_TABLET | Freq: Three times a day (TID) | ORAL | Status: DC | PRN

## 2013-03-26 MED ORDER — FAMOTIDINE 20 MG PO TABS
40.0000 mg | ORAL_TABLET | Freq: Once | ORAL | Status: AC
Start: 2013-03-26 — End: 2013-03-26
  Administered 2013-03-26: 40 mg via ORAL
  Filled 2013-03-26: qty 2

## 2013-03-26 MED ORDER — AZITHROMYCIN 250 MG PO TABS
1000.0000 mg | ORAL_TABLET | Freq: Once | ORAL | Status: AC
  Administered 2013-03-26: 1000 mg via ORAL
  Filled 2013-03-26: qty 4

## 2013-03-26 MED ORDER — OXYCODONE-ACETAMINOPHEN 5-325 MG PO TABS: 1.0000 | ORAL_TABLET | Freq: Four times a day (QID) | ORAL | Status: DC | PRN

## 2013-03-26 NOTE — Discharge Instructions (Signed)
Please read and follow all provided instructions.  Your diagnoses today include:  1. Nausea and vomiting    Tests performed today include:  Blood counts and electrolytes  Blood tests to check liver and kidney function  Blood tests to check pancreas function  Vital signs. See below for your results today.    Medications prescribed:   Percocet (oxycodone/acetaminophen) - narcotic pain medication  DO NOT drive or perform any activities that require you to be awake and alert because this medicine can make you drowsy. BE VERY CAREFUL not to take multiple medicines containing Tylenol (also called acetaminophen). Doing so can lead to an overdose which can damage your liver and cause liver failure and possibly death.   Zofran (ondansetron) - for nausea and vomiting  Home care instructions:   Follow any educational materials contained in this packet.   Your abdominal pain, nausea, vomiting, and diarrhea may be caused by a viral gastroenteritis also called 'stomach flu'. You should rest for the next several days. Keep drinking plenty of fluids and use the medicine for nausea as directed.    Drink clear liquids for the next 24 hours and introduce solid foods slowly after 24 hours using the b.r.a.t. diet (Bananas, Rice, Applesauce, Toast, Yogurt).    Follow-up instructions: Please follow-up with your primary care provider in the next 2 days for further evaluation of your symptoms. If you are not feeling better in 48 hours you may have a condition that is more serious and you need re-evaluation. If you do not have a primary care doctor -- see below for referral information.   Return instructions:  SEEK IMMEDIATE MEDICAL ATTENTION IF:  If you have pain that does not go away or becomes severe   A temperature above 101F develops   Repeated vomiting occurs (multiple episodes)   If you have pain that becomes localized to portions of the abdomen. The right side could possibly be  appendicitis. In an adult, the left lower portion of the abdomen could be colitis or diverticulitis.   Blood is being passed in stools or vomit (bright red or black tarry stools)   You develop chest pain, difficulty breathing, dizziness or fainting, or become confused, poorly responsive, or inconsolable (young children)  If you have any other emergent concerns regarding your health  Additional Information: Abdominal (belly) pain can be caused by many things. Your caregiver performed an examination and possibly ordered blood/urine tests and imaging (CT scan, x-rays, ultrasound). Many cases can be observed and treated at home after initial evaluation in the emergency department. Even though you are being discharged home, abdominal pain can be unpredictable. Therefore, you need a repeated exam if your pain does not resolve, returns, or worsens. Most patients with abdominal pain don't have to be admitted to the hospital or have surgery, but serious problems like appendicitis and gallbladder attacks can start out as nonspecific pain. Many abdominal conditions cannot be diagnosed in one visit, so follow-up evaluations are very important.  Your vital signs today were: BP 126/87   Pulse 67   Temp(Src) 97.6 F (36.4 C) (Oral)   Resp 18   Ht 6\' 2"  (1.88 m)   Wt 184 lb (83.462 kg)   BMI 23.61 kg/m2   SpO2 100% If your blood pressure (bp) was elevated above 135/85 this visit, please have this repeated by your doctor within one month. --------------  Tests performed today include:  Test for gonorrhea and chlamydia. You will be notified by telephone if you have  a positive result.  Vital signs. See below for your results today.   Medications:  You were treated for chlamydia (1 gram azithromycin pills) and gonorrhea (250mg  rocephin shot).  Home care instructions:  Read educational materials contained in this packet and follow any instructions provided.   You should tell your partners about your  infection and avoid having sex for one week to allow time for the medicine to work.  Follow-up instructions: You should follow-up with the [facility] STD clinic to be tested for HIV, syphilis, and hepatitis -- all of which can be transmitted by sexual contact. We do not routinely screen for these in the Emergency Department.  STD Testing:  [place] [place] [facility]  [address], [place], phone [phone] or [phone]    Monday - Friday, call for an appointment  [place] [place] [facility]  [address], [place], phone [phone] or [phone]   Monday - Friday, call for an appointment   If you do not have a primary care doctor -- see below for referral information.   Return instructions:   Please return to the Emergency Department if you experience worsening symptoms.   Please return if you have any other emergent concerns.    Emergency Department Resource Guide 1) Find a Doctor and Pay Out of Pocket Although you won't have to find out who is covered by your insurance plan, it is a good idea to ask around and get recommendations. You will then need to call the office and see if the doctor you have chosen will accept you as a new patient and what types of options they offer for patients who are self-pay. Some doctors offer discounts or will set up payment plans for their patients who do not have insurance, but you will need to ask so you aren't surprised when you get to your appointment.  2) Contact Your Local Health Department Not all health departments have doctors that can see patients for sick visits, but many do, so it is worth a call to see if yours does. If you don't know where your local health department is, you can check in your phone book. The CDC also has a tool to help you locate your state's health department, and many state websites also have listings of all of  their local health departments.  3) Find a [facility] If your illness is not likely to be very severe or complicated, you may want to try a walk in clinic. These are popping up all over the country in pharmacies, drugstores, and shopping centers. They're usually staffed by nurse practitioners or physician assistants that have been trained to treat common illnesses and complaints. They're usually fairly quick and inexpensive. However, if you have serious medical issues or chronic medical problems, these are probably not your best option.  No Primary Care Doctor: - Call Health Connect at  [phone] - they can help you locate a primary care doctor that  accepts your insurance, provides certain services, etc. - Physician Referral Service- [phone]  Chronic Pain Problems: Organization         Address  Phone   Notes  [facility]  [phone] Patients need to be referred by their primary care doctor.   Medication Assistance: Organization         Address  Phone   Notes  [facility] Medication Assistance Program [address]  [place], [place] [address] [phone] --Must be a resident of [facility] -- Must have NO insurance coverage whatsoever (no Medicaid/ Medicare, etc.) -- The pt. MUST have a primary care doctor that directs their care regularly and follows them in the community   MedAssist  [phone]   [employer]  4193872915    Agencies that provide inexpensive medical care: Organization         Address  Phone   Notes  [place]  [phone]   [facility]    [phone]   [facility] [place], [place] [address] [phone]   [place] [address], [place] [phone]   Planned Parenthood    [phone]   [facility]    [phone]   [place] and [place] [address],  [place] Phone:  [phone], Fax:  [phone] Hours of Operation:  9 am - 6 pm, M-F.  Also accepts Medicaid/Medicare and self-pay.  [facility] for [place] [address], [address], [place] Phone: [phone], Fax: [phone]. Hours of Operation:  8:30 am - 5:30 pm, M-F.  Also accepts Medicaid and self-pay.  [facility] [address], [place] Phone: [phone]   [place], [place], [place] [phone], Ext. [phone] Mondays & Thursdays: 7-9 AM.  First 15 patients are seen on a first come, first serve basis.    [place] Providers:  Organization         Address  Phone   Notes  [facility] [address], [place] [phone] Also accepts self-pay patients.  [facility] [id number] [place], [place]  [phone]   [place], [address], [place] [phone]   [facility] [address], [place] [phone]   Lucianne Lei [address], [place]   [phone] Only accepts [place] Access [place] patients after they have their name applied to their card.   Self-Pay (no insurance) in [facility]:  Organization         Address  Phone   Notes  Sickle Cell Patients, [facility] [place] [phone]   [facility] Urgent Care [place] [phone]   [facility] [place]  [place], [place], [place] [phone]   [facility]/Dr. Osei-Bonsu  [address] [place] or [address], [place] [phone] Phone number for both [place] and [place] locations is the same.  Urgent Medical and [facility] [address] [place] [phone]   [facility] [address], [place] or [address] [phone]   [facility] [address] [place] [phone], phone; [phone], fax Sees patients 1st and 3rd Saturday of every month.  Must not qualify for public or private insurance (i.e. Medicaid, Medicare, [place] Health Choice, Veterans' Benefits)  Household income should be no more than 200% of the poverty level The clinic cannot treat you if you are pregnant or think you are pregnant  Sexually transmitted diseases are not treated at the clinic.    Dental Care:  Organization         Address  Phone  Notes  [facility] Department of [facility] [place] [phone] Accepts children up to age 86 who are enrolled in [place] or [place]; pregnant women with a Medicaid card; and children who have applied for Medicaid or [place] Health Choice, but were declined, whose parents can pay a reduced fee at time of service.  [facility] Department of [facility]  [address], [place] [phone] Accepts children up to age 85 who are enrolled in [place] or [place]; pregnant women with a Medicaid card; and children who have applied for Medicaid or [place] Health Choice, but were declined, whose parents can pay a reduced fee at time of service.  [place] Adult Dental Access PROGRAM  [place] [phone] Patients are seen by appointment only. Walk-ins are not accepted. [place] will see patients 18 years of age and older. Monday - Tuesday (8am-5pm) Most Wednesdays (8:30-5pm) $30 per visit, cash only  [facility] Adult Dental Access PROGRAM  [address], [facility] [phone] Patients are seen by appointment only. Walk-ins are not accepted. [place] will see patients 68 years of age and older. One Wednesday Evening (Monthly: Volunteer Based).  $30 per visit, cash only  [place]   [phone] for adults; Children under age 24, call Graduate Pediatric Dentistry at [phone]. Children aged 74-14, please call [phone] to request a pediatric application.  Dental services are provided in all areas of dental care including fillings, crowns and bridges, complete and partial dentures, implants, gum treatment, root canals, and extractions. Preventive care is also provided. Treatment is provided to both adults and children. Patients are selected via a lottery and there is often a waiting list.   [facility] [address] [place]  [phone] [email or web address]   [facility] [address] [place], [place] [phone], Ext. [phone] Second and Fourth Thursday of each month, opens at 6:30 AM; Clinic ends at 9 AM.  Patients are seen on a first-come first-served basis, and a limited number are seen during each clinic.   [facility]  [address] Hillard Danker [place], [place] [phone]   Eligibility Requirements You must have lived in [place], [place], or [place] counties for at least the last three months.   You cannot be eligible for state or federal sponsored [employer], including [employer], [place], or [employer].   You generally cannot be eligible for healthcare insurance through your employer.    How to apply: Eligibility screenings are held every Tuesday and Wednesday afternoon from 1:00 pm until 4:00 pm. You do not need an appointment for the interview!  [facility] [address], [place], [place]   [place]  [place] Department  [place]  [phone]    Behavioral Health Resources in the Community: Intensive Outpatient Programs Organization         Address  Phone  Notes  [place]. [address], [place], [place] [phone]   [facility]patient [address] [place], [place]   ADS: Alcohol & Drug Svcs [address], [place], [place]   [place] [address]  [place],  [place] [phone] or [phone]   Substance Abuse Resources Organization         Address  Phone  Notes  Alcohol and Drug Services  [place]  9808886757   [facility] [place]  [phone]   Chinita Pester  [phone]   Residential & Outpatient Substance Abuse Program  6176879740   Psychological Services Organization         Address  Phone  Notes  [facility] [place]  [place]  [phone]   [place] [address] [place] or [phone]    Mobile Crisis Teams Organization         Address  Phone  Notes  [facility], Mobile Crisis Care Unit  [phone]   [facility]  [address] [place], [place]   [place] [address], [place] [phone]    Self-Help/Support Groups Organization         Address  Phone             Notes  [place]. of [place] - variety of support groups  [place] Call for more information  Narcotics Anonymous (NA), [facility] [address], [employer] [place]  2 meetings at this location   Special educational needs teacher         Address  Phone  Notes  ASAP Residential Treatment [place],    [place]  [phone]   [facility]  [address], [place] [address]885, [place], [place]   Fabrica [place], [place] [phone] Admissions: 8am-3pm M-F  Incentives Substance [place] [address],    [place], [place] [phone]   [facility] [address] [place], [place], [place]   [facility] [address],  [place], [place]     [facility] Programs - Intensive Outpatient [address], [place], [place]   [facility] ([place] [address] [place]  [place], [place] 1-6025839464 or [phone]   [facility] Services (RTS) [address], [place], [place] Accepts Medicaid  [facility] [place] [address]  [place] [phone] Substance Abuse/Addiction Treatment   [facility] Organization         Address  Phone  Notes  [facility]  [phone]   Domenic Schwab, PhD [address] [place], [place]   [phone] or [phone]   [place], [place] [phone]   [facility] [address], [place], [place] [phone] Insurance/Medicaid/sponsorship through [facility] and Families [address] [place]                                    [place], [place] [phone] [place] [address] [place], [place] [phone]    Dr. Adele Schilder  [phone]   [facility] Dept. 1) [address] [place] 2) [place] 3)  [place] [address], [place] [phone]  [phone]   [place] [phone] or [phone] (After Hours)

## 2013-03-26 NOTE — ED Notes (Signed)
Pt reports not feeling well and n/v for several days, states "my pancreas is acting up." hx of pancreatitis. No acute distress noted at triage.

## 2013-03-26 NOTE — ED Notes (Signed)
Pt tolerated PO intake with no vomiting.

## 2013-03-26 NOTE — ED Provider Notes (Signed)
CSN: [id number]     Arrival date & time 03/26/13  1451 History   First MD Initiated Contact with Patient 03/26/13 1845     Chief Complaint  Patient presents with  . Emesis    (Consider location/radiation/quality/duration/timing/severity/associated sxs/prior Treatment) HPI Comments: Patient with remote history of pancreatitis presents with complaint of epigastric abdominal pain, nausea, vomiting, and occasional diarrhea for the past 5 days. No treatments at home. Patient cannot keep down solids or liquids. Vomiting is nonbloody, nonbilious. Diarrhea is nonbloody. He denies recent alcohol use. Denies heavy NSAID use. No history of abdominal surgeries. Review of previous records show no findings for pancreatitis on lab tests or CT. No history of abdominal surgery. The onset of this condition was acute. The course is constant. Aggravating factors: none. Alleviating factors: none.   Patient has also had dysuria and urethral discharge for the past 3 days. He is sexually active with one partner. He feels he is low risk for STD. No history of STD.   Patient is a 43 y.o. male presenting with vomiting. The history is provided by the patient.  Emesis Associated symptoms: abdominal pain and diarrhea   Associated symptoms: no headaches, no myalgias and no sore throat     Past Medical History  Diagnosis Date  . Asthma   . Pancreatitis    History reviewed. No pertinent past surgical history. History reviewed. No pertinent family history. History  Substance Use Topics  . Smoking status: Current Some Day Smoker    Types: Cigarettes  . Smokeless tobacco: Not on file  . Alcohol Use: No    Review of Systems  Constitutional: Negative for fever.  HENT: Negative for rhinorrhea and sore throat.   Eyes: Negative for redness.  Respiratory: Negative for cough.   Cardiovascular: Negative for chest pain.  Gastrointestinal: Positive for nausea, vomiting, abdominal pain and diarrhea.  Genitourinary:  Positive for dysuria and discharge. Negative for hematuria.  Musculoskeletal: Negative for myalgias.  Skin: Negative for rash.  Neurological: Negative for headaches.   Allergies  Aspirin and Ibuprofen  Home Medications   Current Outpatient Rx  Name  Route  Sig  Dispense  Refill  . oxyCODONE-acetaminophen (PERCOCET/ROXICET) 5-325 MG per tablet   Oral   Take 1 tablet by mouth every 4 (four) hours as needed for severe pain (pain).          BP 111/77  Pulse 65  Temp(Src) 97.6 F (36.4 C) (Oral)  Resp 20  Ht 6\' 2"  (1.88 m)  Wt 184 lb (83.462 kg)  BMI 23.61 kg/m2  SpO2 100%  Physical Exam  Nursing note and vitals reviewed. Constitutional: He appears well-developed and well-nourished.  HENT:  Head: Normocephalic and atraumatic.  Eyes: Conjunctivae are normal. Right eye exhibits no discharge. Left eye exhibits no discharge.  Neck: Normal range of motion. Neck supple.  Cardiovascular: Normal rate, regular rhythm and normal heart sounds.   Pulmonary/Chest: Effort normal and breath sounds normal.  Abdominal: Soft. Bowel sounds are normal. There is tenderness in the right upper quadrant, epigastric area and left upper quadrant. There is no rebound and no guarding.  Genitourinary: Testes normal. Discharge (white) found.  Neurological: He is alert.  Skin: Skin is warm and dry.  Psychiatric: He has a normal mood and affect.    ED Course  Procedures (including critical care time) Labs Review Labs Reviewed  CBC WITH DIFFERENTIAL - Abnormal; Notable for the following:    Neutrophils Relative % 29 (*)    Neutro Abs 1.2 (*)  Lymphocytes Relative 60 (*)    All other components within normal limits  COMPREHENSIVE METABOLIC PANEL - Abnormal; Notable for the following:    Sodium 133 (*)    Albumin 3.4 (*)    All other components within normal limits  GC/CHLAMYDIA PROBE AMP  LIPASE, BLOOD   Imaging Review No results found.  EKG Interpretation   None      7:45 PM Patient  seen and examined. Work-up initiated. Medications ordered.   Vital signs reviewed and are as follows: Filed Vitals:   03/26/13 1723  BP: 111/77  Pulse: 65  Temp:   Resp: 20   8:36 PM Pt improved. Tolerating PO's. He is comfortable with d/c to home with pain medication. Urged PCP f/u. Referrals given.   Patient counseled on use of narcotic pain medications. Counseled not to combine these medications with others containing tylenol. Urged not to drink alcohol, drive, or perform any other activities that requires focus while taking these medications. The patient verbalizes understanding and agrees with the plan.  The patient was urged to return to the Emergency Department immediately with worsening of current symptoms, worsening abdominal pain, persistent vomiting, blood noted in stools, fever, or any other concerns. The patient verbalized understanding.   Patient counseled on safe sexual practices.  Told them that they should not have sexual contact for next 7 days and that they need to inform sexual partners so that they can get tested and treated as well.  Urged f/u with [facility] for HIV and syphilis testing.  Patient verbalizes understanding and agrees with plan.     MDM   Final diagnoses:  Nausea and vomiting  Urethritis  Abdominal pain   Patient with symptoms consistent with gastritis. Vitals are stable, no fever. No signs of dehydration, now tolerating PO's. Lungs are clear. No focal abdominal pain, no concern for appendicitis, cholecystitis, pancreatitis, ruptured viscus, UTI, kidney stone, or any other abdominal etiology.  Supportive therapy indicated with return if symptoms worsen. Patient counseled.  Urethritis: clinically apparent, treated for GC/chlamydia      Carlisle Cater, PA-C 03/26/13 2039

## 2013-03-27 LAB — GC/CHLAMYDIA PROBE AMP
CT Probe RNA: NEGATIVE
GC Probe RNA: POSITIVE — AB

## 2013-03-27 NOTE — ED Provider Notes (Signed)
Medical screening examination/treatment/procedure(s) were performed by non-physician practitioner and as supervising physician I was immediately available for consultation/collaboration.  EKG Interpretation   None         Charles B. Karle Starch, MD 03/27/13 [phone]

## 2013-03-28 ENCOUNTER — Telehealth (HOSPITAL_COMMUNITY): Payer: Self-pay | Admitting: Emergency Medicine

## 2013-03-28 NOTE — ED Notes (Signed)
+  Gonorrhea. Patient treated with Rocephin and Zithromax. DHHS faxed.

## 2013-03-30 NOTE — ED Notes (Signed)
Unable to contact via phone letter sent to EPIC address. 

## 2013-06-25 ENCOUNTER — Emergency Department (HOSPITAL_COMMUNITY)
Admission: EM | Admit: 2013-06-25 | Discharge: 2013-06-26 | Disposition: A | Discharge status: Home / Self Care | Payer: Self-pay | Attending: Emergency Medicine | Admitting: Emergency Medicine

## 2013-06-25 ENCOUNTER — Encounter (HOSPITAL_COMMUNITY): Payer: Self-pay | Admitting: Emergency Medicine

## 2013-06-25 DIAGNOSIS — R112 Nausea with vomiting, unspecified: Secondary | ICD-10-CM | POA: Insufficient documentation

## 2013-06-25 DIAGNOSIS — Z8719 Personal history of other diseases of the digestive system: Secondary | ICD-10-CM | POA: Insufficient documentation

## 2013-06-25 DIAGNOSIS — R197 Diarrhea, unspecified: Secondary | ICD-10-CM | POA: Insufficient documentation

## 2013-06-25 DIAGNOSIS — J45909 Unspecified asthma, uncomplicated: Secondary | ICD-10-CM | POA: Insufficient documentation

## 2013-06-25 DIAGNOSIS — R1030 Lower abdominal pain, unspecified: Secondary | ICD-10-CM

## 2013-06-25 DIAGNOSIS — R1031 Right lower quadrant pain: Secondary | ICD-10-CM | POA: Insufficient documentation

## 2013-06-25 DIAGNOSIS — F17200 Nicotine dependence, unspecified, uncomplicated: Secondary | ICD-10-CM | POA: Insufficient documentation

## 2013-06-25 LAB — CBC WITH DIFFERENTIAL/PLATELET
Basophils Absolute: 0 10*3/uL (ref 0.0–0.1)
Basophils Relative: 0 % (ref 0–1)
Eosinophils Absolute: 0.1 10*3/uL (ref 0.0–0.7)
Eosinophils Relative: 1 % (ref 0–5)
HCT: 40.1 % (ref 39.0–52.0)
Hemoglobin: 13.7 g/dL (ref 13.0–17.0)
Lymphocytes Relative: 60 % — ABNORMAL HIGH (ref 12–46)
Lymphs Abs: 2.8 10*3/uL (ref 0.7–4.0)
MCH: 31.3 pg (ref 26.0–34.0)
MCHC: 34.2 g/dL (ref 30.0–36.0)
MCV: 91.6 fL (ref 78.0–100.0)
Monocytes Absolute: 0.5 10*3/uL (ref 0.1–1.0)
Monocytes Relative: 11 % (ref 3–12)
Neutro Abs: 1.3 10*3/uL — ABNORMAL LOW (ref 1.7–7.7)
Neutrophils Relative %: 28 % — ABNORMAL LOW (ref 43–77)
Platelets: 174 10*3/uL (ref 150–400)
RBC: 4.38 MIL/uL (ref 4.22–5.81)
RDW: 14.2 % (ref 11.5–15.5)
WBC: 4.6 10*3/uL (ref 4.0–10.5)

## 2013-06-25 LAB — URINALYSIS, ROUTINE W REFLEX MICROSCOPIC
Glucose, UA: NEGATIVE mg/dL
Hgb urine dipstick: NEGATIVE
Ketones, ur: NEGATIVE mg/dL
Leukocytes, UA: NEGATIVE
Nitrite: NEGATIVE
Protein, ur: NEGATIVE mg/dL
Specific Gravity, Urine: 1.028 (ref 1.005–1.030)
Urobilinogen, UA: 1 mg/dL (ref 0.0–1.0)
pH: 6.5 (ref 5.0–8.0)

## 2013-06-25 LAB — COMPREHENSIVE METABOLIC PANEL
ALT: 17 U/L (ref 0–53)
AST: 20 U/L (ref 0–37)
Albumin: 3.6 g/dL (ref 3.5–5.2)
Alkaline Phosphatase: 53 U/L (ref 39–117)
BUN: 10 mg/dL (ref 6–23)
CO2: 28 mEq/L (ref 19–32)
Calcium: 9.1 mg/dL (ref 8.4–10.5)
Chloride: 100 mEq/L (ref 96–112)
Creatinine, Ser: 1.02 mg/dL (ref 0.50–1.35)
GFR calc Af Amer: >90 mL/min (ref >90)
GFR calc non Af Amer: 88 mL/min — ABNORMAL LOW (ref >90)
Glucose, Bld: 97 mg/dL (ref 70–99)
Potassium: 4 mEq/L (ref 3.7–5.3)
Sodium: 136 mEq/L — ABNORMAL LOW (ref 137–147)
Total Bilirubin: 0.3 mg/dL (ref 0.3–1.2)
Total Protein: 6.8 g/dL (ref 6.0–8.3)

## 2013-06-25 LAB — LIPASE, BLOOD: Lipase: 20 U/L (ref 11–59)

## 2013-06-25 NOTE — ED Notes (Signed)
Pt c/o abdominal pain, n/v/d for two days.

## 2013-06-26 ENCOUNTER — Emergency Department (HOSPITAL_COMMUNITY): Payer: Medicaid Other

## 2013-06-26 MED ORDER — ONDANSETRON 8 MG PO TBDP: 8.0000 mg | ORAL_TABLET | Freq: Three times a day (TID) | ORAL | Status: DC | PRN

## 2013-06-26 MED ORDER — SODIUM CHLORIDE 0.9 % IV BOLUS (SEPSIS)
1000.0000 mL | Freq: Once | INTRAVENOUS | Status: AC
  Administered 2013-06-26: 1000 mL via INTRAVENOUS

## 2013-06-26 MED ORDER — DICYCLOMINE HCL 20 MG PO TABS: 20.0000 mg | ORAL_TABLET | Freq: Four times a day (QID) | ORAL | Status: DC | PRN

## 2013-06-26 MED ORDER — IOHEXOL 300 MG/ML  SOLN
100.0000 mL | Freq: Once | INTRAMUSCULAR | Status: AC | PRN
  Administered 2013-06-26: 100 mL via INTRAVENOUS

## 2013-06-26 MED ORDER — ONDANSETRON HCL 4 MG/2ML IJ SOLN
4.0000 mg | Freq: Once | INTRAMUSCULAR | Status: AC
  Administered 2013-06-26: 4 mg via INTRAVENOUS
  Filled 2013-06-26: qty 2

## 2013-06-26 MED ORDER — IOHEXOL 300 MG/ML  SOLN
25.0000 mL | INTRAMUSCULAR | Status: AC
  Administered 2013-06-26: 25 mL via ORAL

## 2013-06-26 MED ORDER — MORPHINE SULFATE 4 MG/ML IJ SOLN
4.0000 mg | Freq: Once | INTRAMUSCULAR | Status: AC
  Administered 2013-06-26: 4 mg via INTRAVENOUS
  Filled 2013-06-26: qty 1

## 2013-06-26 NOTE — ED Notes (Signed)
Pt ambulated to restroom. 

## 2013-06-26 NOTE — Discharge Instructions (Signed)
Abdominal Pain, Adult Many things can cause abdominal pain. Usually, abdominal pain is not caused by a disease and will improve without treatment. It can often be observed and treated at home. Your health care provider will do a physical exam and possibly order blood tests and X-rays to help determine the seriousness of your pain. However, in many cases, more time must pass before a clear cause of the pain can be found. Before that point, your health care provider may not know if you need more testing or further treatment. HOME CARE INSTRUCTIONS  Monitor your abdominal pain for any changes. The following actions may help to alleviate any discomfort you are experiencing:  Only take over-the-counter or prescription medicines as directed by your health care provider.  Do not take laxatives unless directed to do so by your health care provider.  Try a clear liquid diet (broth, tea, or water) as directed by your health care provider. Slowly move to a bland diet as tolerated. SEEK MEDICAL CARE IF:  You have unexplained abdominal pain.  You have abdominal pain associated with nausea or diarrhea.  You have pain when you urinate or have a bowel movement.  You experience abdominal pain that wakes you in the night.  You have abdominal pain that is worsened or improved by eating food.  You have abdominal pain that is worsened with eating fatty foods. SEEK IMMEDIATE MEDICAL CARE IF:   Your pain does not go away within 2 hours.  You have a fever.  You keep throwing up (vomiting).  Your pain is felt only in portions of the abdomen, such as the right side or the left lower portion of the abdomen.  You pass bloody or black tarry stools. MAKE SURE YOU:  Understand these instructions.   Will watch your condition.   Will get help right away if you are not doing well or get worse.  Document Released: 11/08/2004 Document Revised: 11/19/2012 Document Reviewed: 10/08/2012 [facility] Patient  Information 2014 [place].  Diarrhea Diarrhea is watery poop (stool). It can make you feel weak, tired, thirsty, or give you a dry mouth (signs of dehydration). Watery poop is a sign of another problem, most often an infection. It often lasts 2 3 days. It can last longer if it is a sign of something serious. Take care of yourself as told by your doctor. HOME CARE   Drink 1 cup (8 ounces) of fluid each time you have watery poop.  Do not drink the following fluids:  Those that contain simple sugars (fructose, glucose, galactose, lactose, sucrose, maltose).  Sports drinks.  Fruit juices.  Whole milk products.  Sodas.  Drinks with caffeine (coffee, tea, soda) or alcohol.  Oral rehydration solution may be used if the doctor says it is okay. You may make your own solution. Follow this recipe:    teaspoon table salt.   teaspoon baking soda.   teaspoon salt substitute containing potassium chloride.  1 tablespoons sugar.  1 liter (34 ounces) of water.  Avoid the following foods:  High fiber foods, such as raw fruits and vegetables.  Nuts, seeds, and whole grain breads and cereals.   Those that are sweetened with sugar alcohols (xylitol, sorbitol, mannitol).  Try eating the following foods:  Starchy foods, such as rice, toast, pasta, low-sugar cereal, oatmeal, baked potatoes, crackers, and bagels.  Bananas.  Applesauce.  Eat probiotic-rich foods, such as yogurt and milk products that are fermented.  Wash your hands well after each time you have  watery poop.  Only take medicine as told by your doctor.  Take a warm bath to help lessen burning or pain from having watery poop. GET HELP RIGHT AWAY IF:   You cannot drink fluids without throwing up (vomiting).  You keep throwing up.  You have blood in your poop, or your poop looks black and tarry.  You do not pee (urinate) in 6 8 hours, or there is only a small amount of very dark pee.  You have belly  (abdominal) pain that gets worse or stays in the same spot (localizes).  You are weak, dizzy, confused, or lightheaded.  You have a very bad headache.  Your watery poop gets worse or does not get better.  You have a fever or lasting symptoms for more than 2 3 days.  You have a fever and your symptoms suddenly get worse. MAKE SURE YOU:   Understand these instructions.  Will watch your condition.  Will get help right away if you are not doing well or get worse. Document Released: 07/18/2007 Document Revised: 10/24/2011 Document Reviewed: 10/07/2011 [facility] Patient Information 2014 [place], [place].  Diet for Diarrhea, Adult Frequent, runny stools (diarrhea) may be caused or worsened by food or drink. Diarrhea may be relieved by changing your diet. Since diarrhea can last up to 7 days, it is easy for you to lose too much fluid from the body and become dehydrated. Fluids that are lost need to be replaced. Along with a modified diet, make sure you drink enough fluids to keep your urine clear or pale yellow. DIET INSTRUCTIONS  Ensure adequate fluid intake (hydration): have 1 cup (8 oz) of fluid for each diarrhea episode. Avoid fluids that contain simple sugars or sports drinks, fruit juices, whole milk products, and sodas. Your urine should be clear or pale yellow if you are drinking enough fluids. Hydrate with an oral rehydration solution that you can purchase at pharmacies, retail stores, and online. You can prepare an oral rehydration solution at home by mixing the following ingredients together:    tsp table salt.   tsp baking soda.   tsp salt substitute containing potassium chloride.  1  tablespoons sugar.  1 L (34 oz) of water.  Certain foods and beverages may increase the speed at which food moves through the gastrointestinal (GI) tract. These foods and beverages should be avoided and include:  Caffeinated and alcoholic beverages.  High-fiber foods, such as raw fruits and  vegetables, nuts, seeds, and whole grain breads and cereals.  Foods and beverages sweetened with sugar alcohols, such as xylitol, sorbitol, and mannitol.  Some foods may be well tolerated and may help thicken stool including:  Starchy foods, such as rice, toast, pasta, low-sugar cereal, oatmeal, grits, baked potatoes, crackers, and bagels.   Bananas.   Applesauce.  Add probiotic-rich foods to help increase healthy bacteria in the GI tract, such as yogurt and fermented milk products. RECOMMENDED FOODS AND BEVERAGES Starches Choose foods with less than 2 g of fiber per serving.  Recommended:  White, [place], and pita breads, plain rolls, buns, bagels. Plain muffins, matzo. Soda, saltine, or graham crackers. Pretzels, melba toast, zwieback. Cooked cereals made with water: cornmeal, farina, cream cereals. Dry cereals: refined corn, wheat, rice. Potatoes prepared any way without skins, refined macaroni, spaghetti, noodles, refined rice.  Avoid:  Bread, rolls, or crackers made with whole wheat, multi-grains, rye, bran seeds, nuts, or coconut. Corn tortillas or taco shells. Cereals containing whole grains, multi-grains, bran, coconut, nuts, raisins. Cooked  or dry oatmeal. Coarse wheat cereals, granola. Cereals advertised as "high-fiber." Potato skins. Whole grain pasta, wild or brown rice. Popcorn. Sweet potatoes, yams. Sweet rolls, doughnuts, waffles, pancakes, sweet breads. Vegetables  Recommended: Strained tomato and vegetable juices. Most well-cooked and canned vegetables without seeds. Fresh: Tender lettuce, cucumber without the skin, cabbage, spinach, bean sprouts.  Avoid: Fresh, cooked, or canned: Artichokes, baked beans, beet greens, broccoli, Brussels sprouts, corn, kale, legumes, peas, sweet potatoes. Cooked: Green or red cabbage, spinach. Avoid large servings of any vegetables because vegetables shrink when cooked, and they contain more fiber per serving than fresh  vegetables. Fruit  Recommended: Cooked or canned: Apricots, applesauce, cantaloupe, cherries, fruit cocktail, grapefruit, grapes, kiwi, mandarin oranges, peaches, pears, plums, watermelon. Fresh: Apples without skin, ripe banana, grapes, cantaloupe, cherries, grapefruit, peaches, oranges, plums. Keep servings limited to  cup or 1 piece.  Avoid: Fresh: Apples with skin, apricots, mangoes, pears, raspberries, strawberries. Prune juice, stewed or dried prunes. Dried fruits, raisins, dates. Large servings of all fresh fruits. Protein  Recommended: Ground or well-cooked tender beef, ham, veal, lamb, pork, or poultry. Eggs. Fish, oysters, shrimp, lobster, other seafoods. Liver, organ meats.  Avoid: Tough, fibrous meats with gristle. Peanut butter, smooth or chunky. Cheese, nuts, seeds, legumes, dried peas, beans, lentils. Dairy  Recommended: Yogurt, lactose-free milk, kefir, drinkable yogurt, buttermilk, soy milk, or plain hard cheese.  Avoid: Milk, chocolate milk, beverages made with milk, such as milkshakes. Soups  Recommended: Bouillon, broth, or soups made from allowed foods. Any strained soup.  Avoid: Soups made from vegetables that are not allowed, cream or milk-based soups. Desserts and Sweets  Recommended: Sugar-free gelatin, sugar-free frozen ice pops made without sugar alcohol.  Avoid: Plain cakes and cookies, pie made with fruit, pudding, custard, cream pie. Gelatin, fruit, ice, sherbet, frozen ice pops. Ice cream, ice milk without nuts. Plain hard candy, honey, jelly, molasses, syrup, sugar, chocolate syrup, gumdrops, marshmallows. Fats and Oils  Recommended: Limit fats to less than 8 tsp per day.  Avoid: Seeds, nuts, olives, avocados. Margarine, butter, cream, mayonnaise, salad oils, plain salad dressings. Plain gravy, crisp bacon without rind. Beverages  Recommended: Water, decaffeinated teas, oral rehydration solutions, sugar-free beverages not sweetened with sugar  alcohols.  Avoid: Fruit juices, caffeinated beverages (coffee, tea, soda), alcohol, sports drinks, or lemon-lime soda. Condiments  Recommended: Ketchup, mustard, horseradish, vinegar, cocoa powder. Spices in moderation: allspice, basil, bay leaves, celery powder or leaves, cinnamon, cumin powder, curry powder, ginger, mace, marjoram, onion or garlic powder, oregano, paprika, parsley flakes, ground pepper, rosemary, sage, savory, tarragon, thyme, turmeric.  Avoid: Coconut, honey. Document Released: 04/21/2003 Document Revised: 10/24/2011 Document Reviewed: 06/15/2011 [facility] Patient Information 2014 [place].  Nausea and Vomiting Nausea means you feel sick to your stomach. Throwing up (vomiting) is a reflex where stomach contents come out of your mouth. HOME CARE   Take medicine as told by your doctor.  Do not force yourself to eat. However, you do need to drink fluids.  If you feel like eating, eat a normal diet as told by your doctor.  Eat rice, wheat, potatoes, bread, lean meats, yogurt, fruits, and vegetables.  Avoid high-fat foods.  Drink enough fluids to keep your pee (urine) clear or pale yellow.  Ask your doctor how to replace body fluid losses (rehydrate). Signs of body fluid loss (dehydration) include:  Feeling very thirsty.  Dry lips and mouth.  Feeling dizzy.  Dark pee.  Peeing less than normal.  Feeling confused.  Fast breathing or heart  rate. GET HELP RIGHT AWAY IF:   You have blood in your throw up.  You have black or bloody poop (stool).  You have a bad headache or stiff neck.  You feel confused.  You have bad belly (abdominal) pain.  You have chest pain or trouble breathing.  You do not pee at least once every 8 hours.  You have cold, clammy skin.  You keep throwing up after 24 to 48 hours.  You have a fever. MAKE SURE YOU:   Understand these instructions.  Will watch your condition.  Will get help right away if you are not  doing well or get worse. Document Released: 07/18/2007 Document Revised: 04/23/2011 Document Reviewed: 06/30/2010 [facility] Patient Information 2014 [place], [place].

## 2013-06-26 NOTE — ED Provider Notes (Signed)
CSN: [id number]     Arrival date & time 06/25/13  2013 History   First MD Initiated Contact with Patient 06/26/13 0143     Chief Complaint  Patient presents with  . Abdominal Pain  . Emesis  . Diarrhea     (Consider location/radiation/quality/duration/timing/severity/associated sxs/prior Treatment) HPI 43 year old male presents to emergency room with complaint of lower abdominal pain, nausea vomiting and diarrhea ongoing for last 2 days.  No sick contacts, no unusual foods.  No blood in stool or emesis.  No fevers or chills.  Patient reports he has history of pancreatitis, but this pain is different and lower than his pancreatitis pain.  Pain mainly over right lower quadrant. Past Medical History  Diagnosis Date  . Asthma   . Pancreatitis    History reviewed. No pertinent past surgical history. History reviewed. No pertinent family history. History  Substance Use Topics  . Smoking status: Current Some Day Smoker    Types: Cigarettes  . Smokeless tobacco: Not on file  . Alcohol Use: No    Review of Systems   See History of Present Illness; otherwise all other systems are reviewed and negative  Allergies  Aspirin and Ibuprofen  Home Medications   Prior to Admission medications   Medication Sig Start Date End Date Taking? Authorizing Provider  HYDROcodone-acetaminophen (NORCO) 7.5-325 MG per tablet Take 1 tablet by mouth every 6 (six) hours as needed for moderate pain.   Yes Historical Provider, MD  dicyclomine (BENTYL) 20 MG tablet Take 1 tablet (20 mg total) by mouth every 6 (six) hours as needed for spasms (for abdominal cramping). 06/26/13   Kalman Drape, MD  ondansetron (ZOFRAN ODT) 8 MG disintegrating tablet Take 1 tablet (8 mg total) by mouth every 8 (eight) hours as needed for nausea or vomiting. 06/26/13   Kalman Drape, MD   BP 110/79  Pulse 87  Temp(Src) 98.3 F (36.8 C) (Oral)  Resp 20  Ht 6\' 2"  (1.88 m)  Wt 176 lb (79.833 kg)  BMI 22.59 kg/m2  SpO2  100% Physical Exam  Nursing note and vitals reviewed. Constitutional: He is oriented to person, place, and time. He appears well-developed and well-nourished.  HENT:  Head: Normocephalic and atraumatic.  Nose: Nose normal.  Mouth/Throat: Oropharynx is clear and moist.  Eyes: Conjunctivae and EOM are normal. Pupils are equal, round, and reactive to light.  Neck: Normal range of motion. Neck supple. No JVD present. No tracheal deviation present. No thyromegaly present.  Cardiovascular: Normal rate, regular rhythm, normal heart sounds and intact distal pulses.  Exam reveals no gallop and no friction rub.   No murmur heard. Pulmonary/Chest: Effort normal and breath sounds normal. No stridor. No respiratory distress. He has no wheezes. He has no rales. He exhibits no tenderness.  Abdominal: Soft. Bowel sounds are normal. He exhibits no distension and no mass. There is tenderness (patient is tender across the lower abdomen worse in right lower cautery and suprapubic.  He has mild rebound.). There is rebound. There is no guarding.  Musculoskeletal: Normal range of motion. He exhibits no edema and no tenderness.  Lymphadenopathy:    He has no cervical adenopathy.  Neurological: He is alert and oriented to person, place, and time. He exhibits normal muscle tone. Coordination normal.  Skin: Skin is warm and dry. No rash noted. No erythema. No pallor.  Psychiatric: He has a normal mood and affect. His behavior is normal. Judgment and thought content normal.    ED Course  Procedures (including critical care time) Labs Review Labs Reviewed  CBC WITH DIFFERENTIAL - Abnormal; Notable for the following:    Neutrophils Relative % 28 (*)    Neutro Abs 1.3 (*)    Lymphocytes Relative 60 (*)    All other components within normal limits  COMPREHENSIVE METABOLIC PANEL - Abnormal; Notable for the following:    Sodium 136 (*)    GFR calc non Af Amer 88 (*)    All other components within normal limits   URINALYSIS, ROUTINE W REFLEX MICROSCOPIC - Abnormal; Notable for the following:    Bilirubin Urine SMALL (*)    All other components within normal limits  LIPASE, BLOOD    Imaging Review Ct Abdomen Pelvis W Contrast  06/26/2013   CLINICAL DATA:  Abdominal pain, nausea, vomiting, diarrhea for 2 days.  EXAM: CT ABDOMEN AND PELVIS WITH CONTRAST  TECHNIQUE: Multidetector CT imaging of the abdomen and pelvis was performed using the standard protocol following bolus administration of intravenous contrast.  CONTRAST:  150mL OMNIPAQUE IOHEXOL 300 MG/ML  SOLN  COMPARISON:  DG ABD ACUTE W/CHEST dated 12/29/2011; CT ABD/PELVIS W CM dated 12/05/2010  FINDINGS: Small focal patchy infiltrate in the right lung base posteriorly.  The liver, spleen, gallbladder, pancreas, adrenal glands, kidneys, abdominal aorta, inferior vena cava, and retroperitoneal lymph nodes are unremarkable. Mesenteric lymph nodes are not pathologically enlarged. Stomach, small bowel, and colon are unremarkable for degree of distention. No free air or free fluid in the abdomen. Abdominal wall musculature appears intact.  Pelvis: The appendix appears to be coiled under the cecum and is normal. Rectosigmoid colon is stool filled without distention. Bladder is decompressed. Prostate gland is not enlarged. Calcified phleboliths in the pelvis. No significant lymphadenopathy. No free or loculated pelvic fluid collections. Degenerative changes of the lumbosacral interspace. No destructive bone lesions.  IMPRESSION: No acute process demonstrated in the abdomen or pelvis.   Electronically Signed   By: Lucienne Capers M.D.   On: 06/26/2013 06:20     EKG Interpretation None      MDM   Final diagnoses:  Abdominal pain, lower  Nausea vomiting and diarrhea    43 year old male with lower abdominal pain, nausea vomiting and diarrhea.  He has significant tenderness in his right lower quadrant no prior history of appendectomy.  Labs are unremarkable.   CT scan does not show any acute process.  He is feeling better and is tolerated oral contrast.  Plan to send home with Zofran and Bentyl.    Kalman Drape, MD 06/26/13 [phone]

## 2013-06-26 NOTE — ED Notes (Signed)
Patient transported to CT 

## 2013-09-08 ENCOUNTER — Encounter (HOSPITAL_COMMUNITY): Payer: Self-pay | Admitting: Emergency Medicine

## 2013-09-08 DIAGNOSIS — J45909 Unspecified asthma, uncomplicated: Secondary | ICD-10-CM | POA: Insufficient documentation

## 2013-09-08 DIAGNOSIS — F17200 Nicotine dependence, unspecified, uncomplicated: Secondary | ICD-10-CM | POA: Insufficient documentation

## 2013-09-08 DIAGNOSIS — R109 Unspecified abdominal pain: Secondary | ICD-10-CM | POA: Insufficient documentation

## 2013-09-08 DIAGNOSIS — M549 Dorsalgia, unspecified: Secondary | ICD-10-CM | POA: Insufficient documentation

## 2013-09-08 LAB — COMPREHENSIVE METABOLIC PANEL
ALT: 13 U/L (ref 0–53)
AST: 18 U/L (ref 0–37)
Albumin: 3.6 g/dL (ref 3.5–5.2)
Alkaline Phosphatase: 44 U/L (ref 39–117)
Anion gap: 10 (ref 5–15)
BUN: 9 mg/dL (ref 6–23)
CO2: 26 mEq/L (ref 19–32)
Calcium: 8.5 mg/dL (ref 8.4–10.5)
Chloride: 109 mEq/L (ref 96–112)
Creatinine, Ser: 1.07 mg/dL (ref 0.50–1.35)
GFR calc Af Amer: >90 mL/min (ref >90)
GFR calc non Af Amer: 82 mL/min — ABNORMAL LOW (ref >90)
Glucose, Bld: 92 mg/dL (ref 70–99)
Potassium: 4.8 mEq/L (ref 3.7–5.3)
Sodium: 145 mEq/L (ref 137–147)
Total Bilirubin: 0.3 mg/dL (ref 0.3–1.2)
Total Protein: 6.5 g/dL (ref 6.0–8.3)

## 2013-09-08 LAB — CBC WITH DIFFERENTIAL/PLATELET
Basophils Absolute: 0 10*3/uL (ref 0.0–0.1)
Basophils Relative: 0 % (ref 0–1)
Eosinophils Absolute: 0.1 10*3/uL (ref 0.0–0.7)
Eosinophils Relative: 1 % (ref 0–5)
HCT: 39 % (ref 39.0–52.0)
Hemoglobin: 13.1 g/dL (ref 13.0–17.0)
Lymphocytes Relative: 62 % — ABNORMAL HIGH (ref 12–46)
Lymphs Abs: 2.9 10*3/uL (ref 0.7–4.0)
MCH: 30.6 pg (ref 26.0–34.0)
MCHC: 33.6 g/dL (ref 30.0–36.0)
MCV: 91.1 fL (ref 78.0–100.0)
Monocytes Absolute: 0.5 10*3/uL (ref 0.1–1.0)
Monocytes Relative: 10 % (ref 3–12)
Neutro Abs: 1.2 10*3/uL — ABNORMAL LOW (ref 1.7–7.7)
Neutrophils Relative %: 27 % — ABNORMAL LOW (ref 43–77)
Platelets: 169 10*3/uL (ref 150–400)
RBC: 4.28 MIL/uL (ref 4.22–5.81)
RDW: 14.2 % (ref 11.5–15.5)
WBC: 4.6 10*3/uL (ref 4.0–10.5)

## 2013-09-08 LAB — LIPASE, BLOOD: Lipase: 20 U/L (ref 11–59)

## 2013-09-08 NOTE — ED Notes (Signed)
Patient presents he has been upper abd pain (panreatitis) and now the pain is in his back also  Has been out of his meds for months

## 2013-09-09 ENCOUNTER — Emergency Department (HOSPITAL_COMMUNITY)
Admission: EM | Admit: 2013-09-09 | Discharge: 2013-09-09 | Discharge status: Against Medical Advice | Payer: Medicaid Other | Attending: Emergency Medicine | Admitting: Emergency Medicine

## 2013-10-17 ENCOUNTER — Encounter (HOSPITAL_COMMUNITY): Payer: Self-pay | Admitting: Emergency Medicine

## 2013-10-17 ENCOUNTER — Emergency Department (HOSPITAL_COMMUNITY)
Admission: EM | Admit: 2013-10-17 | Discharge: 2013-10-18 | Disposition: A | Discharge status: Home / Self Care | Payer: Medicaid Other | Attending: Emergency Medicine | Admitting: Emergency Medicine

## 2013-10-17 DIAGNOSIS — R1013 Epigastric pain: Secondary | ICD-10-CM | POA: Insufficient documentation

## 2013-10-17 DIAGNOSIS — Z79899 Other long term (current) drug therapy: Secondary | ICD-10-CM | POA: Insufficient documentation

## 2013-10-17 DIAGNOSIS — J45909 Unspecified asthma, uncomplicated: Secondary | ICD-10-CM | POA: Insufficient documentation

## 2013-10-17 DIAGNOSIS — F17200 Nicotine dependence, unspecified, uncomplicated: Secondary | ICD-10-CM | POA: Insufficient documentation

## 2013-10-17 DIAGNOSIS — G8929 Other chronic pain: Secondary | ICD-10-CM | POA: Insufficient documentation

## 2013-10-17 DIAGNOSIS — Z8719 Personal history of other diseases of the digestive system: Secondary | ICD-10-CM | POA: Insufficient documentation

## 2013-10-17 LAB — CBC WITH DIFFERENTIAL/PLATELET
Basophils Absolute: 0 10*3/uL (ref 0.0–0.1)
Basophils Relative: 0 % (ref 0–1)
Eosinophils Absolute: 0.1 10*3/uL (ref 0.0–0.7)
Eosinophils Relative: 2 % (ref 0–5)
HCT: 38.7 % — ABNORMAL LOW (ref 39.0–52.0)
Hemoglobin: 13.3 g/dL (ref 13.0–17.0)
Lymphocytes Relative: 52 % — ABNORMAL HIGH (ref 12–46)
Lymphs Abs: 2.9 10*3/uL (ref 0.7–4.0)
MCH: 31 pg (ref 26.0–34.0)
MCHC: 34.4 g/dL (ref 30.0–36.0)
MCV: 90.2 fL (ref 78.0–100.0)
Monocytes Absolute: 0.5 10*3/uL (ref 0.1–1.0)
Monocytes Relative: 8 % (ref 3–12)
Neutro Abs: 2.2 10*3/uL (ref 1.7–7.7)
Neutrophils Relative %: 38 % — ABNORMAL LOW (ref 43–77)
Platelets: 170 10*3/uL (ref 150–400)
RBC: 4.29 MIL/uL (ref 4.22–5.81)
RDW: 14.8 % (ref 11.5–15.5)
WBC: 5.7 10*3/uL (ref 4.0–10.5)

## 2013-10-17 LAB — COMPREHENSIVE METABOLIC PANEL
ALT: 18 U/L (ref 0–53)
AST: 23 U/L (ref 0–37)
Albumin: 3.5 g/dL (ref 3.5–5.2)
Alkaline Phosphatase: 58 U/L (ref 39–117)
Anion gap: 13 (ref 5–15)
BUN: 15 mg/dL (ref 6–23)
CO2: 25 mEq/L (ref 19–32)
Calcium: 9.3 mg/dL (ref 8.4–10.5)
Chloride: 101 mEq/L (ref 96–112)
Creatinine, Ser: 0.96 mg/dL (ref 0.50–1.35)
GFR calc Af Amer: >90 mL/min (ref >90)
GFR calc non Af Amer: >90 mL/min (ref >90)
Glucose, Bld: 112 mg/dL — ABNORMAL HIGH (ref 70–99)
Potassium: 3.7 mEq/L (ref 3.7–5.3)
Sodium: 139 mEq/L (ref 137–147)
Total Bilirubin: <0.2 mg/dL — ABNORMAL LOW (ref 0.3–1.2)
Total Protein: 6.8 g/dL (ref 6.0–8.3)

## 2013-10-17 LAB — LIPASE, BLOOD: Lipase: 27 U/L (ref 11–59)

## 2013-10-17 MED ORDER — OXYCODONE-ACETAMINOPHEN 5-325 MG PO TABS: 1.0000 | ORAL_TABLET | ORAL | Status: DC | PRN

## 2013-10-17 MED ORDER — HYDROMORPHONE HCL PF 2 MG/ML IJ SOLN
2.0000 mg | Freq: Once | INTRAMUSCULAR | Status: AC
  Administered 2013-10-17: 2 mg via INTRAMUSCULAR
  Filled 2013-10-17: qty 1

## 2013-10-17 NOTE — ED Notes (Signed)
Pt states he is having problems with his pancreas and he cannot take the pain anymore  Pt states he has been having pain for about a month  Pt states he has had vomiting as well  Last time he vomited was this morning

## 2013-10-17 NOTE — Discharge Instructions (Signed)
Percocet as prescribed as needed for pain.  Return to the emergency department if you develop worsening pain, high fever, bloody stool, or other new and concerning symptoms.  Please obtain a primary Dr. for future refills of your pain medication.   Abdominal Pain Many things can cause abdominal pain. Usually, abdominal pain is not caused by a disease and will improve without treatment. It can often be observed and treated at home. Your health care provider will do a physical exam and possibly order blood tests and X-rays to help determine the seriousness of your pain. However, in many cases, more time must pass before a clear cause of the pain can be found. Before that point, your health care provider may not know if you need more testing or further treatment. HOME CARE INSTRUCTIONS  Monitor your abdominal pain for any changes. The following actions may help to alleviate any discomfort you are experiencing:  Only take over-the-counter or prescription medicines as directed by your health care provider.  Do not take laxatives unless directed to do so by your health care provider.  Try a clear liquid diet (broth, tea, or water) as directed by your health care provider. Slowly move to a bland diet as tolerated. SEEK MEDICAL CARE IF:  You have unexplained abdominal pain.  You have abdominal pain associated with nausea or diarrhea.  You have pain when you urinate or have a bowel movement.  You experience abdominal pain that wakes you in the night.  You have abdominal pain that is worsened or improved by eating food.  You have abdominal pain that is worsened with eating fatty foods.  You have a fever. SEEK IMMEDIATE MEDICAL CARE IF:   Your pain does not go away within 2 hours.  You keep throwing up (vomiting).  Your pain is felt only in portions of the abdomen, such as the right side or the left lower portion of the abdomen.  You pass bloody or black tarry stools. MAKE SURE  YOU:  Understand these instructions.   Will watch your condition.   Will get help right away if you are not doing well or get worse.  Document Released: 11/08/2004 Document Revised: 02/03/2013 Document Reviewed: 10/08/2012 [facility] Patient Information 2015 [place], [place]. This information is not intended to replace advice given to you by your health care provider. Make sure you discuss any questions you have with your health care provider.  Acute Pancreatitis Acute pancreatitis is a disease in which the pancreas becomes suddenly inflamed. The pancreas is a large gland located behind your stomach. The pancreas produces enzymes that help digest food. The pancreas also releases the hormones glucagon and insulin that help regulate blood sugar. Damage to the pancreas occurs when the digestive enzymes from the pancreas are activated and begin attacking the pancreas before being released into the intestine. Most acute attacks last a couple of days and can cause serious complications. Some people become dehydrated and develop low blood pressure. In severe cases, bleeding into the pancreas can lead to shock and can be life-threatening. The lungs, heart, and kidneys may fail. CAUSES  Pancreatitis can happen to anyone. In some cases, the cause is unknown. Most cases are caused by:  Alcohol abuse.  Gallstones. Other less common causes are:  Certain medicines.  Exposure to certain chemicals.  Infection.  Damage caused by an accident (trauma).  Abdominal surgery. SYMPTOMS   Pain in the upper abdomen that may radiate to the back.  Tenderness and swelling of the abdomen.  Nausea and vomiting. DIAGNOSIS  Your caregiver will perform a physical exam. Blood and stool tests may be done to confirm the diagnosis. Imaging tests may also be done, such as X-rays, CT scans, or an ultrasound of the abdomen. TREATMENT  Treatment usually requires a stay in the hospital. Treatment may include:  Pain  medicine.  Fluid replacement through an intravenous line (IV).  Placing a tube in the stomach to remove stomach contents and control vomiting.  Not eating for 3 or 4 days. This gives your pancreas a rest, because enzymes are not being produced that can cause further damage.  Antibiotic medicines if your condition is caused by an infection.  Surgery of the pancreas or gallbladder. HOME CARE INSTRUCTIONS   Follow the diet advised by your caregiver. This may involve avoiding alcohol and decreasing the amount of fat in your diet.  Eat smaller, more frequent meals. This reduces the amount of digestive juices the pancreas produces.  Drink enough fluids to keep your urine clear or pale yellow.  Only take over-the-counter or prescription medicines as directed by your caregiver.  Avoid drinking alcohol if it caused your condition.  Do not smoke.  Get plenty of rest.  Check your blood sugar at home as directed by your caregiver.  Keep all follow-up appointments as directed by your caregiver. SEEK MEDICAL CARE IF:   You do not recover as quickly as expected.  You develop new or worsening symptoms.  You have persistent pain, weakness, or nausea.  You recover and then have another episode of pain. SEEK IMMEDIATE MEDICAL CARE IF:   You are unable to eat or keep fluids down.  Your pain becomes severe.  You have a fever or persistent symptoms for more than 2 to 3 days.  You have a fever and your symptoms suddenly get worse.  Your skin or the white part of your eyes turn yellow (jaundice).  You develop vomiting.  You feel dizzy, or you faint.  Your blood sugar is high (over 300 mg/dL). MAKE SURE YOU:   Understand these instructions.  Will watch your condition.  Will get help right away if you are not doing well or get worse. Document Released: 01/29/2005 Document Revised: 07/31/2011 Document Reviewed: 05/10/2011 [facility] Patient Information 2015 [place], [place]. This  information is not intended to replace advice given to you by your health care provider. Make sure you discuss any questions you have with your health care provider.

## 2013-10-17 NOTE — ED Notes (Signed)
Per Tasha in lab, LIPASE, will be processed from blood sent down at 2250 hrs

## 2013-10-17 NOTE — ED Provider Notes (Signed)
CSN: [id number]     Arrival date & time 10/17/13  2219 History   First MD Initiated Contact with Patient 10/17/13 2311     Chief Complaint  Patient presents with  . Abdominal Pain     (Consider location/radiation/quality/duration/timing/severity/associated sxs/prior Treatment) HPI Comments: Patient is a 43 year old male with history of chronic abdominal pain he states is related to his pancreas. He has been on pain medication in the past but has been out of it for the past month as he has lost his primary Dr. due to the closure of his clinic. He denies fevers or chills. He feels nauseated but denies vomiting. He denies any diarrhea or bloody stool.  Patient is a 43 y.o. male presenting with abdominal pain. The history is provided by the patient.  Abdominal Pain Pain location:  Epigastric Pain quality: gnawing   Pain radiates to:  Does not radiate Pain severity:  Moderate Timing:  Constant Progression:  Unchanged Chronicity:  Chronic Relieved by:  Nothing Worsened by:  Nothing tried Ineffective treatments:  None tried   Past Medical History  Diagnosis Date  . Asthma   . Pancreatitis    History reviewed. No pertinent past surgical history. Family History  Problem Relation Age of Onset  . Stroke Mother   . Hypertension Mother   . Cancer Other   . Diabetes Other    History  Substance Use Topics  . Smoking status: Current Some Day Smoker    Types: Cigarettes  . Smokeless tobacco: Never Used  . Alcohol Use: No    Review of Systems  Gastrointestinal: Positive for abdominal pain.  All other systems reviewed and are negative.     Allergies  Aspirin and Ibuprofen  Home Medications   Prior to Admission medications   Medication Sig Start Date End Date Taking? Authorizing Provider  meclizine (ANTIVERT) 25 MG tablet Take 25 mg by mouth 3 (three) times daily as needed for nausea.   Yes Historical Provider, MD  Multiple Vitamin (MULTIVITAMIN WITH MINERALS) TABS tablet  Take 1 tablet by mouth daily.   Yes Historical Provider, MD   BP 117/67  Pulse 71  Temp(Src) 98.6 F (37 C) (Oral)  Resp 18  Ht 6\' 2"  (1.88 m)  Wt 179 lb (81.194 kg)  BMI 22.97 kg/m2  SpO2 100% Physical Exam  Nursing note and vitals reviewed. Constitutional: He is oriented to person, place, and time. He appears well-developed and well-nourished. No distress.  HENT:  Head: Normocephalic and atraumatic.  Mouth/Throat: Oropharynx is clear and moist.  Neck: Normal range of motion. Neck supple.  Cardiovascular: Normal rate, regular rhythm and normal heart sounds.   No murmur heard. Pulmonary/Chest: Effort normal and breath sounds normal. No respiratory distress. He has no wheezes.  Abdominal: Soft. Bowel sounds are normal. He exhibits no distension. There is tenderness. There is no rebound and no guarding.  There is tenderness to palpation in the epigastric region. There is no rebound and no guarding.  Musculoskeletal: Normal range of motion. He exhibits no edema.  Lymphadenopathy:    He has no cervical adenopathy.  Neurological: He is alert and oriented to person, place, and time.  Skin: Skin is warm and dry. He is not diaphoretic.    ED Course  Procedures (including critical care time) Labs Review Labs Reviewed  CBC WITH DIFFERENTIAL - Abnormal; Notable for the following:    HCT 38.7 (*)    Neutrophils Relative % 38 (*)    Lymphocytes Relative 52 (*)  All other components within normal limits  COMPREHENSIVE METABOLIC PANEL - Abnormal; Notable for the following:    Glucose, Bld 112 (*)    Total Bilirubin <0.2 (*)    All other components within normal limits  LIPASE, BLOOD    Imaging Review No results found.   EKG Interpretation None      MDM   Final diagnoses:  None    Patient with history of chronic pancreatitis. Workup today reveals no elevation of lipase and laboratory studies are otherwise unremarkable. He states that he usually takes pain medication for  this, however he is out of it. I have agreed to write him a small quantity of Percocet and he is to followup with gastroenterology and his primary Dr. for future refills.    Veryl Speak, MD 10/17/13 [phone]

## 2013-10-30 ENCOUNTER — Encounter (HOSPITAL_COMMUNITY): Payer: Self-pay | Admitting: Emergency Medicine

## 2013-10-30 ENCOUNTER — Emergency Department (HOSPITAL_COMMUNITY)
Admission: EM | Admit: 2013-10-30 | Discharge: 2013-10-31 | Disposition: A | Discharge status: Home / Self Care | Payer: Medicaid Other | Attending: Emergency Medicine | Admitting: Emergency Medicine

## 2013-10-30 DIAGNOSIS — Y9389 Activity, other specified: Secondary | ICD-10-CM | POA: Insufficient documentation

## 2013-10-30 DIAGNOSIS — J45909 Unspecified asthma, uncomplicated: Secondary | ICD-10-CM | POA: Insufficient documentation

## 2013-10-30 DIAGNOSIS — X500XXA Overexertion from strenuous movement or load, initial encounter: Secondary | ICD-10-CM | POA: Insufficient documentation

## 2013-10-30 DIAGNOSIS — IMO0002 Reserved for concepts with insufficient information to code with codable children: Secondary | ICD-10-CM | POA: Insufficient documentation

## 2013-10-30 DIAGNOSIS — M5442 Lumbago with sciatica, left side: Secondary | ICD-10-CM

## 2013-10-30 DIAGNOSIS — F17200 Nicotine dependence, unspecified, uncomplicated: Secondary | ICD-10-CM | POA: Insufficient documentation

## 2013-10-30 DIAGNOSIS — Z8719 Personal history of other diseases of the digestive system: Secondary | ICD-10-CM | POA: Insufficient documentation

## 2013-10-30 DIAGNOSIS — Y9289 Other specified places as the place of occurrence of the external cause: Secondary | ICD-10-CM | POA: Insufficient documentation

## 2013-10-30 MED ORDER — OXYCODONE-ACETAMINOPHEN 5-325 MG PO TABS: 1.0000 | ORAL_TABLET | Freq: Four times a day (QID) | ORAL | Status: DC | PRN

## 2013-10-30 MED ORDER — PREDNISONE 20 MG PO TABS: ORAL_TABLET | ORAL | Status: DC

## 2013-10-30 MED ORDER — CYCLOBENZAPRINE HCL 10 MG PO TABS: 10.0000 mg | ORAL_TABLET | Freq: Three times a day (TID) | ORAL | Status: DC | PRN

## 2013-10-30 NOTE — ED Provider Notes (Signed)
CSN: [id number]     Arrival date & time 10/30/13  2134 History   This chart was scribed for non-physician practitioner, Zacarias Pontes, PA-C working with Leota Jacobsen, MD by Tula Nakayama, ED scribe. This patient was seen in room WTR8/WTR8 and the patient's care was started at 11:00 PM    Chief Complaint  Patient presents with  . Back Pain   Patient is a 43 y.o. male presenting with back pain. The history is provided by the patient. No language interpreter was used.  Back Pain Location:  Lumbar spine Quality: throbbing. Radiates to: lateral aspect of right leg. Pain severity:  Severe Pain is:  Same all the time Onset quality:  Sudden Duration:  8 hours Timing:  Constant Progression:  Unchanged Chronicity:  Chronic Context comment:  Stepped in hole Relieved by:  Nothing Worsened by:  Ambulation and sitting Ineffective treatments: hot water. Associated symptoms: leg pain and tingling   Associated symptoms: no abdominal pain, no bladder incontinence, no bowel incontinence, no chest pain, no dysuria, no fever and no numbness   Risk factors: no hx of cancer    HPI Comments: [name] is a 43 y.o. male with history of chronic pancreatitis and prior back pain who presents to the Emergency Department complaining of 10/10, throbbing, non-radiating constant lower back pain after stepping in a hole 8 hours PTA. He also complains of tingling without associated numbness on the lateral aspect of left leg. "everything" makes his pain worse, including walking. Pt tried heat treatment with no relief to symptoms. He denies numbness or tingling in groin/perianal area, incontinence, fever, SOB, CP, abdominal pain, hematochezia, hematuria, dysuria, neck pain, or weakness. Pt has no history of cancer.   Past Medical History  Diagnosis Date  . Asthma   . Pancreatitis    History reviewed. No pertinent past surgical history. Family History  Problem Relation Age of Onset  . Stroke  Mother   . Hypertension Mother   . Cancer Other   . Diabetes Other    History  Substance Use Topics  . Smoking status: Current Some Day Smoker    Types: Cigarettes  . Smokeless tobacco: Never Used  . Alcohol Use: No    Review of Systems  Constitutional: Negative for fever.  Cardiovascular: Negative for chest pain.  Gastrointestinal: Negative for abdominal pain, blood in stool and bowel incontinence.  Genitourinary: Negative for bladder incontinence, dysuria, hematuria and difficulty urinating.  Musculoskeletal: Positive for arthralgias and back pain. Negative for neck pain.  Skin: Negative for wound.  Neurological: Positive for tingling. Negative for numbness.   10 Systems reviewed and all are negative for acute change except as noted in the HPI.    Allergies  Aspirin and Ibuprofen  Home Medications   Prior to Admission medications   Medication Sig Start Date End Date Taking? Authorizing Provider  meclizine (ANTIVERT) 25 MG tablet Take 25 mg by mouth 3 (three) times daily as needed for nausea.   Yes Historical Provider, MD  Multiple Vitamin (MULTIVITAMIN WITH MINERALS) TABS tablet Take 1 tablet by mouth daily.   Yes Historical Provider, MD  oxyCODONE-acetaminophen (PERCOCET) 5-325 MG per tablet Take 1-2 tablets by mouth every 4 (four) hours as needed. 10/17/13  Yes Veryl Speak, MD  cyclobenzaprine (FLEXERIL) 10 MG tablet Take 1 tablet (10 mg total) by mouth 3 (three) times daily as needed for muscle spasms. 10/30/13   Lue Sykora Strupp Camprubi-Soms, PA-C  oxyCODONE-acetaminophen (PERCOCET) 5-325 MG per tablet Take 1-2 tablets by mouth  every 6 (six) hours as needed for severe pain. 10/30/13   Jandiel Magallanes Strupp Camprubi-Soms, PA-C  predniSONE (DELTASONE) 20 MG tablet 3 tabs po daily x 4 days 10/30/13   Alinah Sheard Strupp Camprubi-Soms, PA-C   BP 132/83  Pulse 63  Temp(Src) 98.1 F (36.7 C) (Oral)  Resp 18  Ht 6\' 2"  (1.88 m)  Wt 180 lb (81.647 kg)  BMI 23.10 kg/m2  SpO2  100% Physical Exam  Nursing note and vitals reviewed. Constitutional: Vital signs are normal. He appears well-developed and well-nourished. No distress.  Afebrile, nontoxic, NAD  HENT:  Head: Normocephalic and atraumatic.  Mouth/Throat: Mucous membranes are normal.  Eyes: Conjunctivae and EOM are normal.  Neck: Normal range of motion. Neck supple. No spinous process tenderness and no muscular tenderness present. No rigidity. No tracheal deviation and normal range of motion present.  FROM intact without spinous process or paraspinous muscle TTP, no bony stepoffs or deformities, no muscle spasms. No rigidity or meningeal signs. No bruising or swelling.  Cardiovascular: Normal rate and intact distal pulses.   Distal pulses intact  Pulmonary/Chest: Effort normal. No respiratory distress.  Abdominal: Normal appearance. He exhibits no distension.  Musculoskeletal:       Lumbar back: He exhibits decreased range of motion (due to pain), tenderness, pain and spasm. He exhibits no bony tenderness, no swelling and no deformity.  ROM limited in lumbar spine due to pain. TTP along bilateral paraspinous muscles in lumbar area with mild spasms, without midline TTP or deformity, no crepitus or bony step offs. +SLR on L, neg SLR on R. Strength 5/5 in all extremities, sensation grossly intact in all extremities, distal pulses intact, mildly antalgic gait but able to ambulate without assistance, DTRs intact  Neurological: He has normal strength and normal reflexes. No sensory deficit.  Strength 5/5 in all extremities, sensation grossly intact in all extremities, DTRs intact  Skin: Skin is warm, dry and intact. No erythema.  Psychiatric: He has a normal mood and affect. His behavior is normal.    ED Course  Procedures (including critical care time) DIAGNOSTIC STUDIES: Oxygen Saturation is 100% on RA, normal by my interpretation.    COORDINATION OF CARE: 11:08 PM-Will give Prednisone taper and Naproxin,  pain medication and muscle relaxer. Advised pt to use heat and avoid lifting any weight over 10 lbs for two weeks. Pt advised to return to ED if he develops fever, incontinence or worsening of symptoms. Pt agreed to treatment plan.   Labs Review Labs Reviewed - No data to display  Imaging Review No results found.   EKG Interpretation None      MDM   Final diagnoses:  Bilateral low back pain with left-sided sciatica    43y/o male with back pain after stepping in hole. No red flag s/s of low back pain. No s/s of central cord compression or cauda equina. Lower extremities are neurovascularly intact and patient is ambulating without difficulty. Doubt need for emergent xrays given lack of traumatic injury and lack of red flag symptoms.   Patient was counseled on back pain precautions and told to do activity as tolerated but do not lift, push, or pull heavy objects more than 10 pounds for the next week. Patient counseled to use ice or heat on back for no longer than 15 minutes every hour.   Rx given for muscle relaxer and counseled on proper use of muscle relaxant medication. Rx given for narcotic pain medicine and counseled on proper use of narcotic pain medications.  Told that they can increase to every 4 hrs if needed while pain is worse. Counseled not to combine this medication with others containing tylenol. Urged patient not to drink alcohol, drive, or perform any other activities that requires focus while taking either of these medications. Given prednisone for sciatica like symptoms.  Patient urged to follow-up with PCP from resource guide if pain does not improve with treatment and rest or if pain becomes recurrent. Urged to return with worsening severe pain, loss of bowel or bladder control, trouble walking. The patient verbalizes understanding and agrees with the plan.  BP 132/83  Pulse 63  Temp(Src) 98.1 F (36.7 C) (Oral)  Resp 18  Ht 6\' 2"  (1.88 m)  Wt 180 lb (81.647 kg)   BMI 23.10 kg/m2  SpO2 100%  Meds ordered this encounter  Medications  . oxyCODONE-acetaminophen (PERCOCET) 5-325 MG per tablet    Sig: Take 1-2 tablets by mouth every 6 (six) hours as needed for severe pain.    Dispense:  6 tablet    Refill:  0    Order Specific Question:  Supervising Provider    Answer:  Noemi Chapel D [[id number]]  . cyclobenzaprine (FLEXERIL) 10 MG tablet    Sig: Take 1 tablet (10 mg total) by mouth 3 (three) times daily as needed for muscle spasms.    Dispense:  15 tablet    Refill:  0    Order Specific Question:  Supervising Provider    Answer:  Noemi Chapel D [[id number]]  . predniSONE (DELTASONE) 20 MG tablet    Sig: 3 tabs po daily x 4 days    Dispense:  12 tablet    Refill:  0    Order Specific Question:  Supervising Provider    Answer:  Noemi Chapel D [[id number]]     Aerin Delany Strupp Camprubi-Soms, PA-C 10/31/13 0001

## 2013-10-30 NOTE — ED Notes (Signed)
Pt presents with low back pain, pt states he has known spinal issues, pt states he stepped in hole today causing worsening pain.

## 2013-10-30 NOTE — Discharge Instructions (Signed)
Back Pain:  Your back pain should be treated with medicines such as ibuprofen or aleve and this back pain should get better over the next 2 weeks.  However if you develop severe or worsening pain, low back pain with fever, numbness, weakness or inability to walk or urinate, you should return to the ER immediately.  Please follow up with your doctor this week for a recheck if still having symptoms.  Low back pain is discomfort in the lower back that may be due to injuries to muscles and ligaments around the spine.  Occasionally, it may be caused by a a problem to a part of the spine called a disc.  The pain may last several days or a week;  However, most patients get completely well in 4 weeks.  Self - care:  The application of heat can help soothe the pain.  Maintaining your daily activities, including walking, is encourged, as it will help you get better faster than just staying in bed. Perform gentle stretching as discussed. Drink plenty of fluids.  Medications are also useful to help with pain control.  A commonly prescribed medications includes acetaminophen.  This medication is generally safe, though you should not take more than 6 of the extra strength (500mg ) pills a day.  Non steroidal anti inflammatory medications including Ibuprofen and naproxen;  These medications help both pain and swelling and are very useful in treating back pain.  They should be taken with food, as they can cause stomach upset, and more seriously, stomach bleeding.    Muscle relaxants:  These medications can help with muscle tightness that is a cause of lower back pain.  Most of these medications can cause drowsiness, and it is not safe to drive or use dangerous machinery while taking them.  Prednisone: antiinflammatory which will help with the tingling in your legs. Take this with breakfast.   SEEK IMMEDIATE MEDICAL ATTENTION IF: New numbness, tingling, weakness, or problem with the use of your arms or legs.    Severe back pain not relieved with medications.  Difficulty with or loss of control of your bowel or bladder control.  Increasing pain in any areas of the body (such as chest or abdominal pain).  Shortness of breath, dizziness or fainting.  Nausea (feeling sick to your stomach), vomiting, fever, or sweats.  You will need to follow up with  Your primary healthcare provider in 1-2 weeks for reassessment.  If you do not have a doctor see the list below.   Back Pain, Adult Back pain is very common. The pain often gets better over time. The cause of back pain is usually not dangerous. Most people can learn to manage their back pain on their own.  HOME CARE   Stay active. Start with short walks on flat ground if you can. Try to walk farther each day.  Do not sit, drive, or stand in one place for more than 30 minutes. Do not stay in bed.  Do not avoid exercise or work. Activity can help your back heal faster.  Be careful when you bend or lift an object. Bend at your knees, keep the object close to you, and do not twist.  Sleep on a firm mattress. Lie on your side, and bend your knees. If you lie on your back, put a pillow under your knees.  Only take medicines as told by your doctor.  Put ice on the injured area.  Put ice in a plastic bag.  Place a  towel between your skin and the bag.  Leave the ice on for 15-20 minutes, 03-04 times a day for the first 2 to 3 days. After that, you can switch between ice and heat packs.  Ask your doctor about back exercises or massage.  Avoid feeling anxious or stressed. Find good ways to deal with stress, such as exercise. GET HELP RIGHT AWAY IF:   Your pain does not go away with rest or medicine.  Your pain does not go away in 1 week.  You have new problems.  You do not feel well.  The pain spreads into your legs.  You cannot control when you poop (bowel movement) or pee (urinate).  Your arms or legs feel weak or lose feeling  (numbness).  You feel sick to your stomach (nauseous) or throw up (vomit).  You have belly (abdominal) pain.  You feel like you may pass out (faint). MAKE SURE YOU:   Understand these instructions.  Will watch your condition.  Will get help right away if you are not doing well or get worse. Document Released: 07/18/2007 Document Revised: 04/23/2011 Document Reviewed: 06/02/2013 [facility] Patient Information 2015 [place], [place]. This information is not intended to replace advice given to you by your health care provider. Make sure you discuss any questions you have with your health care provider.  Back Exercises Back exercises help treat and prevent back injuries. The goal is to increase your strength in your belly (abdominal) and back muscles. These exercises can also help with flexibility. Start these exercises when told by your doctor. HOME CARE Back exercises include: Pelvic Tilt.  Lie on your back with your knees bent. Tilt your pelvis until the lower part of your back is against the floor. Hold this position 5 to 10 sec. Repeat this exercise 5 to 10 times. Knee to Chest.  Pull 1 knee up against your chest and hold for 20 to 30 seconds. Repeat this with the other knee. This may be done with the other leg straight or bent, whichever feels better. Then, pull both knees up against your chest. Sit-Ups or Curl-Ups.  Bend your knees 90 degrees. Start with tilting your pelvis, and do a partial, slow sit-up. Only lift your upper half 30 to 45 degrees off the floor. Take at least 2 to 3 seonds for each sit-up. Do not do sit-ups with your knees out straight. If partial sit-ups are difficult, simply do the above but with only tightening your belly (abdominal) muscles and holding it as told. Hip-Lift.  Lie on your back with your knees flexed 90 degrees. Push down with your feet and shoulders as you raise your hips 2 inches off the floor. Hold for 10 seconds, repeat 5 to 10 times. Back  Arches.  Lie on your stomach. Prop yourself up on bent elbows. Slowly press on your hands, causing an arch in your low back. Repeat 3 to 5 times. Shoulder-Lifts.  Lie face down with arms beside your body. Keep hips and belly pressed to floor as you slowly lift your head and shoulders off the floor. Do not overdo your exercises. Be careful in the beginning. Exercises may cause you some mild back discomfort. If the pain lasts for more than 15 minutes, stop the exercises until you see your doctor. Improvement with exercise for back problems is slow.  Document Released: 03/03/2010 Document Revised: 04/23/2011 Document Reviewed: 11/30/2010 [employer] Patient Information 2015 [place], [place]. This information is not intended to replace advice given to you by  your health care provider. Make sure you discuss any questions you have with your health care provider.  Emergency Department Resource Guide 1) Find a Doctor and Pay Out of Pocket Although you won't have to find out who is covered by your insurance plan, it is a good idea to ask around and get recommendations. You will then need to call the office and see if the doctor you have chosen will accept you as a new patient and what types of options they offer for patients who are self-pay. Some doctors offer discounts or will set up payment plans for their patients who do not have insurance, but you will need to ask so you aren't surprised when you get to your appointment.  2) Contact Your Local Health Department Not all health departments have doctors that can see patients for sick visits, but many do, so it is worth a call to see if yours does. If you don't know where your local health department is, you can check in your phone book. The CDC also has a tool to help you locate your state's health department, and many state websites also have listings of all of their local health departments.  3) Find a [facility] If your illness is not likely to be very  severe or complicated, you may want to try a walk in clinic. These are popping up all over the country in pharmacies, drugstores, and shopping centers. They're usually staffed by nurse practitioners or physician assistants that have been trained to treat common illnesses and complaints. They're usually fairly quick and inexpensive. However, if you have serious medical issues or chronic medical problems, these are probably not your best option.  No Primary Care Doctor: - Call [facility] at  [phone] - they can help you locate a primary care doctor that  accepts your insurance, provides certain services, etc. - Physician Referral Service- [phone]  Chronic Pain Problems: Organization         Address  Phone   Notes  [facility]  [phone] Patients need to be referred by their primary care doctor.   Medication Assistance: Organization         Address  Phone   Notes  [facility] Medication [facility] [address] [place], [place] [address] [phone] --Must be a resident of [facility] -- Must have NO insurance coverage whatsoever (no Medicaid/ Medicare, etc.) -- The pt. MUST have a primary care doctor that directs their care regularly and follows them in the community   [facility]  [phone]   [employer]  [phone]    Agencies that provide inexpensive medical care: Organization         Address  Phone   Notes  [place]  [phone]   [facility]    [phone]   [facility] [place], [place] [address] [phone]   [place] [address], [place] [phone]   [facility]    [phone]   [facility]    [phone]   [place] [address], [place] Phone:  [phone], Fax:  [phone] Hours of Operation:  9 am - 6 pm, M-F.  Also accepts  Medicaid/Medicare and self-pay.  [facility] for [address], [address], [place] Phone: [phone], Fax: [phone]. Hours of  Operation:  8:30 am - 5:30 pm, M-F.  Also accepts Medicaid and self-pay.  [facility] [address] [place] Phone: [phone]   [place], [place], [place] [phone], Ext. [phone] Mondays & Thursdays: 7-9 AM.  First 15 patients are seen on a first come, first serve basis.    [place] Providers:  Organization         Address  Phone   Notes  [facility] [address], [place] [phone] Also accepts self-pay patients.  [facility] [address], [place]  [phone]   [place], [address], [place] [phone]   [facility] [address] , [place] [phone]   Lucianne Lei [address], [place]   [phone] Only accepts [place] patients after they have their name applied to their card.   Self-Pay (no insurance) in [facility]:  Organization         Address  Phone   Notes  Sickle Cell Patients, [facility] [place] [phone]   [facility] [place] [phone]   [facility] [place]  [place], [place], [place] [phone]   [facility]/Dr. Osei-Bonsu  [address] [place] or [address], [place] [phone] Phone number for both [place] and [place] locations is the same.  [facility] and [facility] [address] [place] [phone]   [facility] [address] [place] or [address] [phone]   [facility] [address] [place] [phone], phone; [phone], fax Sees patients 1st and 3rd Saturday of every month.  Must not qualify for public or private insurance (i.e. Medicaid, Medicare, [place] Health Choice, Veterans' Benefits)  Household income should be no more than 200% of the poverty level The clinic cannot treat you if you are pregnant or think you are pregnant  Sexually transmitted diseases are not treated at the clinic.

## 2013-10-31 NOTE — ED Provider Notes (Signed)
Medical screening examination/treatment/procedure(s) were performed by non-physician practitioner and as supervising physician I was immediately available for consultation/collaboration.  Leota Jacobsen, MD 10/31/13 [phone]

## 2013-11-19 ENCOUNTER — Encounter (HOSPITAL_COMMUNITY): Payer: Self-pay | Admitting: Emergency Medicine

## 2013-11-19 ENCOUNTER — Emergency Department (HOSPITAL_COMMUNITY)
Admission: EM | Admit: 2013-11-19 | Discharge: 2013-11-19 | Disposition: A | Discharge status: Home / Self Care | Payer: Medicare Other | Attending: Emergency Medicine | Admitting: Emergency Medicine

## 2013-11-19 DIAGNOSIS — J45909 Unspecified asthma, uncomplicated: Secondary | ICD-10-CM | POA: Insufficient documentation

## 2013-11-19 DIAGNOSIS — K088 Other specified disorders of teeth and supporting structures: Secondary | ICD-10-CM | POA: Insufficient documentation

## 2013-11-19 DIAGNOSIS — K0889 Other specified disorders of teeth and supporting structures: Secondary | ICD-10-CM

## 2013-11-19 DIAGNOSIS — R6883 Chills (without fever): Secondary | ICD-10-CM | POA: Diagnosis not present

## 2013-11-19 DIAGNOSIS — R51 Headache: Secondary | ICD-10-CM | POA: Diagnosis not present

## 2013-11-19 DIAGNOSIS — Z7952 Long term (current) use of systemic steroids: Secondary | ICD-10-CM | POA: Insufficient documentation

## 2013-11-19 DIAGNOSIS — Z79899 Other long term (current) drug therapy: Secondary | ICD-10-CM | POA: Diagnosis not present

## 2013-11-19 DIAGNOSIS — Z720 Tobacco use: Secondary | ICD-10-CM | POA: Diagnosis not present

## 2013-11-19 DIAGNOSIS — Z8719 Personal history of other diseases of the digestive system: Secondary | ICD-10-CM | POA: Diagnosis not present

## 2013-11-19 MED ORDER — HYDROCODONE-ACETAMINOPHEN 5-325 MG PO TABS: 1.0000 | ORAL_TABLET | Freq: Four times a day (QID) | ORAL | Status: DC | PRN

## 2013-11-19 MED ORDER — PENICILLIN V POTASSIUM 250 MG PO TABS: 500.0000 mg | ORAL_TABLET | Freq: Four times a day (QID) | ORAL | Status: AC

## 2013-11-19 NOTE — Discharge Instructions (Signed)
Please call your doctor for a followup appointment within 24-48 hours. When you talk to your doctor please let them know that you were seen in the emergency department and have them acquire all of your records so that they can discuss the findings with you and formulate a treatment plan to fully care for your new and ongoing problems. Please call and set-up an appointment with the dentist Please rest and stay hydrated Please take antibiotics as prescribed and on a full stomach Please apply warm compressions and massage Please take medications as prescribed - while on pain medications there is to be no drinking alcohol, driving, operating any heavy machinery. If extra please dispose in a proper manner. Please do not take any extra Tylenol with this medication for this can lead to Tylenol overdose and liver issues.  Please continue to monitor symptoms closely and if symptoms are to worsen or change (fever greater than 101, chills, sweating, nausea, vomiting, chest pain, shortness of breathe, difficulty breathing, weakness, numbness, tingling, worsening or changes to pain pattern, facial swelling, drainage, bleeding, inability to open and close the jaw, inability to swallow) please report back to the Emergency Department immediately.   Dental Pain A tooth ache may be caused by cavities (tooth decay). Cavities expose the nerve of the tooth to air and hot or cold temperatures. It may come from an infection or abscess (also called a boil or furuncle) around your tooth. It is also often caused by dental caries (tooth decay). This causes the pain you are having. DIAGNOSIS  Your caregiver can diagnose this problem by exam. TREATMENT   If caused by an infection, it may be treated with medications which kill germs (antibiotics) and pain medications as prescribed by your caregiver. Take medications as directed.  Only take over-the-counter or prescription medicines for pain, discomfort, or fever as directed by  your caregiver.  Whether the tooth ache today is caused by infection or dental disease, you should see your dentist as soon as possible for further care. SEEK MEDICAL CARE IF: The exam and treatment you received today has been provided on an emergency basis only. This is not a substitute for complete medical or dental care. If your problem worsens or new problems (symptoms) appear, and you are unable to meet with your dentist, call or return to this location. SEEK IMMEDIATE MEDICAL CARE IF:   You have a fever.  You develop redness and swelling of your face, jaw, or neck.  You are unable to open your mouth.  You have severe pain uncontrolled by pain medicine. MAKE SURE YOU:   Understand these instructions.  Will watch your condition.  Will get help right away if you are not doing well or get worse. Document Released: 01/29/2005 Document Revised: 04/23/2011 Document Reviewed: 09/17/2007 [facility] Patient Information 2015 [place], [place]. This information is not intended to replace advice given to you by your health care provider. Make sure you discuss any questions you have with your health care provider.   Emergency Department Resource Guide 1) Find a Doctor and Pay Out of Pocket Although you won't have to find out who is covered by your insurance plan, it is a good idea to ask around and get recommendations. You will then need to call the office and see if the doctor you have chosen will accept you as a new patient and what types of options they offer for patients who are self-pay. Some doctors offer discounts or will set up payment plans for their patients  who do not have insurance, but you will need to ask so you aren't surprised when you get to your appointment.  2) Contact Your Local Health Department Not all health departments have doctors that can see patients for sick visits, but many do, so it is worth a call to see if yours does. If you don't know where your local health  department is, you can check in your phone book. The CDC also has a tool to help you locate your state's health department, and many state websites also have listings of all of their local health departments.  3) Find a [facility] If your illness is not likely to be very severe or complicated, you may want to try a walk in clinic. These are popping up all over the country in pharmacies, drugstores, and shopping centers. They're usually staffed by nurse practitioners or physician assistants that have been trained to treat common illnesses and complaints. They're usually fairly quick and inexpensive. However, if you have serious medical issues or chronic medical problems, these are probably not your best option.  No Primary Care Doctor: - Call Health Connect at  [phone] - they can help you locate a primary care doctor that  accepts your insurance, provides certain services, etc. - Physician Referral Service- [phone]  Chronic Pain Problems: Organization         Address  Phone   Notes  [facility]  [phone] Patients need to be referred by their primary care doctor.   Medication Assistance: Organization         Address  Phone   Notes  [facility] Medication [facility] [address] [place], [place] [address] [phone] --Must be a resident of [facility] -- Must have NO insurance coverage whatsoever (no Medicaid/ Medicare, etc.) -- The pt. MUST have a primary care doctor that directs their care regularly and follows them in the community   MedAssist  [phone]   [employer]  [phone]    Agencies that provide inexpensive medical care: Organization         Address  Phone   Notes  [place]  [phone]   [facility]    [phone]   [facility] [place],  [address] [phone]   [place] [address], [place] [phone]   Planned Parenthood    [phone]   [facility]    [phone]   [place] and [address], [place] Phone:  [phone], Fax:  [phone] Hours of Operation:  9 am - 6 pm, M-F.  Also accepts Medicaid/Medicare and self-pay.  [facility] for [place], [address], [place] Phone: [phone], Fax: [phone]. Hours of Operation:  8:30 am - 5:30 pm, M-F.  Also accepts Medicaid and self-pay.  [facility] [place] [address] [place] Phone: [phone]   [place], [place], [place] [phone], Ext. [phone] Mondays & Thursdays: 7-9 AM.  First 15 patients are seen on a first come, first serve basis.    [place] Providers:  Organization         Address  Phone   Notes  [facility] [address], [place] [phone] Also accepts self-pay patients.  [facility] Family  Practice [place], [place]  [phone]   [place], [address], [place] [phone]   [facility] [address] [place] [phone]   Lucianne Lei [address], [place]   [phone] Only accepts [place] Access [place] patients after they have their name applied to their card.   Self-Pay (no insurance) in [facility]:  Organization         Address  Phone   Notes  Sickle Cell Patients, [facility] Internal Medicine [place] [phone]   [facility] Urgent Care [place] [phone]   [facility] [place]  [place], [place], [place] [phone]   [facility]/Dr. Osei-Bonsu  [address] [place] or [address], [place] [phone] Phone number for both [place] and  [place] locations is the same.  Urgent Medical and [facility] [address], [place] [phone]   [facility] [address], [place] or [address] [phone]   [facility] [address], [place] [phone], phone; [phone], fax Sees patients 1st and 3rd Saturday of every month.  Must not qualify for public or private insurance (i.e. Medicaid, Medicare, [place] Health Choice, Veterans' Benefits)  Household income should be no more than 200% of the poverty level The clinic cannot treat you if you are pregnant or think you are pregnant  Sexually transmitted diseases are not treated at the clinic.    Dental Care: Organization         Address  Phone  Notes  [facility] Department of [facility] [place] [phone] Accepts children up to age 41 who are enrolled in [place] or [place]; pregnant women with a Medicaid card; and children who have applied for Medicaid or [place] Health Choice, but were declined, whose parents can pay a reduced fee at time of service.  [facility] Department of [facility]  [address], [place] [phone] Accepts children up to age 63 who are enrolled in [place] or [place]; pregnant women with a Medicaid card; and children who have applied for Medicaid or [place] Health Choice, but were declined, whose parents can pay a reduced fee at time of service.  [facility] Adult [facility] Access PROGRAM  [place] [phone] Patients are seen by appointment only. Walk-ins are not accepted. [place] will see patients 18 years of age and older. Monday - Tuesday (8am-5pm) Most Wednesdays (8:30-5pm) $30 per visit, cash only  [facility] Adult Dental Access PROGRAM  [address], [facility] [phone] Patients are seen by appointment only. Walk-ins are not accepted.  [place] will see patients 80 years of age and older. One Wednesday Evening (Monthly: Volunteer Based).  $30 per visit, cash only  [place]  [phone] for adults; Children under age 57, call Graduate Pediatric Dentistry at [phone]. Children aged 107-14, please call [phone] to request a pediatric application.  Dental services are provided in all areas of dental care including fillings, crowns and bridges, complete and partial dentures, implants, gum treatment, root canals, and extractions. Preventive care is also provided. Treatment is provided to both adults and children.  Patients are selected via a lottery and there is often a waiting list.   [facility] [address], [place]  [phone] [email or web address]   [facility] [address] [place], [place] [phone], Ext. [phone] Second and Fourth Thursday of each month, opens at 6:30 AM; Clinic ends at 9 AM.  Patients are seen on a first-come first-served basis, and a limited number are seen during each clinic.   [facility]  [address] Hillard Danker [place], [place] [phone]   Eligibility Requirements You must have lived in [place], [place], or [place] counties for at least the last three months.   You cannot be eligible for state or federal sponsored [employer], including [employer], [place], or [employer].   You generally cannot be eligible for healthcare insurance through your employer.    How to apply: Eligibility screenings are held every Tuesday and Wednesday afternoon from 1:00 pm until 4:00 pm. You do not need an appointment for the interview!  [facility] [address], [place], [place]   [place]  [place] Department  [place]  [phone]    Behavioral Health Resources in the  Community: Intensive Outpatient Programs Organization         Address  Phone  Notes  [facility] [place]. [address] [place], [place] [phone]   [facility] Outpatient [address] [place], [place]   ADS: Alcohol & Drug Svcs [address], [place], [place]   [place] [address]  [place], [place] or [phone]   Substance Abuse Resources Organization         Address  Phone  Notes  Alcohol and Drug Services  [phone]   [place]  [phone]   [facility] [place]   Chinita Pester  [phone]   Residential & Outpatient Substance Abuse Program  [phone]   Psychological Services Organization         Address  Phone  Notes  [facility] [place]  [place]  [phone]   [place] [address] [place] or [phone]    Mobile Crisis Teams Organization         Address  Phone  Notes  [facility], Mobile Crisis Care Unit  [phone]   [facility] Psychotherapeutic [facility]  [address] [place], [place]   [place] Levels [address] [place] [phone]    Self-Help/Support Groups Organization         Address  Phone             Notes  [place]. of [place] - variety of support groups  [place] Call for more information  Narcotics Anonymous (NA), Caring Services [address], [employer] [place]  2 meetings at this location   Special educational needs teacher         Address  Phone  Notes  [facility]idential [facility] [place],    [place]  [phone]   [facility]  [address] [place] [phone], [place], [place]   [place], [place] [phone] Admissions: 8am-3pm M-F  Incentives Substance [place] [address],    [place], [place]   [facility]  [address] Jacinto Reap [place], [place]   [facility] [address]  [place], [place]   [facility] - Intensive Outpatient [address], [place], [place]   [facility] ([place] [address]  [place], [place] [phone] or [phone]   Residential Treatment Services (RTS) [address] [place], [place] Accepts Medicaid  [facility] [place] [address],  [place] [phone] Substance Abuse/Addiction Treatment   [facility] Organization         Address  Phone  Notes  [facility]  [phone]   Domenic Schwab, PhD [address] Arlis Porta [place], [place]   [phone] or [phone]   [place], [place] [phone]   [place], [place], [place] [phone] Insurance/Medicaid/sponsorship through [facility] and Families [address]                                    [place], [place] [phone] [place] [address], [place] [phone]    Dr. Adele Schilder  [phone]   [facility] Dept. 1) [address], [place] 2) [place] 3)  [place], [place] [phone]  [phone]   [place] [phone] or [phone] (After Hours)

## 2013-11-19 NOTE — ED Provider Notes (Signed)
CSN: [id number]     Arrival date & time 11/19/13  1742 History   First MD Initiated Contact with Patient 11/19/13 1753     Chief Complaint  Patient presents with  . Dental Pain     (Consider location/radiation/quality/duration/timing/severity/associated sxs/prior Treatment) The history is provided by the patient. No language interpreter was used.  [name] is a 43 y/o M with PMHx of asthma and pancreatitis presenting to the ED with dental pain that has been ongoing since Monday. Patient reported that while he was eating peanuts he noticed that he heard a cracked and stated that he cracked his right molar in the process. Stated that he has been having pain since then. Reported that the pain is localized to the right side described as a constant pain. Stated that the pain is worse with cold temperatures. Reported that he smokes a couple of cigarettes per day. Stated that he has not been to a dentisit in "years." Denied bleeding, drainage, chest pain, shortness of breath, difficulty breathing, throat closing sensation, blurred vision, sudden loss of vision, neck pain, neck stiffness, difficulty swallowing, fever. PCP none  Past Medical History  Diagnosis Date  . Asthma   . Pancreatitis    History reviewed. No pertinent past surgical history. Family History  Problem Relation Age of Onset  . Stroke Mother   . Hypertension Mother   . Cancer Other   . Diabetes Other    History  Substance Use Topics  . Smoking status: Current Some Day Smoker    Types: Cigarettes  . Smokeless tobacco: Never Used  . Alcohol Use: No    Review of Systems  Constitutional: Positive for chills. Negative for fever.  HENT: Positive for dental problem. Negative for trouble swallowing.   Respiratory: Negative for chest tightness and shortness of breath.   Cardiovascular: Negative for chest pain.  Musculoskeletal: Negative for neck pain.  Neurological: Positive for headaches. Negative for weakness.       Allergies  Aspirin and Ibuprofen  Home Medications   Prior to Admission medications   Medication Sig Start Date End Date Taking? Authorizing Provider  cyclobenzaprine (FLEXERIL) 10 MG tablet Take 1 tablet (10 mg total) by mouth 3 (three) times daily as needed for muscle spasms. 10/30/13   Mercedes Strupp Camprubi-Soms, PA-C  HYDROcodone-acetaminophen (NORCO/VICODIN) 5-325 MG per tablet Take 1 tablet by mouth every 6 (six) hours as needed for moderate pain or severe pain. 11/19/13   Mechille Varghese, PA-C  meclizine (ANTIVERT) 25 MG tablet Take 25 mg by mouth 3 (three) times daily as needed for nausea.    Historical Provider, MD  Multiple Vitamin (MULTIVITAMIN WITH MINERALS) TABS tablet Take 1 tablet by mouth daily.    Historical Provider, MD  oxyCODONE-acetaminophen (PERCOCET) 5-325 MG per tablet Take 1-2 tablets by mouth every 4 (four) hours as needed. 10/17/13   Veryl Speak, MD  oxyCODONE-acetaminophen (PERCOCET) 5-325 MG per tablet Take 1-2 tablets by mouth every 6 (six) hours as needed for severe pain. 10/30/13   Mercedes Strupp Camprubi-Soms, PA-C  penicillin v potassium (VEETID) 250 MG tablet Take 2 tablets (500 mg total) by mouth 4 (four) times daily. 11/19/13 11/26/13  Nataniel Gasper, PA-C  predniSONE (DELTASONE) 20 MG tablet 3 tabs po daily x 4 days 10/30/13   Patty Sermons Camprubi-Soms, PA-C   BP 138/75  Pulse 85  Temp(Src) 98.5 F (36.9 C) (Oral)  Resp 16  Ht 6\' 2"  (1.88 m)  SpO2 98% Physical Exam  Nursing note and vitals reviewed.  Constitutional: He is oriented to person, place, and time. He appears well-developed and well-nourished. No distress.  HENT:  Head: Normocephalic and atraumatic.  Mouth/Throat: Uvula is midline and oropharynx is clear and moist.    Negative swelling. Negative changes of the skin color Diagrammed right second and first molar of the right mandibular jawline with negative active drainage or bleeding noted. Right second molar of the  mandibular jawline completely diagrammed-upper half missing. Negative drainable abscess noted. Uvula midline with symmetrical elevation. Negative swelling of the uvula. Negative trismus.  Circular calcifications identified to the submental region with negative pain upon palpation-negative findings of Ludwig's angina. Soft upon palpation to the submental region.  Eyes: Conjunctivae and EOM are normal. Pupils are equal, round, and reactive to light. Right eye exhibits no discharge. Left eye exhibits no discharge.  Neck: Normal range of motion. Neck supple. No tracheal deviation present.  Negative neck stiffness Negative nuchal rigidity Negative cervical lymphadenopathy Negative meningeal signs   Cardiovascular: Normal rate, regular rhythm and normal heart sounds.   Pulses:      Radial pulses are 2+ on the right side, and 2+ on the left side.  Pulmonary/Chest: Effort normal and breath sounds normal. No respiratory distress. He has no wheezes. He has no rales.  Patient is able to speak in full sentences without difficulty Negative use of accessory muscles Negative stridor  Musculoskeletal: Normal range of motion.  Lymphadenopathy:    He has no cervical adenopathy.  Neurological: He is alert and oriented to person, place, and time. No cranial nerve deficit. He exhibits normal muscle tone. Coordination normal.  Cranial nerves III-XII grossly intact  Skin: Skin is warm and dry. No rash noted. He is not diaphoretic. No erythema.  Psychiatric: He has a normal mood and affect. His behavior is normal. Thought content normal.    ED Course  Procedures (including critical care time)  6:28 PM Patient seen and assessed by attending physician, Dr. Tyron Russell. Patient does not have Ludwig's angina believes to be a calcification of the mouth. Underneath the submental region everything is soft with negative pain upon palpation.   Labs Review Labs Reviewed - No data to display  Imaging Review No  results found.   EKG Interpretation None      MDM   Final diagnoses:  Pain, dental    Medications - No data to display  Filed Vitals:   11/19/13 1746  BP: 138/75  Pulse: 85  Temp: 98.5 F (36.9 C)  TempSrc: Oral  Resp: 16  Height: 6\' 2"  (1.88 m)  SpO2: 98%    Doubt peritonsillar abscess. Doubt retropharyngeal abscess. Doubt Ludwig's angina. Patient presenting to the ED with dental pain secondary to diagrammed first and second molar of the right mandibular jawline. Negative trismus. Negative drainable abscess noted at this time. Patient stable, afebrile. Patient not septic appearing. Negative signs of respiratory distress. Discharged patient. Discharged patient with a small dose of pain medications-discussed course, cautions, disposal technique-and antibiotics. Referred patient to health and wellness Center and dentist. Discussed with patient to apply warm compressions. Discussed with patient to closely monitor symptoms and if symptoms are to worsen or change to report back to the ED - strict return instructions given.  Patient agreed to plan of care, understood, all questions answered.   Jamse Mead, PA-C 11/19/13 1836

## 2013-11-19 NOTE — ED Notes (Signed)
Pt reports he was eating something 4 days ago and broke off bottom R molar. Pt c/o pain to area. reports chills as well.

## 2013-11-20 NOTE — ED Provider Notes (Signed)
Medical screening examination/treatment/procedure(s) were conducted as a shared visit with non-physician practitioner(s) and myself.  I personally evaluated the patient during the encounter.   EKG Interpretation None      Patient with no trismus, airway issues or swelling/abscess. Focal teeth tender as noted, but soft floor of mouth, no signs of deep space infection. Stable for outpatient treatment, f/u with dentist  Ephraim Hamburger, MD 11/20/13 1642

## 2014-01-18 ENCOUNTER — Emergency Department (HOSPITAL_COMMUNITY): Payer: Medicaid Other

## 2014-01-18 ENCOUNTER — Emergency Department (HOSPITAL_COMMUNITY)
Admission: EM | Admit: 2014-01-18 | Discharge: 2014-01-18 | Disposition: A | Discharge status: Home / Self Care | Payer: Medicaid Other | Attending: Emergency Medicine | Admitting: Emergency Medicine

## 2014-01-18 DIAGNOSIS — M25562 Pain in left knee: Secondary | ICD-10-CM | POA: Insufficient documentation

## 2014-01-18 DIAGNOSIS — J45909 Unspecified asthma, uncomplicated: Secondary | ICD-10-CM | POA: Insufficient documentation

## 2014-01-18 DIAGNOSIS — Z720 Tobacco use: Secondary | ICD-10-CM | POA: Insufficient documentation

## 2014-01-18 DIAGNOSIS — R109 Unspecified abdominal pain: Secondary | ICD-10-CM | POA: Insufficient documentation

## 2014-01-18 DIAGNOSIS — Z8719 Personal history of other diseases of the digestive system: Secondary | ICD-10-CM | POA: Insufficient documentation

## 2014-01-18 DIAGNOSIS — Z79899 Other long term (current) drug therapy: Secondary | ICD-10-CM | POA: Insufficient documentation

## 2014-01-18 LAB — CBC WITH DIFFERENTIAL/PLATELET
Basophils Absolute: 0 K/uL (ref 0.0–0.1)
Basophils Relative: 0 % (ref 0–1)
Eosinophils Absolute: 0 K/uL (ref 0.0–0.7)
Eosinophils Relative: 0 % (ref 0–5)
HCT: 44.3 % (ref 39.0–52.0)
Hemoglobin: 14.6 g/dL (ref 13.0–17.0)
Lymphocytes Relative: 18 % (ref 12–46)
Lymphs Abs: 1.2 K/uL (ref 0.7–4.0)
MCH: 30 pg (ref 26.0–34.0)
MCHC: 33 g/dL (ref 30.0–36.0)
MCV: 91.2 fL (ref 78.0–100.0)
Monocytes Absolute: 0.8 K/uL (ref 0.1–1.0)
Monocytes Relative: 11 % (ref 3–12)
Neutro Abs: 4.9 K/uL (ref 1.7–7.7)
Neutrophils Relative %: 71 % (ref 43–77)
Platelets: 181 K/uL (ref 150–400)
RBC: 4.86 MIL/uL (ref 4.22–5.81)
RDW: 13.4 % (ref 11.5–15.5)
WBC: 6.9 K/uL (ref 4.0–10.5)

## 2014-01-18 LAB — URINALYSIS, ROUTINE W REFLEX MICROSCOPIC
Bilirubin Urine: NEGATIVE
Glucose, UA: NEGATIVE mg/dL
Hgb urine dipstick: NEGATIVE
Ketones, ur: NEGATIVE mg/dL
Leukocytes, UA: NEGATIVE
Nitrite: NEGATIVE
Protein, ur: NEGATIVE mg/dL
Specific Gravity, Urine: 1.019 (ref 1.005–1.030)
Urobilinogen, UA: 1 mg/dL (ref 0.0–1.0)
pH: 5.5 (ref 5.0–8.0)

## 2014-01-18 LAB — COMPREHENSIVE METABOLIC PANEL
ALT: 13 U/L (ref 0–53)
AST: 18 U/L (ref 0–37)
Albumin: 3.9 g/dL (ref 3.5–5.2)
Alkaline Phosphatase: 51 U/L (ref 39–117)
Anion gap: 13 (ref 5–15)
BUN: 8 mg/dL (ref 6–23)
CO2: 25 mEq/L (ref 19–32)
Calcium: 9.5 mg/dL (ref 8.4–10.5)
Chloride: 99 mEq/L (ref 96–112)
Creatinine, Ser: 1.1 mg/dL (ref 0.50–1.35)
GFR calc Af Amer: >90 mL/min (ref >90)
GFR calc non Af Amer: 79 mL/min — ABNORMAL LOW (ref >90)
Glucose, Bld: 77 mg/dL (ref 70–99)
Potassium: 3.8 mEq/L (ref 3.7–5.3)
Sodium: 137 mEq/L (ref 137–147)
Total Bilirubin: 0.6 mg/dL (ref 0.3–1.2)
Total Protein: 7.6 g/dL (ref 6.0–8.3)

## 2014-01-18 LAB — LIPASE, BLOOD: Lipase: 17 U/L (ref 11–59)

## 2014-01-18 MED ORDER — PROMETHAZINE HCL 25 MG PO TABS: 25.0000 mg | ORAL_TABLET | Freq: Four times a day (QID) | ORAL | Status: DC | PRN

## 2014-01-18 MED ORDER — HYOSCYAMINE SULFATE 0.125 MG PO TABS
0.1250 mg | ORAL_TABLET | Freq: Once | ORAL | Status: AC
  Administered 2014-01-18: 0.125 mg via ORAL
  Filled 2014-01-18: qty 1

## 2014-01-18 MED ORDER — OXYCODONE-ACETAMINOPHEN 5-325 MG PO TABS
1.0000 | ORAL_TABLET | Freq: Four times a day (QID) | ORAL | Status: DC | PRN
Start: 2014-01-18 — End: 2014-02-08

## 2014-01-18 MED ORDER — HYDROMORPHONE HCL 1 MG/ML IJ SOLN
1.0000 mg | Freq: Once | INTRAMUSCULAR | Status: AC
  Administered 2014-01-18: 1 mg via INTRAMUSCULAR
  Filled 2014-01-18: qty 1

## 2014-01-18 MED ORDER — HYDROMORPHONE HCL 1 MG/ML IJ SOLN: 1.0000 mg | Freq: Once | INTRAMUSCULAR | Status: DC

## 2014-01-18 NOTE — ED Notes (Signed)
MD at bedside. EDP REES IN TO EVALUATE THIS PT

## 2014-01-18 NOTE — ED Provider Notes (Signed)
CSN: [id number]     Arrival date & time 01/18/14  1138 History   First MD Initiated Contact with Patient 01/18/14 1308     Chief Complaint  Patient presents with  . Abdominal Pain  . Sore Throat      Patient is a 43 y.o. male presenting with abdominal pain and pharyngitis. The history is provided by the patient.  Abdominal Pain Sore Throat Associated symptoms include abdominal pain.   [name] presents with one month of L sided abominal pain.  The pain is decribed as sharp, constant, and worsening.  He has sore throat and left leg pain that started today.  He vomited three times yesterday (non bloody).  He denies injury to his left leg.  Abdominal pain feels like his pancreatitis.  He has been out of his pain meds for the last 2-3 months.  Denies fevers, dysuria, diarrhea, or constipation.  Sxs are moderate.   Past Medical History  Diagnosis Date  . Asthma   . Pancreatitis    No past surgical history on file. Family History  Problem Relation Age of Onset  . Stroke Mother   . Hypertension Mother   . Cancer Other   . Diabetes Other    History  Substance Use Topics  . Smoking status: Current Some Day Smoker    Types: Cigarettes  . Smokeless tobacco: Never Used  . Alcohol Use: No    Review of Systems  Gastrointestinal: Positive for abdominal pain.  All other systems reviewed and are negative.     Allergies  Aspirin and Ibuprofen  Home Medications   Prior to Admission medications   Medication Sig Start Date End Date Taking? Authorizing Provider  traZODone (DESYREL) 100 MG tablet Take 100 mg by mouth at bedtime.   Yes Historical Provider, MD  cyclobenzaprine (FLEXERIL) 10 MG tablet Take 1 tablet (10 mg total) by mouth 3 (three) times daily as needed for muscle spasms. 10/30/13   Mercedes Strupp Camprubi-Soms, PA-C  HYDROcodone-acetaminophen (NORCO/VICODIN) 5-325 MG per tablet Take 1 tablet by mouth every 6 (six) hours as needed for moderate pain or severe pain.  11/19/13   Marissa Sciacca, PA-C  meclizine (ANTIVERT) 25 MG tablet Take 25 mg by mouth 3 (three) times daily as needed for nausea.    Historical Provider, MD  oxyCODONE-acetaminophen (PERCOCET) 5-325 MG per tablet Take 1-2 tablets by mouth every 4 (four) hours as needed. Patient not taking: Reported on 01/18/2014 10/17/13   Veryl Speak, MD  oxyCODONE-acetaminophen (PERCOCET) 5-325 MG per tablet Take 1-2 tablets by mouth every 6 (six) hours as needed for severe pain. 10/30/13   Mercedes Strupp Camprubi-Soms, PA-C  predniSONE (DELTASONE) 20 MG tablet 3 tabs po daily x 4 days Patient not taking: Reported on 01/18/2014 10/30/13   Patty Sermons Camprubi-Soms, PA-C   BP 120/83 mmHg  Pulse 85  Temp(Src) 98.5 F (36.9 C) (Oral)  Resp 16  SpO2 100% Physical Exam  Constitutional: He is oriented to person, place, and time. He appears well-developed and well-nourished.  HENT:  Head: Normocephalic and atraumatic.  Mild erythema in posterior OP  Cardiovascular: Normal rate.   No murmur heard. Pulmonary/Chest: Effort normal. No respiratory distress.  Abdominal: Soft. There is no rebound and no guarding.  Moderate L sided abdominal tednerness, no guarding or rebound tenderness.    Musculoskeletal: He exhibits no edema.  ttp over left posterior knee without local swelling or rash.  No bony tenderness over the knee.  Able to flex/extend knee.  No  joint effusion.    Neurological: He is alert and oriented to person, place, and time.  Skin: Skin is warm and dry.  Psychiatric: He has a normal mood and affect. His behavior is normal.  Nursing note and vitals reviewed.   ED Course  Procedures (including critical care time) Labs Review Labs Reviewed  COMPREHENSIVE METABOLIC PANEL - Abnormal; Notable for the following:    GFR calc non Af Amer 79 (*)    All other components within normal limits  CBC WITH DIFFERENTIAL  LIPASE, BLOOD  URINALYSIS, ROUTINE W REFLEX MICROSCOPIC    Imaging Review Dg Abd  Acute W/chest  01/18/2014   CLINICAL DATA:  Acute generalized abdominal pain, smoker  EXAM: ACUTE ABDOMEN SERIES (ABDOMEN 2 VIEW & CHEST 1 VIEW)  COMPARISON:  12/29/2011, 06/26/2013  FINDINGS: Stable hyperinflation compatible with COPD. No focal pneumonia, collapse or consolidation. Normal heart size and vascularity. No edema, effusion or pneumothorax. Trachea midline.  No free air evident. Nonspecific gaseous distention of small bowel and the transverse colon the mid abdomen. Air and stool throughout the colon extending to the rectum. No definite obstruction pattern by plain radiography. Benign pelvic calcifications on the right. No acute osseous finding.  IMPRESSION: Stable COPD/emphysema with hyperinflation.  No acute chest process  Negative for free air  Nonspecific bowel gas pattern with mild gaseous distention in the mid abdomen but no definite obstruction.   Electronically Signed   By: Daryll Brod M.D.   On: 01/18/2014 14:58     EKG Interpretation None      MDM   Final diagnoses:  Left sided abdominal pain of unknown cause    Patient with history of chronic pancreatitis here for evaluation of left-sided abdominal pain. Patient without active vomiting in the emergency department. Clinical picture is not consistent with small bowel obstruction, acute appendicitis, acute pancreatitis, renal colic, acute cholecystitis. Patient has been out of his pain meds for the last 2 months since his pain clinic has closed. Discussed with patient home care for acute on chronic abdominal pain with rest and fluid hydration and gentle advancement of his diet. Also discussed with patient importance of PCP follow-up and close return precautions for development of new or concerning symptoms.    Quintella Reichert, MD 01/18/14 1540

## 2014-01-18 NOTE — ED Notes (Addendum)
Pt reports sharp LLQ for a month, 3 episodes of vomiting yesterday, 1 episode of diarrhea yesterday, sore throat this morning pt believe is related to vomiting, and left leg pain this morning. Pt hx of pancreatitis and reports has not had his pain medication for 2-3 months.

## 2014-01-18 NOTE — Discharge Instructions (Signed)
The cause of your abdominal pain was not identified today. Please get rechecked by your family doctor tomorrow. If you develops fevers, worsening pain, or new concerning symptoms please get rechecked immediately.  Abdominal Pain Many things can cause abdominal pain. Usually, abdominal pain is not caused by a disease and will improve without treatment. It can often be observed and treated at home. Your health care provider will do a physical exam and possibly order blood tests and X-rays to help determine the seriousness of your pain. However, in many cases, more time must pass before a clear cause of the pain can be found. Before that point, your health care provider may not know if you need more testing or further treatment. HOME CARE INSTRUCTIONS  Monitor your abdominal pain for any changes. The following actions may help to alleviate any discomfort you are experiencing:  Only take over-the-counter or prescription medicines as directed by your health care provider.  Do not take laxatives unless directed to do so by your health care provider.  Try a clear liquid diet (broth, tea, or water) as directed by your health care provider. Slowly move to a bland diet as tolerated. SEEK MEDICAL CARE IF:  You have unexplained abdominal pain.  You have abdominal pain associated with nausea or diarrhea.  You have pain when you urinate or have a bowel movement.  You experience abdominal pain that wakes you in the night.  You have abdominal pain that is worsened or improved by eating food.  You have abdominal pain that is worsened with eating fatty foods.  You have a fever. SEEK IMMEDIATE MEDICAL CARE IF:   Your pain does not go away within 2 hours.  You keep throwing up (vomiting).  Your pain is felt only in portions of the abdomen, such as the right side or the left lower portion of the abdomen.  You pass bloody or black tarry stools. MAKE SURE YOU:  Understand these instructions.   Will  watch your condition.   Will get help right away if you are not doing well or get worse.  Document Released: 11/08/2004 Document Revised: 02/03/2013 Document Reviewed: 10/08/2012 [facility] Patient Information 2015 [place], [place]. This information is not intended to replace advice given to you by your health care provider. Make sure you discuss any questions you have with your health care provider.

## 2014-01-18 NOTE — ED Notes (Addendum)
Pt c/o stabbin mid-low abdominal pain radiating to lower back and left leg, urinary frequency, n/v, sore throat.

## 2014-01-18 NOTE — ED Notes (Signed)
Patient transported to X-ray 

## 2014-02-08 ENCOUNTER — Emergency Department (HOSPITAL_COMMUNITY)
Admission: EM | Admit: 2014-02-08 | Discharge: 2014-02-08 | Disposition: A | Discharge status: Home / Self Care | Payer: Medicaid Other | Attending: Emergency Medicine | Admitting: Emergency Medicine

## 2014-02-08 ENCOUNTER — Encounter (HOSPITAL_COMMUNITY): Payer: Self-pay | Admitting: Family Medicine

## 2014-02-08 DIAGNOSIS — Z8719 Personal history of other diseases of the digestive system: Secondary | ICD-10-CM | POA: Insufficient documentation

## 2014-02-08 DIAGNOSIS — G8929 Other chronic pain: Secondary | ICD-10-CM

## 2014-02-08 DIAGNOSIS — Z79899 Other long term (current) drug therapy: Secondary | ICD-10-CM | POA: Insufficient documentation

## 2014-02-08 DIAGNOSIS — Z720 Tobacco use: Secondary | ICD-10-CM | POA: Insufficient documentation

## 2014-02-08 DIAGNOSIS — J45909 Unspecified asthma, uncomplicated: Secondary | ICD-10-CM | POA: Insufficient documentation

## 2014-02-08 DIAGNOSIS — R109 Unspecified abdominal pain: Secondary | ICD-10-CM | POA: Insufficient documentation

## 2014-02-08 LAB — URINALYSIS, ROUTINE W REFLEX MICROSCOPIC
Bilirubin Urine: NEGATIVE
Glucose, UA: NEGATIVE mg/dL
Hgb urine dipstick: NEGATIVE
Ketones, ur: NEGATIVE mg/dL
Leukocytes, UA: NEGATIVE
Nitrite: NEGATIVE
Protein, ur: NEGATIVE mg/dL
Specific Gravity, Urine: 1.018 (ref 1.005–1.030)
Urobilinogen, UA: 0.2 mg/dL (ref 0.0–1.0)
pH: 6 (ref 5.0–8.0)

## 2014-02-08 LAB — COMPREHENSIVE METABOLIC PANEL
ALT: 17 U/L (ref 0–53)
AST: 22 U/L (ref 0–37)
Albumin: 4 g/dL (ref 3.5–5.2)
Alkaline Phosphatase: 49 U/L (ref 39–117)
Anion gap: 8 (ref 5–15)
BUN: 9 mg/dL (ref 6–23)
CO2: 26 mmol/L (ref 19–32)
Calcium: 9.4 mg/dL (ref 8.4–10.5)
Chloride: 103 mEq/L (ref 96–112)
Creatinine, Ser: 1.06 mg/dL (ref 0.50–1.35)
GFR calc Af Amer: >90 mL/min (ref >90)
GFR calc non Af Amer: 83 mL/min — ABNORMAL LOW (ref >90)
Glucose, Bld: 96 mg/dL (ref 70–99)
Potassium: 4.5 mmol/L (ref 3.5–5.1)
Sodium: 137 mmol/L (ref 135–145)
Total Bilirubin: 0.9 mg/dL (ref 0.3–1.2)
Total Protein: 7.2 g/dL (ref 6.0–8.3)

## 2014-02-08 LAB — CBC WITH DIFFERENTIAL/PLATELET
Basophils Absolute: 0 10*3/uL (ref 0.0–0.1)
Basophils Relative: 1 % (ref 0–1)
Eosinophils Absolute: 0 10*3/uL (ref 0.0–0.7)
Eosinophils Relative: 0 % (ref 0–5)
HCT: 44.1 % (ref 39.0–52.0)
Hemoglobin: 14.9 g/dL (ref 13.0–17.0)
Lymphocytes Relative: 44 % (ref 12–46)
Lymphs Abs: 1.8 10*3/uL (ref 0.7–4.0)
MCH: 30.3 pg (ref 26.0–34.0)
MCHC: 33.8 g/dL (ref 30.0–36.0)
MCV: 89.8 fL (ref 78.0–100.0)
Monocytes Absolute: 0.4 10*3/uL (ref 0.1–1.0)
Monocytes Relative: 10 % (ref 3–12)
Neutro Abs: 1.9 10*3/uL (ref 1.7–7.7)
Neutrophils Relative %: 45 % (ref 43–77)
Platelets: 185 10*3/uL (ref 150–400)
RBC: 4.91 MIL/uL (ref 4.22–5.81)
RDW: 13.9 % (ref 11.5–15.5)
WBC: 4.2 10*3/uL (ref 4.0–10.5)

## 2014-02-08 LAB — LIPASE, BLOOD: Lipase: 24 U/L (ref 11–59)

## 2014-02-08 MED ORDER — OXYCODONE-ACETAMINOPHEN 5-325 MG PO TABS
1.0000 | ORAL_TABLET | Freq: Once | ORAL | Status: AC
  Administered 2014-02-08: 1 via ORAL
  Filled 2014-02-08: qty 1

## 2014-02-08 MED ORDER — OXYCODONE-ACETAMINOPHEN 5-325 MG PO TABS
1.0000 | ORAL_TABLET | Freq: Three times a day (TID) | ORAL | Status: DC | PRN
Start: 2014-02-08 — End: 2014-07-23

## 2014-02-08 NOTE — ED Provider Notes (Signed)
CSN: [id number]     Arrival date & time 02/08/14  1433 History   First MD Initiated Contact with Patient 02/08/14 1840     Chief Complaint  Patient presents with  . Abdominal Pain     (Consider location/radiation/quality/duration/timing/severity/associated sxs/prior Treatment) The history is provided by the patient. No language interpreter was used.  [name] is a 43 y/o M with PMHx of asthma and chronic pancreatitis presenting to the ED with abdominal pain that started last week when the patient ran out of Percocets. Patient reported that the pain is localized to left abdomen that radiates to her left groin and back described as a sharp shooting pain. Patient reported that this is normal for him to have this pain when he runs out of his pain medications. Stated that he was diagnosed with pancreatitis in 2008 and has been on Percocet since then. Stated that when he runs out of Percocets the pain returns. Denied alcohol use. Denied fever, chills, chest pain, shortness of breath, difficulty breathing, melena, hematochezia, hematuria, nausea, vomiting, diarrhea, dysuria, penile swelling, testicular swelling, penile discharge. PCP none  Past Medical History  Diagnosis Date  . Asthma   . Pancreatitis    History reviewed. No pertinent past surgical history. Family History  Problem Relation Age of Onset  . Stroke Mother   . Hypertension Mother   . Cancer Other   . Diabetes Other    History  Substance Use Topics  . Smoking status: Current Some Day Smoker    Types: Cigarettes  . Smokeless tobacco: Never Used  . Alcohol Use: No    Review of Systems  Constitutional: Negative for fever and chills.  Eyes: Negative for visual disturbance.  Respiratory: Negative for chest tightness and shortness of breath.   Cardiovascular: Negative for chest pain.  Gastrointestinal: Positive for abdominal pain. Negative for nausea, vomiting, diarrhea, constipation, blood in stool and anal bleeding.   Genitourinary: Negative for dysuria, hematuria, decreased urine volume, discharge, penile swelling, scrotal swelling, penile pain and testicular pain.  Musculoskeletal: Positive for back pain. Negative for neck pain and neck stiffness.  Neurological: Negative for dizziness and headaches.      Allergies  Aspirin and Ibuprofen  Home Medications   Prior to Admission medications   Medication Sig Start Date End Date Taking? Authorizing Provider  traZODone (DESYREL) 100 MG tablet Take 100 mg by mouth at bedtime.   Yes Historical Provider, MD  cyclobenzaprine (FLEXERIL) 10 MG tablet Take 1 tablet (10 mg total) by mouth 3 (three) times daily as needed for muscle spasms. Patient not taking: Reported on 02/08/2014 10/30/13   Patty Sermons Camprubi-Soms, PA-C  meclizine (ANTIVERT) 25 MG tablet Take 25 mg by mouth 3 (three) times daily as needed for nausea.    Historical Provider, MD  oxyCODONE-acetaminophen (PERCOCET/ROXICET) 5-325 MG per tablet Take 1 tablet by mouth every 8 (eight) hours as needed for moderate pain or severe pain. 02/08/14   Mateo Overbeck, PA-C  promethazine (PHENERGAN) 25 MG tablet Take 1 tablet (25 mg total) by mouth every 6 (six) hours as needed for nausea or vomiting. 01/18/14   Quintella Reichert, MD   BP 122/69 mmHg  Pulse 62  Temp(Src) 98 F (36.7 C) (Oral)  Resp 16  SpO2 99% Physical Exam  Constitutional: He is oriented to person, place, and time. He appears well-developed and well-nourished. No distress.  HENT:  Head: Normocephalic and atraumatic.  Mouth/Throat: Oropharynx is clear and moist. No oropharyngeal exudate.  Eyes: Conjunctivae and EOM are  normal. Pupils are equal, round, and reactive to light. Right eye exhibits no discharge. Left eye exhibits no discharge.  Neck: Normal range of motion. Neck supple. No tracheal deviation present.  Cardiovascular: Normal rate, regular rhythm and normal heart sounds.  Exam reveals no friction rub.   No murmur  heard. Pulmonary/Chest: Effort normal and breath sounds normal. No respiratory distress. He has no wheezes. He has no rales.  Abdominal: Soft. Bowel sounds are normal. He exhibits no distension. There is tenderness. There is no rebound and no guarding.  Negative abdominal distention Bowel sounds normal active in all 4 quadrants Abdomen soft upon palpation Diffuse tenderness upon palpation Negative peritoneal signs  Genitourinary:  Exam: Negative swelling, erythema, formation, lesions, sores, deformities identified to the scrotum. Negative hydrocele, varicocele. Negative pain upon palpation to the testicles bilaterally. Negative high riding testicle. Exam chaperoned with nurse, Gabe  Musculoskeletal: Normal range of motion.  Lymphadenopathy:    He has no cervical adenopathy.  Neurological: He is alert and oriented to person, place, and time. No cranial nerve deficit. He exhibits normal muscle tone. Coordination normal.  Skin: Skin is warm and dry. No rash noted. He is not diaphoretic. No erythema.  Psychiatric: He has a normal mood and affect. His behavior is normal. Thought content normal.  Nursing note and vitals reviewed.   ED Course  Procedures (including critical care time)  Results for orders placed or performed during the hospital encounter of 02/08/14  CBC with Differential  Result Value Ref Range   WBC 4.2 4.0 - 10.5 K/uL   RBC 4.91 4.22 - 5.81 MIL/uL   Hemoglobin 14.9 13.0 - 17.0 g/dL   HCT 44.1 39.0 - 52.0 %   MCV 89.8 78.0 - 100.0 fL   MCH 30.3 26.0 - 34.0 pg   MCHC 33.8 30.0 - 36.0 g/dL   RDW 13.9 11.5 - 15.5 %   Platelets 185 150 - 400 K/uL   Neutrophils Relative % 45 43 - 77 %   Neutro Abs 1.9 1.7 - 7.7 K/uL   Lymphocytes Relative 44 12 - 46 %   Lymphs Abs 1.8 0.7 - 4.0 K/uL   Monocytes Relative 10 3 - 12 %   Monocytes Absolute 0.4 0.1 - 1.0 K/uL   Eosinophils Relative 0 0 - 5 %   Eosinophils Absolute 0.0 0.0 - 0.7 K/uL   Basophils Relative 1 0 - 1 %    Basophils Absolute 0.0 0.0 - 0.1 K/uL  Comprehensive metabolic panel  Result Value Ref Range   Sodium 137 135 - 145 mmol/L   Potassium 4.5 3.5 - 5.1 mmol/L   Chloride 103 96 - 112 mEq/L   CO2 26 19 - 32 mmol/L   Glucose, Bld 96 70 - 99 mg/dL   BUN 9 6 - 23 mg/dL   Creatinine, Ser 1.06 0.50 - 1.35 mg/dL   Calcium 9.4 8.4 - 10.5 mg/dL   Total Protein 7.2 6.0 - 8.3 g/dL   Albumin 4.0 3.5 - 5.2 g/dL   AST 22 0 - 37 U/L   ALT 17 0 - 53 U/L   Alkaline Phosphatase 49 39 - 117 U/L   Total Bilirubin 0.9 0.3 - 1.2 mg/dL   GFR calc non Af Amer 83 (L) >90 mL/min   GFR calc Af Amer >90 >90 mL/min   Anion gap 8 5 - 15  Lipase, blood  Result Value Ref Range   Lipase 24 11 - 59 U/L  Urinalysis, Routine w reflex microscopic  Result Value Ref Range   Color, Urine YELLOW YELLOW   APPearance CLEAR CLEAR   Specific Gravity, Urine 1.018 1.005 - 1.030   pH 6.0 5.0 - 8.0   Glucose, UA NEGATIVE NEGATIVE mg/dL   Hgb urine dipstick NEGATIVE NEGATIVE   Bilirubin Urine NEGATIVE NEGATIVE   Ketones, ur NEGATIVE NEGATIVE mg/dL   Protein, ur NEGATIVE NEGATIVE mg/dL   Urobilinogen, UA 0.2 0.0 - 1.0 mg/dL   Nitrite NEGATIVE NEGATIVE   Leukocytes, UA NEGATIVE NEGATIVE    Labs Review Labs Reviewed  COMPREHENSIVE METABOLIC PANEL - Abnormal; Notable for the following:    GFR calc non Af Amer 83 (*)    All other components within normal limits  CBC WITH DIFFERENTIAL  LIPASE, BLOOD  URINALYSIS, ROUTINE W REFLEX MICROSCOPIC    Imaging Review No results found.   EKG Interpretation None      MDM   Final diagnoses:  Chronic abdominal pain    Medications  oxyCODONE-acetaminophen (PERCOCET/ROXICET) 5-325 MG per tablet 1 tablet (1 tablet Oral Given 02/08/14 1523)    Filed Vitals:   02/08/14 1945 02/08/14 2000 02/08/14 2045 02/08/14 2116  BP: 103/56 123/81 107/62 122/69  Pulse: 54 57 64 62  Temp:    98 F (36.7 C)  TempSrc:    Oral  Resp:  16 16 16   SpO2: 100% 100% 100% 99%   This  provider reviewed the patient's chart. Patient had a CT abdomen and pelvis with contrast performed on 06/26/2013 with negative acute abnormalities. Patient was seen in the ED setting on 01/18/2014 regarding abdominal pain where he was discharged with 10 Percocets. CBC negative elevated white blood cell count. Hemoglobin 14.9, hematocrit 44.1. CMP unremarkable-negative elevated glucose, and negative elevated anion gap. Lipase negative elevation. Urinalysis negative hemoglobin, negative nitrites and negative leukocytes. Negative signs of urinary tract infection. Abdominal exam benign. Negative findings of acute urinary tract infection. Doubt dissection. Negative findings of acute pancreatitis. This appears to be chronic issue. Patient mainly here for pain medication refills. Patient stable, afebrile. Patient not septic appearing. Discharged patient. Discharge patient with small dose of pain medications-discussed course, cautions, disposal technique. Discussed with patient to follow-up with [facility] and GI. Discussed with patient to rest and stay hydrated. Discussed with patient to closely monitor symptoms and if symptoms are to worsen or change to report back to the ED - strict return instructions given.  Patient agreed to plan of care, understood, all questions answered.   Jamse Mead, PA-C 02/08/14 2120  Nat Christen, MD 02/09/14 [phone]

## 2014-02-08 NOTE — Discharge Instructions (Signed)
Please call your doctor for a followup appointment within 24-48 hours. When you talk to your doctor please let them know that you were seen in the emergency department and have them acquire all of your records so that they can discuss the findings with you and formulate a treatment plan to fully care for your new and ongoing problems. Please call and set-up an appointment with [facility] to be seen and assessed  Please follow up with Gastroenterology Please take medications as prescribed - while on pain medications there is to be no drinking alcohol, driving, operating any heavy machinery. If extra please dispose in a proper manner. Please do not take any extra Tylenol with this medication for this can lead to Tylenol overdose and liver issues.  Please continue to monitor symptoms closely and if symptoms are to worsen or change (fever greater than 101, chills, sweating, nausea, vomiting, chest pain, shortness of breathe, difficulty breathing, weakness, numbness, tingling, worsening or changes to pain pattern, blood in the stools, black tarry stools, inability to keep any food or fluids down, dizziness, headache, pain with urination) please report back to the Emergency Department immediately.    Abdominal Pain Many things can cause abdominal pain. Usually, abdominal pain is not caused by a disease and will improve without treatment. It can often be observed and treated at home. Your health care provider will do a physical exam and possibly order blood tests and X-rays to help determine the seriousness of your pain. However, in many cases, more time must pass before a clear cause of the pain can be found. Before that point, your health care provider may not know if you need more testing or further treatment. HOME CARE INSTRUCTIONS  Monitor your abdominal pain for any changes. The following actions may help to alleviate any discomfort you are experiencing:  Only take over-the-counter or  prescription medicines as directed by your health care provider.  Do not take laxatives unless directed to do so by your health care provider.  Try a clear liquid diet (broth, tea, or water) as directed by your health care provider. Slowly move to a bland diet as tolerated. SEEK MEDICAL CARE IF:  You have unexplained abdominal pain.  You have abdominal pain associated with nausea or diarrhea.  You have pain when you urinate or have a bowel movement.  You experience abdominal pain that wakes you in the night.  You have abdominal pain that is worsened or improved by eating food.  You have abdominal pain that is worsened with eating fatty foods.  You have a fever. SEEK IMMEDIATE MEDICAL CARE IF:   Your pain does not go away within 2 hours.  You keep throwing up (vomiting).  Your pain is felt only in portions of the abdomen, such as the right side or the left lower portion of the abdomen.  You pass bloody or black tarry stools. MAKE SURE YOU:  Understand these instructions.   Will watch your condition.   Will get help right away if you are not doing well or get worse.  Document Released: 11/08/2004 Document Revised: 02/03/2013 Document Reviewed: 10/08/2012 [facility] Patient Information 2015 [place], [place]. This information is not intended to replace advice given to you by your health care provider. Make sure you discuss any questions you have with your health care provider.

## 2014-02-08 NOTE — ED Notes (Signed)
Per pt sts that he has been having abd pain since he ran out of his pain meds. sts that it is his pancreas.

## 2014-02-08 NOTE — ED Notes (Signed)
PA at the bedside.

## 2014-02-12 HISTORY — PX: COLONOSCOPY: SHX174

## 2014-03-17 ENCOUNTER — Encounter (HOSPITAL_COMMUNITY): Payer: Self-pay | Admitting: *Deleted

## 2014-03-17 ENCOUNTER — Emergency Department (HOSPITAL_COMMUNITY)
Admission: EM | Admit: 2014-03-17 | Discharge: 2014-03-17 | Disposition: A | Discharge status: Home / Self Care | Payer: Medicare Other | Attending: Emergency Medicine | Admitting: Emergency Medicine

## 2014-03-17 DIAGNOSIS — J45909 Unspecified asthma, uncomplicated: Secondary | ICD-10-CM | POA: Diagnosis not present

## 2014-03-17 DIAGNOSIS — R109 Unspecified abdominal pain: Secondary | ICD-10-CM | POA: Diagnosis present

## 2014-03-17 DIAGNOSIS — Z720 Tobacco use: Secondary | ICD-10-CM | POA: Insufficient documentation

## 2014-03-17 DIAGNOSIS — Z79899 Other long term (current) drug therapy: Secondary | ICD-10-CM | POA: Diagnosis not present

## 2014-03-17 DIAGNOSIS — R1013 Epigastric pain: Secondary | ICD-10-CM | POA: Diagnosis not present

## 2014-03-17 LAB — URINALYSIS, ROUTINE W REFLEX MICROSCOPIC
Bilirubin Urine: NEGATIVE
Glucose, UA: NEGATIVE mg/dL
Hgb urine dipstick: NEGATIVE
Ketones, ur: NEGATIVE mg/dL
Leukocytes, UA: NEGATIVE
Nitrite: NEGATIVE
Protein, ur: NEGATIVE mg/dL
Specific Gravity, Urine: 1.018 (ref 1.005–1.030)
Urobilinogen, UA: 0.2 mg/dL (ref 0.0–1.0)
pH: 6.5 (ref 5.0–8.0)

## 2014-03-17 LAB — COMPREHENSIVE METABOLIC PANEL
ALT: 24 U/L (ref 0–53)
AST: 28 U/L (ref 0–37)
Albumin: 4.2 g/dL (ref 3.5–5.2)
Alkaline Phosphatase: 53 U/L (ref 39–117)
Anion gap: 6 (ref 5–15)
BUN: 5 mg/dL — ABNORMAL LOW (ref 6–23)
CO2: 31 mmol/L (ref 19–32)
Calcium: 9.3 mg/dL (ref 8.4–10.5)
Chloride: 102 mmol/L (ref 96–112)
Creatinine, Ser: 1.06 mg/dL (ref 0.50–1.35)
GFR calc Af Amer: >90 mL/min (ref >90)
GFR calc non Af Amer: 83 mL/min — ABNORMAL LOW (ref >90)
Glucose, Bld: 106 mg/dL — ABNORMAL HIGH (ref 70–99)
Potassium: 4.2 mmol/L (ref 3.5–5.1)
Sodium: 139 mmol/L (ref 135–145)
Total Bilirubin: 0.4 mg/dL (ref 0.3–1.2)
Total Protein: 7.1 g/dL (ref 6.0–8.3)

## 2014-03-17 LAB — CBC WITH DIFFERENTIAL/PLATELET
Basophils Absolute: 0 10*3/uL (ref 0.0–0.1)
Basophils Relative: 0 % (ref 0–1)
Eosinophils Absolute: 0 10*3/uL (ref 0.0–0.7)
Eosinophils Relative: 0 % (ref 0–5)
HCT: 44 % (ref 39.0–52.0)
Hemoglobin: 15 g/dL (ref 13.0–17.0)
Lymphocytes Relative: 59 % — ABNORMAL HIGH (ref 12–46)
Lymphs Abs: 2 10*3/uL (ref 0.7–4.0)
MCH: 30.4 pg (ref 26.0–34.0)
MCHC: 34.1 g/dL (ref 30.0–36.0)
MCV: 89.2 fL (ref 78.0–100.0)
Monocytes Absolute: 0.4 10*3/uL (ref 0.1–1.0)
Monocytes Relative: 12 % (ref 3–12)
Neutro Abs: 1 10*3/uL — ABNORMAL LOW (ref 1.7–7.7)
Neutrophils Relative %: 29 % — ABNORMAL LOW (ref 43–77)
Platelets: 183 10*3/uL (ref 150–400)
RBC: 4.93 MIL/uL (ref 4.22–5.81)
RDW: 13.9 % (ref 11.5–15.5)
WBC: 3.5 10*3/uL — ABNORMAL LOW (ref 4.0–10.5)

## 2014-03-17 LAB — LIPASE, BLOOD: Lipase: 25 U/L (ref 11–59)

## 2014-03-17 MED ORDER — FAMOTIDINE 20 MG PO TABS
20.0000 mg | ORAL_TABLET | Freq: Once | ORAL | Status: AC
  Administered 2014-03-17: 20 mg via ORAL
  Filled 2014-03-17: qty 1

## 2014-03-17 MED ORDER — HYDROCODONE-ACETAMINOPHEN 5-325 MG PO TABS: 1.0000 | ORAL_TABLET | Freq: Four times a day (QID) | ORAL | Status: DC | PRN

## 2014-03-17 MED ORDER — GI COCKTAIL ~~LOC~~
30.0000 mL | Freq: Once | ORAL | Status: AC
  Administered 2014-03-17: 30 mL via ORAL
  Filled 2014-03-17: qty 30

## 2014-03-17 MED ORDER — OXYCODONE-ACETAMINOPHEN 5-325 MG PO TABS
2.0000 | ORAL_TABLET | Freq: Once | ORAL | Status: AC
  Administered 2014-03-17: 2 via ORAL
  Filled 2014-03-17: qty 2

## 2014-03-17 NOTE — ED Provider Notes (Signed)
CSN: [id number]     Arrival date & time 03/17/14  1516 History   First MD Initiated Contact with Patient 03/17/14 1630     Chief Complaint  Patient presents with  . Abdominal Pain     (Consider location/radiation/quality/duration/timing/severity/associated sxs/prior Treatment) Patient is a 44 y.o. male presenting with abdominal pain. The history is provided by the patient.  Abdominal Pain Associated symptoms: no chest pain, no chills, no cough, no diarrhea, no dysuria, no fever, no hematuria, no shortness of breath, no sore throat and no vomiting   pt w hx pancreatitis, c/o epigastric pain in the past couple days. Pain dull, constant, mod-sev, no specific exacerbating or allev factors. Normal appetite. No nv. No back or flank pain. No fever or chills. Denies abd injury or trauma. No prior abd surgery. Having normal bms incl today. No dysuria or gu c/o. No cp or sob. No cough or uri c/o. Denies hx pud or gallstones.      Past Medical History  Diagnosis Date  . Asthma   . Pancreatitis    History reviewed. No pertinent past surgical history. Family History  Problem Relation Age of Onset  . Stroke Mother   . Hypertension Mother   . Cancer Other   . Diabetes Other    History  Substance Use Topics  . Smoking status: Current Some Day Smoker    Types: Cigarettes  . Smokeless tobacco: Never Used  . Alcohol Use: No    Review of Systems  Constitutional: Negative for fever and chills.  HENT: Negative for sore throat.   Eyes: Negative for redness.  Respiratory: Negative for cough and shortness of breath.   Cardiovascular: Negative for chest pain.  Gastrointestinal: Positive for abdominal pain. Negative for vomiting, diarrhea and abdominal distention.  Genitourinary: Negative for dysuria, hematuria and flank pain.  Musculoskeletal: Negative for back pain and neck pain.  Skin: Negative for rash.  Neurological: Negative for headaches.  Hematological: Does not bruise/bleed easily.   Psychiatric/Behavioral: Negative for confusion.      Allergies  Aspirin and Ibuprofen  Home Medications   Prior to Admission medications   Medication Sig Start Date End Date Taking? Authorizing Provider  cyclobenzaprine (FLEXERIL) 10 MG tablet Take 1 tablet (10 mg total) by mouth 3 (three) times daily as needed for muscle spasms. Patient not taking: Reported on 02/08/2014 10/30/13   Patty Sermons Camprubi-Soms, PA-C  meclizine (ANTIVERT) 25 MG tablet Take 25 mg by mouth 3 (three) times daily as needed for nausea.    Historical Provider, MD  oxyCODONE-acetaminophen (PERCOCET/ROXICET) 5-325 MG per tablet Take 1 tablet by mouth every 8 (eight) hours as needed for moderate pain or severe pain. 02/08/14   Marissa Sciacca, PA-C  promethazine (PHENERGAN) 25 MG tablet Take 1 tablet (25 mg total) by mouth every 6 (six) hours as needed for nausea or vomiting. 01/18/14   Quintella Reichert, MD  traZODone (DESYREL) 100 MG tablet Take 100 mg by mouth at bedtime.    Historical Provider, MD   BP 112/74 mmHg  Pulse 69  Temp(Src) 98.3 F (36.8 C)  Resp 20  SpO2 100% Physical Exam  Constitutional: He is oriented to person, place, and time. He appears well-developed and well-nourished. No distress.  HENT:  Nose: Nose normal.  Mouth/Throat: Oropharynx is clear and moist.  Eyes: Conjunctivae are normal. No scleral icterus.  Neck: Neck supple. No tracheal deviation present.  Cardiovascular: Normal rate, regular rhythm, normal heart sounds and intact distal pulses.  Exam reveals no gallop  and no friction rub.   No murmur heard. Pulmonary/Chest: Effort normal and breath sounds normal. No accessory muscle usage. No respiratory distress.  Abdominal: Soft. Bowel sounds are normal. He exhibits no distension and no mass. There is tenderness. There is no rebound and no guarding.  Epigastric tenderness. No rebound or guarding. No hernia.   Genitourinary:  No cva tenderness  Musculoskeletal: Normal range of  motion.  Neurological: He is alert and oriented to person, place, and time.  Skin: Skin is warm and dry. No rash noted. He is not diaphoretic.  Psychiatric: He has a normal mood and affect.  Nursing note and vitals reviewed.   ED Course  Procedures (including critical care time) Labs Review  Results for orders placed or performed during the hospital encounter of 03/17/14  CBC with Differential  Result Value Ref Range   WBC 3.5 (L) 4.0 - 10.5 K/uL   RBC 4.93 4.22 - 5.81 MIL/uL   Hemoglobin 15.0 13.0 - 17.0 g/dL   HCT 44.0 39.0 - 52.0 %   MCV 89.2 78.0 - 100.0 fL   MCH 30.4 26.0 - 34.0 pg   MCHC 34.1 30.0 - 36.0 g/dL   RDW 13.9 11.5 - 15.5 %   Platelets 183 150 - 400 K/uL   Neutrophils Relative % 29 (L) 43 - 77 %   Neutro Abs 1.0 (L) 1.7 - 7.7 K/uL   Lymphocytes Relative 59 (H) 12 - 46 %   Lymphs Abs 2.0 0.7 - 4.0 K/uL   Monocytes Relative 12 3 - 12 %   Monocytes Absolute 0.4 0.1 - 1.0 K/uL   Eosinophils Relative 0 0 - 5 %   Eosinophils Absolute 0.0 0.0 - 0.7 K/uL   Basophils Relative 0 0 - 1 %   Basophils Absolute 0.0 0.0 - 0.1 K/uL  Comprehensive metabolic panel  Result Value Ref Range   Sodium 139 135 - 145 mmol/L   Potassium 4.2 3.5 - 5.1 mmol/L   Chloride 102 96 - 112 mmol/L   CO2 31 19 - 32 mmol/L   Glucose, Bld 106 (H) 70 - 99 mg/dL   BUN 5 (L) 6 - 23 mg/dL   Creatinine, Ser 1.06 0.50 - 1.35 mg/dL   Calcium 9.3 8.4 - 10.5 mg/dL   Total Protein 7.1 6.0 - 8.3 g/dL   Albumin 4.2 3.5 - 5.2 g/dL   AST 28 0 - 37 U/L   ALT 24 0 - 53 U/L   Alkaline Phosphatase 53 39 - 117 U/L   Total Bilirubin 0.4 0.3 - 1.2 mg/dL   GFR calc non Af Amer 83 (L) >90 mL/min   GFR calc Af Amer >90 >90 mL/min   Anion gap 6 5 - 15  Lipase, blood  Result Value Ref Range   Lipase 25 11 - 59 U/L  Urinalysis, Routine w reflex microscopic  Result Value Ref Range   Color, Urine YELLOW YELLOW   APPearance CLEAR CLEAR   Specific Gravity, Urine 1.018 1.005 - 1.030   pH 6.5 5.0 - 8.0    Glucose, UA NEGATIVE NEGATIVE mg/dL   Hgb urine dipstick NEGATIVE NEGATIVE   Bilirubin Urine NEGATIVE NEGATIVE   Ketones, ur NEGATIVE NEGATIVE mg/dL   Protein, ur NEGATIVE NEGATIVE mg/dL   Urobilinogen, UA 0.2 0.0 - 1.0 mg/dL   Nitrite NEGATIVE NEGATIVE   Leukocytes, UA NEGATIVE NEGATIVE     MDM   Labs.   pepcid and gi cocktail po.  Percocet po (pt says has ride, does  not have to drive).  Reviewed nursing notes and prior charts for additional history.   Recheck pt comfortable. Tolerating po fluids. No nv. Afeb. abd soft nt.  Pt appears stable for d/c.      Mirna Mires, MD 03/17/14 [phone]

## 2014-03-17 NOTE — ED Notes (Signed)
The pt is c/o abd pain and n v and diarrhea for 3 days.  He has a hx of pancreatitis.  Alcohol use years ago

## 2014-03-17 NOTE — Discharge Instructions (Signed)
It was our pleasure to provide your ER care today - we hope that you feel better.  Your lab work appears normal.   Take pepcid and maalox as need for symptom relief.  You may take hydrocodone as need for pain. No driving when taking hydrocodone. Also, do not take tylenol or acetaminophen containing medication when taking hydrocodone.  Follow up with primary care doctor in coming week if symptoms fail to improve/resolve.  Return to ER if worse, new symptoms, fevers, persistent vomiting, other concern.  You were given pain medication in the ER  - no driving for the next 4 hours.       Abdominal Pain Many things can cause abdominal pain. Usually, abdominal pain is not caused by a disease and will improve without treatment. It can often be observed and treated at home. Your health care provider will do a physical exam and possibly order blood tests and X-rays to help determine the seriousness of your pain. However, in many cases, more time must pass before a clear cause of the pain can be found. Before that point, your health care provider may not know if you need more testing or further treatment. HOME CARE INSTRUCTIONS  Monitor your abdominal pain for any changes. The following actions may help to alleviate any discomfort you are experiencing:  Only take over-the-counter or prescription medicines as directed by your health care provider.  Do not take laxatives unless directed to do so by your health care provider.  Try a clear liquid diet (broth, tea, or water) as directed by your health care provider. Slowly move to a bland diet as tolerated. SEEK MEDICAL CARE IF:  You have unexplained abdominal pain.  You have abdominal pain associated with nausea or diarrhea.  You have pain when you urinate or have a bowel movement.  You experience abdominal pain that wakes you in the night.  You have abdominal pain that is worsened or improved by eating food.  You have abdominal pain that  is worsened with eating fatty foods.  You have a fever. SEEK IMMEDIATE MEDICAL CARE IF:   Your pain does not go away within 2 hours.  You keep throwing up (vomiting).  Your pain is felt only in portions of the abdomen, such as the right side or the left lower portion of the abdomen.  You pass bloody or black tarry stools. MAKE SURE YOU:  Understand these instructions.   Will watch your condition.   Will get help right away if you are not doing well or get worse.  Document Released: 11/08/2004 Document Revised: 02/03/2013 Document Reviewed: 10/08/2012 [facility] Patient Information 2015 [place], [place]. This information is not intended to replace advice given to you by your health care provider. Make sure you discuss any questions you have with your health care provider.    Gastritis, Adult Gastritis is soreness and swelling (inflammation) of the lining of the stomach. Gastritis can develop as a sudden onset (acute) or long-term (chronic) condition. If gastritis is not treated, it can lead to stomach bleeding and ulcers. CAUSES  Gastritis occurs when the stomach lining is weak or damaged. Digestive juices from the stomach then inflame the weakened stomach lining. The stomach lining may be weak or damaged due to viral or bacterial infections. One common bacterial infection is the Helicobacter pylori infection. Gastritis can also result from excessive alcohol consumption, taking certain medicines, or having too much acid in the stomach.  SYMPTOMS  In some cases, there are no symptoms. When  symptoms are present, they may include:  Pain or a burning sensation in the upper abdomen.  Nausea.  Vomiting.  An uncomfortable feeling of fullness after eating. DIAGNOSIS  Your caregiver may suspect you have gastritis based on your symptoms and a physical exam. To determine the cause of your gastritis, your caregiver may perform the following:  Blood or stool tests to check for the H pylori  bacterium.  Gastroscopy. A thin, flexible tube (endoscope) is passed down the esophagus and into the stomach. The endoscope has a light and camera on the end. Your caregiver uses the endoscope to view the inside of the stomach.  Taking a tissue sample (biopsy) from the stomach to examine under a microscope. TREATMENT  Depending on the cause of your gastritis, medicines may be prescribed. If you have a bacterial infection, such as an H pylori infection, antibiotics may be given. If your gastritis is caused by too much acid in the stomach, H2 blockers or antacids may be given. Your caregiver may recommend that you stop taking aspirin, ibuprofen, or other nonsteroidal anti-inflammatory drugs (NSAIDs). HOME CARE INSTRUCTIONS  Only take over-the-counter or prescription medicines as directed by your caregiver.  If you were given antibiotic medicines, take them as directed. Finish them even if you start to feel better.  Drink enough fluids to keep your urine clear or pale yellow.  Avoid foods and drinks that make your symptoms worse, such as:  Caffeine or alcoholic drinks.  Chocolate.  Peppermint or mint flavorings.  Garlic and onions.  Spicy foods.  Citrus fruits, such as oranges, lemons, or limes.  Tomato-based foods such as sauce, chili, salsa, and pizza.  Fried and fatty foods.  Eat small, frequent meals instead of large meals. SEEK IMMEDIATE MEDICAL CARE IF:   You have black or dark red stools.  You vomit blood or material that looks like coffee grounds.  You are unable to keep fluids down.  Your abdominal pain gets worse.  You have a fever.  You do not feel better after 1 week.  You have any other questions or concerns. MAKE SURE YOU:  Understand these instructions.  Will watch your condition.  Will get help right away if you are not doing well or get worse. Document Released: 01/23/2001 Document Revised: 07/31/2011 Document Reviewed: 03/14/2011 [facility]  Patient Information 2015 [place], [place]. This information is not intended to replace advice given to you by your health care provider. Make sure you discuss any questions you have with your health care provider.

## 2014-04-06 ENCOUNTER — Encounter (HOSPITAL_COMMUNITY): Payer: Self-pay

## 2014-04-06 ENCOUNTER — Emergency Department (HOSPITAL_COMMUNITY)
Admission: EM | Admit: 2014-04-06 | Discharge: 2014-04-06 | Disposition: A | Discharge status: Home / Self Care | Payer: Medicare Other | Attending: Emergency Medicine | Admitting: Emergency Medicine

## 2014-04-06 DIAGNOSIS — Z720 Tobacco use: Secondary | ICD-10-CM | POA: Insufficient documentation

## 2014-04-06 DIAGNOSIS — Z8719 Personal history of other diseases of the digestive system: Secondary | ICD-10-CM | POA: Insufficient documentation

## 2014-04-06 DIAGNOSIS — Z79899 Other long term (current) drug therapy: Secondary | ICD-10-CM | POA: Insufficient documentation

## 2014-04-06 DIAGNOSIS — J45909 Unspecified asthma, uncomplicated: Secondary | ICD-10-CM | POA: Diagnosis not present

## 2014-04-06 DIAGNOSIS — R110 Nausea: Secondary | ICD-10-CM | POA: Diagnosis not present

## 2014-04-06 DIAGNOSIS — R1013 Epigastric pain: Secondary | ICD-10-CM | POA: Diagnosis not present

## 2014-04-06 DIAGNOSIS — R1033 Periumbilical pain: Secondary | ICD-10-CM | POA: Diagnosis present

## 2014-04-06 LAB — CBC WITH DIFFERENTIAL/PLATELET
Basophils Absolute: 0 10*3/uL (ref 0.0–0.1)
Basophils Relative: 0 % (ref 0–1)
Eosinophils Absolute: 0 10*3/uL (ref 0.0–0.7)
Eosinophils Relative: 1 % (ref 0–5)
HCT: 39.9 % (ref 39.0–52.0)
Hemoglobin: 14.2 g/dL (ref 13.0–17.0)
Lymphocytes Relative: 55 % — ABNORMAL HIGH (ref 12–46)
Lymphs Abs: 2.6 10*3/uL (ref 0.7–4.0)
MCH: 32.4 pg (ref 26.0–34.0)
MCHC: 35.6 g/dL (ref 30.0–36.0)
MCV: 91.1 fL (ref 78.0–100.0)
Monocytes Absolute: 0.5 10*3/uL (ref 0.1–1.0)
Monocytes Relative: 10 % (ref 3–12)
Neutro Abs: 1.6 10*3/uL — ABNORMAL LOW (ref 1.7–7.7)
Neutrophils Relative %: 34 % — ABNORMAL LOW (ref 43–77)
Platelets: 167 10*3/uL (ref 150–400)
RBC: 4.38 MIL/uL (ref 4.22–5.81)
RDW: 14.3 % (ref 11.5–15.5)
WBC: 4.8 10*3/uL (ref 4.0–10.5)

## 2014-04-06 LAB — URINALYSIS, ROUTINE W REFLEX MICROSCOPIC
Bilirubin Urine: NEGATIVE
Glucose, UA: NEGATIVE mg/dL
Hgb urine dipstick: NEGATIVE
Ketones, ur: NEGATIVE mg/dL
Leukocytes, UA: NEGATIVE
Nitrite: NEGATIVE
Protein, ur: NEGATIVE mg/dL
Specific Gravity, Urine: 1.024 (ref 1.005–1.030)
Urobilinogen, UA: 1 mg/dL (ref 0.0–1.0)
pH: 8 (ref 5.0–8.0)

## 2014-04-06 LAB — BASIC METABOLIC PANEL
Anion gap: 3 — ABNORMAL LOW (ref 5–15)
BUN: 11 mg/dL (ref 6–23)
CO2: 30 mmol/L (ref 19–32)
Calcium: 8.5 mg/dL (ref 8.4–10.5)
Chloride: 101 mmol/L (ref 96–112)
Creatinine, Ser: 1.06 mg/dL (ref 0.50–1.35)
GFR calc Af Amer: >90 mL/min (ref >90)
GFR calc non Af Amer: 83 mL/min — ABNORMAL LOW (ref >90)
Glucose, Bld: 97 mg/dL (ref 70–99)
Potassium: 4.2 mmol/L (ref 3.5–5.1)
Sodium: 134 mmol/L — ABNORMAL LOW (ref 135–145)

## 2014-04-06 LAB — AMYLASE: Amylase: 113 U/L — ABNORMAL HIGH (ref 0–105)

## 2014-04-06 LAB — LIPASE, BLOOD: Lipase: 20 U/L (ref 11–59)

## 2014-04-06 MED ORDER — HYDROMORPHONE HCL 2 MG/ML IJ SOLN
2.0000 mg | Freq: Once | INTRAMUSCULAR | Status: AC
Start: 2014-04-06 — End: 2014-04-06
  Administered 2014-04-06: 2 mg via INTRAMUSCULAR
  Filled 2014-04-06: qty 1

## 2014-04-06 MED ORDER — HYDROCODONE-ACETAMINOPHEN 5-325 MG PO TABS: 1.0000 | ORAL_TABLET | ORAL | Status: DC | PRN

## 2014-04-06 MED ORDER — ONDANSETRON 8 MG PO TBDP
8.0000 mg | ORAL_TABLET | Freq: Once | ORAL | Status: AC
  Administered 2014-04-06: 8 mg via ORAL
  Filled 2014-04-06: qty 1

## 2014-04-06 MED ORDER — ONDANSETRON HCL 4 MG PO TABS: 4.0000 mg | ORAL_TABLET | Freq: Four times a day (QID) | ORAL | Status: DC

## 2014-04-06 NOTE — ED Notes (Signed)
Pt complains of abd pain that radiates to the right side for three days

## 2014-04-06 NOTE — ED Provider Notes (Signed)
CSN: [id number]     Arrival date & time 04/06/14  0028 History   First MD Initiated Contact with Patient 04/06/14 0249     Chief Complaint  Patient presents with  . Abdominal Pain     (Consider location/radiation/quality/duration/timing/severity/associated sxs/prior Treatment) Patient is a 44 y.o. male presenting with abdominal pain. The history is provided by the patient. No language interpreter was used.  Abdominal Pain Pain location:  Periumbilical Pain quality: cramping and sharp   Pain radiates to:  Does not radiate Pain severity:  Severe Onset quality:  Gradual Duration:  2 days Associated symptoms: nausea   Associated symptoms: no chills, no diarrhea, no fever and no vomiting   Associated symptoms comment:  Abdominal pain that he reports is similar to previous episodes of pancreatitis. He denies continued alcohol use. No fever. He has had nausea without vomiting. No diarrhea.   Past Medical History  Diagnosis Date  . Asthma   . Pancreatitis    History reviewed. No pertinent past surgical history. Family History  Problem Relation Age of Onset  . Stroke Mother   . Hypertension Mother   . Cancer Other   . Diabetes Other    History  Substance Use Topics  . Smoking status: Current Some Day Smoker    Types: Cigarettes  . Smokeless tobacco: Never Used  . Alcohol Use: No    Review of Systems  Constitutional: Negative for fever and chills.  HENT: Negative.   Respiratory: Negative.   Cardiovascular: Negative.   Gastrointestinal: Positive for nausea and abdominal pain. Negative for vomiting and diarrhea.  Musculoskeletal: Negative.  Negative for back pain.  Skin: Negative.   Neurological: Negative.       Allergies  Aspirin and Ibuprofen  Home Medications   Prior to Admission medications   Medication Sig Start Date End Date Taking? Authorizing Provider  Cyanocobalamin (VITAMIN B-12 PO) Take 1 tablet by mouth daily.   Yes Historical Provider, MD  meclizine  (ANTIVERT) 25 MG tablet Take 25 mg by mouth 3 (three) times daily as needed for nausea.   Yes Historical Provider, MD  Multiple Vitamin (MULTIVITAMIN WITH MINERALS) TABS tablet Take 1 tablet by mouth daily. One-A-Day.   Yes Historical Provider, MD  traZODone (DESYREL) 100 MG tablet Take 100 mg by mouth at bedtime.   Yes Historical Provider, MD  cyclobenzaprine (FLEXERIL) 10 MG tablet Take 1 tablet (10 mg total) by mouth 3 (three) times daily as needed for muscle spasms. Patient not taking: Reported on 02/08/2014 10/30/13   Patty Sermons Camprubi-Soms, PA-C  HYDROcodone-acetaminophen (NORCO/VICODIN) 5-325 MG per tablet Take 1-2 tablets by mouth every 6 (six) hours as needed for moderate pain. Patient not taking: Reported on 04/06/2014 03/17/14   Mirna Mires, MD  oxyCODONE-acetaminophen (PERCOCET/ROXICET) 5-325 MG per tablet Take 1 tablet by mouth every 8 (eight) hours as needed for moderate pain or severe pain. Patient not taking: Reported on 04/06/2014 02/08/14   Marissa Sciacca, PA-C  promethazine (PHENERGAN) 25 MG tablet Take 1 tablet (25 mg total) by mouth every 6 (six) hours as needed for nausea or vomiting. Patient not taking: Reported on 04/06/2014 01/18/14   Quintella Reichert, MD   BP 128/75 mmHg  Pulse 65  Temp(Src) 97.6 F (36.4 C) (Oral)  Resp 19  SpO2 100% Physical Exam  Constitutional: He is oriented to person, place, and time. He appears well-developed and well-nourished.  HENT:  Head: Normocephalic.  Neck: Normal range of motion. Neck supple.  Cardiovascular: Normal rate and regular  rhythm.   Pulmonary/Chest: Effort normal and breath sounds normal.  Abdominal: Soft. Bowel sounds are normal. There is tenderness. There is no rebound and no guarding.  Diffusely tender abdomen that has greatest tenderness in the mid-upper abdomen. Nondistended.   Musculoskeletal: Normal range of motion.  Neurological: He is alert and oriented to person, place, and time.  Skin: Skin is warm and dry.  No rash noted.  Psychiatric: He has a normal mood and affect.    ED Course  Procedures (including critical care time) Labs Review Labs Reviewed  AMYLASE - Abnormal; Notable for the following:    Amylase 113 (*)    All other components within normal limits  BASIC METABOLIC PANEL - Abnormal; Notable for the following:    Sodium 134 (*)    GFR calc non Af Amer 83 (*)    Anion gap 3 (*)    All other components within normal limits  CBC WITH DIFFERENTIAL/PLATELET - Abnormal; Notable for the following:    Neutrophils Relative % 34 (*)    Neutro Abs 1.6 (*)    Lymphocytes Relative 55 (*)    All other components within normal limits  LIPASE, BLOOD  URINALYSIS, ROUTINE W REFLEX MICROSCOPIC    Imaging Review No results found.   EKG Interpretation None      MDM   Final diagnoses:  None    1. Abdominal pain  Normal lab studies tonight. No fever. Pain improved with medications. Will provide PCP referral list for outpatient follow up.    Dewaine Oats, PA-C 04/06/14 [place], MD 04/06/14 [phone]

## 2014-04-06 NOTE — Discharge Instructions (Signed)

## 2014-05-26 ENCOUNTER — Emergency Department (HOSPITAL_COMMUNITY)
Admission: EM | Admit: 2014-05-26 | Discharge: 2014-05-27 | Disposition: A | Discharge status: Home / Self Care | Payer: Medicare Other | Attending: Emergency Medicine | Admitting: Emergency Medicine

## 2014-05-26 ENCOUNTER — Emergency Department (HOSPITAL_COMMUNITY): Payer: Medicare Other

## 2014-05-26 ENCOUNTER — Encounter (HOSPITAL_COMMUNITY): Payer: Self-pay | Admitting: *Deleted

## 2014-05-26 DIAGNOSIS — R1013 Epigastric pain: Secondary | ICD-10-CM | POA: Insufficient documentation

## 2014-05-26 DIAGNOSIS — R112 Nausea with vomiting, unspecified: Secondary | ICD-10-CM | POA: Diagnosis present

## 2014-05-26 DIAGNOSIS — R1084 Generalized abdominal pain: Secondary | ICD-10-CM | POA: Diagnosis not present

## 2014-05-26 DIAGNOSIS — Z8719 Personal history of other diseases of the digestive system: Secondary | ICD-10-CM | POA: Insufficient documentation

## 2014-05-26 DIAGNOSIS — Z79899 Other long term (current) drug therapy: Secondary | ICD-10-CM | POA: Diagnosis not present

## 2014-05-26 DIAGNOSIS — R197 Diarrhea, unspecified: Secondary | ICD-10-CM | POA: Diagnosis not present

## 2014-05-26 DIAGNOSIS — G8929 Other chronic pain: Secondary | ICD-10-CM | POA: Insufficient documentation

## 2014-05-26 DIAGNOSIS — R109 Unspecified abdominal pain: Secondary | ICD-10-CM

## 2014-05-26 DIAGNOSIS — J45909 Unspecified asthma, uncomplicated: Secondary | ICD-10-CM | POA: Diagnosis not present

## 2014-05-26 DIAGNOSIS — Z720 Tobacco use: Secondary | ICD-10-CM | POA: Diagnosis not present

## 2014-05-26 LAB — COMPREHENSIVE METABOLIC PANEL
ALT: 16 U/L (ref 0–53)
AST: 21 U/L (ref 0–37)
Albumin: 3.6 g/dL (ref 3.5–5.2)
Alkaline Phosphatase: 46 U/L (ref 39–117)
Anion gap: 7 (ref 5–15)
BUN: 8 mg/dL (ref 6–23)
CO2: 28 mmol/L (ref 19–32)
Calcium: 8.9 mg/dL (ref 8.4–10.5)
Chloride: 102 mmol/L (ref 96–112)
Creatinine, Ser: 1.17 mg/dL (ref 0.50–1.35)
GFR calc Af Amer: 85 mL/min — ABNORMAL LOW (ref >90)
GFR calc non Af Amer: 74 mL/min — ABNORMAL LOW (ref >90)
Glucose, Bld: 81 mg/dL (ref 70–99)
Potassium: 3.8 mmol/L (ref 3.5–5.1)
Sodium: 137 mmol/L (ref 135–145)
Total Bilirubin: 0.4 mg/dL (ref 0.3–1.2)
Total Protein: 6.7 g/dL (ref 6.0–8.3)

## 2014-05-26 LAB — CBC WITH DIFFERENTIAL/PLATELET
Basophils Absolute: 0 10*3/uL (ref 0.0–0.1)
Basophils Relative: 1 % (ref 0–1)
Eosinophils Absolute: 0 10*3/uL (ref 0.0–0.7)
Eosinophils Relative: 0 % (ref 0–5)
HCT: 42.9 % (ref 39.0–52.0)
Hemoglobin: 14.6 g/dL (ref 13.0–17.0)
Lymphocytes Relative: 63 % — ABNORMAL HIGH (ref 12–46)
Lymphs Abs: 2.7 10*3/uL (ref 0.7–4.0)
MCH: 30.6 pg (ref 26.0–34.0)
MCHC: 34 g/dL (ref 30.0–36.0)
MCV: 89.9 fL (ref 78.0–100.0)
Monocytes Absolute: 0.3 10*3/uL (ref 0.1–1.0)
Monocytes Relative: 8 % (ref 3–12)
Neutro Abs: 1.2 10*3/uL — ABNORMAL LOW (ref 1.7–7.7)
Neutrophils Relative %: 28 % — ABNORMAL LOW (ref 43–77)
Platelets: 178 10*3/uL (ref 150–400)
RBC: 4.77 MIL/uL (ref 4.22–5.81)
RDW: 13.9 % (ref 11.5–15.5)
WBC: 4.3 10*3/uL (ref 4.0–10.5)

## 2014-05-26 LAB — LIPASE, BLOOD: Lipase: 20 U/L (ref 11–59)

## 2014-05-26 MED ORDER — SODIUM CHLORIDE 0.9 % IV BOLUS (SEPSIS)
2000.0000 mL | Freq: Once | INTRAVENOUS | Status: AC
  Administered 2014-05-26: 2000 mL via INTRAVENOUS

## 2014-05-26 MED ORDER — ONDANSETRON 4 MG PO TBDP
8.0000 mg | ORAL_TABLET | Freq: Once | ORAL | Status: AC
  Administered 2014-05-26: 8 mg via ORAL
  Filled 2014-05-26: qty 2

## 2014-05-26 MED ORDER — HYDROMORPHONE HCL 1 MG/ML IJ SOLN
1.0000 mg | Freq: Once | INTRAMUSCULAR | Status: AC
  Administered 2014-05-26: 1 mg via INTRAVENOUS
  Filled 2014-05-26: qty 1

## 2014-05-26 NOTE — ED Provider Notes (Signed)
CSN: [id number]     Arrival date & time 05/26/14  1902 History   First MD Initiated Contact with Patient 05/26/14 2148     Chief Complaint  Patient presents with  . Emesis     (Consider location/radiation/quality/duration/timing/severity/associated sxs/prior Treatment) HPI [name] is a 44 year old male past medical history of pancreatitis who presents the ER complaining of epigastric pain, nausea, vomiting, diarrhea. Patient states his symptoms began gradually over the past 4 days, and patient states she has been able to keep food or fluids down. Past 24 hours. Patient describes a burning sensation in his epigastrium, which he states feels consistent and identical to previous episodes of pancreatitis. Patient denies drinking alcohol the past several days. Patient denies fever, chills, chest pain, shortness of breath, headache, blurred vision, dizziness, weakness, dysuria.  Past Medical History  Diagnosis Date  . Asthma   . Pancreatitis    History reviewed. No pertinent past surgical history. Family History  Problem Relation Age of Onset  . Stroke Mother   . Hypertension Mother   . Cancer Other   . Diabetes Other    History  Substance Use Topics  . Smoking status: Current Some Day Smoker    Types: Cigarettes  . Smokeless tobacco: Never Used  . Alcohol Use: No    Review of Systems  Constitutional: Negative for fever.  HENT: Negative for trouble swallowing.   Eyes: Negative for visual disturbance.  Respiratory: Negative for shortness of breath.   Cardiovascular: Negative for chest pain.  Gastrointestinal: Positive for nausea, vomiting, abdominal pain and diarrhea.  Genitourinary: Negative for dysuria.  Musculoskeletal: Negative for neck pain.  Skin: Negative for rash.  Neurological: Negative for dizziness, weakness and numbness.  Psychiatric/Behavioral: Negative.       Allergies  Aspirin; Ibuprofen; and Vicodin  Home Medications   Prior to Admission  medications   Medication Sig Start Date End Date Taking? Authorizing Provider  Multiple Vitamin (MULTIVITAMIN WITH MINERALS) TABS tablet Take 1 tablet by mouth daily. One-A-Day.   Yes Historical Provider, MD  ranitidine (ZANTAC) 150 MG tablet Take 150 mg by mouth daily as needed for heartburn.   Yes Historical Provider, MD  Cyanocobalamin (VITAMIN B-12 PO) Take 1 tablet by mouth daily.    Historical Provider, MD  cyclobenzaprine (FLEXERIL) 10 MG tablet Take 1 tablet (10 mg total) by mouth 3 (three) times daily as needed for muscle spasms. Patient not taking: Reported on 02/08/2014 10/30/13   Mercedes Camprubi-Soms, PA-C  HYDROcodone-acetaminophen (NORCO/VICODIN) 5-325 MG per tablet Take 1-2 tablets by mouth every 4 (four) hours as needed. Patient not taking: Reported on 05/26/2014 04/06/14   Charlann Lange, PA-C  ondansetron (ZOFRAN) 4 MG tablet Take 1 tablet (4 mg total) by mouth every 6 (six) hours. 05/27/14   Dahlia Bailiff, PA-C  oxyCODONE-acetaminophen (PERCOCET/ROXICET) 5-325 MG per tablet Take 1 tablet by mouth every 8 (eight) hours as needed for moderate pain or severe pain. Patient not taking: Reported on 04/06/2014 02/08/14   Marissa Sciacca, PA-C  promethazine (PHENERGAN) 25 MG tablet Take 1 tablet (25 mg total) by mouth every 6 (six) hours as needed for nausea or vomiting. Patient not taking: Reported on 04/06/2014 01/18/14   Quintella Reichert, MD   BP 111/72 mmHg  Pulse 57  Temp(Src) 98.8 F (37.1 C)  Resp 16  Ht 6\' 2"  (1.88 m)  Wt 180 lb (81.647 kg)  BMI 23.10 kg/m2  SpO2 100% Physical Exam  Constitutional: He is oriented to person, place, and time. He appears  well-developed and well-nourished. No distress.  HENT:  Head: Normocephalic and atraumatic.  Mouth/Throat: Oropharynx is clear and moist. No oropharyngeal exudate.  Eyes: Right eye exhibits no discharge. Left eye exhibits no discharge. No scleral icterus.  Neck: Normal range of motion.  Cardiovascular: Normal rate, regular rhythm  and normal heart sounds.   No murmur heard. Pulmonary/Chest: Effort normal and breath sounds normal. No respiratory distress.  Abdominal: Soft. There is generalized tenderness. There is no rigidity, no guarding, no tenderness at McBurney's point and negative Murphy's sign.  Generalized, diffuse tenderness noted more prominently in epigastrium.  Musculoskeletal: Normal range of motion. He exhibits no edema or tenderness.  Neurological: He is alert and oriented to person, place, and time. He has normal strength. No cranial nerve deficit. Coordination normal. GCS eye subscore is 4. GCS verbal subscore is 5. GCS motor subscore is 6.  Patient fully alert, answering questions appropriately in full, clear sentences. Cranial nerves II through XII grossly intact. Motor strength 5 out of 5 in all major muscle groups of upper and lower extremities. Distal sensation intact.   Skin: Skin is warm and dry. No rash noted. He is not diaphoretic.  Psychiatric: He has a normal mood and affect.  Nursing note and vitals reviewed.   ED Course  Procedures (including critical care time) Labs Review Labs Reviewed  CBC WITH DIFFERENTIAL/PLATELET - Abnormal; Notable for the following:    Neutrophils Relative % 28 (*)    Neutro Abs 1.2 (*)    Lymphocytes Relative 63 (*)    All other components within normal limits  COMPREHENSIVE METABOLIC PANEL - Abnormal; Notable for the following:    GFR calc non Af Amer 74 (*)    GFR calc Af Amer 85 (*)    All other components within normal limits  LIPASE, BLOOD  URINALYSIS, ROUTINE W REFLEX MICROSCOPIC    Imaging Review Dg Abd Acute W/chest  05/27/2014   CLINICAL DATA:  Abdominal pain and nausea  EXAM: DG ABDOMEN ACUTE W/ 1V CHEST  COMPARISON:  01/18/2014  FINDINGS: There is no evidence of dilated bowel loops or free intraperitoneal air. No radiopaque calculi or other significant radiographic abnormality is seen. Heart size and mediastinal contours are within normal limits.  When accounting for nipple shadows, both lungs are clear.  IMPRESSION: Negative abdominal radiographs.  No acute cardiopulmonary disease.   Electronically Signed   By: Monte Fantasia M.D.   On: 05/27/2014 00:52     EKG Interpretation None      MDM   Final diagnoses:  Epigastric pain  Chronic abdominal pain    Patient here with signs and symptoms consistent with his ongoing, chronic abdominal pain. Patient is nontoxic, nonseptic appearing, in no apparent distress.  Patient's pain and other symptoms adequately managed in emergency department.  Fluid bolus given.  Labs, imaging and vitals reviewed.  Patient does not meet the SIRS or Sepsis criteria.  On repeat exam patient does not have a surgical abdomin and there are no peritoneal signs.  No indication of appendicitis, bowel obstruction, bowel perforation, cholecystitis, diverticulitis.  Patient discharged home with symptomatic treatment and given strict instructions for follow-up with their primary care physician.  I have also discussed reasons to return immediately to the ER.  Patient expresses understanding and agrees with plan. I encouraged patient to call or return to the ER if any worsening of symptoms or should he have any questions or concerns.  BP 111/72 mmHg  Pulse 57  Temp(Src) 98.8 F (37.1  C)  Resp 16  Ht 6\' 2"  (1.88 m)  Wt 180 lb (81.647 kg)  BMI 23.10 kg/m2  SpO2 100%  Signed,  Dahlia Bailiff, PA-C 1:09 AM  Patient discussed with Dr. Davonna Belling, MD       Dahlia Bailiff, PA-C 05/27/14 0109  Davonna Belling, MD 05/29/14 1250

## 2014-05-26 NOTE — ED Notes (Signed)
The pt is c/o abd cramps with nv and diarrhea fior 4 days.  Hx oancreatitis

## 2014-05-26 NOTE — ED Notes (Signed)
The pt denies drinking alcohol in the oast few days

## 2014-05-27 LAB — URINALYSIS, ROUTINE W REFLEX MICROSCOPIC
Bilirubin Urine: NEGATIVE
Glucose, UA: NEGATIVE mg/dL
Hgb urine dipstick: NEGATIVE
Ketones, ur: NEGATIVE mg/dL
Leukocytes, UA: NEGATIVE
Nitrite: NEGATIVE
Protein, ur: NEGATIVE mg/dL
Specific Gravity, Urine: 1.025 (ref 1.005–1.030)
Urobilinogen, UA: 1 mg/dL (ref 0.0–1.0)
pH: 6.5 (ref 5.0–8.0)

## 2014-05-27 MED ORDER — ONDANSETRON HCL 4 MG PO TABS: 4.0000 mg | ORAL_TABLET | Freq: Four times a day (QID) | ORAL | Status: DC

## 2014-05-27 NOTE — Discharge Instructions (Signed)
Abdominal Pain Many things can cause abdominal pain. Usually, abdominal pain is not caused by a disease and will improve without treatment. It can often be observed and treated at home. Your health care provider will do a physical exam and possibly order blood tests and X-rays to help determine the seriousness of your pain. However, in many cases, more time must pass before a clear cause of the pain can be found. Before that point, your health care provider may not know if you need more testing or further treatment. HOME CARE INSTRUCTIONS  Monitor your abdominal pain for any changes. The following actions may help to alleviate any discomfort you are experiencing:  Only take over-the-counter or prescription medicines as directed by your health care provider.  Do not take laxatives unless directed to do so by your health care provider.  Try a clear liquid diet (broth, tea, or water) as directed by your health care provider. Slowly move to a bland diet as tolerated. SEEK MEDICAL CARE IF:  You have unexplained abdominal pain.  You have abdominal pain associated with nausea or diarrhea.  You have pain when you urinate or have a bowel movement.  You experience abdominal pain that wakes you in the night.  You have abdominal pain that is worsened or improved by eating food.  You have abdominal pain that is worsened with eating fatty foods.  You have a fever. SEEK IMMEDIATE MEDICAL CARE IF:   Your pain does not go away within 2 hours.  You keep throwing up (vomiting).  Your pain is felt only in portions of the abdomen, such as the right side or the left lower portion of the abdomen.  You pass bloody or black tarry stools. MAKE SURE YOU:  Understand these instructions.   Will watch your condition.   Will get help right away if you are not doing well or get worse.  Document Released: 11/08/2004 Document Revised: 02/03/2013 Document Reviewed: 10/08/2012 [facility] Patient Information  2015 [place], [place]. This information is not intended to replace advice given to you by your health care provider. Make sure you discuss any questions you have with your health care provider.   Emergency Department Resource Guide 1) Find a Doctor and Pay Out of Pocket Although you won't have to find out who is covered by your insurance plan, it is a good idea to ask around and get recommendations. You will then need to call the office and see if the doctor you have chosen will accept you as a new patient and what types of options they offer for patients who are self-pay. Some doctors offer discounts or will set up payment plans for their patients who do not have insurance, but you will need to ask so you aren't surprised when you get to your appointment.  2) Contact Your Local Health Department Not all health departments have doctors that can see patients for sick visits, but many do, so it is worth a call to see if yours does. If you don't know where your local health department is, you can check in your phone book. The CDC also has a tool to help you locate your state's health department, and many state websites also have listings of all of their local health departments.  3) Find a [facility] If your illness is not likely to be very severe or complicated, you may want to try a walk in clinic. These are popping up all over the country in pharmacies, drugstores, and shopping centers. They're  usually staffed by nurse practitioners or physician assistants that have been trained to treat common illnesses and complaints. They're usually fairly quick and inexpensive. However, if you have serious medical issues or chronic medical problems, these are probably not your best option.  No Primary Care Doctor: - Call Health Connect at  [phone] - they can help you locate a primary care doctor that  accepts your insurance, provides certain services, etc. - Physician Referral Service- [phone]  Chronic  Pain Problems: Organization         Address  Phone   Notes  [facility]  [phone] Patients need to be referred by their primary care doctor.   Medication Assistance: Organization         Address  Phone   Notes  [facility] Medication [facility] [address] [place], [place] [address] [phone] --Must be a resident of [facility] -- Must have NO insurance coverage whatsoever (no Medicaid/ Medicare, etc.) -- The pt. MUST have a primary care doctor that directs their care regularly and follows them in the community   [facility]ist  [phone]   [employer]  [phone]    Agencies that provide inexpensive medical care: Organization         Address  Phone   Notes  [place]  [phone]   [facility]    [phone]   [facility] [place], [place] [address] [phone]   [place] [address] [place] [phone]   Planned Parenthood    [phone]   [facility]    [phone]   [place] and [place] [address], [place] Phone:  [phone], Fax:  [phone] Hours of Operation:  9 am - 6 pm, M-F.  Also accepts Medicaid/Medicare and self-pay.  [facility] for [place], [address], [place] Phone: [phone], Fax: [phone]. Hours of Operation:  8:30 am - 5:30 pm, M-F.  Also accepts Medicaid and self-pay.  [facility] [address], [place] Phone: [phone]   [place], [place], [place] [phone], Ext. [phone] Mondays & Thursdays: 7-9 AM.  First 15 patients are seen on a first come, first serve basis.    [place] Providers:  Organization         Address  Phone   Notes  [facility] [address], [place] [phone] Also  accepts self-pay patients.  [facility] [id number] [place], [place]  [phone]   [place], [address], [place] [phone]   [facility] [address] [place] [phone]   Lucianne Lei [address], [place]   [phone] Only accepts [place] Access [place] patients after they have their name applied to their card.   Self-Pay (no insurance) in [facility]:  Organization         Address  Phone   Notes  Sickle Cell Patients, [facility] Internal Medicine [place] [phone]   [facility] [place] [phone]   [facility]gent [place]  [place], [address], [place] [phone]   [facility]/Dr. Osei-Bonsu  [address], [place] or [address], [place] [phone] Phone number for both [place] and [place] locations is the same.  [facility] [address], [place] [phone]   [facility] [address], [place] or [address] [phone]   [facility] [address] [place] [phone], phone; [phone], fax Sees patients 1st and 3rd Saturday of every month.  Must not qualify for public or private insurance (i.e. Medicaid, Medicare, [place] Health Choice, Veterans' Benefits)  Household income should be no more than 200% of the poverty level The clinic cannot treat you if you are pregnant or think you are pregnant  Sexually transmitted diseases are not treated at the clinic.    Dental Care: Organization         Address  Phone  Notes  [facility] of [facility] [place] [phone] Accepts children up to age 33 who are enrolled in [place] or [place]; pregnant  women with a Medicaid card; and children who have applied for Medicaid or [place] Health Choice, but were declined, whose parents can pay a reduced fee at time of service.  [facility] Department of [facility]  [address], [place] [phone] Accepts children up to age 48 who are enrolled in [place] or [place]; pregnant women with a Medicaid card; and children who have applied for Medicaid or [place] Health Choice, but were declined, whose parents can pay a reduced fee at time of service.  [place] Adult Dental Access PROGRAM  [place] [phone] Patients are seen by appointment only. Walk-ins are not accepted. [place] will see patients 42 years of age and older. Monday - Tuesday (8am-5pm) Most Wednesdays (8:30-5pm) $30 per visit, cash only  [facility] Adult Dental Access PROGRAM  [address], [facility] [phone] Patients are seen by appointment only. Walk-ins are not accepted. [place] will see patients 31 years of age and older. One Wednesday Evening (Monthly: Volunteer Based).  $30 per visit, cash only  [place]  [phone] for adults; Children under age 65, call Graduate Pediatric Dentistry at [phone]. Children aged 5-14, please call [phone] to request a pediatric application.  Dental services are provided in all areas of dental care including fillings, crowns and bridges, complete and partial dentures, implants, gum treatment, root canals, and extractions. Preventive care is also provided. Treatment is provided to both adults and children. Patients are selected via a lottery and there is often a waiting list.   [facility] [address], [place]  [phone] [email or web address]   [facility] [address] [place], [place] [phone], Ext. [phone] Second and Fourth Thursday of each month, opens at 6:30 AM; Clinic ends at 9 AM.  Patients are  seen on a first-come first-served basis, and a limited number are seen during each clinic.   [facility]  [address] Hillard Danker [place], [place] [phone]   Eligibility Requirements You must have lived in [place], [place], or [place] counties for at least the last three months.   You cannot be eligible for state or federal sponsored [employer], including [employer], [place], or [employer].   You generally cannot be eligible for healthcare insurance through  your employer.    How to apply: Eligibility screenings are held every Tuesday and Wednesday afternoon from 1:00 pm until 4:00 pm. You do not need an appointment for the interview!  [facility] [address] [place], [place]   [place]  [place] Department  [place]  [phone]    Behavioral Health Resources in the Community: Intensive Outpatient Programs Organization         Address  Phone  Notes  [place]. [address] [place], [place] [phone]   [facility] Outpatient [address] [place], [place]   ADS: Alcohol & Drug Svcs [address], [place], [place]   [place] [address],  [place], [place] or [phone]   Substance Abuse Resources Organization         Address  Phone  Notes  Alcohol and Drug Services  [phone]   [place]  [phone]   [place]   Chinita Pester  [phone]   Residential & Outpatient Substance Abuse Program  [phone]   Psychological Services Organization         Address  Phone  Notes  [facility] [place]  [place]  [phone]   [place] [address], [place] or [phone]    Mobile Crisis  Teams Organization         Address  Phone  Notes  [facility], Mobile Crisis Care Unit  [phone]   Assertive Psychotherapeutic Services  [address] [place], [place]   [place] [address], [place] [phone]    Self-Help/Support Groups Organization         Address  Phone             Notes  [place]. of [place] - variety of support groups  [place] Call for more information  Narcotics Anonymous (NA), [facility] [address], [employer] [place]  2 meetings at this location   Special educational needs teacher         Address  Phone  Notes  ASAP Residential Treatment [place],    [place]  [phone]   [facility]  [address], [place] [phone], [place], [place]   [place], [place] [phone] Admissions: 8am-3pm M-F  Incentives Substance [place] [address],    [place], [place] [phone]   [facility] [address] [place], [place], [place]   [facility] [address],  [place], [place]   [facility] - Intensive Outpatient [address], [place], [place]   [facility].,  [place], [place] [phone] or [phone]   [facility] Services (RTS) [address] [place], [place] Accepts Medicaid  [facility] [place] [address],  [place] [phone] Substance Abuse/Addiction Treatment   [facility] Organization         Address  Phone  Notes  [facility]  [phone]   Domenic Schwab, PhD [address] [place], [place]   [phone] or [phone]   [place], [place] [phone]  [facility] [address], [place], [place] [phone]  Insurance/Medicaid/sponsorship through [facility]  °[facility] [address]                                    [place], [place] [phone] Therapy/tele-psych/case  °[facility] [address]  ° [place], [place] [phone]    °Dr. Arfeen  ([phone]   °[facility] [place]  [employer] [place] [facility] 1) [address], [place] °2) [address], [place] °3)  [address], [place] [phone]) [phone] °([phone]) [phone] ° °([phone]) [phone]   °[place] Child Abuse Hotline ([phone]) [phone] or ([phone]) [phone] (After Hours)    ° ° ° ° °

## 2014-06-16 ENCOUNTER — Encounter: Payer: Self-pay | Admitting: Nurse Practitioner

## 2014-07-01 ENCOUNTER — Encounter: Payer: Self-pay | Admitting: *Deleted

## 2014-07-02 ENCOUNTER — Encounter: Payer: Self-pay | Admitting: Nurse Practitioner

## 2014-07-02 ENCOUNTER — Ambulatory Visit (INDEPENDENT_AMBULATORY_CARE_PROVIDER_SITE_OTHER): Payer: Medicare Other | Admitting: Nurse Practitioner

## 2014-07-02 VITALS — BP 114/68 | HR 72 | Ht 74.0 in | Wt 178.2 lb

## 2014-07-02 DIAGNOSIS — G8929 Other chronic pain: Secondary | ICD-10-CM | POA: Diagnosis not present

## 2014-07-02 DIAGNOSIS — R1013 Epigastric pain: Secondary | ICD-10-CM | POA: Diagnosis not present

## 2014-07-02 MED ORDER — PANCRELIPASE (LIP-PROT-AMYL) 36000-114000 UNITS PO CPEP: ORAL_CAPSULE | ORAL | Status: DC

## 2014-07-02 NOTE — Progress Notes (Signed)
HPI :  Patient is a 44 year old male, former patient of Dr. [name]. He had a colonoscopy for weight loss and abdominal pain in November 2007. Multiple polyps were removed., Polyps were hyperplastic. Terminal ileal biopsies unremarkable as well. Upper endoscopy done same time was normal as well including small bowel biopsies. His abdominal pain raised suspicion of chronic pancreatitis. EUS findings not definitive but raised question of mild chronic pancreatitis. He hasn't had ETOH in 2 years.   Patient has not been seen here in several years. He is referred by Dr. Rex Kras. Patient's pain meds have apparently been handled by [facility], sounds like he has been on chronic Morphine. Patient has been seen in ED several times for abdominal pain. Last ED visit was in April and labs were unremarkable. Abdominal films unremarkable.   Past Medical History  Diagnosis Date  . Asthma   . Pancreatitis   . Rectal polyp 01/09/2006    Hyperplastic   Family History  Problem Relation Age of Onset  . Stroke Mother   . Hypertension Mother   . Cancer Other   . Diabetes Other    History  Substance Use Topics  . Smoking status: Current Some Day Smoker    Types: Cigarettes  . Smokeless tobacco: Never Used  . Alcohol Use: No   Current Outpatient Prescriptions  Medication Sig Dispense Refill  . Cyanocobalamin (VITAMIN B-12 PO) Take 1 tablet by mouth daily.    . Multiple Vitamin (MULTIVITAMIN WITH MINERALS) TABS tablet Take 1 tablet by mouth daily. One-A-Day.    . ondansetron (ZOFRAN) 4 MG tablet Take 1 tablet (4 mg total) by mouth every 6 (six) hours. 12 tablet 0  . oxyCODONE-acetaminophen (PERCOCET/ROXICET) 5-325 MG per tablet Take 1 tablet by mouth every 8 (eight) hours as needed for moderate pain or severe pain. 5 tablet 0  . promethazine (PHENERGAN) 25 MG tablet Take 1 tablet (25 mg total) by mouth every 6 (six) hours as needed for nausea or vomiting. 10 tablet 0  . ranitidine (ZANTAC) 150 MG  tablet Take 150 mg by mouth daily as needed for heartburn.    . traZODone (DESYREL) 50 MG tablet Take 50 mg by mouth at bedtime.    . lipase/protease/amylase (CREON) 36000 UNITS CPEP capsule Take 2 tab with snacks and 3 tab with 390 capsule 1   No current facility-administered medications for this visit.   Allergies  Allergen Reactions  . Aspirin Shortness Of Breath and Swelling  . Ibuprofen Shortness Of Breath and Swelling  . Vicodin [Hydrocodone-Acetaminophen] Hives and Itching   Review of Systems: All systems reviewed and negative except where noted in HPI.   Physical Exam: BP 114/68 mmHg  Pulse 72  Ht 6\' 2"  (0.17 m)  Wt 178 lb 4 oz (80.854 kg)  BMI 22.88 kg/m2 Constitutional: Pleasant,well-developed, black male in no acute distress. HEENT: Normocephalic and atraumatic. Conjunctivae are normal. No scleral icterus. Neck supple.  Cardiovascular: Normal rate, regular rhythm.  Pulmonary/chest: Effort normal and breath sounds normal. No wheezing, rales or rhonchi. Abdominal: Soft, nondistended, nontender. Bowel sounds active throughout. There are no masses palpable. No hepatomegaly. Extremities: no edema Lymphadenopathy: No cervical adenopathy noted. Neurological: Alert and oriented to person place and time. Skin: Skin is warm and dry. No rashes noted. Psychiatric: Normal mood and affect. Behavior is normal.   ASSESSMENT AND PLAN:  44 year old male with chronic abdominal pain, nausea and vomiting. EUS by Dr. [name] in 2008 raised question of mild chronic pancreatitis. Patient has  not been seen here in years. Patient tells me he takes pancreatic enzymes but doesn't know the name of dosage and I have no records from PCP. Patient called his pharmacy, no record of pancreatic enzymes dispensed.  I do think that pancreatic enzymes may be beneficial. Will try Creon, 3 with meals and 2 with snacks. I explained to the patient that we do not manage chronic pain with narcotics. We will refer  him to pain management. Will request PCP records and have patient follow up with Dr. [name] in 6-8 weeks

## 2014-07-02 NOTE — Patient Instructions (Signed)
We sent a prescription to [facility]. 1. Creon- Pancreatic Enzyme.  We are making a referral to [facility]. We will notify you at a later date regarding this appointment.

## 2014-07-05 NOTE — Progress Notes (Signed)
i agree with the above note, plan 

## 2014-07-22 ENCOUNTER — Encounter (HOSPITAL_COMMUNITY): Payer: Self-pay

## 2014-07-22 ENCOUNTER — Emergency Department (HOSPITAL_COMMUNITY)
Admission: EM | Admit: 2014-07-22 | Discharge: 2014-07-23 | Disposition: A | Discharge status: Home / Self Care | Payer: Medicare Other | Attending: Emergency Medicine | Admitting: Emergency Medicine

## 2014-07-22 DIAGNOSIS — Z720 Tobacco use: Secondary | ICD-10-CM | POA: Insufficient documentation

## 2014-07-22 DIAGNOSIS — Z8719 Personal history of other diseases of the digestive system: Secondary | ICD-10-CM | POA: Insufficient documentation

## 2014-07-22 DIAGNOSIS — R1013 Epigastric pain: Secondary | ICD-10-CM | POA: Insufficient documentation

## 2014-07-22 DIAGNOSIS — Z79899 Other long term (current) drug therapy: Secondary | ICD-10-CM | POA: Diagnosis not present

## 2014-07-22 DIAGNOSIS — J45909 Unspecified asthma, uncomplicated: Secondary | ICD-10-CM | POA: Insufficient documentation

## 2014-07-22 DIAGNOSIS — G8929 Other chronic pain: Secondary | ICD-10-CM

## 2014-07-22 LAB — COMPREHENSIVE METABOLIC PANEL
ALT: 12 U/L — ABNORMAL LOW (ref 17–63)
AST: 17 U/L (ref 15–41)
Albumin: 3.4 g/dL — ABNORMAL LOW (ref 3.5–5.0)
Alkaline Phosphatase: 47 U/L (ref 38–126)
Anion gap: 6 (ref 5–15)
BUN: 12 mg/dL (ref 6–20)
CO2: 27 mmol/L (ref 22–32)
Calcium: 8.4 mg/dL — ABNORMAL LOW (ref 8.9–10.3)
Chloride: 104 mmol/L (ref 101–111)
Creatinine, Ser: 1.04 mg/dL (ref 0.61–1.24)
GFR calc Af Amer: >60 mL/min (ref >60)
GFR calc non Af Amer: >60 mL/min (ref >60)
Glucose, Bld: 93 mg/dL (ref 65–99)
Potassium: 3.8 mmol/L (ref 3.5–5.1)
Sodium: 137 mmol/L (ref 135–145)
Total Bilirubin: 0.2 mg/dL — ABNORMAL LOW (ref 0.3–1.2)
Total Protein: 6 g/dL — ABNORMAL LOW (ref 6.5–8.1)

## 2014-07-22 LAB — URINALYSIS, ROUTINE W REFLEX MICROSCOPIC
Bilirubin Urine: NEGATIVE
Glucose, UA: NEGATIVE mg/dL
Hgb urine dipstick: NEGATIVE
Ketones, ur: NEGATIVE mg/dL
Leukocytes, UA: NEGATIVE
Nitrite: NEGATIVE
Protein, ur: NEGATIVE mg/dL
Specific Gravity, Urine: 1.011 (ref 1.005–1.030)
Urobilinogen, UA: 1 mg/dL (ref 0.0–1.0)
pH: 6 (ref 5.0–8.0)

## 2014-07-22 LAB — CBC WITH DIFFERENTIAL/PLATELET
Basophils Absolute: 0 10*3/uL (ref 0.0–0.1)
Basophils Relative: 0 % (ref 0–1)
Eosinophils Absolute: 0 10*3/uL (ref 0.0–0.7)
Eosinophils Relative: 1 % (ref 0–5)
HCT: 36.8 % — ABNORMAL LOW (ref 39.0–52.0)
Hemoglobin: 12.5 g/dL — ABNORMAL LOW (ref 13.0–17.0)
Lymphocytes Relative: 64 % — ABNORMAL HIGH (ref 12–46)
Lymphs Abs: 3 10*3/uL (ref 0.7–4.0)
MCH: 30.9 pg (ref 26.0–34.0)
MCHC: 34 g/dL (ref 30.0–36.0)
MCV: 91.1 fL (ref 78.0–100.0)
Monocytes Absolute: 0.5 10*3/uL (ref 0.1–1.0)
Monocytes Relative: 11 % (ref 3–12)
Neutro Abs: 1.1 10*3/uL — ABNORMAL LOW (ref 1.7–7.7)
Neutrophils Relative %: 24 % — ABNORMAL LOW (ref 43–77)
Platelets: 172 10*3/uL (ref 150–400)
RBC: 4.04 MIL/uL — ABNORMAL LOW (ref 4.22–5.81)
RDW: 13.6 % (ref 11.5–15.5)
WBC: 4.7 10*3/uL (ref 4.0–10.5)

## 2014-07-22 LAB — LIPASE, BLOOD: Lipase: 19 U/L — ABNORMAL LOW (ref 22–51)

## 2014-07-22 MED ORDER — SODIUM CHLORIDE 0.9 % IV BOLUS (SEPSIS)
1000.0000 mL | Freq: Once | INTRAVENOUS | Status: AC
  Administered 2014-07-23: 1000 mL via INTRAVENOUS

## 2014-07-22 MED ORDER — HYDROMORPHONE HCL 1 MG/ML IJ SOLN
1.0000 mg | Freq: Once | INTRAMUSCULAR | Status: AC
  Administered 2014-07-23: 1 mg via INTRAVENOUS
  Filled 2014-07-22: qty 1

## 2014-07-22 MED ORDER — ONDANSETRON HCL 4 MG/2ML IJ SOLN
4.0000 mg | Freq: Once | INTRAMUSCULAR | Status: AC
  Administered 2014-07-23: 4 mg via INTRAVENOUS
  Filled 2014-07-22: qty 2

## 2014-07-22 NOTE — ED Notes (Signed)
Pt complains of severe abdominal pain since earlier today

## 2014-07-23 MED ORDER — OXYCODONE-ACETAMINOPHEN 5-325 MG PO TABS: 1.0000 | ORAL_TABLET | Freq: Four times a day (QID) | ORAL | Status: DC | PRN

## 2014-07-23 NOTE — Discharge Instructions (Signed)
Return here as needed.  Follow-up with the GI Dr. provided °

## 2014-07-23 NOTE — ED Provider Notes (Signed)
CSN: [id number]     Arrival date & time 07/22/14  2213 History   First MD Initiated Contact with Patient 07/22/14 2259     Chief Complaint  Patient presents with  . Abdominal Pain     (Consider location/radiation/quality/duration/timing/severity/associated sxs/prior Treatment) HPI Patient presents to the emergency department with mid abdominal pain that is chronic in nature.  The patient states that he has had chronic pancreatitis for quite a while.  He states that this feels like his typical flare of pancreatitis where he gets mid abdominal pain that radiates to his back.  Patient does, nausea, vomiting, weakness, dizziness, headache, blurred vision, fever, chest pain, shortness of breath or syncope.  The patient states that he does not currently drink alcohol Past Medical History  Diagnosis Date  . Asthma   . Pancreatitis   . Rectal polyp 01/09/2006    Hyperplastic   History reviewed. No pertinent past surgical history. Family History  Problem Relation Age of Onset  . Stroke Mother   . Hypertension Mother   . Cancer Other   . Diabetes Other    History  Substance Use Topics  . Smoking status: Current Some Day Smoker    Types: Cigarettes  . Smokeless tobacco: Never Used  . Alcohol Use: No    Review of Systems All other systems negative except as documented in the HPI. All pertinent positives and negatives as reviewed in the HPI.   Allergies  Aspirin; Ibuprofen; and Vicodin  Home Medications   Prior to Admission medications   Medication Sig Start Date End Date Taking? Authorizing Provider  [facility] OMEPRAZOLE 20 MG TBEC Take 1 tablet by mouth daily. 06/16/14  Yes Historical Provider, MD  Cyanocobalamin (VITAMIN B-12 PO) Take 1 tablet by mouth daily.   Yes Historical Provider, MD  lipase/protease/amylase (CREON) 36000 UNITS CPEP capsule Take 2 tab with snacks and 3 tab with Patient taking differently: Take 36,000 Units by mouth 3 (three) times daily before meals. Take 2 capsule  with lunch and dinner  and 1 capsule with a snack. 07/02/14  Yes Willia Craze, NP  Multiple Vitamin (MULTIVITAMIN WITH MINERALS) TABS tablet Take 1 tablet by mouth daily. One-A-Day.   Yes Historical Provider, MD  ondansetron (ZOFRAN) 4 MG tablet Take 1 tablet (4 mg total) by mouth every 6 (six) hours. 05/27/14  Yes Dahlia Bailiff, PA-C  oxyCODONE-acetaminophen (PERCOCET/ROXICET) 5-325 MG per tablet Take 1 tablet by mouth every 8 (eight) hours as needed for moderate pain or severe pain. 02/08/14  Yes Marissa Sciacca, PA-C  ranitidine (ZANTAC) 150 MG tablet Take 150 mg by mouth daily as needed for heartburn.   Yes Historical Provider, MD  traZODone (DESYREL) 50 MG tablet Take 50 mg by mouth at bedtime.   Yes Historical Provider, MD  promethazine (PHENERGAN) 25 MG tablet Take 1 tablet (25 mg total) by mouth every 6 (six) hours as needed for nausea or vomiting. Patient not taking: Reported on 07/22/2014 01/18/14   Quintella Reichert, MD   BP 108/75 mmHg  Pulse 65  Temp(Src) 98.1 F (36.7 C) (Oral)  Resp 18  Ht 6\' 2"  (1.88 m)  Wt 180 lb (81.647 kg)  BMI 23.10 kg/m2  SpO2 100% Physical Exam  Constitutional: He is oriented to person, place, and time. He appears well-developed and well-nourished. No distress.  HENT:  Head: Normocephalic and atraumatic.  Mouth/Throat: Oropharynx is clear and moist.  Eyes: Pupils are equal, round, and reactive to light.  Neck: Normal range of motion. Neck supple.  Cardiovascular: Normal rate, regular rhythm and normal heart sounds.  Exam reveals no gallop and no friction rub.   No murmur heard. Pulmonary/Chest: Effort normal and breath sounds normal. No respiratory distress.  Abdominal: Soft. Bowel sounds are normal. He exhibits no distension. There is tenderness. There is no rebound and no guarding.  Neurological: He is alert and oriented to person, place, and time. He exhibits normal muscle tone. Coordination normal.  Skin: Skin is warm and dry. No rash noted. No  erythema.  Nursing note and vitals reviewed.   ED Course  Procedures (including critical care time) Labs Review Labs Reviewed  CBC WITH DIFFERENTIAL/PLATELET - Abnormal; Notable for the following:    RBC 4.04 (*)    Hemoglobin 12.5 (*)    HCT 36.8 (*)    Neutrophils Relative % 24 (*)    Neutro Abs 1.1 (*)    Lymphocytes Relative 64 (*)    All other components within normal limits  COMPREHENSIVE METABOLIC PANEL - Abnormal; Notable for the following:    Calcium 8.4 (*)    Total Protein 6.0 (*)    Albumin 3.4 (*)    ALT 12 (*)    Total Bilirubin 0.2 (*)    All other components within normal limits  LIPASE, BLOOD - Abnormal; Notable for the following:    Lipase 19 (*)    All other components within normal limits  URINALYSIS, ROUTINE W REFLEX MICROSCOPIC (NOT AT [facility]) - Abnormal; Notable for the following:    APPearance CLOUDY (*)    All other components within normal limits   Patient will be treated for his pain and referred to GI.  Told to return here as needed.  Patient agrees the plan and all questions were answered    Dalia Heading, PA-C 07/23/14 0110  Merryl Hacker, MD 07/23/14 0600

## 2014-09-16 ENCOUNTER — Encounter (HOSPITAL_COMMUNITY): Payer: Self-pay | Admitting: Emergency Medicine

## 2014-09-16 ENCOUNTER — Emergency Department (HOSPITAL_COMMUNITY)
Admission: EM | Admit: 2014-09-16 | Discharge: 2014-09-17 | Disposition: A | Discharge status: Home / Self Care | Payer: Medicare Other | Attending: Emergency Medicine | Admitting: Emergency Medicine

## 2014-09-16 DIAGNOSIS — K859 Acute pancreatitis, unspecified: Secondary | ICD-10-CM | POA: Diagnosis not present

## 2014-09-16 DIAGNOSIS — Z720 Tobacco use: Secondary | ICD-10-CM | POA: Diagnosis not present

## 2014-09-16 DIAGNOSIS — Z79899 Other long term (current) drug therapy: Secondary | ICD-10-CM | POA: Diagnosis not present

## 2014-09-16 DIAGNOSIS — R1013 Epigastric pain: Secondary | ICD-10-CM | POA: Insufficient documentation

## 2014-09-16 DIAGNOSIS — J45909 Unspecified asthma, uncomplicated: Secondary | ICD-10-CM | POA: Diagnosis not present

## 2014-09-16 DIAGNOSIS — G8929 Other chronic pain: Secondary | ICD-10-CM | POA: Diagnosis not present

## 2014-09-16 DIAGNOSIS — R1084 Generalized abdominal pain: Secondary | ICD-10-CM | POA: Diagnosis present

## 2014-09-16 LAB — COMPREHENSIVE METABOLIC PANEL WITH GFR
ALT: 14 U/L — ABNORMAL LOW (ref 17–63)
AST: 20 U/L (ref 15–41)
Albumin: 3.6 g/dL (ref 3.5–5.0)
Alkaline Phosphatase: 43 U/L (ref 38–126)
Anion gap: 6 (ref 5–15)
BUN: 7 mg/dL (ref 6–20)
CO2: 25 mmol/L (ref 22–32)
Calcium: 8.7 mg/dL — ABNORMAL LOW (ref 8.9–10.3)
Chloride: 101 mmol/L (ref 101–111)
Creatinine, Ser: 1.1 mg/dL (ref 0.61–1.24)
GFR calc Af Amer: >60 mL/min (ref >60)
GFR calc non Af Amer: >60 mL/min (ref >60)
Glucose, Bld: 105 mg/dL — ABNORMAL HIGH (ref 65–99)
Potassium: 3.6 mmol/L (ref 3.5–5.1)
Sodium: 132 mmol/L — ABNORMAL LOW (ref 135–145)
Total Bilirubin: 0.4 mg/dL (ref 0.3–1.2)
Total Protein: 6.5 g/dL (ref 6.5–8.1)

## 2014-09-16 LAB — URINALYSIS, ROUTINE W REFLEX MICROSCOPIC
Glucose, UA: NEGATIVE mg/dL
Hgb urine dipstick: NEGATIVE
Ketones, ur: 15 mg/dL — AB
Leukocytes, UA: NEGATIVE
Nitrite: NEGATIVE
Protein, ur: NEGATIVE mg/dL
Specific Gravity, Urine: 1.031 — ABNORMAL HIGH (ref 1.005–1.030)
Urobilinogen, UA: 1 mg/dL (ref 0.0–1.0)
pH: 6.5 (ref 5.0–8.0)

## 2014-09-16 LAB — CBC
HCT: 38.3 % — ABNORMAL LOW (ref 39.0–52.0)
Hemoglobin: 13.4 g/dL (ref 13.0–17.0)
MCH: 30.9 pg (ref 26.0–34.0)
MCHC: 35 g/dL (ref 30.0–36.0)
MCV: 88.2 fL (ref 78.0–100.0)
Platelets: 180 K/uL (ref 150–400)
RBC: 4.34 MIL/uL (ref 4.22–5.81)
RDW: 13.5 % (ref 11.5–15.5)
WBC: 4.1 K/uL (ref 4.0–10.5)

## 2014-09-16 LAB — LIPASE, BLOOD: Lipase: 20 U/L — ABNORMAL LOW (ref 22–51)

## 2014-09-16 MED ORDER — ONDANSETRON 4 MG PO TBDP
4.0000 mg | ORAL_TABLET | Freq: Once | ORAL | Status: AC
  Administered 2014-09-17: 4 mg via ORAL
  Filled 2014-09-16: qty 1

## 2014-09-16 MED ORDER — GI COCKTAIL ~~LOC~~
30.0000 mL | Freq: Once | ORAL | Status: AC
  Administered 2014-09-17: 30 mL via ORAL
  Filled 2014-09-16: qty 30

## 2014-09-16 MED ORDER — DICYCLOMINE HCL 10 MG PO CAPS
10.0000 mg | ORAL_CAPSULE | Freq: Once | ORAL | Status: AC
  Administered 2014-09-17: 10 mg via ORAL
  Filled 2014-09-16: qty 1

## 2014-09-16 NOTE — ED Notes (Signed)
Pt. reports mid abdominal pain with nausea , " heartburn" and emesis onset this week . Denies diarrhea / no fever or chills.

## 2014-09-16 NOTE — ED Provider Notes (Signed)
CSN: [id number]     Arrival date & time 09/16/14  2102 History   First MD Initiated Contact with Patient 09/16/14 2342     Chief Complaint  Patient presents with  . Abdominal Pain     (Consider location/radiation/quality/duration/timing/severity/associated sxs/prior Treatment) HPI   44 year old male with history of chronic pancreatitis, asthma and prior abdominal hernia repair presenting for evaluation of abdominal pain. Patient reports developing gradual and onset of persistent constant sharp diffuse abdominal pain which radiates to his back for the past 4 days. Pain is currently 10 out of 10, he endorsed very nauseous, dry heaving, and occasional having loose stools. He is afraid to eat as it worsened his pain. He denies any specific treatment tried. Pain felt similar to prior abdominal pain that he had in the past. He now endorse burning sensation to his sternum to his throat after vomiting. Occasional vomiting is nonbloody nonbilious. Reports stool was nonbloody non-mucousy. Denies having fever, chills, headache, chest pain, shortness of breath, productive cough or hemoptysis, back pain, dysuria, hematuria, hematochezia or melena. He denies consuming alcohol or using street drugs. He reported being evaluated for this in the past without any successful treatment. Denies any recent travel or eating exotic food.  Past Medical History  Diagnosis Date  . Asthma   . Pancreatitis   . Rectal polyp 01/09/2006    Hyperplastic   Past Surgical History  Procedure Laterality Date  . Hernia repair    . Arm surgery     Family History  Problem Relation Age of Onset  . Stroke Mother   . Hypertension Mother   . Cancer Other   . Diabetes Other    History  Substance Use Topics  . Smoking status: Current Some Day Smoker    Types: Cigarettes  . Smokeless tobacco: Never Used  . Alcohol Use: No    Review of Systems  All other systems reviewed and are negative.     Allergies  Aspirin;  Ibuprofen; and Vicodin  Home Medications   Prior to Admission medications   Medication Sig Start Date End Date Taking? Authorizing Provider  [facility] OMEPRAZOLE 20 MG TBEC Take 1 tablet by mouth daily. 06/16/14   Historical Provider, MD  Cyanocobalamin (VITAMIN B-12 PO) Take 1 tablet by mouth daily.    Historical Provider, MD  lipase/protease/amylase (CREON) 36000 UNITS CPEP capsule Take 2 tab with snacks and 3 tab with Patient taking differently: Take 36,000 Units by mouth 3 (three) times daily before meals. Take 2 capsule with lunch and dinner  and 1 capsule with a snack. 07/02/14   Willia Craze, NP  Multiple Vitamin (MULTIVITAMIN WITH MINERALS) TABS tablet Take 1 tablet by mouth daily. One-A-Day.    Historical Provider, MD  ondansetron (ZOFRAN) 4 MG tablet Take 1 tablet (4 mg total) by mouth every 6 (six) hours. 05/27/14   Dahlia Bailiff, PA-C  oxyCODONE-acetaminophen (PERCOCET/ROXICET) 5-325 MG per tablet Take 1 tablet by mouth every 6 (six) hours as needed for severe pain. 07/23/14   Dalia Heading, PA-C  promethazine (PHENERGAN) 25 MG tablet Take 1 tablet (25 mg total) by mouth every 6 (six) hours as needed for nausea or vomiting. Patient not taking: Reported on 07/22/2014 01/18/14   Quintella Reichert, MD  ranitidine (ZANTAC) 150 MG tablet Take 150 mg by mouth daily as needed for heartburn.    Historical Provider, MD  traZODone (DESYREL) 50 MG tablet Take 50 mg by mouth at bedtime.    Historical Provider, MD   BP  145/76 mmHg  Pulse 74  Temp(Src) 97.5 F (36.4 C) (Oral)  Resp 20  Wt 177 lb (80.287 kg)  SpO2 99% Physical Exam  Constitutional: He is oriented to person, place, and time. He appears well-developed and well-nourished. No distress.  African-American male, appears to be in no acute discomfort, nontoxic  HENT:  Head: Atraumatic.  Mouth/Throat: Oropharynx is clear and moist.  Eyes: Conjunctivae are normal.  Neck: Neck supple.  Cardiovascular: Normal rate, regular rhythm and intact  distal pulses.   Pulmonary/Chest: Effort normal and breath sounds normal. No respiratory distress. He has no wheezes. He has no rales. He exhibits no tenderness.  Abdominal: Soft. Bowel sounds are normal. He exhibits no distension. There is tenderness (Diffuse abdominal tenderness without guarding or rebound tenderness.). There is no rebound and no guarding.  Neurological: He is alert and oriented to person, place, and time.  Skin: No rash noted.  Psychiatric: He has a normal mood and affect.  Nursing note and vitals reviewed.   ED Course  Procedures (including critical care time)  Patient presents with acute on chronic abdominal pain. History of chronic hepatitis. Pain similar to prior. He is afebrile with stable normal vital signs and no hypoxia. Labs are reassuring, no leukocytosis, and normal lipase. His electrolytes are reassuring. Urine shows no evidence of urinary tract infection or blood concerning for kidney stones. He has had a normal abdominal pelvic CT scan a year ago and numerous imaging prior.  At this time I do not think advanced imaging is indicated. I felt patient will benefit from reevaluating by his GI specialist or primary care provider for his chronic pain. Plan to give patient Bentyl, GI cocktail, and Zofran. Patient will be discharge once able to tolerates by mouth. Patient voiced understanding and agrees with plan.   Labs Review Labs Reviewed  LIPASE, BLOOD - Abnormal; Notable for the following:    Lipase 20 (*)    All other components within normal limits  COMPREHENSIVE METABOLIC PANEL - Abnormal; Notable for the following:    Sodium 132 (*)    Glucose, Bld 105 (*)    Calcium 8.7 (*)    ALT 14 (*)    All other components within normal limits  CBC - Abnormal; Notable for the following:    HCT 38.3 (*)    All other components within normal limits  URINALYSIS, ROUTINE W REFLEX MICROSCOPIC (NOT AT [facility]) - Abnormal; Notable for the following:    Color, Urine AMBER  (*)    APPearance CLOUDY (*)    Specific Gravity, Urine 1.031 (*)    Bilirubin Urine SMALL (*)    Ketones, ur 15 (*)    All other components within normal limits    Imaging Review No results found.   EKG Interpretation None      MDM   Final diagnoses:  Abdominal pain, chronic, epigastric    BP 128/84 mmHg  Pulse 60  Temp(Src) 97.5 F (36.4 C) (Oral)  Resp 20  Wt 177 lb (80.287 kg)  SpO2 100%     Domenic Moras, PA-C 09/17/14 0118  Noemi Chapel, MD 09/17/14 [phone]

## 2014-09-17 MED ORDER — PROMETHAZINE HCL 25 MG PO TABS: 25.0000 mg | ORAL_TABLET | Freq: Four times a day (QID) | ORAL | Status: DC | PRN

## 2014-09-17 MED ORDER — ONDANSETRON HCL 4 MG/2ML IJ SOLN
4.0000 mg | Freq: Once | INTRAMUSCULAR | Status: AC
  Administered 2014-09-17: 4 mg via INTRAVENOUS
  Filled 2014-09-17: qty 2

## 2014-09-17 MED ORDER — SODIUM CHLORIDE 0.9 % IV BOLUS (SEPSIS)
1000.0000 mL | Freq: Once | INTRAVENOUS | Status: AC
  Administered 2014-09-17: 1000 mL via INTRAVENOUS

## 2014-09-17 MED ORDER — DICYCLOMINE HCL 20 MG PO TABS: 20.0000 mg | ORAL_TABLET | Freq: Two times a day (BID) | ORAL | Status: DC

## 2014-09-17 NOTE — ED Notes (Signed)
Pt tolerated a few sips of sprite

## 2014-09-17 NOTE — Discharge Instructions (Signed)
Please follow up with GI specialist for further management of your recurrent abdominal pain, which is likely due to chronic pancreatitis  Chronic Pain Chronic pain can be defined as pain that is off and on and lasts for 3-6 months or longer. Many things cause chronic pain, which can make it difficult to make a diagnosis. There are many treatment options available for chronic pain. However, finding a treatment that works well for you may require trying various approaches until the right one is found. Many people benefit from a combination of two or more types of treatment to control their pain. SYMPTOMS  Chronic pain can occur anywhere in the body and can range from mild to very severe. Some types of chronic pain include:  Headache.  Low back pain.  Cancer pain.  Arthritis pain.  Neurogenic pain. This is pain resulting from damage to nerves. People with chronic pain may also have other symptoms such as:  Depression.  Anger.  Insomnia.  Anxiety. DIAGNOSIS  Your health care provider will help diagnose your condition over time. In many cases, the initial focus will be on excluding possible conditions that could be causing the pain. Depending on your symptoms, your health care provider may order tests to diagnose your condition. Some of these tests may include:   Blood tests.   CT scan.   MRI.   X-rays.   Ultrasounds.   Nerve conduction studies.  You may need to see a specialist.  TREATMENT  Finding treatment that works well may take time. You may be referred to a pain specialist. He or she may prescribe medicine or therapies, such as:   Mindful meditation or yoga.  Shots (injections) of numbing or pain-relieving medicines into the spine or area of pain.  Local electrical stimulation.  Acupuncture.   Massage therapy.   Aroma, color, light, or sound therapy.   Biofeedback.   Working with a physical therapist to keep from getting stiff.   Regular,  gentle exercise.   Cognitive or behavioral therapy.   Group support.  Sometimes, surgery may be recommended.  HOME CARE INSTRUCTIONS   Take all medicines as directed by your health care provider.   Lessen stress in your life by relaxing and doing things such as listening to calming music.   Exercise or be active as directed by your health care provider.   Eat a healthy diet and include things such as vegetables, fruits, fish, and lean meats in your diet.   Keep all follow-up appointments with your health care provider.   Attend a support group with others suffering from chronic pain. SEEK MEDICAL CARE IF:   Your pain gets worse.   You develop a new pain that was not there before.   You cannot tolerate medicines given to you by your health care provider.   You have new symptoms since your last visit with your health care provider.  SEEK IMMEDIATE MEDICAL CARE IF:   You feel weak.   You have decreased sensation or numbness.   You lose control of bowel or bladder function.   Your pain suddenly gets much worse.   You develop shaking.  You develop chills.  You develop confusion.  You develop chest pain.  You develop shortness of breath.  MAKE SURE YOU:  Understand these instructions.  Will watch your condition.  Will get help right away if you are not doing well or get worse. Document Released: 10/21/2001 Document Revised: 10/01/2012 Document Reviewed: 07/25/2012 [facility] Patient Information 2015 [employer]. This information is not intended to replace advice given to you by your health care provider. Make sure you discuss any questions you have with your health care provider.

## 2014-09-17 NOTE — ED Notes (Signed)
Pt stable, ambulatory, states understanding of discharge instructions 

## 2014-09-17 NOTE — ED Provider Notes (Signed)
The patient is a 44 year old male, he has a long-time history of pancreatitis that date back to 2008, he states that this was thought to be related to alcohol use though he states he has not had alcohol in a long time and even at the time of diagnosis he was only drinking intermittently and small amounts. He still has a gallbladder, has not had abdominal surgery. His abdominal pain started approximately 4 days ago, he points to the periumbilical and lower abdominal region, he has been vomiting every time he tries to eat, not being able to hold down food or fluids is made him feel generally weak but he denies urinary symptoms or diarrhea. On exam the patient has a mild diffusely tender abdomen without guarding hepatosplenomegaly or peritoneal signs. He has clear heart and lung sounds without tachycardia, moist mucous membranes, no peripheral edema. He does appear nauseated, he would benefit from IV fluids, antiemetics and some medications for symptomatically control of his nonspecific abdominal pain. Labs do not show any signs of elevated lipase, he has normal liver function, renal function and blood counts and his urinalysis shows small amount of ketones but no infection. The patient is in agreement with this plan. We will avoid opiates at this time.  Medical screening examination/treatment/procedure(s) were conducted as a shared visit with non-physician practitioner(s) and myself.  I personally evaluated the patient during the encounter.  Clinical Impression:   Final diagnoses:  Abdominal pain, chronic, epigastric         Noemi Chapel, MD 09/17/14 1724

## 2014-12-15 ENCOUNTER — Ambulatory Visit (INDEPENDENT_AMBULATORY_CARE_PROVIDER_SITE_OTHER): Payer: Medicare Other | Admitting: Gastroenterology

## 2014-12-15 ENCOUNTER — Encounter: Payer: Self-pay | Admitting: Gastroenterology

## 2014-12-15 VITALS — BP 98/60 | HR 62 | Ht 74.0 in | Wt 179.0 lb

## 2014-12-15 DIAGNOSIS — G8929 Other chronic pain: Secondary | ICD-10-CM

## 2014-12-15 DIAGNOSIS — R109 Unspecified abdominal pain: Secondary | ICD-10-CM | POA: Diagnosis not present

## 2014-12-15 NOTE — Progress Notes (Signed)
Review of pertinent gastrointestinal problems: 1. Chronic abdominal pains.  Former Dr. Sharlett Maddox patient; CT scan 2008 with scattered RP LNs underwent EUS 2008 Dr. Ardis Maddox, node sampled and was bening.  REpeat EUS at request of Dr. Sharlett Maddox for suspicion of chronic pancreatitis, findings on EUS showed mild changes that may be from CP, underwent CPN.  Was put 'empirically' on PERT 07/2014.  Repeat ED visits, 2016 for abd pain, never elevated LFTS, CBC or pancreatic enzymes. 2. Hiatal hernia noted EGD Dr. Sharlett Maddox 12/2005, he questioned if severe GERD (NERD?) could be causing the symptoms. 3. Routine risk for colon cancer;  Dr. Sharlett Maddox colonoscopy for weight loss and abdominal pain in November 2007. Multiple polyps were removed., Polyps were hyperplastic. Terminal ileal biopsies unremarkable as well  HPI: This is a   very pleasant 44 year old man whom I last saw about 8 years ago time of an upper endoscopy with ultrasound. See that results summarized above  Chief complaint is continued severe abdominal pains, nausea, vomiting.  I note he has been in the emergency room several times the past year or 2 with complaints of the same  He has creon, needs to pick them up at the pharmacy.  Stays very contipated.  Never take nsaids.  He was asking about prescription for narcotic pain medicines  He tells me he has been losing a lot of weight however when I compare his weight today versus 6 months ago on the same scale here in GI he is actually up 1 pound.  He smokes cigarettes but does not drink alcohol.  Past Medical History  Diagnosis Date  . Asthma   . Pancreatitis   . Rectal polyp 01/09/2006    Hyperplastic    Past Surgical History  Procedure Laterality Date  . Hernia repair    . Arm surgery      Current Outpatient Prescriptions  Medication Sig Dispense Refill  . [facility] OMEPRAZOLE 20 MG TBEC Take 1 tablet by mouth daily.  2  . Cyanocobalamin (VITAMIN B-12 PO) Take 1 tablet by mouth  daily.    [name] dicyclomine (BENTYL) 20 MG tablet Take 1 tablet (20 mg total) by mouth 2 (two) times daily. 20 tablet 0  . lipase/protease/amylase (CREON) 36000 UNITS CPEP capsule Take 2 tab with snacks and 3 tab with (Patient taking differently: Take 36,000 Units by mouth 3 (three) times daily before meals. Take 2 capsule with lunch and dinner  and 1 capsule with a snack.) 390 capsule 1  . Multiple Vitamin (MULTIVITAMIN WITH MINERALS) TABS tablet Take 1 tablet by mouth daily. One-A-Day.    . ondansetron (ZOFRAN) 4 MG tablet Take 1 tablet (4 mg total) by mouth every 6 (six) hours. 12 tablet 0  . oxyCODONE-acetaminophen (PERCOCET/ROXICET) 5-325 MG per tablet Take 1 tablet by mouth every 6 (six) hours as needed for severe pain. 15 tablet 0  . promethazine (PHENERGAN) 25 MG tablet Take 1 tablet (25 mg total) by mouth every 6 (six) hours as needed for nausea or vomiting. 10 tablet 0  . ranitidine (ZANTAC) 150 MG tablet Take 150 mg by mouth daily as needed for heartburn.    . traZODone (DESYREL) 50 MG tablet Take 50 mg by mouth at bedtime.     No current facility-administered medications for this visit.    Allergies as of 12/15/2014 - Review Complete 12/15/2014  Allergen Reaction Noted  . Aspirin Shortness Of Breath and Swelling 01/12/2009  . Ibuprofen Shortness Of Breath and Swelling 01/12/2009  . Vicodin [hydrocodone-acetaminophen] Hives  and Itching 05/26/2014    Family History  Problem Relation Age of Onset  . Stroke Mother   . Hypertension Mother   . Cancer Other   . Diabetes Other     Social History   Social History  . Marital Status: Single    Spouse Name: N/A  . Number of Children: N/A  . Years of Education: N/A   Occupational History  . Not on file.   Social History Main Topics  . Smoking status: Current Some Day Smoker    Types: Cigarettes  . Smokeless tobacco: Never Used  . Alcohol Use: No  . Drug Use: No  . Sexual Activity: Not on file   Other Topics Concern  . Not  on file   Social History Narrative     Physical Exam: BP 98/60 mmHg  Pulse 62  Ht 6\' 2"  (1.88 m)  Wt 179 lb (81.194 kg)  BMI 22.97 kg/m2 Constitutional: generally well-appearing Psychiatric: alert and oriented x3 Abdomen: soft, nontender, nondistended, no obvious ascites, no peritoneal signs, normal bowel sounds   Assessment and plan: 44 y.o. male with chronic abdominal pains  He has been told that he may have chronic pancreatitis however it is not clear to me that that is truly the case. No imaging study has ever shown clear evidence of calcific pancreatitis. Endoscopic ultrasound 2008 suggested some mild changes however the sensitivity and specificity of endoscopic ultrasound diagnosing chronic pancreatitis is low especially when the findings are mild. He does not have any sign of pancreatic insufficiency. I cannot tell that he is actually taking his pancreas enzyme supplement. He says he is going to pick up at the pharmacy tomorrow. He was asking for narcotic pain medicines. I'm suspicious of drug-seeking behavior. It is been over a year since he's had good imaging of his abdomen and going to repeat CT scan abdomen and pelvis. Perhaps there will be some more definitive explanation for his abdominal pains on this. I recommended he go ahead and start those pancreas enzyme supplements that were called in for him 6 months ago. We will refer for a pain clinic visit.  Lastly I'm going to have him get fecal elastase level checked, this is a fairly good test to diagnose pancreatic insufficiency thickened associated with pancreatic calcifications, chronic pancreatitis.   Robert Loffler, MD [facility] 12/15/2014, 9:03 AM

## 2014-12-15 NOTE — Addendum Note (Signed)
Addended by: Gerlean Ren on: 12/15/2014 01:23 PM   Modules accepted: Orders

## 2014-12-15 NOTE — Patient Instructions (Addendum)
One of your biggest health concerns is your smoking.  This increases your risk for most cancers and serious cardiovascular diseases such as strokes, heart attacks.  You should try your best to stop.  If you need assistance, please contact your PCP or Smoking Cessation Class at [facility] [phone]) or [place] (1-800-QUIT-NOW).  Smoking is also well known to cause damage to your pancreas. You will be set up for a CT scan of abdomen and pelvis with IV and oral contrast. You will have labs checked today in the basement lab.  Please head down after you check out with the front desk  (fecal elastase). Pain clinic referral for chronic abdominal pain. Pick up your pancreatic enzymes and start taking daily with meals  You have been scheduled for a CT scan of the abdomen and pelvis at [facility] ([address]---this is in the same building as Press [facility]).   You are scheduled on  12-21-14 at 11:00am You should arrive 15 minutes prior to your appointment time for registration. Please follow the written instructions below on the day of your exam:  WARNING: IF YOU ARE ALLERGIC TO IODINE/X-RAY DYE, PLEASE NOTIFY RADIOLOGY IMMEDIATELY AT [phone]! YOU WILL BE GIVEN A 13 HOUR PREMEDICATION PREP.  1) Do not eat or drink anything after (4 hours prior to your test) 2) You have been given 2 bottles of oral contrast to drink. The solution may taste               better if refrigerated, but do NOT add ice or any other liquid to this solution. Shake             well before drinking.    Drink 1 bottle of contrast @ 9:00am (2 hours prior to your exam)  Drink 1 bottle of contrast @ 10:00am (1 hour prior to your exam)  You may take any medications as prescribed with a small amount of water except for the following: Metformin, Glucophage, Glucovance, Avandamet, Riomet, Fortamet, Actoplus Met, Janumet, Glumetza or Metaglip. The above medications must be held the day of the exam  AND 48 hours after the exam.  The purpose of you drinking the oral contrast is to aid in the visualization of your intestinal tract. The contrast solution may cause some diarrhea. Before your exam is started, you will be given a small amount of fluid to drink. Depending on your individual set of symptoms, you may also receive an intravenous injection of x-ray contrast/dye. Plan on being at [facility] for 30 minutes or long, depending on the type of exam you are having performed.  This test typically takes 30-45 minutes to complete.  If you have any questions regarding your exam or if you need to reschedule, you may call the CT department at [phone] between the hours of 8:00 am and 5:00 pm, Monday-Friday.

## 2014-12-21 ENCOUNTER — Ambulatory Visit (INDEPENDENT_AMBULATORY_CARE_PROVIDER_SITE_OTHER)
Admission: RE | Admit: 2014-12-21 | Discharge: 2014-12-21 | Disposition: A | Discharge status: Home / Self Care | Payer: Medicare Other | Source: Ambulatory Visit | Attending: Gastroenterology | Admitting: Gastroenterology

## 2014-12-21 DIAGNOSIS — R109 Unspecified abdominal pain: Secondary | ICD-10-CM | POA: Diagnosis not present

## 2014-12-21 DIAGNOSIS — G8929 Other chronic pain: Secondary | ICD-10-CM | POA: Diagnosis not present

## 2014-12-21 MED ORDER — IOHEXOL 300 MG/ML  SOLN
100.0000 mL | Freq: Once | INTRAMUSCULAR | Status: AC | PRN
  Administered 2014-12-21: 100 mL via INTRAVENOUS

## 2014-12-22 ENCOUNTER — Telehealth: Payer: Self-pay | Admitting: *Deleted

## 2014-12-22 NOTE — Telephone Encounter (Signed)
Faxed over referral form and notes to [facility] and [place], [place]. Phone number [phone] and Fax number [phone]

## 2014-12-28 ENCOUNTER — Other Ambulatory Visit: Payer: Self-pay

## 2014-12-28 DIAGNOSIS — R109 Unspecified abdominal pain: Secondary | ICD-10-CM

## 2014-12-30 ENCOUNTER — Encounter (HOSPITAL_COMMUNITY): Payer: Self-pay | Admitting: *Deleted

## 2015-01-13 ENCOUNTER — Ambulatory Visit (HOSPITAL_COMMUNITY)
Admission: RE | Admit: 2015-01-13 | Discharge: 2015-01-13 | Disposition: A | Discharge status: Home / Self Care | Payer: Medicare Other | Source: Ambulatory Visit | Attending: Gastroenterology | Admitting: Gastroenterology

## 2015-01-13 ENCOUNTER — Encounter (HOSPITAL_COMMUNITY)
Admission: RE | Disposition: A | Discharge status: Home / Self Care | Payer: Self-pay | Source: Ambulatory Visit | Attending: Gastroenterology

## 2015-01-13 ENCOUNTER — Ambulatory Visit (HOSPITAL_COMMUNITY): Payer: Medicare Other | Admitting: Anesthesiology

## 2015-01-13 ENCOUNTER — Encounter (HOSPITAL_COMMUNITY): Payer: Self-pay

## 2015-01-13 DIAGNOSIS — J45909 Unspecified asthma, uncomplicated: Secondary | ICD-10-CM | POA: Diagnosis not present

## 2015-01-13 DIAGNOSIS — F17210 Nicotine dependence, cigarettes, uncomplicated: Secondary | ICD-10-CM | POA: Diagnosis not present

## 2015-01-13 DIAGNOSIS — K5900 Constipation, unspecified: Secondary | ICD-10-CM | POA: Diagnosis not present

## 2015-01-13 DIAGNOSIS — Z79899 Other long term (current) drug therapy: Secondary | ICD-10-CM | POA: Diagnosis not present

## 2015-01-13 DIAGNOSIS — R109 Unspecified abdominal pain: Secondary | ICD-10-CM | POA: Diagnosis not present

## 2015-01-13 DIAGNOSIS — K259 Gastric ulcer, unspecified as acute or chronic, without hemorrhage or perforation: Secondary | ICD-10-CM

## 2015-01-13 DIAGNOSIS — K449 Diaphragmatic hernia without obstruction or gangrene: Secondary | ICD-10-CM | POA: Insufficient documentation

## 2015-01-13 DIAGNOSIS — K2970 Gastritis, unspecified, without bleeding: Secondary | ICD-10-CM | POA: Diagnosis not present

## 2015-01-13 DIAGNOSIS — B9681 Helicobacter pylori [H. pylori] as the cause of diseases classified elsewhere: Secondary | ICD-10-CM | POA: Diagnosis not present

## 2015-01-13 HISTORY — DX: Unspecified abdominal pain: R10.9

## 2015-01-13 HISTORY — DX: Post-traumatic stress disorder, unspecified: F43.10

## 2015-01-13 HISTORY — DX: Gastro-esophageal reflux disease without esophagitis: K21.9

## 2015-01-13 HISTORY — DX: Anemia, unspecified: D64.9

## 2015-01-13 HISTORY — PX: ESOPHAGOGASTRODUODENOSCOPY (EGD) WITH PROPOFOL: SHX5813

## 2015-01-13 SURGERY — ESOPHAGOGASTRODUODENOSCOPY (EGD) WITH PROPOFOL
Anesthesia: Monitor Anesthesia Care

## 2015-01-13 MED ORDER — PROPOFOL 10 MG/ML IV BOLUS
INTRAVENOUS | Status: DC | PRN
  Administered 2015-01-13: 30 mg via INTRAVENOUS
  Administered 2015-01-13: 40 mg via INTRAVENOUS

## 2015-01-13 MED ORDER — SODIUM CHLORIDE 0.9 % IV SOLN: INTRAVENOUS | Status: DC

## 2015-01-13 MED ORDER — LACTATED RINGERS IV SOLN
INTRAVENOUS | Status: DC
  Administered 2015-01-13: 10:00:00 via INTRAVENOUS

## 2015-01-13 MED ORDER — PROPOFOL 10 MG/ML IV BOLUS
INTRAVENOUS | Status: AC
  Filled 2015-01-13: qty 60

## 2015-01-13 MED ORDER — PROPOFOL 500 MG/50ML IV EMUL
INTRAVENOUS | Status: DC | PRN
  Administered 2015-01-13: 125 ug/kg/min via INTRAVENOUS

## 2015-01-13 MED ORDER — OXYCODONE HCL 5 MG/5ML PO SOLN: 5.0000 mg | Freq: Once | ORAL | Status: DC | PRN

## 2015-01-13 MED ORDER — OXYCODONE HCL 5 MG PO TABS: 5.0000 mg | ORAL_TABLET | Freq: Once | ORAL | Status: DC | PRN

## 2015-01-13 MED ORDER — FENTANYL CITRATE (PF) 100 MCG/2ML IJ SOLN: 25.0000 ug | INTRAMUSCULAR | Status: DC | PRN

## 2015-01-13 MED ORDER — OMEPRAZOLE 40 MG PO CPDR: 40.0000 mg | DELAYED_RELEASE_CAPSULE | Freq: Every day | ORAL | Status: DC

## 2015-01-13 MED ORDER — ONDANSETRON HCL 4 MG/2ML IJ SOLN: 4.0000 mg | Freq: Four times a day (QID) | INTRAMUSCULAR | Status: DC | PRN

## 2015-01-13 SURGICAL SUPPLY — 15 items

## 2015-01-13 NOTE — Anesthesia Preprocedure Evaluation (Signed)
Anesthesia Evaluation  Patient identified by MRN, date of birth, ID band Patient awake    Reviewed: Allergy & Precautions, NPO status , Patient's Chart, lab work & pertinent test results  Airway Mallampati: II   Neck ROM: full    Dental   Pulmonary asthma , Current Smoker,    breath sounds clear to auscultation       Cardiovascular negative cardio ROS   Rhythm:regular Rate:Normal     Neuro/Psych PSYCHIATRIC DISORDERS Depression    GI/Hepatic GERD  ,  Endo/Other    Renal/GU      Musculoskeletal   Abdominal   Peds  Hematology   Anesthesia Other Findings   Reproductive/Obstetrics                             Anesthesia Physical Anesthesia Plan  ASA: II  Anesthesia Plan: MAC   Post-op Pain Management:    Induction: Intravenous  Airway Management Planned: Nasal Cannula  Additional Equipment:   Intra-op Plan:   Post-operative Plan:   Informed Consent: I have reviewed the patients History and Physical, chart, labs and discussed the procedure including the risks, benefits and alternatives for the proposed anesthesia with the patient or authorized representative who has indicated his/her understanding and acceptance.     Plan Discussed with: CRNA, Anesthesiologist and Surgeon  Anesthesia Plan Comments:         Anesthesia Quick Evaluation

## 2015-01-13 NOTE — Discharge Instructions (Signed)

## 2015-01-13 NOTE — Op Note (Signed)
[facility] [place], [address]   ENDOSCOPY PROCEDURE REPORT  PATIENT: [name], [name]  MR#: [id number] BIRTHDATE: 10-10-1970 , 44  yrs. old GENDER: male ENDOSCOPIST: [name], MD PROCEDURE DATE:  01/13/2015 PROCEDURE:  EGD w/ biopsy ASA CLASS:     Class II INDICATIONS:  chronic abdominal pain, +/- weight loss; recent normal CT scan. MEDICATIONS: Monitored anesthesia care TOPICAL ANESTHETIC: none  DESCRIPTION OF PROCEDURE: After the risks benefits and alternatives of the procedure were thoroughly explained, informed consent was obtained.  The Pentax Gastroscope [id number] endoscope was introduced through the mouth and advanced to the second portion of the duodenum , Without limitations.  The instrument was slowly withdrawn as the mucosa was fully examined.    There was a 77mm clean based, shallow ulcer along the greater curvature of the stomach.  The surround mucosa was somewhat irregular appearing but not overtly neoplastic.  The ulcer and surround mucosa was extensively biopsied and sent to pathology. There was a small hiatal hernia.  The examination was otherwise normal.  Retroflexed views revealed no abnormalities.     The scope was then withdrawn from the patient and the procedure completed.  COMPLICATIONS: There were no immediate complications.  ENDOSCOPIC IMPRESSION: There was a 24mm clean based, shallow ulcer along the greater curvature of the stomach.  The surround mucosa was somewhat irregular appearing but not overtly neoplastic.  The ulcer and surround mucosa was extensively biopsied and sent to pathology. There was a small hiatal hernia.  The examination was otherwise normal  RECOMMENDATIONS: Await final pathology results. Will start stronger antiacid medicine for now; omeprazole 40mg  pill (take one pill 20-30 min prior to BF meal daily)   eSigned:  [name], MD 01/13/2015 10:38 AM

## 2015-01-13 NOTE — Interval H&P Note (Signed)
History and Physical Interval Note:  01/13/2015 10:11 AM  [name]  has presented today for surgery, with the diagnosis of abd pain  The various methods of treatment have been discussed with the patient and family. After consideration of risks, benefits and other options for treatment, the patient has consented to  Procedure(s): ESOPHAGOGASTRODUODENOSCOPY (EGD) WITH PROPOFOL (N/A) as a surgical intervention .  The patient's history has been reviewed, patient examined, no change in status, stable for surgery.  I have reviewed the patient's chart and labs.  Questions were answered to the patient's satisfaction.     [name]

## 2015-01-13 NOTE — Transfer of Care (Signed)
Immediate Anesthesia Transfer of Care Note  Patient: [name]  Procedure(s) Performed: Procedure(s): ESOPHAGOGASTRODUODENOSCOPY (EGD) WITH PROPOFOL (N/A)  Patient Location: PACU and Endoscopy Unit  Anesthesia Type:MAC  Level of Consciousness: sedated and patient cooperative  Airway & Oxygen Therapy: Patient Spontanous Breathing and Patient connected to nasal cannula oxygen  Post-op Assessment: Report given to RN and Post -op Vital signs reviewed and stable  Post vital signs: Reviewed and stable  Last Vitals:  Filed Vitals:   01/13/15 0954  BP: 138/74  Pulse: 70  Temp: 36.7 C  Resp: 13    Complications: No apparent anesthesia complications

## 2015-01-13 NOTE — H&P (View-Only) (Signed)
Review of pertinent gastrointestinal problems: 1. Chronic abdominal pains.  Former Dr. Patterson patient; CT scan 2008 with scattered RP LNs underwent EUS 2008 Dr. Sherlon Nied, node sampled and was bening.  REpeat EUS at request of Dr. Patterson for suspicion of chronic pancreatitis, findings on EUS showed mild changes that may be from CP, underwent CPN.  Was put 'empirically' on PERT 07/2014.  Repeat ED visits, 2016 for abd pain, never elevated LFTS, CBC or pancreatic enzymes. 2. Hiatal hernia noted EGD Dr. Patterson 12/2005, he questioned if severe GERD (NERD?) could be causing the symptoms. 3. Routine risk for colon cancer;  Dr. Patterson colonoscopy for weight loss and abdominal pain in November 2007. Multiple polyps were removed., Polyps were hyperplastic. Terminal ileal biopsies unremarkable as well  HPI: This is a   very pleasant 44-year-old man whom I last saw about 8 years ago time of an upper endoscopy with ultrasound. See that results summarized above  Chief complaint is continued severe abdominal pains, nausea, vomiting.  I note he has been in the emergency room several times the past year or 2 with complaints of the same  He has creon, needs to pick them up at the pharmacy.  Stays very contipated.  Never take nsaids.  He was asking about prescription for narcotic pain medicines  He tells me he has been losing a lot of weight however when I compare his weight today versus 6 months ago on the same scale here in GI he is actually up 1 pound.  He smokes cigarettes but does not drink alcohol.  Past Medical History  Diagnosis Date  . Asthma   . Pancreatitis   . Rectal polyp 01/09/2006    Hyperplastic    Past Surgical History  Procedure Laterality Date  . Hernia repair    . Arm surgery      Current Outpatient Prescriptions  Medication Sig Dispense Refill  . [facility] OMEPRAZOLE 20 MG TBEC Take 1 tablet by mouth daily.  2  . Cyanocobalamin (VITAMIN B-12 PO) Take 1 tablet by mouth  daily.    . dicyclomine (BENTYL) 20 MG tablet Take 1 tablet (20 mg total) by mouth 2 (two) times daily. 20 tablet 0  . lipase/protease/amylase (CREON) 36000 UNITS CPEP capsule Take 2 tab with snacks and 3 tab with (Patient taking differently: Take 36,000 Units by mouth 3 (three) times daily before meals. Take 2 capsule with lunch and dinner  and 1 capsule with a snack.) 390 capsule 1  . Multiple Vitamin (MULTIVITAMIN WITH MINERALS) TABS tablet Take 1 tablet by mouth daily. One-A-Day.    . ondansetron (ZOFRAN) 4 MG tablet Take 1 tablet (4 mg total) by mouth every 6 (six) hours. 12 tablet 0  . oxyCODONE-acetaminophen (PERCOCET/ROXICET) 5-325 MG per tablet Take 1 tablet by mouth every 6 (six) hours as needed for severe pain. 15 tablet 0  . promethazine (PHENERGAN) 25 MG tablet Take 1 tablet (25 mg total) by mouth every 6 (six) hours as needed for nausea or vomiting. 10 tablet 0  . ranitidine (ZANTAC) 150 MG tablet Take 150 mg by mouth daily as needed for heartburn.    . traZODone (DESYREL) 50 MG tablet Take 50 mg by mouth at bedtime.     No current facility-administered medications for this visit.    Allergies as of 12/15/2014 - Review Complete 12/15/2014  Allergen Reaction Noted  . Aspirin Shortness Of Breath and Swelling 01/12/2009  . Ibuprofen Shortness Of Breath and Swelling 01/12/2009  . Vicodin [hydrocodone-acetaminophen] Hives   and Itching 05/26/2014    Family History  Problem Relation Age of Onset  . Stroke Mother   . Hypertension Mother   . Cancer Other   . Diabetes Other     Social History   Social History  . Marital Status: Single    Spouse Name: N/A  . Number of Children: N/A  . Years of Education: N/A   Occupational History  . Not on file.   Social History Main Topics  . Smoking status: Current Some Day Smoker    Types: Cigarettes  . Smokeless tobacco: Never Used  . Alcohol Use: No  . Drug Use: No  . Sexual Activity: Not on file   Other Topics Concern  . Not  on file   Social History Narrative     Physical Exam: BP 98/60 mmHg  Pulse 62  Ht 6' 2" (1.88 m)  Wt 179 lb (81.194 kg)  BMI 22.97 kg/m2 Constitutional: generally well-appearing Psychiatric: alert and oriented x3 Abdomen: soft, nontender, nondistended, no obvious ascites, no peritoneal signs, normal bowel sounds   Assessment and plan: 44 y.o. male with chronic abdominal pains  He has been told that he may have chronic pancreatitis however it is not clear to me that that is truly the case. No imaging study has ever shown clear evidence of calcific pancreatitis. Endoscopic ultrasound 2008 suggested some mild changes however the sensitivity and specificity of endoscopic ultrasound diagnosing chronic pancreatitis is low especially when the findings are mild. He does not have any sign of pancreatic insufficiency. I cannot tell that he is actually taking his pancreas enzyme supplement. He says he is going to pick up at the pharmacy tomorrow. He was asking for narcotic pain medicines. I'm suspicious of drug-seeking behavior. It is been over a year since he's had good imaging of his abdomen and going to repeat CT scan abdomen and pelvis. Perhaps there will be some more definitive explanation for his abdominal pains on this. I recommended he go ahead and start those pancreas enzyme supplements that were called in for him 6 months ago. We will refer for a pain clinic visit.  Lastly I'm going to have him get fecal elastase level checked, this is a fairly good test to diagnose pancreatic insufficiency thickened associated with pancreatic calcifications, chronic pancreatitis.   Robert Cathey, MD  Gastroenterology 12/15/2014, 9:03 AM    

## 2015-01-13 NOTE — Anesthesia Postprocedure Evaluation (Signed)
Anesthesia Post Note  Patient: [name]  Procedure(s) Performed: Procedure(s) (LRB): ESOPHAGOGASTRODUODENOSCOPY (EGD) WITH PROPOFOL (N/A)  Patient location during evaluation: PACU Anesthesia Type: MAC Level of consciousness: awake and alert Pain management: pain level controlled Vital Signs Assessment: post-procedure vital signs reviewed and stable Respiratory status: spontaneous breathing, nonlabored ventilation, respiratory function stable and patient connected to nasal cannula oxygen Cardiovascular status: stable and blood pressure returned to baseline Anesthetic complications: no    Last Vitals:  Filed Vitals:   01/13/15 1050 01/13/15 1100  BP: 127/80 148/88  Pulse: 58 58  Temp:    Resp: 9 11    Last Pain:  Filed Vitals:   01/13/15 1104  PainSc: [place]

## 2015-01-14 ENCOUNTER — Encounter (HOSPITAL_COMMUNITY): Payer: Self-pay | Admitting: Gastroenterology

## 2015-01-18 ENCOUNTER — Other Ambulatory Visit: Payer: Self-pay

## 2015-01-18 MED ORDER — BIS SUBCIT-METRONID-TETRACYC 140-125-125 MG PO CAPS: 3.0000 | ORAL_CAPSULE | Freq: Three times a day (TID) | ORAL | Status: DC

## 2015-02-03 ENCOUNTER — Telehealth: Payer: Self-pay | Admitting: Gastroenterology

## 2015-02-03 ENCOUNTER — Telehealth: Payer: Self-pay

## 2015-02-03 ENCOUNTER — Other Ambulatory Visit: Payer: Self-pay

## 2015-02-03 MED ORDER — BIS SUBCIT-METRONID-TETRACYC 140-125-125 MG PO CAPS: 3.0000 | ORAL_CAPSULE | Freq: Three times a day (TID) | ORAL | Status: DC

## 2015-02-03 NOTE — Telephone Encounter (Signed)
Left message on machine to call back  

## 2015-02-03 NOTE — Telephone Encounter (Signed)
Ok, forget the pylera and instead please call in amoxicillin 1gram po bid, clarithromycin 500mg  po bid.  Both for 10 days.  He needs to continue omeprazole twice daily during this course of Abx.  He can take tylenol for HAs (1gm once or twice daily).  After the abx are gone, he can back down to once daily omeprazole  He needs rov with me in 6-7 weeks   taghnks

## 2015-02-03 NOTE — Telephone Encounter (Signed)
Pt has been experiencing headaches since starting omeprazole Pt also states he did not pick up the abx for H pylori, I advised him to go to the pharmacy and pick up the abx and start that today and I will forward to Dr [name] for recommendations on the omeprazole causing headaches.

## 2015-02-03 NOTE — Telephone Encounter (Signed)
Dr [name] the pts pharmacy called because the Pylera was to expensive, I advised the pharmacy to give him the bismuth, flagyl and tetracycline, however, his insurance will not cover tetracycline it will cover Doxycycline is it ok to switch?

## 2015-02-03 NOTE — Telephone Encounter (Signed)
See second phone note from today.

## 2015-02-03 NOTE — Telephone Encounter (Signed)
Pharmacy has been notified and the pt will be contacted to pick up the prescription.  He will keep follow up appt as scheduled.

## 2015-02-27 ENCOUNTER — Emergency Department (HOSPITAL_COMMUNITY): Payer: Medicare Other

## 2015-02-27 ENCOUNTER — Encounter (HOSPITAL_COMMUNITY): Payer: Self-pay

## 2015-02-27 ENCOUNTER — Inpatient Hospital Stay (HOSPITAL_COMMUNITY)
Admission: EM | Admit: 2015-02-27 | Discharge: 2015-03-02 | DRG: 026 | Disposition: A | Discharge status: Home / Self Care | Payer: Medicare Other | Attending: Neurosurgery | Admitting: Neurosurgery

## 2015-02-27 DIAGNOSIS — R4182 Altered mental status, unspecified: Secondary | ICD-10-CM

## 2015-02-27 DIAGNOSIS — J45909 Unspecified asthma, uncomplicated: Secondary | ICD-10-CM | POA: Diagnosis present

## 2015-02-27 DIAGNOSIS — R32 Unspecified urinary incontinence: Secondary | ICD-10-CM | POA: Diagnosis present

## 2015-02-27 DIAGNOSIS — K219 Gastro-esophageal reflux disease without esophagitis: Secondary | ICD-10-CM | POA: Diagnosis present

## 2015-02-27 DIAGNOSIS — F1010 Alcohol abuse, uncomplicated: Secondary | ICD-10-CM | POA: Diagnosis present

## 2015-02-27 DIAGNOSIS — R4701 Aphasia: Secondary | ICD-10-CM | POA: Diagnosis present

## 2015-02-27 DIAGNOSIS — G9389 Other specified disorders of brain: Secondary | ICD-10-CM

## 2015-02-27 DIAGNOSIS — S065X9A Traumatic subdural hemorrhage with loss of consciousness of unspecified duration, initial encounter: Secondary | ICD-10-CM | POA: Diagnosis present

## 2015-02-27 DIAGNOSIS — F4310 Post-traumatic stress disorder, unspecified: Secondary | ICD-10-CM | POA: Diagnosis present

## 2015-02-27 DIAGNOSIS — Z23 Encounter for immunization: Secondary | ICD-10-CM

## 2015-02-27 DIAGNOSIS — Z885 Allergy status to narcotic agent status: Secondary | ICD-10-CM

## 2015-02-27 DIAGNOSIS — Z823 Family history of stroke: Secondary | ICD-10-CM

## 2015-02-27 DIAGNOSIS — S065XAA Traumatic subdural hemorrhage with loss of consciousness status unknown, initial encounter: Secondary | ICD-10-CM | POA: Diagnosis present

## 2015-02-27 DIAGNOSIS — E871 Hypo-osmolality and hyponatremia: Secondary | ICD-10-CM | POA: Diagnosis present

## 2015-02-27 DIAGNOSIS — I6203 Nontraumatic chronic subdural hemorrhage: Secondary | ICD-10-CM | POA: Diagnosis not present

## 2015-02-27 DIAGNOSIS — K861 Other chronic pancreatitis: Secondary | ICD-10-CM | POA: Diagnosis present

## 2015-02-27 DIAGNOSIS — F17210 Nicotine dependence, cigarettes, uncomplicated: Secondary | ICD-10-CM | POA: Diagnosis present

## 2015-02-27 DIAGNOSIS — R9089 Other abnormal findings on diagnostic imaging of central nervous system: Secondary | ICD-10-CM

## 2015-02-27 DIAGNOSIS — G939 Disorder of brain, unspecified: Secondary | ICD-10-CM

## 2015-02-27 DIAGNOSIS — Z884 Allergy status to anesthetic agent status: Secondary | ICD-10-CM

## 2015-02-27 LAB — COMPREHENSIVE METABOLIC PANEL
ALT: 12 U/L — ABNORMAL LOW (ref 17–63)
AST: 24 U/L (ref 15–41)
Albumin: 3.9 g/dL (ref 3.5–5.0)
Alkaline Phosphatase: 50 U/L (ref 38–126)
Anion gap: 12 (ref 5–15)
BUN: 11 mg/dL (ref 6–20)
CO2: 22 mmol/L (ref 22–32)
Calcium: 9.2 mg/dL (ref 8.9–10.3)
Chloride: 90 mmol/L — ABNORMAL LOW (ref 101–111)
Creatinine, Ser: 0.96 mg/dL (ref 0.61–1.24)
GFR calc Af Amer: >60 mL/min (ref >60)
GFR calc non Af Amer: >60 mL/min (ref >60)
Glucose, Bld: 91 mg/dL (ref 65–99)
Potassium: 3.8 mmol/L (ref 3.5–5.1)
Sodium: 124 mmol/L — ABNORMAL LOW (ref 135–145)
Total Bilirubin: 0.7 mg/dL (ref 0.3–1.2)
Total Protein: 7.1 g/dL (ref 6.5–8.1)

## 2015-02-27 LAB — CBC
HCT: 39 % (ref 39.0–52.0)
Hemoglobin: 13.8 g/dL (ref 13.0–17.0)
MCH: 30.5 pg (ref 26.0–34.0)
MCHC: 35.4 g/dL (ref 30.0–36.0)
MCV: 86.1 fL (ref 78.0–100.0)
Platelets: 173 10*3/uL (ref 150–400)
RBC: 4.53 MIL/uL (ref 4.22–5.81)
RDW: 12.8 % (ref 11.5–15.5)
WBC: 3.9 10*3/uL — ABNORMAL LOW (ref 4.0–10.5)

## 2015-02-27 LAB — ETHANOL: Alcohol, Ethyl (B): <5 mg/dL (ref <5)

## 2015-02-27 LAB — I-STAT CG4 LACTIC ACID, ED: Lactic Acid, Venous: 1.27 mmol/L (ref 0.5–2.0)

## 2015-02-27 LAB — I-STAT VENOUS BLOOD GAS, ED
Acid-base deficit: 1 mmol/L (ref 0.0–2.0)
Bicarbonate: 21.1 mEq/L (ref 20.0–24.0)
O2 Saturation: 40 %
TCO2: 22 mmol/L (ref 0–100)
pCO2, Ven: 28.7 mmHg — ABNORMAL LOW (ref 45.0–50.0)
pH, Ven: 7.473 — ABNORMAL HIGH (ref 7.250–7.300)
pO2, Ven: 21 mmHg — CL (ref 30.0–45.0)

## 2015-02-27 LAB — CK: Total CK: 199 U/L (ref 49–397)

## 2015-02-27 LAB — SALICYLATE LEVEL: Salicylate Lvl: <4 mg/dL (ref 2.8–30.0)

## 2015-02-27 LAB — ACETAMINOPHEN LEVEL: Acetaminophen (Tylenol), Serum: <10 ug/mL — ABNORMAL LOW (ref 10–30)

## 2015-02-27 LAB — CBG MONITORING, ED: Glucose-Capillary: 98 mg/dL (ref 65–99)

## 2015-02-27 LAB — AMMONIA: Ammonia: 22 umol/L (ref 9–35)

## 2015-02-27 MED ORDER — SODIUM CHLORIDE 0.9 % IV BOLUS (SEPSIS)
1000.0000 mL | Freq: Once | INTRAVENOUS | Status: AC
  Administered 2015-02-27: 1000 mL via INTRAVENOUS

## 2015-02-27 NOTE — ED Provider Notes (Signed)
CSN: [id number]     Arrival date & time 02/27/15  2221 History   First MD Initiated Contact with Patient 02/27/15 2307     Chief Complaint  Patient presents with  . Altered Mental Status  . Cold Exposure  . Aphasia     (Consider location/radiation/quality/duration/timing/severity/associated sxs/prior Treatment) HPI Comments: 45 year old male who presents with altered mental status. History limited because of the patient's altered mentation and obtained primarily from EMS. EMS was called to a gas station where the patient had been sitting in his car for the past 24 hours according to bystanders. The patient has been nonverbal during transport. He was noted to be incontinent of urine. He has been moving all 4 extremities. Father has not seen him in almost a year and states that he is not aware of any alcohol or drug use.  LEVEL 5 CAVEAT APPLIES 2/2 AMS  Patient is a 45 y.o. male presenting with altered mental status. The history is provided by the EMS personnel.  Altered Mental Status   Past Medical History  Diagnosis Date  . Asthma   . Pancreatitis   . Rectal polyp 01/09/2006    Hyperplastic  . Anemia   . PTSD (post-traumatic stress disorder)     controlled  . GERD (gastroesophageal reflux disease)   . Abdominal pain     intermittent   Past Surgical History  Procedure Laterality Date  . Hernia repair    . Arm surgery Left     repair of tendons "work injury cut"-"pinky finger numb"  . Esophagogastroduodenoscopy endoscopy    . Esophagogastroduodenoscopy (egd) with propofol N/A 01/13/2015    Procedure: ESOPHAGOGASTRODUODENOSCOPY (EGD) WITH PROPOFOL;  Surgeon: [name], MD;  Location: [facility];  Service: Endoscopy;  Laterality: N/A;   Family History  Problem Relation Age of Onset  . Stroke Mother   . Hypertension Mother   . Cancer Other   . Diabetes Other    Social History  Substance Use Topics  . Smoking status: Current Some Day Smoker -- 0.25 packs/day     Types: Cigarettes  . Smokeless tobacco: Never Used     Comment: 6 cigs per week  . Alcohol Use: No    Review of Systems  Unable to perform ROS: Mental status change      Allergies  Aspirin; Ibuprofen; and Hydrocodone  Home Medications   Prior to Admission medications   Medication Sig Start Date End Date Taking? Authorizing Provider  bismuth-metronidazole-tetracycline [facility]) [phone] MG capsule Take 3 capsules by mouth 4 (four) times daily -  before meals and at bedtime. 02/03/15   [name], MD  Cyanocobalamin (VITAMIN B-12 PO) Take 1 tablet by mouth daily.    Historical Provider, MD  dicyclomine (BENTYL) 20 MG tablet Take 1 tablet (20 mg total) by mouth 2 (two) times daily. 09/17/14   Domenic Moras, PA-C  lipase/protease/amylase (CREON) 36000 UNITS CPEP capsule Take 2 tab with snacks and 3 tab with Patient taking differently: Take 36,000-108,000 Units by mouth See admin instructions. Take 3 capsule three times a day with meals  and 1 capsule with a snack. 07/02/14   Willia Craze, NP  Multiple Vitamin (MULTIVITAMIN WITH MINERALS) TABS tablet Take 1 tablet by mouth daily. One-A-Day.    Historical Provider, MD  naphazoline-pheniramine (NAPHCON-A) 0.025-0.3 % ophthalmic solution Place 1 drop into both eyes daily as needed for irritation or allergies.    Historical Provider, MD  omeprazole (PRILOSEC) 40 MG capsule Take 1 capsule (40 mg  total) by mouth daily. 01/13/15   [name], MD  ondansetron (ZOFRAN) 4 MG tablet Take 1 tablet (4 mg total) by mouth every 6 (six) hours. 05/27/14   Dahlia Bailiff, PA-C  oxyCODONE-acetaminophen (PERCOCET/ROXICET) 5-325 MG per tablet Take 1 tablet by mouth every 6 (six) hours as needed for severe pain. 07/23/14   Dalia Heading, PA-C  promethazine (PHENERGAN) 25 MG tablet Take 1 tablet (25 mg total) by mouth every 6 (six) hours as needed for nausea or vomiting. 09/17/14   Domenic Moras, PA-C  traZODone (DESYREL) 100 MG tablet Take 100 mg by mouth at  bedtime.    Historical Provider, MD   BP 109/71 mmHg  Pulse 55  Resp 14  Ht 6\' 2"  (1.88 m)  Wt 175 lb (79.379 kg)  BMI 22.46 kg/m2  SpO2 100% Physical Exam  Constitutional: He appears well-developed and well-nourished.  Awake, moving all 4 ext  HENT:  Head: Normocephalic and atraumatic.  Mildly dry mucous membranes  Eyes: Conjunctivae are normal. Pupils are equal, round, and reactive to light.  Neck: Neck supple.  Cardiovascular: Normal rate, regular rhythm and normal heart sounds.   No murmur heard. Pulmonary/Chest: Effort normal and breath sounds normal.  Abdominal: Soft. Bowel sounds are normal. He exhibits no distension. There is no tenderness.  Genitourinary: Penis normal.  Musculoskeletal: He exhibits no edema or tenderness.  Neurological: He is alert. He exhibits normal muscle tone.  Awake, moving all 4 ext, will make eye contact and open mouth to command but unable to answer questions  Skin: Skin is warm and dry. No rash noted.  Nursing note and vitals reviewed.   ED Course  .Critical Care Performed by: [name] Authorized by: [name] Total critical care time: 45 minutes Critical care time was exclusive of separately billable procedures and treating other patients. Critical care was necessary to treat or prevent imminent or life-threatening deterioration of the following conditions: CNS failure or compromise. Critical care was time spent personally by me on the following activities: development of treatment plan with patient or surrogate, discussions with consultants, evaluation of patient's response to treatment, examination of patient, obtaining history from patient or surrogate, ordering and performing treatments and interventions, ordering and review of laboratory studies, ordering and review of radiographic studies, re-evaluation of patient's condition and review of old charts.   (including critical care time) Labs Review Labs Reviewed   COMPREHENSIVE METABOLIC PANEL - Abnormal; Notable for the following:    Sodium 124 (*)    Chloride 90 (*)    ALT 12 (*)    All other components within normal limits  CBC - Abnormal; Notable for the following:    WBC 3.9 (*)    All other components within normal limits  ACETAMINOPHEN LEVEL - Abnormal; Notable for the following:    Acetaminophen (Tylenol), Serum <10 (*)    All other components within normal limits  PROTIME-INR - Abnormal; Notable for the following:    Prothrombin Time 15.7 (*)    All other components within normal limits  I-STAT VENOUS BLOOD GAS, ED - Abnormal; Notable for the following:    pH, Ven 7.473 (*)    pCO2, Ven 28.7 (*)    pO2, Ven 21.0 (*)    All other components within normal limits  ETHANOL  SALICYLATE LEVEL  AMMONIA  CK  URINALYSIS, ROUTINE W REFLEX MICROSCOPIC (NOT AT [facility])  URINE RAPID DRUG SCREEN, HOSP PERFORMED  APTT  CBG MONITORING, ED  I-STAT CG4 LACTIC  ACID, ED  I-STAT CG4 LACTIC ACID, ED    Imaging Review Ct Head Wo Contrast  02/28/2015  CLINICAL DATA:  Found in car after 24 hours. Altered mental status. Incontinence. History of alcoholism and narcotic abuse. EXAM: CT HEAD WITHOUT CONTRAST TECHNIQUE: Contiguous axial images were obtained from the base of the skull through the vertex without intravenous contrast. COMPARISON:  None. FINDINGS: Intermediate density 2.4 cm RIGHT frontal extra-axial fluid collection resultant 14 mm RIGHT to LEFT midline shift. LEFT ventricular entrapment with transependymal flow cerebral spinal fluid. No intraparenchymal hemorrhage. No acute large vascular territory infarcts. Basal cisterns are partially effaced. Ocular globes and orbital contents are unremarkable. Paranasal sinuses and mastoid air cells are well aerated. No skull fracture. IMPRESSION: 2.4 cm RIGHT frontal extra-axial fluid collection could represent subdural hematoma or possible empyema. 14 mm RIGHT to LEFT subfalcine herniation with LEFT ventricular  entrapment. Acute findings discussed with and reconfirmed by Dr.Haylie Mccutcheon on 02/28/2015 at 1:08 am. Electronically Signed   By: Elon Alas M.D.   On: 02/28/2015 01:09   I have personally reviewed and evaluated these images and lab results as part of my medical decision-making.   EKG Interpretation   Date/Time:  Sunday February 27 2015 22:30:44 EST Ventricular Rate:  60 PR Interval:  159 QRS Duration: 82 QT Interval:  413 QTC Calculation: 413 R Axis:   97 Text Interpretation:  Sinus rhythm Borderline right axis deviation RSR' in  V1 or V2, probably normal variant Abnrm T, consider ischemia,  anterolateral lds No significant change since last tracing T wave in V2  similar to EKG in 2000 Confirmed by Arine Foley MD, Damonica Chopra [id number]) on 02/27/2015  11:20:09 PM     Medications  sodium chloride 0.9 % bolus 1,000 mL (1,000 mLs Intravenous New Bag/Given 02/27/15 2329)  LORazepam (ATIVAN) 2 MG/ML injection (1 mg  Given 02/28/15 0045)    MDM   Final diagnoses:  Subdural fluid collection [facility])  Midline shift of brain  Altered mental status, unspecified altered mental status type  Hyponatremia   Pt brought in by EMS for altered mental status after being seen in his car sitting in a gas station for 24 hours. Initial vital signs stable. Patient nonverbal but moving all 4 extremities and able to make eye contact and follow basic commands however appears confused. Obtained above lab work including toxicology labs, VBG, lactate, CK. Gave pt IVF bolus. Also obtained head CT; gave pt ativan during scan for agitation.  Labs showed normal lactate and ammonia, reassuring VBG, sodium 124, chloride 90. Head CT shows large right-sided frontal fluid collection, subdural hematoma versus empyema, causing 14 mm of midline shift. Immediately contacted neurosurgery and discussed with Dr. Arnoldo Morale, appreciate his assistance. I have sent coags and discussed imaging w/ radiology. Dr. Arnoldo Morale will contact OR to prepare for  operative intervention. On reexamination, the patient is resting comfortably and continues to protect his airway. Patient admitted for further care.   [name], MD 02/28/15 [id number]

## 2015-02-27 NOTE — ED Notes (Signed)
Pt here after being found in car for last 24 hours, pt is not speaking, slow to respond and appears defensive, pt nods to questions but will not give information related to current condition. People at gas station stated they have seen him in car for last 24 hours. Pt denies pain only shakes head that he is cold. Pt incontinent of urine and staring at staff.

## 2015-02-28 ENCOUNTER — Encounter (HOSPITAL_COMMUNITY): Payer: Self-pay | Admitting: Anesthesiology

## 2015-02-28 ENCOUNTER — Emergency Department (HOSPITAL_COMMUNITY): Payer: Medicare Other

## 2015-02-28 ENCOUNTER — Encounter (HOSPITAL_COMMUNITY)
Admission: EM | Disposition: A | Discharge status: Home / Self Care | Payer: Self-pay | Source: Home / Self Care | Attending: Neurosurgery

## 2015-02-28 ENCOUNTER — Emergency Department (HOSPITAL_COMMUNITY): Payer: Medicare Other | Admitting: Anesthesiology

## 2015-02-28 DIAGNOSIS — Z23 Encounter for immunization: Secondary | ICD-10-CM | POA: Diagnosis not present

## 2015-02-28 DIAGNOSIS — R4182 Altered mental status, unspecified: Secondary | ICD-10-CM | POA: Diagnosis present

## 2015-02-28 DIAGNOSIS — Z885 Allergy status to narcotic agent status: Secondary | ICD-10-CM | POA: Diagnosis not present

## 2015-02-28 DIAGNOSIS — Z884 Allergy status to anesthetic agent status: Secondary | ICD-10-CM | POA: Diagnosis not present

## 2015-02-28 DIAGNOSIS — J45909 Unspecified asthma, uncomplicated: Secondary | ICD-10-CM | POA: Diagnosis present

## 2015-02-28 DIAGNOSIS — K861 Other chronic pancreatitis: Secondary | ICD-10-CM | POA: Diagnosis present

## 2015-02-28 DIAGNOSIS — F4310 Post-traumatic stress disorder, unspecified: Secondary | ICD-10-CM | POA: Diagnosis present

## 2015-02-28 DIAGNOSIS — K219 Gastro-esophageal reflux disease without esophagitis: Secondary | ICD-10-CM | POA: Diagnosis present

## 2015-02-28 DIAGNOSIS — E871 Hypo-osmolality and hyponatremia: Secondary | ICD-10-CM | POA: Diagnosis present

## 2015-02-28 DIAGNOSIS — R4701 Aphasia: Secondary | ICD-10-CM | POA: Diagnosis present

## 2015-02-28 DIAGNOSIS — I6203 Nontraumatic chronic subdural hemorrhage: Secondary | ICD-10-CM | POA: Diagnosis present

## 2015-02-28 DIAGNOSIS — S065XAA Traumatic subdural hemorrhage with loss of consciousness status unknown, initial encounter: Secondary | ICD-10-CM | POA: Diagnosis present

## 2015-02-28 DIAGNOSIS — F1010 Alcohol abuse, uncomplicated: Secondary | ICD-10-CM | POA: Diagnosis present

## 2015-02-28 DIAGNOSIS — F17210 Nicotine dependence, cigarettes, uncomplicated: Secondary | ICD-10-CM | POA: Diagnosis present

## 2015-02-28 DIAGNOSIS — Z823 Family history of stroke: Secondary | ICD-10-CM | POA: Diagnosis not present

## 2015-02-28 DIAGNOSIS — S065X9A Traumatic subdural hemorrhage with loss of consciousness of unspecified duration, initial encounter: Secondary | ICD-10-CM | POA: Diagnosis present

## 2015-02-28 DIAGNOSIS — R32 Unspecified urinary incontinence: Secondary | ICD-10-CM | POA: Diagnosis present

## 2015-02-28 HISTORY — PX: BURR HOLE: SHX908

## 2015-02-28 LAB — URINE MICROSCOPIC-ADD ON

## 2015-02-28 LAB — URINALYSIS, ROUTINE W REFLEX MICROSCOPIC
Bilirubin Urine: NEGATIVE
Glucose, UA: NEGATIVE mg/dL
Ketones, ur: 40 mg/dL — AB
Leukocytes, UA: NEGATIVE
Nitrite: NEGATIVE
Protein, ur: NEGATIVE mg/dL
Specific Gravity, Urine: 1.02 (ref 1.005–1.030)
pH: 5 (ref 5.0–8.0)

## 2015-02-28 LAB — RAPID URINE DRUG SCREEN, HOSP PERFORMED
Amphetamines: NOT DETECTED
Barbiturates: NOT DETECTED
Benzodiazepines: NOT DETECTED
Cocaine: NOT DETECTED
Opiates: NOT DETECTED
Tetrahydrocannabinol: POSITIVE — AB

## 2015-02-28 LAB — PROTIME-INR
INR: 1.23 (ref 0.00–1.49)
Prothrombin Time: 15.7 seconds — ABNORMAL HIGH (ref 11.6–15.2)

## 2015-02-28 LAB — BASIC METABOLIC PANEL
Anion gap: 7 (ref 5–15)
BUN: 9 mg/dL (ref 6–20)
CO2: 23 mmol/L (ref 22–32)
Calcium: 8.7 mg/dL — ABNORMAL LOW (ref 8.9–10.3)
Chloride: 96 mmol/L — ABNORMAL LOW (ref 101–111)
Creatinine, Ser: 1.03 mg/dL (ref 0.61–1.24)
GFR calc Af Amer: >60 mL/min (ref >60)
GFR calc non Af Amer: >60 mL/min (ref >60)
Glucose, Bld: 73 mg/dL (ref 65–99)
Potassium: 4 mmol/L (ref 3.5–5.1)
Sodium: 126 mmol/L — ABNORMAL LOW (ref 135–145)

## 2015-02-28 LAB — I-STAT CG4 LACTIC ACID, ED: Lactic Acid, Venous: 1.32 mmol/L (ref 0.5–2.0)

## 2015-02-28 LAB — APTT: aPTT: 48 seconds — ABNORMAL HIGH (ref 24–37)

## 2015-02-28 LAB — MRSA PCR SCREENING: MRSA by PCR: NEGATIVE

## 2015-02-28 SURGERY — CREATION, CRANIAL BURR HOLE
Anesthesia: General | Site: Head | Laterality: Right

## 2015-02-28 MED ORDER — ONDANSETRON HCL 4 MG/2ML IJ SOLN: 4.0000 mg | Freq: Once | INTRAMUSCULAR | Status: DC | PRN

## 2015-02-28 MED ORDER — ROCURONIUM BROMIDE 100 MG/10ML IV SOLN
INTRAVENOUS | Status: DC | PRN
  Administered 2015-02-28: 30 mg via INTRAVENOUS
  Administered 2015-02-28: 10 mg via INTRAVENOUS

## 2015-02-28 MED ORDER — SODIUM CHLORIDE 0.9 % IV SOLN
INTRAVENOUS | Status: DC | PRN
  Administered 2015-02-28: 02:00:00 via INTRAVENOUS

## 2015-02-28 MED ORDER — SUGAMMADEX SODIUM 200 MG/2ML IV SOLN
INTRAVENOUS | Status: DC | PRN
  Administered 2015-02-28: 200 mg via INTRAVENOUS

## 2015-02-28 MED ORDER — 0.9 % SODIUM CHLORIDE (POUR BTL) OPTIME
TOPICAL | Status: DC | PRN
  Administered 2015-02-28 (×3): 1000 mL

## 2015-02-28 MED ORDER — BIS SUBCIT-METRONID-TETRACYC 140-125-125 MG PO CAPS: 3.0000 | ORAL_CAPSULE | Freq: Three times a day (TID) | ORAL | Status: DC

## 2015-02-28 MED ORDER — MORPHINE SULFATE (PF) 2 MG/ML IV SOLN
1.0000 mg | INTRAVENOUS | Status: DC | PRN
  Administered 2015-02-28 – 2015-03-02 (×6): 2 mg via INTRAVENOUS
  Filled 2015-02-28 (×6): qty 1

## 2015-02-28 MED ORDER — LORAZEPAM 2 MG/ML IJ SOLN
INTRAMUSCULAR | Status: AC
  Administered 2015-02-28: 1 mg
  Filled 2015-02-28: qty 1

## 2015-02-28 MED ORDER — BACITRACIN ZINC 500 UNIT/GM EX OINT
TOPICAL_OINTMENT | CUTANEOUS | Status: DC | PRN
  Administered 2015-02-28 (×2): 1 via TOPICAL

## 2015-02-28 MED ORDER — INFLUENZA VAC SPLIT QUAD 0.5 ML IM SUSY
0.5000 mL | PREFILLED_SYRINGE | INTRAMUSCULAR | Status: AC
  Administered 2015-03-01: 0.5 mL via INTRAMUSCULAR
  Filled 2015-02-28: qty 0.5

## 2015-02-28 MED ORDER — PANTOPRAZOLE SODIUM 40 MG PO TBEC: 80.0000 mg | DELAYED_RELEASE_TABLET | Freq: Every day | ORAL | Status: DC

## 2015-02-28 MED ORDER — FENTANYL CITRATE (PF) 250 MCG/5ML IJ SOLN
INTRAMUSCULAR | Status: DC | PRN
  Administered 2015-02-28: 50 ug via INTRAVENOUS
  Administered 2015-02-28: 100 ug via INTRAVENOUS

## 2015-02-28 MED ORDER — FENTANYL CITRATE (PF) 250 MCG/5ML IJ SOLN
INTRAMUSCULAR | Status: AC
Start: 2015-02-28 — End: 2015-02-28
  Filled 2015-02-28: qty 5

## 2015-02-28 MED ORDER — SUCCINYLCHOLINE CHLORIDE 20 MG/ML IJ SOLN
INTRAMUSCULAR | Status: DC | PRN
  Administered 2015-02-28: 100 mg via INTRAVENOUS

## 2015-02-28 MED ORDER — BUPIVACAINE-EPINEPHRINE 0.5% -1:200000 IJ SOLN
INTRAMUSCULAR | Status: DC | PRN
  Administered 2015-02-28: 10 mL

## 2015-02-28 MED ORDER — CEFAZOLIN SODIUM-DEXTROSE 2-3 GM-% IV SOLR
INTRAVENOUS | Status: DC | PRN
  Administered 2015-02-28: 2 g via INTRAVENOUS

## 2015-02-28 MED ORDER — ACETAMINOPHEN 325 MG PO TABS: 650.0000 mg | ORAL_TABLET | ORAL | Status: DC | PRN

## 2015-02-28 MED ORDER — SUGAMMADEX SODIUM 200 MG/2ML IV SOLN
INTRAVENOUS | Status: AC
  Filled 2015-02-28: qty 2

## 2015-02-28 MED ORDER — PANCRELIPASE (LIP-PROT-AMYL) 36000-114000 UNITS PO CPEP
108000.0000 [IU] | ORAL_CAPSULE | Freq: Three times a day (TID) | ORAL | Status: DC
  Administered 2015-02-28 – 2015-03-02 (×7): 108000 [IU] via ORAL
  Filled 2015-02-28 (×10): qty 3

## 2015-02-28 MED ORDER — DICYCLOMINE HCL 20 MG PO TABS
20.0000 mg | ORAL_TABLET | Freq: Two times a day (BID) | ORAL | Status: DC
  Administered 2015-02-28 – 2015-03-02 (×5): 20 mg via ORAL
  Filled 2015-02-28 (×6): qty 1

## 2015-02-28 MED ORDER — LIDOCAINE HCL (CARDIAC) 20 MG/ML IV SOLN
INTRAVENOUS | Status: DC | PRN
  Administered 2015-02-28: 100 mg via INTRATRACHEAL

## 2015-02-28 MED ORDER — PROPOFOL 10 MG/ML IV BOLUS
INTRAVENOUS | Status: AC
  Filled 2015-02-28: qty 40

## 2015-02-28 MED ORDER — ONDANSETRON HCL 4 MG PO TABS: 4.0000 mg | ORAL_TABLET | ORAL | Status: DC | PRN

## 2015-02-28 MED ORDER — LABETALOL HCL 5 MG/ML IV SOLN: 10.0000 mg | INTRAVENOUS | Status: DC | PRN

## 2015-02-28 MED ORDER — NAPHAZOLINE-PHENIRAMINE 0.025-0.3 % OP SOLN: 1.0000 [drp] | Freq: Every day | OPHTHALMIC | Status: DC | PRN

## 2015-02-28 MED ORDER — HYDROMORPHONE HCL 1 MG/ML IJ SOLN: 0.2500 mg | INTRAMUSCULAR | Status: DC | PRN

## 2015-02-28 MED ORDER — PHENYLEPHRINE HCL 10 MG/ML IJ SOLN
10.0000 mg | INTRAVENOUS | Status: DC | PRN
  Administered 2015-02-28: 10 ug/min via INTRAVENOUS

## 2015-02-28 MED ORDER — THROMBIN 20000 UNITS EX SOLR
CUTANEOUS | Status: DC | PRN
  Administered 2015-02-28: 20 mL via TOPICAL

## 2015-02-28 MED ORDER — BISACODYL 10 MG RE SUPP: 10.0000 mg | Freq: Every day | RECTAL | Status: DC | PRN

## 2015-02-28 MED ORDER — MEPERIDINE HCL 25 MG/ML IJ SOLN: 6.2500 mg | INTRAMUSCULAR | Status: DC | PRN

## 2015-02-28 MED ORDER — ONDANSETRON HCL 4 MG/2ML IJ SOLN: 4.0000 mg | INTRAMUSCULAR | Status: DC | PRN

## 2015-02-28 MED ORDER — ONDANSETRON HCL 4 MG PO TABS
4.0000 mg | ORAL_TABLET | Freq: Four times a day (QID) | ORAL | Status: DC
Start: 2015-02-28 — End: 2015-03-02
  Administered 2015-02-28 – 2015-03-02 (×9): 4 mg via ORAL
  Filled 2015-02-28 (×9): qty 1

## 2015-02-28 MED ORDER — SODIUM CHLORIDE 0.9 % IR SOLN
Status: DC | PRN
  Administered 2015-02-28: 500 mL

## 2015-02-28 MED ORDER — STERILE WATER FOR IRRIGATION IR SOLN
Status: DC | PRN
  Administered 2015-02-28: 1000 mL

## 2015-02-28 MED ORDER — PANCRELIPASE (LIP-PROT-AMYL) 12000-38000 UNITS PO CPEP
36000.0000 [IU] | ORAL_CAPSULE | ORAL | Status: DC | PRN
  Filled 2015-02-28: qty 3

## 2015-02-28 MED ORDER — PANTOPRAZOLE SODIUM 40 MG IV SOLR
40.0000 mg | Freq: Every day | INTRAVENOUS | Status: DC
  Administered 2015-02-28 – 2015-03-01 (×2): 40 mg via INTRAVENOUS
  Filled 2015-02-28 (×2): qty 40

## 2015-02-28 MED ORDER — PROPOFOL 10 MG/ML IV BOLUS
INTRAVENOUS | Status: DC | PRN
  Administered 2015-02-28: 150 mg via INTRAVENOUS

## 2015-02-28 MED ORDER — ACETAMINOPHEN 650 MG RE SUPP: 650.0000 mg | RECTAL | Status: DC | PRN

## 2015-02-28 MED ORDER — CEFAZOLIN SODIUM-DEXTROSE 2-3 GM-% IV SOLR
2.0000 g | Freq: Three times a day (TID) | INTRAVENOUS | Status: AC
  Administered 2015-02-28 (×2): 2 g via INTRAVENOUS
  Filled 2015-02-28 (×3): qty 50

## 2015-02-28 MED ORDER — SODIUM CHLORIDE 0.9 % IV SOLN
500.0000 mg | Freq: Two times a day (BID) | INTRAVENOUS | Status: DC
  Administered 2015-02-28 – 2015-03-02 (×5): 500 mg via INTRAVENOUS
  Filled 2015-02-28 (×7): qty 5

## 2015-02-28 MED ORDER — PROMETHAZINE HCL 25 MG PO TABS: 12.5000 mg | ORAL_TABLET | ORAL | Status: DC | PRN

## 2015-02-28 MED ORDER — POTASSIUM CHLORIDE IN NACL 20-0.9 MEQ/L-% IV SOLN
INTRAVENOUS | Status: DC
  Administered 2015-02-28: 1000 mL via INTRAVENOUS
  Administered 2015-03-01 – 2015-03-02 (×2): via INTRAVENOUS
  Filled 2015-02-28 (×9): qty 1000

## 2015-02-28 MED ORDER — PROMETHAZINE HCL 25 MG PO TABS: 25.0000 mg | ORAL_TABLET | Freq: Four times a day (QID) | ORAL | Status: DC | PRN

## 2015-02-28 MED ORDER — DOCUSATE SODIUM 100 MG PO CAPS
100.0000 mg | ORAL_CAPSULE | Freq: Two times a day (BID) | ORAL | Status: DC
  Administered 2015-02-28 – 2015-03-02 (×5): 100 mg via ORAL
  Filled 2015-02-28 (×5): qty 1

## 2015-02-28 SURGICAL SUPPLY — 66 items
BAG DECANTER FOR FLEXI CONT (MISCELLANEOUS) ×1 IMPLANT
BIT DRILL WIRE PASS 1.3MM (BIT) IMPLANT
BLADE CLIPPER SURG NEURO (BLADE) ×1 IMPLANT
BRUSH SCRUB EZ 1% IODOPHOR (MISCELLANEOUS) ×1 IMPLANT
BRUSH SCRUB EZ PLAIN DRY (MISCELLANEOUS) ×2 IMPLANT
BUR ACORN 6.0 PRECISION (BURR) ×2 IMPLANT
BUR SPIRAL ROUTER 2.3 (BUR) IMPLANT
CANISTER SUCT 3000ML PPV (MISCELLANEOUS) ×3 IMPLANT
CLIP TI MEDIUM 6 (CLIP) IMPLANT
COVER BACK TABLE 60X90IN (DRAPES) IMPLANT
DRAIN SNY WOU 7FLT (WOUND CARE) IMPLANT
DRAPE NEUROLOGICAL W/INCISE (DRAPES) ×2 IMPLANT
DRAPE SURG 17X23 STRL (DRAPES) IMPLANT
DRAPE WARM FLUID 44X44 (DRAPE) ×2 IMPLANT
DRILL WIRE PASS 1.3MM (BIT)
ELECT CAUTERY BLADE 6.4 (BLADE) ×2 IMPLANT
ELECT REM PT RETURN 9FT ADLT (ELECTROSURGICAL) ×2
ELECTRODE REM PT RTRN 9FT ADLT (ELECTROSURGICAL) ×1 IMPLANT
EVACUATOR 1/8 PVC DRAIN (DRAIN) IMPLANT
EVACUATOR SILICONE 100CC (DRAIN) IMPLANT
GAUZE SPONGE 4X4 12PLY STRL (GAUZE/BANDAGES/DRESSINGS) ×1 IMPLANT
GAUZE SPONGE 4X4 16PLY XRAY LF (GAUZE/BANDAGES/DRESSINGS) IMPLANT
GLOVE BIO SURGEON STRL SZ8 (GLOVE) ×2 IMPLANT
GLOVE BIO SURGEON STRL SZ8.5 (GLOVE) ×2 IMPLANT
GLOVE BIOGEL PI IND STRL 7.5 (GLOVE) IMPLANT
GLOVE BIOGEL PI INDICATOR 7.5 (GLOVE) ×2
GLOVE ECLIPSE 7.5 STRL STRAW (GLOVE) ×4 IMPLANT
GLOVE EXAM NITRILE LRG STRL (GLOVE) IMPLANT
GLOVE EXAM NITRILE MD LF STRL (GLOVE) ×2 IMPLANT
GLOVE EXAM NITRILE XL STR (GLOVE) IMPLANT
GLOVE EXAM NITRILE XS STR PU (GLOVE) IMPLANT
GOWN SPEC L3 XXLG W/TWL (GOWN DISPOSABLE) ×1 IMPLANT
GOWN STRL REUS W/ TWL LRG LVL3 (GOWN DISPOSABLE) IMPLANT
GOWN STRL REUS W/ TWL XL LVL3 (GOWN DISPOSABLE) IMPLANT
GOWN STRL REUS W/TWL 2XL LVL3 (GOWN DISPOSABLE) ×1 IMPLANT
GOWN STRL REUS W/TWL LRG LVL3 (GOWN DISPOSABLE)
GOWN STRL REUS W/TWL XL LVL3 (GOWN DISPOSABLE) ×2
KIT BASIN OR (CUSTOM PROCEDURE TRAY) ×2 IMPLANT
KIT ROOM TURNOVER OR (KITS) ×2 IMPLANT
MARKER SKIN DUAL TIP RULER LAB (MISCELLANEOUS) IMPLANT
NEEDLE HYPO 22GX1.5 SAFETY (NEEDLE) ×1 IMPLANT
NS IRRIG 1000ML POUR BTL (IV SOLUTION) ×4 IMPLANT
PACK CRANIOTOMY (CUSTOM PROCEDURE TRAY) ×2 IMPLANT
PAD ARMBOARD 7.5X6 YLW CONV (MISCELLANEOUS) ×4 IMPLANT
PATTIES SURGICAL .25X.25 (GAUZE/BANDAGES/DRESSINGS) IMPLANT
PATTIES SURGICAL .5 X.5 (GAUZE/BANDAGES/DRESSINGS) IMPLANT
PATTIES SURGICAL .5 X3 (DISPOSABLE) IMPLANT
PATTIES SURGICAL 1X1 (DISPOSABLE) IMPLANT
PIN MAYFIELD SKULL DISP (PIN) ×1 IMPLANT
RUBBERBAND STERILE (MISCELLANEOUS) ×2 IMPLANT
SPONGE NEURO XRAY DETECT 1X3 (DISPOSABLE) IMPLANT
STAPLER SKIN PROX WIDE 3.9 (STAPLE) ×2 IMPLANT
SUT ETHILON 3 0 FSL (SUTURE) IMPLANT
SUT NURALON 4 0 TR CR/8 (SUTURE) ×3 IMPLANT
SUT PROLENE 6 0 BV (SUTURE) IMPLANT
SUT VIC AB 2-0 CP2 18 (SUTURE) ×2 IMPLANT
SUT VIC AB 3-0 FS2 27 (SUTURE) ×1 IMPLANT
SUT VICRYL 4-0 PS2 18IN ABS (SUTURE) IMPLANT
SYR CONTROL 10ML LL (SYRINGE) ×1 IMPLANT
TAPE CLOTH SURG 4X10 WHT LF (GAUZE/BANDAGES/DRESSINGS) ×1 IMPLANT
TOWEL OR 17X24 6PK STRL BLUE (TOWEL DISPOSABLE) ×2 IMPLANT
TOWEL OR 17X26 10 PK STRL BLUE (TOWEL DISPOSABLE) ×2 IMPLANT
TRAY FOLEY CATH 16FRSI W/METER (SET/KITS/TRAYS/PACK) ×1 IMPLANT
TRAY FOLEY W/METER SILVER 14FR (SET/KITS/TRAYS/PACK) IMPLANT
UNDERPAD 30X30 INCONTINENT (UNDERPADS AND DIAPERS) IMPLANT
WATER STERILE IRR 1000ML POUR (IV SOLUTION) ×2 IMPLANT

## 2015-02-28 NOTE — Anesthesia Preprocedure Evaluation (Addendum)
Anesthesia Evaluation  Patient identified by MRN, date of birth, ID band Patient confused    Reviewed: Allergy & Precautions, NPO status , Patient's Chart, lab work & pertinent test resultsPreop documentation limited or incomplete due to emergent nature of procedure.  Airway Mallampati: II  TM Distance: >3 FB Neck ROM: Full    Dental  (+) Poor Dentition, Dental Advisory Given   Pulmonary asthma , Current Smoker,    Pulmonary exam normal        Cardiovascular Normal cardiovascular exam     Neuro/Psych Depression    GI/Hepatic GERD  Medicated and Controlled,  Endo/Other    Renal/GU      Musculoskeletal   Abdominal   Peds  Hematology   Anesthesia Other Findings   Reproductive/Obstetrics                            Anesthesia Physical Anesthesia Plan  ASA: III and emergent  Anesthesia Plan: General   Post-op Pain Management:    Induction: Intravenous  Airway Management Planned: Oral ETT  Additional Equipment:   Intra-op Plan:   Post-operative Plan: Possible Post-op intubation/ventilation  Informed Consent: I have reviewed the patients History and Physical, chart, labs and discussed the procedure including the risks, benefits and alternatives for the proposed anesthesia with the patient or authorized representative who has indicated his/her understanding and acceptance.   Dental advisory given, History available from chart only and Only emergency history available  Plan Discussed with: CRNA, Surgeon and Anesthesiologist  Anesthesia Plan Comments:        Anesthesia Quick Evaluation

## 2015-02-28 NOTE — Transfer of Care (Signed)
Immediate Anesthesia Transfer of Care Note  Patient: [name]  Procedure(s) Performed: Procedure(s): Anita (Right)  Patient Location: PACU  Anesthesia Type:General  Level of Consciousness: lethargic  Airway & Oxygen Therapy: Patient Spontanous Breathing and Patient connected to face mask oxygen  Post-op Assessment: Report given to RN and Post -op Vital signs reviewed and stable  Post vital signs: Reviewed and stable  Last Vitals:  Filed Vitals:   02/28/15 0115 02/28/15 0130  BP: 109/71 119/79  Pulse: 55 58  Resp: 14 14    Complications: No apparent anesthesia complications

## 2015-02-28 NOTE — Anesthesia Postprocedure Evaluation (Signed)
Anesthesia Post Note  Patient: [name]  Procedure(s) Performed: Procedure(s) (LRB): Dudley (Right)  Patient location during evaluation: PACU Anesthesia Type: General Level of consciousness: awake and alert Pain management: pain level controlled Vital Signs Assessment: post-procedure vital signs reviewed and stable Respiratory status: spontaneous breathing, nonlabored ventilation, respiratory function stable and patient connected to nasal cannula oxygen Cardiovascular status: blood pressure returned to baseline and stable Postop Assessment: no signs of nausea or vomiting Anesthetic complications: no    Last Vitals:  Filed Vitals:   02/28/15 0550 02/28/15 0552  BP: 122/81   Pulse: 61   Temp:  36.5 C  Resp: 11     Last Pain:  Filed Vitals:   02/28/15 0553  PainSc: 0-No pain                 Trenace Coughlin DAVID

## 2015-02-28 NOTE — Progress Notes (Signed)
Patient ID: [name], male   DOB: March 04, 1970, 45 y.o.   MRN: [id number] Subjective:  The patient is alert and pleasant. He looks much better.  Objective: Vital signs in last 24 hours: Temp:  [97.1 F (36.2 C)-97.7 F (36.5 C)] 97.2 F (36.2 C) (01/16 0800) Pulse Rate:  [51-90] 57 (01/16 1000) Resp:  [9-19] 12 (01/16 1000) BP: (106-131)/(68-90) 123/82 mmHg (01/16 1000) SpO2:  [100 %] 100 % (01/16 1000) Weight:  [70.2 kg (154 lb 12.2 oz)-79.379 kg (175 lb)] 70.2 kg (154 lb 12.2 oz) (01/16 0552)  Intake/Output from previous day: 01/15 0701 - 01/16 0700 In: 866.3 [I.V.:766.3; IV Piggyback:100] Out: 250 [Urine:150; Blood:100] Intake/Output this shift: Total I/O In: 275 [I.V.:225; IV Piggyback:50] Out: -   Physical exam the patient is alert and pleasant. He is oriented 3. Glasgow Coma Scale 15. He is moving all 4 extremities well. His pupils are equal. His dressing is dry.  Lab Results:  Recent Labs  02/27/15 2306  WBC 3.9*  HGB 13.8  HCT 39.0  PLT 173   BMET  Recent Labs  02/27/15 2306 02/28/15 0750  NA 124* 126*  K 3.8 4.0  CL 90* 96*  CO2 22 23  GLUCOSE 91 73  BUN 11 9  CREATININE 0.96 1.03  CALCIUM 9.2 8.7*    Studies/Results: Ct Head Wo Contrast  02/28/2015  CLINICAL DATA:  Found in car after 24 hours. Altered mental status. Incontinence. History of alcoholism and narcotic abuse. EXAM: CT HEAD WITHOUT CONTRAST TECHNIQUE: Contiguous axial images were obtained from the base of the skull through the vertex without intravenous contrast. COMPARISON:  None. FINDINGS: Intermediate density 2.4 cm RIGHT frontal extra-axial fluid collection resultant 14 mm RIGHT to LEFT midline shift. LEFT ventricular entrapment with transependymal flow cerebral spinal fluid. No intraparenchymal hemorrhage. No acute large vascular territory infarcts. Basal cisterns are partially effaced. Ocular globes and orbital contents are unremarkable. Paranasal sinuses and mastoid air cells  are well aerated. No skull fracture. IMPRESSION: 2.4 cm RIGHT frontal extra-axial fluid collection could represent subdural hematoma or possible empyema. 14 mm RIGHT to LEFT subfalcine herniation with LEFT ventricular entrapment. Acute findings discussed with and reconfirmed by Dr.Little on 02/28/2015 at 1:08 am. Electronically Signed   By: Elon Alas M.D.   On: 02/28/2015 01:09    Assessment/Plan: Status post bur holes for subdural hematoma: The patient is doing much better clinically. We will advance his diet.  Hyponatremia: It's a bit better today. This may have been secondary to the subdural hematoma. I'll check his sodium tomorrow.  LOS: 0 days     Granvil Djordjevic D 02/28/2015, 10:39 AM

## 2015-02-28 NOTE — Consult Note (Signed)
Reason for Consult: Decreased mental status Referring Physician: Dr. Little  [name] is an 44 y.o. male.  HPI: The patient is a 44-year-old black male who by report was noted to be sleeping in his car in a gas station parking lot for greater than 24 hours. Police and EMS were called. The patient was brought to [place] emerged department where he was evaluated by Dr. Little. The evaluation included a head CT which demonstrated a right subacute subdural hematoma with significant midline shift. I discussed the situation with the patient's mother via the telephone and his father and brother in person. We discussed the various treatment options and I recommended surgery. They have consented on behalf of the patient.  Presently the patient is not able to speak. He is also hyponatremic. 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 He has no known drug allergies.  Past Medical History  Diagnosis Date  . Asthma   . Pancreatitis   . Rectal polyp 01/09/2006    Hyperplastic  . Anemia   . PTSD (post-traumatic stress disorder)     controlled  . GERD (gastroesophageal reflux disease)   . Abdominal pain     intermittent    Past Surgical History  Procedure Laterality Date  . Hernia repair    . Arm surgery Left     repair of tendons "work injury cut"-"pinky finger numb"  . Esophagogastroduodenoscopy endoscopy    . Esophagogastroduodenoscopy (egd) with propofol N/A 01/13/2015    Procedure: ESOPHAGOGASTRODUODENOSCOPY (EGD) WITH PROPOFOL;  Surgeon: Daniel P Jacobs, MD;  Location: [facility];  Service: Endoscopy;  Laterality: N/A;    Family History  Problem Relation Age of Onset  . Stroke Mother   . Hypertension Mother   . Cancer Other   . Diabetes Other     Social History:  reports that he has been smoking Cigarettes.  He has been smoking about 0.25 packs per day. He has never used smokeless tobacco.  He reports that he uses illicit drugs (Marijuana). He reports that he does not drink alcohol.  Allergies:  Allergies  Allergen Reactions  . Aspirin Shortness Of Breath and Swelling  . Ibuprofen Shortness Of Breath and Swelling  . Hydrocodone Hives and Itching    Medications:  I have reviewed the patient's current medications. Prior to Admission:  (Not in a hospital admission) Scheduled: Continuous: PRN: Anti-infectives    None       Results for orders placed or performed during the hospital encounter of 02/27/15 (from the past 48 hour(s))  CBG monitoring, ED     Status: None   Collection Time: 02/27/15 10:47 PM  Result Value Ref Range   Glucose-Capillary 98 65 - 99 mg/dL  Comprehensive metabolic panel     Status: Abnormal   Collection Time: 02/27/15 11:06 PM  Result Value Ref Range   Sodium 124 (L) 135 - 145 mmol/L   Potassium 3.8 3.5 - 5.1 mmol/L   Chloride 90 (L) 101 - 111 mmol/L   CO2 22 22 - 32 mmol/L   Glucose, Bld 91 65 - 99 mg/dL   BUN 11 6 - 20 mg/dL   Creatinine, Ser 0.96 0.61 - 1.24 mg/dL   Calcium 9.2 8.9 - 10.3 mg/dL   Total Protein 7.1 6.5 - 8.1 g/dL   Albumin 3.9 3.5 - 5.0 g/dL   AST 24 15 - 41 U/L   ALT 12 (L) 17 - 63   U/L   Alkaline Phosphatase 50 38 - 126 U/L   Total Bilirubin 0.7 0.3 - 1.2 mg/dL   GFR calc non Af Amer >60 >60 mL/min   GFR calc Af Amer >60 >60 mL/min    Comment: (NOTE) The eGFR has been calculated using the CKD EPI equation. This calculation has not been validated in all clinical situations. eGFR's persistently <60 mL/min signify possible Chronic Kidney Disease.    Anion gap 12 5 - 15  CBC     Status: Abnormal   Collection Time: 02/27/15 11:06 PM  Result Value Ref Range   WBC 3.9 (L) 4.0 - 10.5 K/uL   RBC 4.53 4.22 - 5.81 MIL/uL   Hemoglobin 13.8 13.0 - 17.0 g/dL   HCT 39.0 39.0 - 52.0 %   MCV 86.1 78.0 - 100.0 fL   MCH 30.5 26.0 - 34.0 pg   MCHC 35.4 30.0 - 36.0 g/dL   RDW 12.8 11.5 - 15.5 %   Platelets 173 150 - 400  K/uL  Ethanol     Status: None   Collection Time: 02/27/15 11:06 PM  Result Value Ref Range   Alcohol, Ethyl (B) <5 <5 mg/dL    Comment:        LOWEST DETECTABLE LIMIT FOR SERUM ALCOHOL IS 5 mg/dL FOR MEDICAL PURPOSES ONLY   Salicylate level     Status: None   Collection Time: 02/27/15 11:06 PM  Result Value Ref Range   Salicylate Lvl <4.0 2.8 - 30.0 mg/dL  Acetaminophen level     Status: Abnormal   Collection Time: 02/27/15 11:06 PM  Result Value Ref Range   Acetaminophen (Tylenol), Serum <10 (L) 10 - 30 ug/mL    Comment:        THERAPEUTIC CONCENTRATIONS VARY SIGNIFICANTLY. A RANGE OF 10-30 ug/mL MAY BE AN EFFECTIVE CONCENTRATION FOR MANY PATIENTS. HOWEVER, SOME ARE BEST TREATED AT CONCENTRATIONS OUTSIDE THIS RANGE. ACETAMINOPHEN CONCENTRATIONS >150 ug/mL AT 4 HOURS AFTER INGESTION AND >50 ug/mL AT 12 HOURS AFTER INGESTION ARE OFTEN ASSOCIATED WITH TOXIC REACTIONS.   CK     Status: None   Collection Time: 02/27/15 11:06 PM  Result Value Ref Range   Total CK 199 49 - 397 U/L  Protime-INR     Status: Abnormal   Collection Time: 02/27/15 11:06 PM  Result Value Ref Range   Prothrombin Time 15.7 (H) 11.6 - 15.2 seconds   INR 1.23 0.00 - 1.49  APTT     Status: Abnormal   Collection Time: 02/27/15 11:06 PM  Result Value Ref Range   aPTT 48 (H) 24 - 37 seconds    Comment:        IF BASELINE aPTT IS ELEVATED, SUGGEST PATIENT RISK ASSESSMENT BE USED TO DETERMINE APPROPRIATE ANTICOAGULANT THERAPY.   I-Stat CG4 Lactic Acid, ED     Status: None   Collection Time: 02/27/15 11:11 PM  Result Value Ref Range   Lactic Acid, Venous 1.27 0.5 - 2.0 mmol/L  I-Stat Venous Blood Gas, ED (order at [facility] and [facility] only)     Status: Abnormal   Collection Time: 02/27/15 11:11 PM  Result Value Ref Range   pH, Ven 7.473 (H) 7.250 - 7.300   pCO2, Ven 28.7 (L) 45.0 - 50.0 mmHg   pO2, Ven 21.0 (LL) 30.0 - 45.0 mmHg   Bicarbonate 21.1 20.0 - 24.0 mEq/L   TCO2 22 0 - 100 mmol/L   O2  Saturation 40.0 %   Acid-base deficit 1.0 0.0 - 2.0 mmol/L  Patient temperature HIDE    Sample type VENOUS    Comment NOTIFIED PHYSICIAN   Ammonia     Status: None   Collection Time: 02/27/15 11:16 PM  Result Value Ref Range   Ammonia 22 9 - 35 umol/L  I-Stat CG4 Lactic Acid, ED     Status: None   Collection Time: 02/28/15  1:32 AM  Result Value Ref Range   Lactic Acid, Venous 1.32 0.5 - 2.0 mmol/L    Ct Head Wo Contrast  02/28/2015  CLINICAL DATA:  Found in car after 24 hours. Altered mental status. Incontinence. History of alcoholism and narcotic abuse. EXAM: CT HEAD WITHOUT CONTRAST TECHNIQUE: Contiguous axial images were obtained from the base of the skull through the vertex without intravenous contrast. COMPARISON:  None. FINDINGS: Intermediate density 2.4 cm RIGHT frontal extra-axial fluid collection resultant 14 mm RIGHT to LEFT midline shift. LEFT ventricular entrapment with transependymal flow cerebral spinal fluid. No intraparenchymal hemorrhage. No acute large vascular territory infarcts. Basal cisterns are partially effaced. Ocular globes and orbital contents are unremarkable. Paranasal sinuses and mastoid air cells are well aerated. No skull fracture. IMPRESSION: 2.4 cm RIGHT frontal extra-axial fluid collection could represent subdural hematoma or possible empyema. 14 mm RIGHT to LEFT subfalcine herniation with LEFT ventricular entrapment. Acute findings discussed with and reconfirmed by Dr.Little on 02/28/2015 at 1:08 am. Electronically Signed   By: Elon Alas M.D.   On: 02/28/2015 01:09    ROS: Unobtainable Blood pressure 119/79, pulse 58, resp. rate 14, height 6' 2" (1.88 m), weight 79.379 kg (175 lb), SpO2 100 %. Physical Exam  General: A somnolent but arousable 45 year old thin black male in no apparent distress.  HEENT: Normocephalic, atraumatic, pupils equal round reactive light. He has conjugate gaze.  Neck: Supple without masses or deformities.  Thorax:  Symmetric  Abdomen: Soft  Neurologic exam: Glasgow Coma Scale 10, E4M5V1. The patient is somnolent but arousable. He does not follow commands. He is left hemiparetic. He seems to localize on the right. His pupils are equal as above. He is a phasic.  Imaging studies: I have reviewed the patient's head CT performed at [facility] this morning. He has a right isodense extra-axial fluid collection with significant midline shift consistent with a subacute subdural hematoma.  Assessment/Plan: Right subdural hematoma, hyponatremia: I have discussed the situation with the patient's mother via telephone and the patient's father and brother in person. I have recommended he undergo right bur holes, and possibly craniotomy, for evacuation of the presumed subdural hematoma. I have described this procedure. We have discussed the risks including the risks of anesthesia, hemorrhage, infection, seizures, stroke, recurrent subdural hematoma, medical risk, etc. I have answered all their questions. The patient's mother has consented via the telephone and the patient's father in person. We are awaiting the OR.  Hyponatremia: Noted. This may be secondary to his alcohol abuse or the hematoma.  Sincere Berlanga D 02/28/2015, 1:59 AM

## 2015-02-28 NOTE — Progress Notes (Signed)
Pt following commands but still disoriented.  Difficult to assess cognition due to sedation and slow response

## 2015-02-28 NOTE — Op Note (Signed)
Brief history: The patient is a 45 year old black male who was found unresponsive in a gas station parking lot. He was brought to [facility] and a head CT was obtained. This demonstrated the patient had a large right subacute subdural hematoma. He was also hyponatremic. I discussed the situation with the patient's family. I recommended surgery. They have weighed the risks, benefits, and alternative surgery and consented on behalf of the patient.  Preop diagnosis: Right subdural hematoma, hyponatremia  Postop diagnosis: The same  Procedure: Right frontal and parietal bur holes for evacuation of subdural hematoma  Surgeon: Dr. Earle Gell  Assistant: None  Anesthesia: Gen. endotracheal  Estimated blood loss: 50 mL  Specimens: None  Drains: None  Complications: None  Description of procedure: The patient was brought to the operating room by the anesthesia team. General endotracheal anesthesia was induced. The patient remained in the supine position. I applied the [place] 3. headrest to the patient's calvarium. A roll was placed under his right shoulder. His head was turned to the left. The patient's right scalp was then shaved with clippers and prepared with Betadine scrub and Betadine solution. Sterile drapes were applied. I then injected the area to be incised with Marcaine with epinephrine solution. I used a scalpel to make a right frontal and right parietal incision. I used the cerebellar retractors for exposure. I used a periosteal elevator to expose the underlying calvarium. I then used a high-speed drill to create a right frontal and a right parietal burr hole. I then coagulated the underlying dura with electrocautery. 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 I then irrigated front to back and back to front with saline solution until the irrigant came back Crystal clear. We did remove some more subacute clots with suction. I then laid Gelfoam over the bur  holes. We then removed the retractors. I then reapproximated the patient's galea with interrupted 2-0 Vicryl suture. I reapproximated the skin with stainless steel staples. The wound was then coated with bacitracin ointment. A sterile dressing was applied. The drapes were removed. I then removed the Mayfield 3 point headrest from the patient's calvarium. By report all sponge, instrument, and needle counts were correct at the end this case.

## 2015-02-28 NOTE — ED Notes (Signed)
Patient transported to CT 

## 2015-02-28 NOTE — Anesthesia Procedure Notes (Signed)
Procedure Name: Intubation Date/Time: 02/28/2015 2:42 AM Performed by: Maude Leriche D Pre-anesthesia Checklist: Patient identified, Emergency Drugs available, Suction available, Patient being monitored and Timeout performed Patient Re-evaluated:Patient Re-evaluated prior to inductionOxygen Delivery Method: Circle system utilized Preoxygenation: Pre-oxygenation with 100% oxygen Intubation Type: IV induction, Rapid sequence and Cricoid Pressure applied Laryngoscope Size: Miller and 2 Grade View: Grade I Tube type: Subglottic suction tube Tube size: 7.5 mm Number of attempts: 1 Airway Equipment and Method: Stylet Placement Confirmation: ETT inserted through vocal cords under direct vision,  positive ETCO2 and breath sounds checked- equal and bilateral Secured at: 23 cm Tube secured with: Tape Dental Injury: Teeth and Oropharynx as per pre-operative assessment

## 2015-02-28 NOTE — ED Notes (Signed)
Accompanied pt to ct due to restlessness. Medicated in ct. nad noted. Pt resting, updated Vera pt mother on results and sts she will come in morning. [phone]

## 2015-03-01 ENCOUNTER — Inpatient Hospital Stay (HOSPITAL_COMMUNITY): Payer: Medicare Other

## 2015-03-01 ENCOUNTER — Encounter (HOSPITAL_COMMUNITY): Payer: Self-pay | Admitting: Neurosurgery

## 2015-03-01 DIAGNOSIS — I6203 Nontraumatic chronic subdural hemorrhage: Secondary | ICD-10-CM | POA: Diagnosis not present

## 2015-03-01 LAB — BASIC METABOLIC PANEL
Anion gap: 7 (ref 5–15)
BUN: 5 mg/dL — ABNORMAL LOW (ref 6–20)
CO2: 24 mmol/L (ref 22–32)
Calcium: 8.5 mg/dL — ABNORMAL LOW (ref 8.9–10.3)
Chloride: 94 mmol/L — ABNORMAL LOW (ref 101–111)
Creatinine, Ser: 0.98 mg/dL (ref 0.61–1.24)
GFR calc Af Amer: >60 mL/min (ref >60)
GFR calc non Af Amer: >60 mL/min (ref >60)
Glucose, Bld: 79 mg/dL (ref 65–99)
Potassium: 4 mmol/L (ref 3.5–5.1)
Sodium: 125 mmol/L — ABNORMAL LOW (ref 135–145)

## 2015-03-01 MED ORDER — PNEUMOCOCCAL VAC POLYVALENT 25 MCG/0.5ML IJ INJ
0.5000 mL | INJECTION | INTRAMUSCULAR | Status: AC
  Administered 2015-03-02: 0.5 mL via INTRAMUSCULAR
  Filled 2015-03-01: qty 0.5

## 2015-03-01 NOTE — Progress Notes (Signed)
Patient ID: [name], male   DOB: 1970/06/01, 45 y.o.   MRN: [id number] Subjective:  Patient is alert and pleasant. He looks well. He is in no apparent distress.  Objective: Vital signs in last 24 hours: Temp:  [97.2 F (36.2 C)-100.8 F (38.2 C)] 98.8 F (37.1 C) (01/17 0400) Pulse Rate:  [45-84] 62 (01/17 0600) Resp:  [11-18] 12 (01/17 0700) BP: (104-140)/(61-119) 117/80 mmHg (01/17 0700) SpO2:  [88 %-100 %] 98 % (01/17 0600)  Intake/Output from previous day: 01/16 0701 - 01/17 0700 In: 2110 [I.V.:1800; IV Piggyback:310] Out: 1650 [Urine:1650] Intake/Output this shift:    Physical exam the patient is alert and oriented 3. His speech is normal. He is moving all 4 extremities well. His dressing is clean and dry.  I reviewed the patient's head CT performed at [facility] today. It looks good. There is much less subdural hematoma and midline shift.  Lab Results:  Recent Labs  02/27/15 2306  WBC 3.9*  HGB 13.8  HCT 39.0  PLT 173   BMET  Recent Labs  02/28/15 0750 03/01/15 0315  NA 126* 125*  K 4.0 4.0  CL 96* 94*  CO2 23 24  GLUCOSE 73 79  BUN 9 5*  CREATININE 1.03 0.98  CALCIUM 8.7* 8.5*    Studies/Results: Ct Head Wo Contrast  03/01/2015  CLINICAL DATA:  45 year old male with subdural hematoma post drainage. Follow-up. Subsequent encounter. EXAM: CT HEAD WITHOUT CONTRAST TECHNIQUE: Contiguous axial images were obtained from the base of the skull through the vertex without intravenous contrast. COMPARISON:  02/27/2014. FINDINGS: Post right convexity burr-hole procedure for drainage of subdural hematoma with prominent gas throughout the scalp. Small amount of right hemisphere pneumocephalus. Decrease in size but incomplete clearance of broad-based right-sided subdural hematoma now with maximal thickness of 1.3 cm versus prior 2.4 cm. Decreased mass effect upon the right lateral ventricle with midline shift to the left by 8.3 mm versus prior 14.2 mm.  Slight decrease in size of left lateral ventricle. No interval development of large acute thrombotic infarct. No intracranial mass lesion noted on this unenhanced exam. IMPRESSION: Post right convexity burr-hole procedure for drainage of subdural hematoma with prominent gas throughout the scalp. Small amount of right hemisphere pneumocephalus. Decrease in size but incomplete clearance of broad-based right-sided subdural hematoma now with maximal thickness of 1.3 cm versus prior 2.4 cm. Decreased mass effect upon the right lateral ventricle with midline shift to the left by 8.3 mm versus prior 14.2 mm. Slight decrease in size of left lateral ventricle. Electronically Signed   By: Lacy Duverney M.D.   On: 03/01/2015 07:18   Ct Head Wo Contrast  02/28/2015  CLINICAL DATA:  Found in car after 24 hours. Altered mental status. Incontinence. History of alcoholism and narcotic abuse. EXAM: CT HEAD WITHOUT CONTRAST TECHNIQUE: Contiguous axial images were obtained from the base of the skull through the vertex without intravenous contrast. COMPARISON:  None. FINDINGS: Intermediate density 2.4 cm RIGHT frontal extra-axial fluid collection resultant 14 mm RIGHT to LEFT midline shift. LEFT ventricular entrapment with transependymal flow cerebral spinal fluid. No intraparenchymal hemorrhage. No acute large vascular territory infarcts. Basal cisterns are partially effaced. Ocular globes and orbital contents are unremarkable. Paranasal sinuses and mastoid air cells are well aerated. No skull fracture. IMPRESSION: 2.4 cm RIGHT frontal extra-axial fluid collection could represent subdural hematoma or possible empyema. 14 mm RIGHT to LEFT subfalcine herniation with LEFT ventricular entrapment. Acute findings discussed with and reconfirmed by Dr.Little  on 02/28/2015 at 1:08 am. Electronically Signed   By: Awilda Metro M.D.   On: 02/28/2015 01:09    Assessment/Plan: Status post bur holes for subdural hematoma: The patient is  doing well. I will transfer him to the floor. He will likely go home tomorrow.  LOS: 1 day     Khriz Liddy D 03/01/2015, 7:50 AM

## 2015-03-01 NOTE — Care Management Note (Signed)
Case Management Note  Patient Details  Name: [name] MRN: [id number] Date of Birth: 05/07/1970  Subjective/Objective:    Pt admitted on 02/27/15 with [facility] s/p burr holes.  PTA, pt independent of ADLS.                  Action/Plan: Will follow for discharge planning as pt progresses.   Expected Discharge Date:                  Expected Discharge Plan:  Home/Self Care  In-House Referral:     Discharge planning Services  CM Consult  Post Acute Care Choice:    Choice offered to:     DME Arranged:    DME Agency:     HH Arranged:    HH Agency:     Status of Service:  In process, will continue to follow  Medicare Important Message Given:    Date Medicare IM Given:    Medicare IM give by:    Date Additional Medicare IM Given:    Additional Medicare Important Message give by:     If discussed at [place] of Stay Meetings, dates discussed:    Additional Comments:  Reinaldo Raddle, RN, BSN  Trauma/Neuro ICU Case Manager [phone]

## 2015-03-02 DIAGNOSIS — I6203 Nontraumatic chronic subdural hemorrhage: Secondary | ICD-10-CM | POA: Diagnosis not present

## 2015-03-02 LAB — BASIC METABOLIC PANEL
Anion gap: 6 (ref 5–15)
BUN: <5 mg/dL — ABNORMAL LOW (ref 6–20)
CO2: 26 mmol/L (ref 22–32)
Calcium: 8.5 mg/dL — ABNORMAL LOW (ref 8.9–10.3)
Chloride: 96 mmol/L — ABNORMAL LOW (ref 101–111)
Creatinine, Ser: 1 mg/dL (ref 0.61–1.24)
GFR calc Af Amer: >60 mL/min (ref >60)
GFR calc non Af Amer: >60 mL/min (ref >60)
Glucose, Bld: 119 mg/dL — ABNORMAL HIGH (ref 65–99)
Potassium: 3.8 mmol/L (ref 3.5–5.1)
Sodium: 128 mmol/L — ABNORMAL LOW (ref 135–145)

## 2015-03-02 MED ORDER — LEVETIRACETAM 500 MG PO TABS: 500.0000 mg | ORAL_TABLET | Freq: Two times a day (BID) | ORAL | Status: DC

## 2015-03-02 MED ORDER — PANTOPRAZOLE SODIUM 40 MG PO TBEC: 40.0000 mg | DELAYED_RELEASE_TABLET | Freq: Every day | ORAL | Status: DC

## 2015-03-02 MED ORDER — OXYCODONE-ACETAMINOPHEN 10-325 MG PO TABS: 1.0000 | ORAL_TABLET | ORAL | Status: DC | PRN

## 2015-03-02 NOTE — Discharge Summary (Signed)
Physician Discharge Summary  Patient ID: Arslan Copus MRN: [id number] DOB/AGE: 1970-12-14 45 y.o.  Admit date: 02/27/2015 Discharge date: 03/02/2015  Admission Diagnoses: Right subdural hematoma, hyponatremia  Discharge Diagnoses: The same Active Problems:   Subdural hematoma [facility])   Discharged Condition: good  Hospital Course: I performed a right bur holes for evacuation of subdural hematoma on the patient on 02/27/2015.  The patient's postoperative course was unremarkable. His neurologic has improved dramatically after surgery. His follow-up head CT looked good. His hyponatremia is improving.  On 03/02/2015 the patient requested discharge to home. He tells me his girlfriend is going to stay with them. He was given written and oral discharge instructions. All his questions were answered.  Consults: None Significant Diagnostic Studies: Head CT Treatments: Right bur holes for evacuation of subdural hematoma Discharge Exam: Blood pressure 113/67, pulse 65, temperature 98.5 F (36.9 C), temperature source Tympanic, resp. rate 20, height 6\' 2"  (1.88 m), weight 70.2 kg (154 lb 12.2 oz), SpO2 100 %. Patient is alert and oriented 3. His wounds are healing well. His speech is normal. His strength is normal. Sodium is 128  Disposition: Home  Discharge Instructions    Call MD for:  difficulty breathing, headache or visual disturbances    Complete by:  As directed      Call MD for:  extreme fatigue    Complete by:  As directed      Call MD for:  hives    Complete by:  As directed      Call MD for:  persistant dizziness or light-headedness    Complete by:  As directed      Call MD for:  persistant nausea and vomiting    Complete by:  As directed      Call MD for:  redness, tenderness, or signs of infection (pain, swelling, redness, odor or green/yellow discharge around incision site)    Complete by:  As directed      Call MD for:  severe uncontrolled pain    Complete by:  As  directed      Call MD for:  temperature >100.4    Complete by:  As directed      Diet - low sodium heart healthy    Complete by:  As directed      Discharge instructions    Complete by:  As directed   Call [phone] for a followup appointment. Take a stool softener while you are using pain medications.     Driving Restrictions    Complete by:  As directed   Do not drive for 2 weeks.     Increase activity slowly    Complete by:  As directed      Lifting restrictions    Complete by:  As directed   Do not lift more than 5 pounds. No excessive bending or twisting.     May shower / Bathe    Complete by:  As directed   He may shower after the pain she is removed 3 days after surgery. Leave the incision alone.     No dressing needed    Complete by:  As directed             Medication List    TAKE these medications        bismuth-metronidazole-tetracycline 140-125-125 MG capsule  Commonly known as:  PYLERA  Take 3 capsules by mouth 4 (four) times daily -  before meals and at bedtime.     dicyclomine  20 MG tablet  Commonly known as:  BENTYL  Take 1 tablet (20 mg total) by mouth 2 (two) times daily.     lipase/protease/amylase 36000 UNITS Cpep capsule  Commonly known as:  CREON  Take 2 tab with snacks and 3 tab with     omeprazole 40 MG capsule  Commonly known as:  PRILOSEC  Take 1 capsule (40 mg total) by mouth daily.     ondansetron 4 MG tablet  Commonly known as:  ZOFRAN  Take 1 tablet (4 mg total) by mouth every 6 (six) hours.     oxyCODONE-acetaminophen 5-325 MG tablet  Commonly known as:  PERCOCET/ROXICET  Take 1 tablet by mouth every 6 (six) hours as needed for severe pain.     oxyCODONE-acetaminophen 10-325 MG tablet  Commonly known as:  PERCOCET  Take 1 tablet by mouth every 4 (four) hours as needed for pain.     promethazine 25 MG tablet  Commonly known as:  PHENERGAN  Take 1 tablet (25 mg total) by mouth every 6 (six) hours as needed for nausea or  vomiting.         SignedOphelia Charter 03/02/2015, 4:54 PM

## 2015-03-02 NOTE — Progress Notes (Signed)
CM consulted for assistance with patients medications. Patient has Medicare part A and B. CM asked patient if he had considered Medicare part D to assist with his medications or if he qualifies for Medicaid. Patient states he thought he was supposed to have Medicaid. CM encouraged him to check into this with DSS. CM looked into the medications that patient is currently taking and may discharge home with. Patients Bentyl is on the $4 list for [employer] and CM was able to provide the patient with some discount coupons for his other medications. His Keppra and Creon had discounts for the year if the patient calls and registers with the company (information provided to the patient). CM will continue to follow for further discharge needs.

## 2015-03-02 NOTE — Progress Notes (Signed)
Pt discharged home. Pt removed IV and telemetry. Discharge instructions given. Pt transported off unit via wheelchair at 1800. Wendee Copp

## 2015-03-05 ENCOUNTER — Inpatient Hospital Stay (HOSPITAL_COMMUNITY)
Admission: EM | Admit: 2015-03-05 | Discharge: 2015-03-07 | DRG: 101 | Disposition: A | Discharge status: Home / Self Care | Payer: Medicare Other | Attending: Internal Medicine | Admitting: Internal Medicine

## 2015-03-05 ENCOUNTER — Emergency Department (HOSPITAL_COMMUNITY): Payer: Medicare Other

## 2015-03-05 ENCOUNTER — Encounter (HOSPITAL_COMMUNITY): Payer: Self-pay | Admitting: Emergency Medicine

## 2015-03-05 DIAGNOSIS — S065XAA Traumatic subdural hemorrhage with loss of consciousness status unknown, initial encounter: Secondary | ICD-10-CM | POA: Diagnosis present

## 2015-03-05 DIAGNOSIS — F17210 Nicotine dependence, cigarettes, uncomplicated: Secondary | ICD-10-CM | POA: Diagnosis present

## 2015-03-05 DIAGNOSIS — Z886 Allergy status to analgesic agent status: Secondary | ICD-10-CM | POA: Diagnosis not present

## 2015-03-05 DIAGNOSIS — K219 Gastro-esophageal reflux disease without esophagitis: Secondary | ICD-10-CM | POA: Diagnosis present

## 2015-03-05 DIAGNOSIS — G4089 Other seizures: Principal | ICD-10-CM | POA: Diagnosis present

## 2015-03-05 DIAGNOSIS — R569 Unspecified convulsions: Secondary | ICD-10-CM | POA: Diagnosis present

## 2015-03-05 DIAGNOSIS — K860 Alcohol-induced chronic pancreatitis: Secondary | ICD-10-CM | POA: Diagnosis present

## 2015-03-05 DIAGNOSIS — Z8679 Personal history of other diseases of the circulatory system: Secondary | ICD-10-CM | POA: Diagnosis not present

## 2015-03-05 DIAGNOSIS — R1013 Epigastric pain: Secondary | ICD-10-CM

## 2015-03-05 DIAGNOSIS — D649 Anemia, unspecified: Secondary | ICD-10-CM | POA: Diagnosis present

## 2015-03-05 DIAGNOSIS — R739 Hyperglycemia, unspecified: Secondary | ICD-10-CM | POA: Diagnosis present

## 2015-03-05 DIAGNOSIS — K861 Other chronic pancreatitis: Secondary | ICD-10-CM | POA: Diagnosis present

## 2015-03-05 DIAGNOSIS — G40109 Localization-related (focal) (partial) symptomatic epilepsy and epileptic syndromes with simple partial seizures, not intractable, without status epilepticus: Secondary | ICD-10-CM | POA: Diagnosis not present

## 2015-03-05 DIAGNOSIS — Z9889 Other specified postprocedural states: Secondary | ICD-10-CM | POA: Diagnosis not present

## 2015-03-05 DIAGNOSIS — F1010 Alcohol abuse, uncomplicated: Secondary | ICD-10-CM | POA: Diagnosis present

## 2015-03-05 DIAGNOSIS — Z87898 Personal history of other specified conditions: Secondary | ICD-10-CM | POA: Insufficient documentation

## 2015-03-05 DIAGNOSIS — E871 Hypo-osmolality and hyponatremia: Secondary | ICD-10-CM

## 2015-03-05 DIAGNOSIS — R4182 Altered mental status, unspecified: Secondary | ICD-10-CM | POA: Diagnosis not present

## 2015-03-05 DIAGNOSIS — S065X9A Traumatic subdural hemorrhage with loss of consciousness of unspecified duration, initial encounter: Secondary | ICD-10-CM | POA: Diagnosis present

## 2015-03-05 DIAGNOSIS — F4310 Post-traumatic stress disorder, unspecified: Secondary | ICD-10-CM | POA: Diagnosis present

## 2015-03-05 DIAGNOSIS — R7309 Other abnormal glucose: Secondary | ICD-10-CM | POA: Diagnosis present

## 2015-03-05 DIAGNOSIS — G8929 Other chronic pain: Secondary | ICD-10-CM | POA: Diagnosis present

## 2015-03-05 DIAGNOSIS — I6203 Nontraumatic chronic subdural hemorrhage: Secondary | ICD-10-CM | POA: Diagnosis not present

## 2015-03-05 LAB — RAPID URINE DRUG SCREEN, HOSP PERFORMED
Amphetamines: NOT DETECTED
Barbiturates: NOT DETECTED
Benzodiazepines: NOT DETECTED
Cocaine: NOT DETECTED
Opiates: NOT DETECTED
Tetrahydrocannabinol: POSITIVE — AB

## 2015-03-05 LAB — CBC WITH DIFFERENTIAL/PLATELET
Basophils Absolute: 0 10*3/uL (ref 0.0–0.1)
Basophils Relative: 0 %
Eosinophils Absolute: 0 10*3/uL (ref 0.0–0.7)
Eosinophils Relative: 0 %
HCT: 35.4 % — ABNORMAL LOW (ref 39.0–52.0)
Hemoglobin: 12.3 g/dL — ABNORMAL LOW (ref 13.0–17.0)
Lymphocytes Relative: 24 %
Lymphs Abs: 1.3 10*3/uL (ref 0.7–4.0)
MCH: 30.4 pg (ref 26.0–34.0)
MCHC: 34.7 g/dL (ref 30.0–36.0)
MCV: 87.6 fL (ref 78.0–100.0)
Monocytes Absolute: 0.5 10*3/uL (ref 0.1–1.0)
Monocytes Relative: 10 %
Neutro Abs: 3.5 10*3/uL (ref 1.7–7.7)
Neutrophils Relative %: 66 %
Platelets: 231 10*3/uL (ref 150–400)
RBC: 4.04 MIL/uL — ABNORMAL LOW (ref 4.22–5.81)
RDW: 12.9 % (ref 11.5–15.5)
WBC: 5.4 10*3/uL (ref 4.0–10.5)

## 2015-03-05 LAB — URINALYSIS, ROUTINE W REFLEX MICROSCOPIC
Bilirubin Urine: NEGATIVE
Glucose, UA: >1000 mg/dL — AB
Hgb urine dipstick: NEGATIVE
Ketones, ur: NEGATIVE mg/dL
Leukocytes, UA: NEGATIVE
Nitrite: NEGATIVE
Protein, ur: NEGATIVE mg/dL
Specific Gravity, Urine: 1.022 (ref 1.005–1.030)
pH: 7 (ref 5.0–8.0)

## 2015-03-05 LAB — COMPREHENSIVE METABOLIC PANEL
ALT: 14 U/L — ABNORMAL LOW (ref 17–63)
AST: 27 U/L (ref 15–41)
Albumin: 3.3 g/dL — ABNORMAL LOW (ref 3.5–5.0)
Alkaline Phosphatase: 53 U/L (ref 38–126)
Anion gap: 14 (ref 5–15)
BUN: 8 mg/dL (ref 6–20)
CO2: 19 mmol/L — ABNORMAL LOW (ref 22–32)
Calcium: 8.9 mg/dL (ref 8.9–10.3)
Chloride: 91 mmol/L — ABNORMAL LOW (ref 101–111)
Creatinine, Ser: 1.07 mg/dL (ref 0.61–1.24)
GFR calc Af Amer: >60 mL/min (ref >60)
GFR calc non Af Amer: >60 mL/min (ref >60)
Glucose, Bld: 301 mg/dL — ABNORMAL HIGH (ref 65–99)
Potassium: 4.5 mmol/L (ref 3.5–5.1)
Sodium: 124 mmol/L — ABNORMAL LOW (ref 135–145)
Total Bilirubin: 1 mg/dL (ref 0.3–1.2)
Total Protein: 6.8 g/dL (ref 6.5–8.1)

## 2015-03-05 LAB — APTT: aPTT: 41 seconds — ABNORMAL HIGH (ref 24–37)

## 2015-03-05 LAB — I-STAT TROPONIN, ED: Troponin i, poc: 0.01 ng/mL (ref 0.00–0.08)

## 2015-03-05 LAB — URINE MICROSCOPIC-ADD ON
Bacteria, UA: NONE SEEN
RBC / HPF: NONE SEEN RBC/hpf (ref 0–5)
WBC, UA: NONE SEEN WBC/hpf (ref 0–5)

## 2015-03-05 LAB — ETHANOL: Alcohol, Ethyl (B): <5 mg/dL (ref <5)

## 2015-03-05 LAB — PROTIME-INR
INR: 1.25 (ref 0.00–1.49)
Prothrombin Time: 15.8 seconds — ABNORMAL HIGH (ref 11.6–15.2)

## 2015-03-05 MED ORDER — THIAMINE HCL 100 MG/ML IJ SOLN: 500.0000 mg | Freq: Three times a day (TID) | INTRAMUSCULAR | Status: DC

## 2015-03-05 MED ORDER — LORAZEPAM 2 MG/ML IJ SOLN: 1.0000 mg | Freq: Four times a day (QID) | INTRAMUSCULAR | Status: DC | PRN

## 2015-03-05 MED ORDER — FOLIC ACID 1 MG PO TABS
1.0000 mg | ORAL_TABLET | Freq: Every day | ORAL | Status: DC
  Administered 2015-03-06: 1 mg via ORAL
  Filled 2015-03-05: qty 1

## 2015-03-05 MED ORDER — SODIUM CHLORIDE 0.9 % IV SOLN
1000.0000 mg | Freq: Once | INTRAVENOUS | Status: AC
  Administered 2015-03-05: 1000 mg via INTRAVENOUS
  Filled 2015-03-05: qty 10

## 2015-03-05 MED ORDER — LORAZEPAM 1 MG PO TABS: 1.0000 mg | ORAL_TABLET | Freq: Four times a day (QID) | ORAL | Status: DC | PRN

## 2015-03-05 MED ORDER — ADULT MULTIVITAMIN W/MINERALS CH
1.0000 | ORAL_TABLET | Freq: Every day | ORAL | Status: DC
  Administered 2015-03-06: 1 via ORAL
  Filled 2015-03-05: qty 1

## 2015-03-05 MED ORDER — SODIUM CHLORIDE 0.9 % IJ SOLN
3.0000 mL | Freq: Two times a day (BID) | INTRAMUSCULAR | Status: DC
  Administered 2015-03-05 – 2015-03-07 (×3): 3 mL via INTRAVENOUS

## 2015-03-05 MED ORDER — THIAMINE HCL 100 MG/ML IJ SOLN
500.0000 mg | Freq: Three times a day (TID) | INTRAVENOUS | Status: DC
  Administered 2015-03-05 – 2015-03-06 (×2): 500 mg via INTRAVENOUS
  Filled 2015-03-05 (×4): qty 5

## 2015-03-05 MED ORDER — ENOXAPARIN SODIUM 40 MG/0.4ML ~~LOC~~ SOLN
40.0000 mg | SUBCUTANEOUS | Status: DC
  Filled 2015-03-05: qty 0.4

## 2015-03-05 MED ORDER — OXYCODONE-ACETAMINOPHEN 5-325 MG PO TABS
1.0000 | ORAL_TABLET | Freq: Four times a day (QID) | ORAL | Status: DC | PRN
  Administered 2015-03-06 (×2): 1 via ORAL
  Filled 2015-03-05 (×2): qty 1

## 2015-03-05 MED ORDER — SODIUM CHLORIDE 0.9 % IV BOLUS (SEPSIS)
1000.0000 mL | Freq: Once | INTRAVENOUS | Status: AC
  Administered 2015-03-05: 1000 mL via INTRAVENOUS

## 2015-03-05 NOTE — ED Notes (Signed)
Attempted report X1

## 2015-03-05 NOTE — ED Provider Notes (Signed)
CSN: [id number]     Arrival date & time    History   First MD Initiated Contact with Patient 03/05/15 1925     Chief Complaint  Patient presents with  . Altered Mental Status     (Consider location/radiation/quality/duration/timing/severity/associated sxs/prior Treatment) HPI   Low 5 caveat applies. Patient with recent subdural hematoma and evacuation. Comes in by EMS for altered mental status since yesterday per family. Patient has been less responsive than normal.  Past Medical History  Diagnosis Date  . Asthma   . Pancreatitis   . Rectal polyp 01/09/2006    Hyperplastic  . Anemia   . PTSD (post-traumatic stress disorder)     controlled  . GERD (gastroesophageal reflux disease)   . Abdominal pain     intermittent   Past Surgical History  Procedure Laterality Date  . Hernia repair    . Arm surgery Left     repair of tendons "work injury cut"-"pinky finger numb"  . Esophagogastroduodenoscopy endoscopy    . Esophagogastroduodenoscopy (egd) with propofol N/A 01/13/2015    Procedure: ESOPHAGOGASTRODUODENOSCOPY (EGD) WITH PROPOFOL;  Surgeon: [name], MD;  Location: [facility];  Service: Endoscopy;  Laterality: N/A;  Trudee Kuster hole Right 02/28/2015    Procedure: BURR HOLES FOR SUBDURAL HEMATOMA;  Surgeon: Newman Pies, MD;  Location: [place] [facility];  Service: Neurosurgery;  Laterality: Right;   Family History  Problem Relation Age of Onset  . Stroke Mother   . Hypertension Mother   . Cancer Other   . Diabetes Other    Social History  Substance Use Topics  . Smoking status: Current Some Day Smoker -- 0.25 packs/day    Types: Cigarettes  . Smokeless tobacco: Never Used     Comment: 6 cigs per week  . Alcohol Use: No    Review of Systems  Unable to perform ROS: Mental status change      Allergies  Aspirin; Ibuprofen; and Hydrocodone  Home Medications   Prior to Admission medications   Medication Sig Start Date End Date Taking? Authorizing Provider   bismuth-metronidazole-tetracycline [facility]) [phone] MG capsule Take 3 capsules by mouth 4 (four) times daily -  before meals and at bedtime. Patient not taking: Reported on 02/28/2015 02/03/15   [name], MD  dicyclomine (BENTYL) 20 MG tablet Take 1 tablet (20 mg total) by mouth 2 (two) times daily. Patient not taking: Reported on 02/28/2015 09/17/14   Domenic Moras, PA-C  lipase/protease/amylase (CREON) 36000 UNITS CPEP capsule Take 2 tab with snacks and 3 tab with Patient not taking: Reported on 02/28/2015 07/02/14   Willia Craze, NP  omeprazole (PRILOSEC) 40 MG capsule Take 1 capsule (40 mg total) by mouth daily. 01/13/15   [name], MD  ondansetron (ZOFRAN) 4 MG tablet Take 1 tablet (4 mg total) by mouth every 6 (six) hours. Patient not taking: Reported on 02/28/2015 05/27/14   Dahlia Bailiff, PA-C  oxyCODONE-acetaminophen (PERCOCET) 10-325 MG tablet Take 1 tablet by mouth every 4 (four) hours as needed for pain. 03/02/15   Newman Pies, MD  oxyCODONE-acetaminophen (PERCOCET/ROXICET) 5-325 MG per tablet Take 1 tablet by mouth every 6 (six) hours as needed for severe pain. Patient not taking: Reported on 02/28/2015 07/23/14   Dalia Heading, PA-C  promethazine (PHENERGAN) 25 MG tablet Take 1 tablet (25 mg total) by mouth every 6 (six) hours as needed for nausea or vomiting. Patient not taking: Reported on 02/28/2015 09/17/14   Domenic Moras, PA-C   BP 109/61  mmHg  Pulse 82  Temp(Src) 98.8 F (37.1 C) (Oral)  Resp 16  SpO2 100% Physical Exam  Constitutional: He appears well-developed and well-nourished. No distress.  HENT:  Head: Normocephalic and atraumatic.  Mouth/Throat: Oropharynx is clear and moist. No oropharyngeal exudate.  Postsurgical incisions to the right side of the skull with staples intact.  Eyes: EOM are normal. Pupils are equal, round, and reactive to light.  Neck: Normal range of motion. Neck supple.  No meningismus  Cardiovascular: Regular rhythm.  Exam reveals  no gallop and no friction rub.   No murmur heard. Tachycardia  Pulmonary/Chest: Effort normal and breath sounds normal. No respiratory distress. He has no wheezes. He has no rales.  Abdominal: Soft. Bowel sounds are normal. He exhibits no distension and no mass. There is no tenderness. There is no rebound and no guarding.  Musculoskeletal: Normal range of motion. He exhibits no edema or tenderness.  Neurological:  Patient is awake and intermittently following commands. Appears to be blankly staring not making eye contact. Appears to be moving all extremities.  Skin: Skin is warm and dry. No rash noted. No erythema.  Nursing note and vitals reviewed.   ED Course  Procedures (including critical care time) Labs Review Labs Reviewed  CBC WITH DIFFERENTIAL/PLATELET - Abnormal; Notable for the following:    RBC 4.04 (*)    Hemoglobin 12.3 (*)    HCT 35.4 (*)    All other components within normal limits  COMPREHENSIVE METABOLIC PANEL - Abnormal; Notable for the following:    Sodium 124 (*)    Chloride 91 (*)    CO2 19 (*)    Glucose, Bld 301 (*)    Albumin 3.3 (*)    ALT 14 (*)    All other components within normal limits  PROTIME-INR - Abnormal; Notable for the following:    Prothrombin Time 15.8 (*)    All other components within normal limits  URINALYSIS, ROUTINE W REFLEX MICROSCOPIC (NOT AT [facility]) - Abnormal; Notable for the following:    Color, Urine AMBER (*)    Glucose, UA >1000 (*)    All other components within normal limits  URINE RAPID DRUG SCREEN, HOSP PERFORMED - Abnormal; Notable for the following:    Tetrahydrocannabinol POSITIVE (*)    All other components within normal limits  APTT - Abnormal; Notable for the following:    aPTT 41 (*)    All other components within normal limits  URINE MICROSCOPIC-ADD ON - Abnormal; Notable for the following:    Squamous Epithelial / LPF 0-5 (*)    All other components within normal limits  ETHANOL  I-STAT TROPOININ, ED     Imaging Review Ct Head Wo Contrast  03/05/2015  CLINICAL DATA:  Cough mental status EXAM: CT HEAD WITHOUT CONTRAST TECHNIQUE: Contiguous axial images were obtained from the base of the skull through the vertex without intravenous contrast. COMPARISON:  03/01/2015, 02/28/2015 FINDINGS: Right frontal and right parietal calvarial burr hole to drain a subdural hematoma. Small amount of right hemispheric pneumocephalus. Persistent small amount of subdural fluid persists measuring 10 mm in thickness compared with 13 mm of the prior exam. Right to left midline shift measuring 8 mm, unchanged in the prior exam. Ventricular size is normal. Basal cisterns are patent. There is no evidence of a cortical-based area of acute infarction. Visualized portions of the orbits are unremarkable. The visualized portions of the paranasal sinuses and mastoid air cells are unremarkable. There is no acute calvarial abnormality. IMPRESSION:  Right frontal and right parietal calvarial burr hole to drain a subdural hematoma. Small amount of right hemispheric pneumocephalus. Persistent small amount of subdural fluid persists measuring 10 mm in thickness compared with 13 mm of the prior exam. Right to left midline shift measuring 8 mm, unchanged in the prior exam. No hydrocephalus. Electronically Signed   By: Kathreen Devoid   On: 03/05/2015 19:58   I have personally reviewed and evaluated these images and lab results as part of my medical decision-making.   EKG Interpretation   Date/Time:  Saturday March 05 2015 19:28:21 EST Ventricular Rate:  126 PR Interval:  140 QRS Duration: 92 QT Interval:  358 QTC Calculation: 518 R Axis:   85 Text Interpretation:  Sinus tachycardia RSR' in V1 or V2, probably normal  variant Borderline repolarization abnormality Prolonged QT interval  Confirmed by Lita Mains  MD, Cass Edinger ([id number]) on 03/05/2015 9:41:12 PM      MDM   Final diagnoses:  Partial seizure ([place])  Hyponatremia    CT is  unchanged from previous exam. Patient is now much more alert and following commands. Heart rate has improved. Concern for possible seizure.  Family bedside. States the patient began staring blankly just prior to EMS arrival. Otherwise the patient has been in a normal mental status. Patient then had contracture of the left upper extremity. EMS was called.  Patient is at his baseline mental status. Discussed with neurology who recommends loading the patient with IV Keppra. Patient did have another similar episode while he was here in the emergency department. Noted to be hyponatremic. Given IV fluids as well. We'll discuss with hospitalist regarding admission for correction of hyponatremia and observation.  Discussed with internal medicine teaching service resident Dr. Posey Pronto. Will admit to telemetry bed under Dr. Venia Minks. Asked to have neurosurgery, Dr. Arnoldo Morale page.  Julianne Rice, MD 03/05/15 2155

## 2015-03-05 NOTE — ED Notes (Signed)
Pt to CT with this RN.

## 2015-03-05 NOTE — ED Notes (Signed)
Admitting at bedside 

## 2015-03-05 NOTE — ED Notes (Signed)
EDP at bedside  

## 2015-03-05 NOTE — H&P (Signed)
Date: 03/06/2015               Patient Name:  [name] MRN: [id number]  DOB: 1970-02-14 Age / Sex: 45 y.o., male   PCP: Tamsen Roers, MD         Medical Service: Internal Medicine Teaching Service         Attending Physician: Dr. Sid Falcon, MD    First Contact: Dr. Marijean Bravo Pager: [phone]  Second Contact: Dr. Hulen Luster  Pager: [phone]       After Hours (After 5p/  First Contact Pager: [phone]  weekends / holidays): Second Contact Pager: [phone]   Chief Complaint: AMS, seizure  History of Present Illness: [name] is a 45 yo male with asthma, chronic alcoholic pancreatitis, GERD, and recent hospitalization for chronic subdural hematoma s/p burr hole drilling by neurosurgery (discharged 03/02/15).  He presents today with AMS.  He states that he was having HAs today, and had a 20 minute episode of lightheadedness that resolved on its own.  He denies LOC, bowel control, ortongue biting, but does state he urinated on himself due to urgency.  Per family report from EDP notes, patient had been less responsive yesterday, and would enter staring/trance like states.  In the ED, he had a witness focal seizure, described as LUE contracture and staring.  There was a post-ictal period before patient returned to normal neurologic baseline.  Patient endorses polyuria, polydipsia, and nocturia.  He denies a h/o DM.  He has had intermittent cough.  He continues to smoke 1 pack about every 2 weeks for the last 10 years.  He stopped drinking alcohol 4 years ago, previously drinking 6-7 shots of vodka daily.  However, he drank 4-5 shots of liquor last weekend while watching football.  He denies drug use.  Patient previously presented after being found sitting in his car for 24 hours at a gas station.  CT head at that time demonstrated 2.4 cm subdural hematoma with 14 mm midline shift.  He underwent burr hole drilling, with reduction in midline shift to 8.3 mm.    In the ED, CT head demonstrated  stable hematoma with unchanged 8 mm midline shift.  Neurology was called, and recommended an IV Keppra load.    Meds: Current Facility-Administered Medications  Medication Dose Route Frequency Provider Last Rate Last Dose  . 0.9 %  sodium chloride infusion   Intravenous Continuous Iline Oven, MD      . enoxaparin (LOVENOX) injection 40 mg  40 mg Subcutaneous Q24H Rushil Sherrye Payor, MD      . folic acid (FOLVITE) tablet 1 mg  1 mg Oral Daily Rushil Patel V, MD      . insulin aspart (novoLOG) injection 0-9 Units  0-9 Units Subcutaneous TID WC Rushil Patel V, MD      . levETIRAcetam (KEPPRA) 500 mg in sodium chloride 0.9 % 100 mL IVPB  500 mg Intravenous Q12H Rushil Patel V, MD      . lipase/protease/amylase (CREON) capsule 36,000 Units  36,000 Units Oral TID AC Rushil Patel V, MD      . LORazepam (ATIVAN) tablet 1 mg  1 mg Oral Q6H PRN Rushil Sherrye Payor, MD       Or  . LORazepam (ATIVAN) injection 1 mg  1 mg Intravenous Q6H PRN Riccardo Dubin, MD      . multivitamin with minerals tablet 1 tablet  1 tablet Oral Daily Rushil Sherrye Payor, MD      .  oxyCODONE-acetaminophen (PERCOCET/ROXICET) 5-325 MG per tablet 1-2 tablet  1-2 tablet Oral Q6H PRN Rushil Patel V, MD      . sodium chloride 0.9 % bolus 1,000 mL  1,000 mL Intravenous Once Iline Oven, MD      . sodium chloride 0.9 % injection 3 mL  3 mL Intravenous Q12H Rushil Sherrye Payor, MD   3 mL at 03/05/15 2349  . thiamine 500mg  in normal saline (57ml) IVPB  500 mg Intravenous TID Sid Falcon, MD   500 mg at 03/05/15 2349    Allergies: Allergies as of 03/05/2015 - Review Complete 03/05/2015  Allergen Reaction Noted  . Aspirin Shortness Of Breath and Swelling 01/12/2009  . Ibuprofen Shortness Of Breath and Swelling 01/12/2009  . Hydrocodone Hives and Itching 12/30/2014   Past Medical History  Diagnosis Date  . Asthma   . Pancreatitis   . Rectal polyp 01/09/2006    Hyperplastic  . Anemia   . PTSD (post-traumatic stress disorder)      controlled  . GERD (gastroesophageal reflux disease)   . Abdominal pain     intermittent   Past Surgical History  Procedure Laterality Date  . Hernia repair    . Arm surgery Left     repair of tendons "work injury cut"-"pinky finger numb"  . Esophagogastroduodenoscopy endoscopy    . Esophagogastroduodenoscopy (egd) with propofol N/A 01/13/2015    Procedure: ESOPHAGOGASTRODUODENOSCOPY (EGD) WITH PROPOFOL;  Surgeon: [name], MD;  Location: [facility];  Service: Endoscopy;  Laterality: N/A;  Trudee Kuster hole Right 02/28/2015    Procedure: BURR HOLES FOR SUBDURAL HEMATOMA;  Surgeon: Newman Pies, MD;  Location: [place] [facility];  Service: Neurosurgery;  Laterality: Right;   Family History  Problem Relation Age of Onset  . Stroke Mother   . Hypertension Mother   . Cancer Other   . Diabetes Other    Social History   Social History  . Marital Status: Single    Spouse Name: N/A  . Number of Children: N/A  . Years of Education: N/A   Occupational History  .      Social History Main Topics  . Smoking status: Current Some Day Smoker -- 0.25 packs/day    Types: Cigarettes  . Smokeless tobacco: Never Used     Comment: 6 cigs per week  . Alcohol Use: No  . Drug Use: Yes    Special: Marijuana     Comment: Quit-none in 1 month  . Sexual Activity: Not on file   Other Topics Concern  . Not on file   Social History Narrative    Review of Systems: Pertinent items noted in HPI and remainder of comprehensive ROS otherwise negative.  Physical Exam: Blood pressure 110/64, pulse 74, temperature 98.6 F (37 C), temperature source Oral, resp. rate 16, height 6\' 2"  (1.88 m), SpO2 100 %. Physical Exam  Constitutional: He is oriented to person, place, and time and well-developed, well-nourished, and in no distress. No distress.  HENT:  Head: Normocephalic and atraumatic.  Eyes: EOM are normal.  Pigmented sclera.  Neck: No JVD present. No tracheal deviation present.    Cardiovascular: Normal rate, regular rhythm, normal heart sounds and intact distal pulses.   Pulmonary/Chest: Effort normal and breath sounds normal. No stridor. No respiratory distress. He has no wheezes.  Abdominal: Soft. He exhibits no distension. There is no tenderness. There is no rebound and no guarding.  Musculoskeletal: He exhibits no edema.  Neurological: He is alert and oriented  to person, place, and time.  CN II-XII intact to bedside testing.  Strength 5/5 in UE and LE bilaterally.  No pronator drift or dysmetria to FNF or RAM of UE.  Biceps reflexes 2+ and symmetric.  Patellar reflexes not elicited bilaterally due to patient not relaxing.  Skin: Skin is warm and dry. No rash noted. He is not diaphoretic.     Lab results: Basic Metabolic Panel:  Recent Labs  03/05/15 1957  NA 124*  K 4.5  CL 91*  CO2 19*  GLUCOSE 301*  BUN 8  CREATININE 1.07  CALCIUM 8.9   Liver Function Tests:  Recent Labs  03/05/15 1957  AST 27  ALT 14*  ALKPHOS 53  BILITOT 1.0  PROT 6.8  ALBUMIN 3.3*   No results for input(s): LIPASE, AMYLASE in the last 72 hours. No results for input(s): AMMONIA in the last 72 hours. CBC:  Recent Labs  03/05/15 1957  WBC 5.4  NEUTROABS 3.5  HGB 12.3*  HCT 35.4*  MCV 87.6  PLT 231   Cardiac Enzymes: No results for input(s): CKTOTAL, CKMB, CKMBINDEX, TROPONINI in the last 72 hours. BNP: No results for input(s): PROBNP in the last 72 hours. D-Dimer: No results for input(s): DDIMER in the last 72 hours. CBG: No results for input(s): GLUCAP in the last 72 hours. Hemoglobin A1C: No results for input(s): HGBA1C in the last 72 hours. Fasting Lipid Panel: No results for input(s): CHOL, HDL, LDLCALC, TRIG, CHOLHDL, LDLDIRECT in the last 72 hours. Thyroid Function Tests: No results for input(s): TSH, T4TOTAL, FREET4, T3FREE, THYROIDAB in the last 72 hours. Anemia Panel: No results for input(s): VITAMINB12, FOLATE, FERRITIN, TIBC, IRON,  RETICCTPCT in the last 72 hours. Coagulation:  Recent Labs  03/05/15 1957  LABPROT 15.8*  INR 1.25   Urine Drug Screen: Drugs of Abuse     Component Value Date/Time   LABOPIA NONE DETECTED 03/05/2015 2028   LABOPIA NEG 05/28/2008 0045   COCAINSCRNUR NONE DETECTED 03/05/2015 2028   COCAINSCRNUR NEG 05/28/2008 0045   LABBENZ NONE DETECTED 03/05/2015 2028   LABBENZ NEG 05/28/2008 0045   AMPHETMU NONE DETECTED 03/05/2015 2028   AMPHETMU NEG 05/28/2008 0045   THCU POSITIVE* 03/05/2015 2028   LABBARB NONE DETECTED 03/05/2015 2028    Alcohol Level:  Recent Labs  03/05/15 1957  ETH <5   Urinalysis:  Recent Labs  03/05/15 2028  COLORURINE AMBER*  LABSPEC 1.022  PHURINE 7.0  GLUCOSEU >1000*  HGBUR NEGATIVE  BILIRUBINUR NEGATIVE  KETONESUR NEGATIVE  PROTEINUR NEGATIVE  NITRITE NEGATIVE  LEUKOCYTESUR NEGATIVE   Misc. Labs:   Imaging results:  Ct Head Wo Contrast  03/05/2015  CLINICAL DATA:  Cough mental status EXAM: CT HEAD WITHOUT CONTRAST TECHNIQUE: Contiguous axial images were obtained from the base of the skull through the vertex without intravenous contrast. COMPARISON:  03/01/2015, 02/28/2015 FINDINGS: Right frontal and right parietal calvarial burr hole to drain a subdural hematoma. Small amount of right hemispheric pneumocephalus. Persistent small amount of subdural fluid persists measuring 10 mm in thickness compared with 13 mm of the prior exam. Right to left midline shift measuring 8 mm, unchanged in the prior exam. Ventricular size is normal. Basal cisterns are patent. There is no evidence of a cortical-based area of acute infarction. Visualized portions of the orbits are unremarkable. The visualized portions of the paranasal sinuses and mastoid air cells are unremarkable. There is no acute calvarial abnormality. IMPRESSION: Right frontal and right parietal calvarial burr hole to drain a subdural  hematoma. Small amount of right hemispheric pneumocephalus.  Persistent small amount of subdural fluid persists measuring 10 mm in thickness compared with 13 mm of the prior exam. Right to left midline shift measuring 8 mm, unchanged in the prior exam. No hydrocephalus. Electronically Signed   By: Kathreen Devoid   On: 03/05/2015 19:58    Other results: EKG: sinus tachycardia, prolonged QT interval (518 ms).  Assessment & Plan by Problem: Active Problems:   Subdural hematoma (HCC)   Partial seizure (HCC)   PANCREATITIS, CHRONIC   HYPERGLYCEMIA  Partial Seizure and Chronic Subdural Hematoma s/p Georganna Skeans: Patient is 6 days s/p burr hole drilling by neurosurgery for chronic subdural hematoma, presenting with partial seizure.  Symptoms most likely 2/2 irritation from resolving hematoma.  Patient also found to be mildly hyponatremic at 124, but he is also hyperglycemic. Corrected sodium is 129 and unlikely to cause seizures at that level.  He is neurologically intact without deficit at time of evaluation.  Neurology recommended Keppra load, but was not formally consulted.  Neurosurgery was notified and will see patient tomorrow.   - Keppra 500 mg BID - Recommend Neurology consult in AM.  Consider EEG - Neurosurgery to see patient - Percocet 5/325 mg, 1-2 tabs q6h PRN - [place]  Hyponatremia: Patient has continued to be hyponatremic since his last admission.  It may only be pseudohyponatremia due to hyperglycemia or possible SIADH in the setting of neurologic pathology.  He is euvolemic on exam. - [facility]  Hyperglycemia and Chronic Pancreatitis: Patient has h/o chronic alcoholic pancreatitis, and presents with polyuria, polydipsia, glucosuria, and AG metabolic acidosis.  Patient may now be de facto type I diabetic due to pancreatic failure.  Patient has not been taking his Creon. - NS 125 mL/hr for 12 hours - A1c - SSI-S - Creon  Alcohol Abuse: CIWA, thiamine, folate GERD: Prilosec  FEN/GI: - Carb modified  DVT Ppx: Lovenox  Dispo: Disposition is  deferred at this time, awaiting improvement of current medical problems. Anticipated discharge in approximately 1-2 day(s).   The patient does have a current PCP Tamsen Roers, MD) and does need an [facility] hospital follow-up appointment after discharge.  The patient does not have transportation limitations that hinder transportation to clinic appointments.  Signed: Iline Oven, MD, PhD 03/06/2015, 12:07 AM

## 2015-03-05 NOTE — ED Notes (Signed)
While in room collecting pt blood, pt now speaking. States that his head keeps throbbing and "pounding" and it pounds so hard to where he cant "think straight" Asked pt if he remembers how he got here and was not able to. Pt does not remember going to CT. Pt was A/OX4. After having a 5 min convo pt slow to respond again and staring into space

## 2015-03-05 NOTE — ED Notes (Signed)
Pt in EMS from home, pt recently had surgery for brain aneurysm, was DC 03/02/15. Per family pt has been normal up until yesterday and family noticed pt became altered. Pt in trance liked state and zones out and stares straight ahead.

## 2015-03-05 NOTE — Progress Notes (Signed)
Patient ID: [name], male   DOB: 1970/09/14, 45 y.o.   MRN: [id number] CT reviewed. Unchanged when compared to the one from 4 days ago. Overall it does not look that bad. Agree with Keppra for presumed cortical irritation which is quite common and treatment of hyponatremia. We'll follow.

## 2015-03-06 DIAGNOSIS — K219 Gastro-esophageal reflux disease without esophagitis: Secondary | ICD-10-CM

## 2015-03-06 DIAGNOSIS — R4182 Altered mental status, unspecified: Secondary | ICD-10-CM

## 2015-03-06 DIAGNOSIS — F17210 Nicotine dependence, cigarettes, uncomplicated: Secondary | ICD-10-CM

## 2015-03-06 DIAGNOSIS — E871 Hypo-osmolality and hyponatremia: Secondary | ICD-10-CM | POA: Insufficient documentation

## 2015-03-06 LAB — LACTIC ACID, PLASMA: Lactic Acid, Venous: 0.7 mmol/L (ref 0.5–2.0)

## 2015-03-06 LAB — GLUCOSE, CAPILLARY
Glucose-Capillary: 104 mg/dL — ABNORMAL HIGH (ref 65–99)
Glucose-Capillary: 99 mg/dL (ref 65–99)
Glucose-Capillary: 100 mg/dL — ABNORMAL HIGH (ref 65–99)
Glucose-Capillary: 100 mg/dL — ABNORMAL HIGH (ref 65–99)
Glucose-Capillary: 123 mg/dL — ABNORMAL HIGH (ref 65–99)

## 2015-03-06 LAB — BASIC METABOLIC PANEL
Anion gap: 7 (ref 5–15)
BUN: <5 mg/dL — ABNORMAL LOW (ref 6–20)
CO2: 26 mmol/L (ref 22–32)
Calcium: 8.7 mg/dL — ABNORMAL LOW (ref 8.9–10.3)
Chloride: 101 mmol/L (ref 101–111)
Creatinine, Ser: 0.84 mg/dL (ref 0.61–1.24)
GFR calc Af Amer: >60 mL/min (ref >60)
GFR calc non Af Amer: >60 mL/min (ref >60)
Glucose, Bld: 104 mg/dL — ABNORMAL HIGH (ref 65–99)
Potassium: 4.2 mmol/L (ref 3.5–5.1)
Sodium: 134 mmol/L — ABNORMAL LOW (ref 135–145)

## 2015-03-06 LAB — HIV ANTIBODY (ROUTINE TESTING W REFLEX): HIV Screen 4th Generation wRfx: NONREACTIVE

## 2015-03-06 LAB — SODIUM, URINE, RANDOM: Sodium, Ur: 15 mmol/L

## 2015-03-06 LAB — MAGNESIUM: Magnesium: 1.9 mg/dL (ref 1.7–2.4)

## 2015-03-06 LAB — PHOSPHORUS: Phosphorus: 3.2 mg/dL (ref 2.5–4.6)

## 2015-03-06 LAB — BETA-HYDROXYBUTYRIC ACID: Beta-Hydroxybutyric Acid: <0.05 mmol/L — ABNORMAL LOW (ref 0.05–0.27)

## 2015-03-06 LAB — OSMOLALITY, URINE: Osmolality, Ur: 69 mOsm/kg — ABNORMAL LOW (ref 300–900)

## 2015-03-06 MED ORDER — SODIUM CHLORIDE 0.9 % IV BOLUS (SEPSIS)
1000.0000 mL | Freq: Once | INTRAVENOUS | Status: AC
  Administered 2015-03-06: 1000 mL via INTRAVENOUS

## 2015-03-06 MED ORDER — PANTOPRAZOLE SODIUM 40 MG PO TBEC
40.0000 mg | DELAYED_RELEASE_TABLET | Freq: Every day | ORAL | Status: DC
  Administered 2015-03-06 – 2015-03-07 (×2): 40 mg via ORAL
  Filled 2015-03-06 (×2): qty 1

## 2015-03-06 MED ORDER — SODIUM CHLORIDE 0.9 % IV SOLN
INTRAVENOUS | Status: DC
  Administered 2015-03-06: via INTRAVENOUS

## 2015-03-06 MED ORDER — INSULIN ASPART 100 UNIT/ML ~~LOC~~ SOLN: 0.0000 [IU] | Freq: Three times a day (TID) | SUBCUTANEOUS | Status: DC

## 2015-03-06 MED ORDER — SODIUM CHLORIDE 0.9 % IV SOLN
500.0000 mg | Freq: Two times a day (BID) | INTRAVENOUS | Status: DC
  Administered 2015-03-06 – 2015-03-07 (×3): 500 mg via INTRAVENOUS
  Filled 2015-03-06 (×4): qty 5

## 2015-03-06 MED ORDER — PANCRELIPASE (LIP-PROT-AMYL) 36000-114000 UNITS PO CPEP
36000.0000 [IU] | ORAL_CAPSULE | Freq: Three times a day (TID) | ORAL | Status: DC
  Administered 2015-03-06 – 2015-03-07 (×5): 36000 [IU] via ORAL
  Filled 2015-03-06 (×7): qty 1

## 2015-03-06 NOTE — Progress Notes (Addendum)
Subjective:  [name] was seen and examined this morning. He had no complaints, no headache. No new onset weakness of loss of consciousness.   Objective: Vital signs in last 24 hours: Filed Vitals:   03/05/15 2257 03/06/15 0120 03/06/15 0659 03/06/15 1019  BP: 110/64 102/42 112/65 110/65  Pulse: 74 80 79 78  Temp: 98.6 F (37 C) 98.4 F (36.9 C) 98.9 F (37.2 C) 98.8 F (37.1 C)  TempSrc: Oral Oral Oral Oral  Resp: 16 16 16 18   Height:      SpO2: 100% 99% 100% 100%   Weight change:   Intake/Output Summary (Last 24 hours) at 03/06/15 1148 Last data filed at 03/06/15 0800  Gross per 24 hour  Intake    300 ml  Output    530 ml  Net   -230 ml   Physical Exam: General: Thin appearing man, lying in bed, NAD HEENT: Surgical scar on right forehead with stables - c/d/i, otherwise [place]/AT. EOMI, no scleral icterus. Pupils equal and round. Moist mucous membranes. Cardiovascular: RRR, no m/r/g Pulmonary: CTAB, unlabored breathing Abdominal: Soft, NT/ND. Normal bowel sounds Extremities: No clubbing, cyanosis, or edema Neurological: AAOx3. Tongue midline, face symmetric. Coordination and strength in tact Psychiatric: Normal affect and behavior.   Lab Results: Basic Metabolic Panel:  Recent Labs Lab 03/05/15 1957 03/06/15 0008 03/06/15 0245  NA 124*  --  134*  K 4.5  --  4.2  CL 91*  --  101  CO2 19*  --  26  GLUCOSE 301*  --  104*  BUN 8  --  <5*  CREATININE 1.07  --  0.84  CALCIUM 8.9  --  8.7*  MG  --  1.9  --   PHOS  --  3.2  --    Liver Function Tests:  Recent Labs Lab 02/27/15 2306 03/05/15 1957  AST 24 27  ALT 12* 14*  ALKPHOS 50 53  BILITOT 0.7 1.0  PROT 7.1 6.8  ALBUMIN 3.9 3.3*   CBC:  Recent Labs Lab 02/27/15 2306 03/05/15 1957  WBC 3.9* 5.4  NEUTROABS  --  3.5  HGB 13.8 12.3*  HCT 39.0 35.4*  MCV 86.1 87.6  PLT 173 231   CBG:  Recent Labs Lab 02/27/15 2247 03/06/15 0641 03/06/15 0655 03/06/15 1116  GLUCAP 98 104* 99 100*    Coagulation:  Recent Labs Lab 02/27/15 2306 03/05/15 1957  LABPROT 15.7* 15.8*  INR 1.23 1.25   Urine Drug Screen: Drugs of Abuse     Component Value Date/Time   LABOPIA NONE DETECTED 03/05/2015 2028   LABOPIA NEG 05/28/2008 0045   COCAINSCRNUR NONE DETECTED 03/05/2015 2028   COCAINSCRNUR NEG 05/28/2008 0045   LABBENZ NONE DETECTED 03/05/2015 2028   LABBENZ NEG 05/28/2008 0045   AMPHETMU NONE DETECTED 03/05/2015 2028   AMPHETMU NEG 05/28/2008 0045   THCU POSITIVE* 03/05/2015 2028   LABBARB NONE DETECTED 03/05/2015 2028    Alcohol Level:  Recent Labs Lab 02/27/15 2306 03/05/15 1957  ETH <5 <5   Urinalysis:  Recent Labs Lab 02/28/15 1200 03/05/15 2028  COLORURINE YELLOW AMBER*  LABSPEC 1.020 1.022  PHURINE 5.0 7.0  GLUCOSEU NEGATIVE >1000*  HGBUR MODERATE* NEGATIVE  BILIRUBINUR NEGATIVE NEGATIVE  KETONESUR 40* NEGATIVE  PROTEINUR NEGATIVE NEGATIVE  NITRITE NEGATIVE NEGATIVE  LEUKOCYTESUR NEGATIVE NEGATIVE    Studies/Results: Ct Head Wo Contrast  03/05/2015  CLINICAL DATA:  Cough mental status EXAM: CT HEAD WITHOUT CONTRAST TECHNIQUE: Contiguous axial images were obtained from the  base of the skull through the vertex without intravenous contrast. COMPARISON:  03/01/2015, 02/28/2015 FINDINGS: Right frontal and right parietal calvarial burr hole to drain a subdural hematoma. Small amount of right hemispheric pneumocephalus. Persistent small amount of subdural fluid persists measuring 10 mm in thickness compared with 13 mm of the prior exam. Right to left midline shift measuring 8 mm, unchanged in the prior exam. Ventricular size is normal. Basal cisterns are patent. There is no evidence of a cortical-based area of acute infarction. Visualized portions of the orbits are unremarkable. The visualized portions of the paranasal sinuses and mastoid air cells are unremarkable. There is no acute calvarial abnormality. IMPRESSION: Right frontal and right parietal calvarial  burr hole to drain a subdural hematoma. Small amount of right hemispheric pneumocephalus. Persistent small amount of subdural fluid persists measuring 10 mm in thickness compared with 13 mm of the prior exam. Right to left midline shift measuring 8 mm, unchanged in the prior exam. No hydrocephalus. Electronically Signed   By: Kathreen Devoid   On: 03/05/2015 19:58   Medications: I have reviewed the patient's current medications. Scheduled Meds: . folic acid  1 mg Oral Daily  . insulin aspart  0-9 Units Subcutaneous TID WC  . levETIRAcetam  500 mg Intravenous Q12H  . lipase/protease/amylase  36,000 Units Oral TID AC  . multivitamin with minerals  1 tablet Oral Daily  . pantoprazole  40 mg Oral Daily  . sodium chloride  3 mL Intravenous Q12H  . thiamine IV  500 mg Intravenous TID   Continuous Infusions:  PRN Meds:.LORazepam **OR** LORazepam, oxyCODONE-acetaminophen Assessment/Plan:  Partial Seizure status post SDH with Georganna Skeans: Seizure is likely due to cortical irritation in setting of SDH s/p surgery. Alcohol withdrawal is less likely since patient has not been consistently drinking for years. Neurosurgery is following and agrees with Keppra 500 mg IV q12h. No witnessed seizures since the ED, will re-consult neurology if they recur.  - Keppra 500 mg q12h - I appreciate that Neurosurgery following - Percocet 5/325 mg, 1-2 tabs q6h PRN - Telemetry  Hyperglycemia in setting of Chronic Pancreatitis: Patient has had one recorded elevated BG to 301 on BMET, with >1000 glucose on UA. He ate 100% of his meal this morning with CBGs ~100 post-prandial. - Creon TID before meals - Will continue to monitor to assess need, if any, for home insulin  Hyponatremia: 134 this AM. Resolved. - BMET in AM  DVT Prophylaxis: SCDs. No pharmacologic prophylaxis in setting of recent SDH and surgery  Dispo: Disposition is deferred at this time, awaiting improvement of current medical problems.  Anticipated  discharge in approximately 1-2 day(s).   The patient does have a current PCP Tamsen Roers, MD) and does not need an [facility] hospital follow-up appointment after discharge.  The patient does have transportation limitations that hinder transportation to clinic appointments.  .Services Needed at time of discharge: Y = Yes, Blank = No PT:   OT:   RN:   Equipment:   Other:     LOS: 1 day   Liberty Handy, MD 03/06/2015, 11:48 AM

## 2015-03-06 NOTE — Progress Notes (Signed)
Pt arrived to 5M10 from ED.  Pt is alert and oriented.  Oxygen and suction set up in room.  Safety measures in place. Will continue to monitor.   Fredrich Romans, RN

## 2015-03-06 NOTE — Progress Notes (Signed)
Patient ID: [name], male   DOB: 22-Nov-1970, 45 y.o.   MRN: [id number] Pt seen and examined. She has no headache. His incision looks good. He has no pronator drift. He is awake and alert and conversant. He is receiving Keppra. He was about to receive a dose of Lovenox but I have discontinued that given his recent CNS surgery and subdural hematoma. I think we should pursue other measures for DVT prophylaxis. I have spoken with the resident on call regarding this issue.

## 2015-03-07 DIAGNOSIS — K861 Other chronic pancreatitis: Secondary | ICD-10-CM

## 2015-03-07 DIAGNOSIS — F1020 Alcohol dependence, uncomplicated: Secondary | ICD-10-CM

## 2015-03-07 DIAGNOSIS — G40109 Localization-related (focal) (partial) symptomatic epilepsy and epileptic syndromes with simple partial seizures, not intractable, without status epilepticus: Secondary | ICD-10-CM

## 2015-03-07 DIAGNOSIS — I6203 Nontraumatic chronic subdural hemorrhage: Secondary | ICD-10-CM

## 2015-03-07 DIAGNOSIS — Z9889 Other specified postprocedural states: Secondary | ICD-10-CM

## 2015-03-07 DIAGNOSIS — E871 Hypo-osmolality and hyponatremia: Secondary | ICD-10-CM

## 2015-03-07 DIAGNOSIS — R739 Hyperglycemia, unspecified: Secondary | ICD-10-CM

## 2015-03-07 LAB — BASIC METABOLIC PANEL
Anion gap: 11 (ref 5–15)
BUN: 8 mg/dL (ref 6–20)
CO2: 20 mmol/L — ABNORMAL LOW (ref 22–32)
Calcium: 8.6 mg/dL — ABNORMAL LOW (ref 8.9–10.3)
Chloride: 102 mmol/L (ref 101–111)
Creatinine, Ser: 1.02 mg/dL (ref 0.61–1.24)
GFR calc Af Amer: >60 mL/min (ref >60)
GFR calc non Af Amer: >60 mL/min (ref >60)
Glucose, Bld: 77 mg/dL (ref 65–99)
Potassium: 4.6 mmol/L (ref 3.5–5.1)
Sodium: 133 mmol/L — ABNORMAL LOW (ref 135–145)

## 2015-03-07 LAB — CBC
HCT: 33.7 % — ABNORMAL LOW (ref 39.0–52.0)
Hemoglobin: 11.5 g/dL — ABNORMAL LOW (ref 13.0–17.0)
MCH: 30.4 pg (ref 26.0–34.0)
MCHC: 34.1 g/dL (ref 30.0–36.0)
MCV: 89.2 fL (ref 78.0–100.0)
Platelets: 211 10*3/uL (ref 150–400)
RBC: 3.78 MIL/uL — ABNORMAL LOW (ref 4.22–5.81)
RDW: 13.6 % (ref 11.5–15.5)
WBC: 3.6 10*3/uL — ABNORMAL LOW (ref 4.0–10.5)

## 2015-03-07 LAB — HEMOGLOBIN A1C
Hgb A1c MFr Bld: 5.7 % — ABNORMAL HIGH (ref 4.8–5.6)
Mean Plasma Glucose: 117 mg/dL

## 2015-03-07 LAB — GLUCOSE, CAPILLARY
Glucose-Capillary: 80 mg/dL (ref 65–99)
Glucose-Capillary: 97 mg/dL (ref 65–99)

## 2015-03-07 MED ORDER — LEVETIRACETAM 500 MG PO TABS: 500.0000 mg | ORAL_TABLET | Freq: Two times a day (BID) | ORAL | Status: DC

## 2015-03-07 NOTE — Evaluation (Signed)
Physical Therapy Evaluation and Discharge  Patient Details Name: [name] MRN: [id number] DOB: August 28, 1970 Today's Date: 03/07/2015   History of Present Illness  Mr. Conary is a 45yo man with asthma, chronic ETOH pancreatitis, GERD, recent SDH treated with burr hole who arrived due to AMS. Witnessed partial seizure in the ED and post-icatal stated.    Clinical Impression  Patient evaluated by Physical Therapy with no further PT needs identified. Patient with no evidence of imbalance during testing and gait. PT is signing off. Thank you for this referral.     Follow Up Recommendations No PT follow up    Equipment Recommendations  None recommended by PT    Recommendations for Other Services       Precautions / Restrictions Precautions Precautions: None      Mobility  Bed Mobility Overal bed mobility: Independent                Transfers Overall transfer level: Independent Equipment used: None                Ambulation/Gait Ambulation/Gait assistance: Independent Ambulation Distance (Feet): 150 Feet Assistive device: None Gait Pattern/deviations: WFL(Within Functional Limits)   Gait velocity interpretation: at or above normal speed for age/gender    Stairs            Wheelchair Mobility    Modified Rankin (Stroke Patients Only)       Balance Overall balance assessment: Independent         Standing balance support: No upper extremity supported Standing balance-Leahy Scale: Normal       Tandem Stance - Right Leg: 30   [place] - Eyes Opened: 30 [place] - Eyes Closed: 30 High level balance activites: Direction changes;Turns;Sudden stops;Head turns;Other (comment) (turn 360; look behind; forward reach>10") High Level Balance Comments: no deficits in balance detected             Pertinent Vitals/Pain Pain Assessment: No/denies pain    Home Living Family/patient expects to be discharged to:: Private residence Living  Arrangements: Other relatives Available Help at Discharge: Friend(s);Family Type of Home: Apartment         Home Equipment: None      Prior Function Level of Independence: Independent               Hand Dominance   Dominant Hand:  (ambidextrous)    Extremity/Trunk Assessment   Upper Extremity Assessment: Overall WFL for tasks assessed           Lower Extremity Assessment: Overall WFL for tasks assessed      Cervical / Trunk Assessment: Normal  Communication   Communication: No difficulties  Cognition Arousal/Alertness: Awake/alert Behavior During Therapy: WFL for tasks assessed/performed Overall Cognitive Status: Within Functional Limits for tasks assessed                      General Comments General comments (skin integrity, edema, etc.): Pt denies h/o imbalance prior to burr hole procedure    Exercises        Assessment/Plan    PT Assessment Patent does not need any further PT services  PT Diagnosis Difficulty walking   PT Problem List    PT Treatment Interventions     PT Goals (Current goals can be found in the Care Plan section) Acute Rehab PT Goals PT Goal Formulation: All assessment and education complete, DC therapy    Frequency     Barriers to discharge  Co-evaluation               End of Session   Activity Tolerance: Patient tolerated treatment well Patient left: in bed;with call bell/phone within reach;with family/visitor present Nurse Communication: Mobility status;Other (comment) (no PT needs)         Time: 1355-1403 PT Time Calculation (min) (ACUTE ONLY): 8 min   Charges:   PT Evaluation $PT Eval Low Complexity: 1 Procedure     PT G Codes:        Aariel Ems 2015-03-20, 2:10 PM Pager [phone]

## 2015-03-07 NOTE — Progress Notes (Signed)
Patient seen and examined. Case d/w residents in detail. I agree with findings and plan as documented in Dr. Caron Presume note.  Patient feels well. No new complaints. Seizures have resolved. C/w keppra. Neurosurgery f/u appreciated. Patient is stable for d/c home today.

## 2015-03-07 NOTE — Progress Notes (Signed)
Subjective: Pt denies any complaints this morning and feels back to his baseline.   Objective: Vital signs in last 24 hours: Filed Vitals:   03/06/15 1752 03/06/15 2041 03/07/15 0536 03/07/15 1030  BP: 106/60 114/73 114/63 105/56  Pulse: 63 76 67 86  Temp: 97.4 F (36.3 C) 98.7 F (37.1 C) 98.2 F (36.8 C) 98.3 F (36.8 C)  TempSrc: Oral Oral Oral Oral  Resp: 18 18 18 18   Height:      SpO2: 100% 100% 100% 100%   Weight change:   Intake/Output Summary (Last 24 hours) at 03/07/15 1122 Last data filed at 03/07/15 [id number]  Gross per 24 hour  Intake   1080 ml  Output   2300 ml  Net  -1220 ml   Physical Exam: General: Thin appearing man, lying in bed, NAD HEENT: Surgical scar on right forehead with stables - c/d/i, otherwise [place]/AT. EOMI, no scleral icterus. Pupils equal and round. Moist mucous membranes. Cardiovascular: RRR, no m/r/g Pulmonary: CTAB, unlabored breathing Abdominal: Soft, NT/ND. Normal bowel sounds Extremities: No clubbing, cyanosis, or edema Neurological: AAOx3. Cn2-12 grossly intact.  Psychiatric: Normal affect and behavior.   Lab Results: Basic Metabolic Panel:  Recent Labs Lab 03/06/15 0008 03/06/15 0245 03/07/15 0721  NA  --  134* 133*  K  --  4.2 4.6  CL  --  101 102  CO2  --  26 20*  GLUCOSE  --  104* 77  BUN  --  <5* 8  CREATININE  --  0.84 1.02  CALCIUM  --  8.7* 8.6*  MG 1.9  --   --   PHOS 3.2  --   --    Liver Function Tests:  Recent Labs Lab 03/05/15 1957  AST 27  ALT 14*  ALKPHOS 53  BILITOT 1.0  PROT 6.8  ALBUMIN 3.3*   CBC:  Recent Labs Lab 03/05/15 1957 03/07/15 0721  WBC 5.4 3.6*  NEUTROABS 3.5  --   HGB 12.3* 11.5*  HCT 35.4* 33.7*  MCV 87.6 89.2  PLT 231 211   CBG:  Recent Labs Lab 03/06/15 0641 03/06/15 0655 03/06/15 1116 03/06/15 1623 03/06/15 2224 03/07/15 0624  GLUCAP 104* 99 100* 100* 123* 80   Coagulation:  Recent Labs Lab 03/05/15 1957  LABPROT 15.8*  INR 1.25     Alcohol  Level:  Recent Labs Lab 03/05/15 1957  ETH <5   Urinalysis:  Recent Labs Lab 02/28/15 1200 03/05/15 2028  COLORURINE YELLOW AMBER*  LABSPEC 1.020 1.022  PHURINE 5.0 7.0  GLUCOSEU NEGATIVE >1000*  HGBUR MODERATE* NEGATIVE  BILIRUBINUR NEGATIVE NEGATIVE  KETONESUR 40* NEGATIVE  PROTEINUR NEGATIVE NEGATIVE  NITRITE NEGATIVE NEGATIVE  LEUKOCYTESUR NEGATIVE NEGATIVE    Studies/Results: Ct Head Wo Contrast  03/05/2015  CLINICAL DATA:  Cough mental status EXAM: CT HEAD WITHOUT CONTRAST TECHNIQUE: Contiguous axial images were obtained from the base of the skull through the vertex without intravenous contrast. COMPARISON:  03/01/2015, 02/28/2015 FINDINGS: Right frontal and right parietal calvarial burr hole to drain a subdural hematoma. Small amount of right hemispheric pneumocephalus. Persistent small amount of subdural fluid persists measuring 10 mm in thickness compared with 13 mm of the prior exam. Right to left midline shift measuring 8 mm, unchanged in the prior exam. Ventricular size is normal. Basal cisterns are patent. There is no evidence of a cortical-based area of acute infarction. Visualized portions of the orbits are unremarkable. The visualized portions of the paranasal sinuses and mastoid air cells are unremarkable. There  is no acute calvarial abnormality. IMPRESSION: Right frontal and right parietal calvarial burr hole to drain a subdural hematoma. Small amount of right hemispheric pneumocephalus. Persistent small amount of subdural fluid persists measuring 10 mm in thickness compared with 13 mm of the prior exam. Right to left midline shift measuring 8 mm, unchanged in the prior exam. No hydrocephalus. Electronically Signed   By: Kathreen Devoid   On: 03/05/2015 19:58   Medications: I have reviewed the patient's current medications. Scheduled Meds: . levETIRAcetam  500 mg Intravenous Q12H  . lipase/protease/amylase  36,000 Units Oral TID AC  . pantoprazole  40 mg Oral Daily  .  sodium chloride  3 mL Intravenous Q12H   Continuous Infusions:  PRN Meds:.oxyCODONE-acetaminophen Assessment/Plan:  Partial Seizure status post SDH with Georganna Skeans: Seizure is likely due to cortical irritation in setting of SDH s/p surgery.  - Keppra 500 mg q12h IV transitioned to po.  - I appreciate that Neurosurgery following - Percocet 5/325 mg, 1-2 tabs q6h PRN - Telemetry - PT eval ordered  Hyperglycemia in setting of Chronic Pancreatitis: Patient has had one recorded elevated BG to 301 on BMET, with >1000 glucose on UA. Beta hydroxybutyric acid negative. hgba1c 5.7 this admission.  - Creon TID before meals  Hyponatremia: improved, likely in due to recent cranial sx. Will have pt f/u with PCP for repeat BMET.  DVT Prophylaxis: SCDs. No pharmacologic prophylaxis in setting of recent SDH and surgery  Dispo: Discharge home today pending PT evalution.   The patient does have a current PCP Tamsen Roers, MD) and does not need an [facility] hospital follow-up appointment after discharge.  The patient does have transportation limitations that hinder transportation to clinic appointments.  .Services Needed at time of discharge: Y = Yes, Blank = No PT:   OT:   RN:   Equipment:   Other:     LOS: 2 days   Norman Herrlich, MD 03/07/2015, 11:22 AM

## 2015-03-07 NOTE — Progress Notes (Signed)
Discharge instructions reviewed with patient/family. All questions answered at this time. Transport home by family.   Shaunte Tuft, RN 

## 2015-03-07 NOTE — Discharge Instructions (Signed)
[name], it was a pleasure taking care of you in the hospital. You were here because you had a seizure, which not uncommon after after a brain surgery after a brain bleed. You will need to take a new medication, Keppra, twice a day to prevent these from happening again. It is very important that you do not drink to prevent a seizure from happening again. I encourage you to stop smoking as well.

## 2015-03-07 NOTE — Discharge Summary (Signed)
Name: [name] MRN: [id number] DOB: 12-01-1970 45 y.o. PCP: Tamsen Roers, MD  Date of Admission: 03/05/2015  7:17 PM Date of Discharge: 03/07/2015 Attending Physician: Aldine Contes, MD Discharge Diagnosis: 1. Partial Serizure 2. Subdural Hematoma status post Evacuation 3. Hyponatremia 4. Hyperglycemia 5. Chronic Pancreatitis 6. History of Alcohol Abuse  Discharge Medications:   Medication List    STOP taking these medications        promethazine 25 MG tablet  Commonly known as:  PHENERGAN      TAKE these medications        bismuth-metronidazole-tetracycline 140-125-125 MG capsule  Commonly known as:  PYLERA  Take 3 capsules by mouth 4 (four) times daily -  before meals and at bedtime.     dicyclomine 20 MG tablet  Commonly known as:  BENTYL  Take 1 tablet (20 mg total) by mouth 2 (two) times daily.     levETIRAcetam 500 MG tablet  Commonly known as:  KEPPRA  Take 1 tablet (500 mg total) by mouth 2 (two) times daily.     lipase/protease/amylase 36000 UNITS Cpep capsule  Commonly known as:  CREON  Take 2 tab with snacks and 3 tab with     omeprazole 40 MG capsule  Commonly known as:  PRILOSEC  Take 1 capsule (40 mg total) by mouth daily.     ondansetron 4 MG tablet  Commonly known as:  ZOFRAN  Take 1 tablet (4 mg total) by mouth every 6 (six) hours.     oxyCODONE-acetaminophen 10-325 MG tablet  Commonly known as:  PERCOCET  Take 1 tablet by mouth every 4 (four) hours as needed for pain.        Disposition and follow-up:   Mr.[name] was discharged from [facility] in Good condition.  At the hospital follow up visit please address:  1.  Patient was started on Keppra 500 mg BID due to witnessed seizure after a subdural hematoma. Assess for adherence and tolerability of Keppra.   2.  Labs / imaging needed at time of follow-up: BMET for sodium and glucose check. Hyponatremic on admission and had one episode of  hyperglycemia to 300s.   3.  Pending labs/ test needing follow-up: None  Follow-up Appointments:     Follow-up Information    Follow up with Tamsen Roers, MD On 03/10/2015.   Specialty:  Family Medicine   Why:  at 12:30 pm. Fax # [phone]   Contact information:   [id number] [place] [address] [phone]       Discharge Instructions: Discharge Instructions    Diet - low sodium heart healthy    Complete by:  As directed      Increase activity slowly    Complete by:  As directed           [name], it was a pleasure taking care of you in the hospital. You were here because you had a seizure, which not uncommon after after a brain surgery after a brain bleed. You will need to take a new medication, Keppra, twice a day to prevent these from happening again. It is very important that you do not drink to prevent a seizure from happening again. I encourage you to stop smoking as well.   Consultations: Treatment Team:  Newman Pies, MD  Procedures Performed:  Ct Head Wo Contrast  03/05/2015  CLINICAL DATA:  Cough mental status EXAM: CT HEAD WITHOUT CONTRAST TECHNIQUE: Contiguous axial images were  obtained from the base of the skull through the vertex without intravenous contrast. COMPARISON:  03/01/2015, 02/28/2015 FINDINGS: Right frontal and right parietal calvarial burr hole to drain a subdural hematoma. Small amount of right hemispheric pneumocephalus. Persistent small amount of subdural fluid persists measuring 10 mm in thickness compared with 13 mm of the prior exam. Right to left midline shift measuring 8 mm, unchanged in the prior exam. Ventricular size is normal. Basal cisterns are patent. There is no evidence of a cortical-based area of acute infarction. Visualized portions of the orbits are unremarkable. The visualized portions of the paranasal sinuses and mastoid air cells are unremarkable. There is no acute calvarial abnormality. IMPRESSION: Right frontal and right  parietal calvarial burr hole to drain a subdural hematoma. Small amount of right hemispheric pneumocephalus. Persistent small amount of subdural fluid persists measuring 10 mm in thickness compared with 13 mm of the prior exam. Right to left midline shift measuring 8 mm, unchanged in the prior exam. No hydrocephalus. Electronically Signed   By: Kathreen Devoid   On: 03/05/2015 19:58   Ct Head Wo Contrast  03/01/2015  CLINICAL DATA:  45 year old male with subdural hematoma post drainage. Follow-up. Subsequent encounter. EXAM: CT HEAD WITHOUT CONTRAST TECHNIQUE: Contiguous axial images were obtained from the base of the skull through the vertex without intravenous contrast. COMPARISON:  02/27/2014. FINDINGS: Post right convexity burr-hole procedure for drainage of subdural hematoma with prominent gas throughout the scalp. Small amount of right hemisphere pneumocephalus. Decrease in size but incomplete clearance of broad-based right-sided subdural hematoma now with maximal thickness of 1.3 cm versus prior 2.4 cm. Decreased mass effect upon the right lateral ventricle with midline shift to the left by 8.3 mm versus prior 14.2 mm. Slight decrease in size of left lateral ventricle. No interval development of large acute thrombotic infarct. No intracranial mass lesion noted on this unenhanced exam. IMPRESSION: Post right convexity burr-hole procedure for drainage of subdural hematoma with prominent gas throughout the scalp. Small amount of right hemisphere pneumocephalus. Decrease in size but incomplete clearance of broad-based right-sided subdural hematoma now with maximal thickness of 1.3 cm versus prior 2.4 cm. Decreased mass effect upon the right lateral ventricle with midline shift to the left by 8.3 mm versus prior 14.2 mm. Slight decrease in size of left lateral ventricle. Electronically Signed   By: Genia Del M.D.   On: 03/01/2015 07:18   Ct Head Wo Contrast  02/28/2015  CLINICAL DATA:  Found in car after 24  hours. Altered mental status. Incontinence. History of alcoholism and narcotic abuse. EXAM: CT HEAD WITHOUT CONTRAST TECHNIQUE: Contiguous axial images were obtained from the base of the skull through the vertex without intravenous contrast. COMPARISON:  None. FINDINGS: Intermediate density 2.4 cm RIGHT frontal extra-axial fluid collection resultant 14 mm RIGHT to LEFT midline shift. LEFT ventricular entrapment with transependymal flow cerebral spinal fluid. No intraparenchymal hemorrhage. No acute large vascular territory infarcts. Basal cisterns are partially effaced. Ocular globes and orbital contents are unremarkable. Paranasal sinuses and mastoid air cells are well aerated. No skull fracture. IMPRESSION: 2.4 cm RIGHT frontal extra-axial fluid collection could represent subdural hematoma or possible empyema. 14 mm RIGHT to LEFT subfalcine herniation with LEFT ventricular entrapment. Acute findings discussed with and reconfirmed by Dr.Little on 02/28/2015 at 1:08 am. Electronically Signed   By: Elon Alas M.D.   On: 02/28/2015 01:09    Admission HPI: Mr. 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 He presents today with AMS. He states that he was having HAs today, and had a 20 minute episode of lightheadedness that resolved on its own. He denies LOC, bowel control, ortongue biting, but does state he urinated on himself due to urgency. Per family report from EDP notes, patient had been less responsive yesterday, and would enter staring/trance like states. In the ED, he had a witness focal seizure, described as LUE contracture and staring. There was a post-ictal period before patient returned to normal neurologic baseline.  Patient endorses polyuria, polydipsia, and nocturia. He denies a h/o DM. He has had intermittent cough. He continues to smoke 1  pack about every 2 weeks for the last 10 years. He stopped drinking alcohol 4 years ago, previously drinking 6-7 shots of vodka daily. However, he drank 4-5 shots of liquor last weekend while watching football. He denies drug use.  Patient previously presented after being found sitting in his car for 24 hours at a gas station. CT head at that time demonstrated 2.4 cm subdural hematoma with 14 mm midline shift. He underwent burr hole drilling, with reduction in midline shift to 8.3 mm.   In the ED, CT head demonstrated stable hematoma with unchanged 8 mm midline shift. Neurology was called, and recommended an IV Keppra load.   Hospital Course by problem list: Active Problems:   PANCREATITIS, CHRONIC   HYPERGLYCEMIA   Abdominal pain, chronic, epigastric   Subdural hematoma (HCC)   Partial seizure (HCC)   Hyponatremia   Partial Seizure status post SDH with Georganna Skeans: It was surmised that his seizure was likely due to cortical irritation in setting of SDH s/p surgery. He had no additional seizure episodes during his admission. Neurosurgery followed and was comfortable with Keppra for seizure prophylaxis. Pain was managed with Percocet 5/325 mg, 1-2 tabs q6h PRN for post-surgical pain. Physical therapy recommended no PT follow-up.   Hyperglycemia in setting of Chronic Pancreatitis: Patient had one recorded elevated BG to 301 on BMET, with >1000 glucose on UA. Beta hydroxybutyric acid negative. Hgba1c 5.7 this admission. CBGs were in the 100s on a carb-modified diet, and he did not require and sliding scale insulin. He was kept on Creon TID before meals.  Hyponatremia: Patient had a Na of 124 on admission (corrected to 128 with BG in 300s). Improved with IVF to 134 and was 133 on day of discharge. Remained asymptomatic.   Discharge Vitals:   BP 105/56 mmHg  Pulse 86  Temp(Src) 98.3 F (36.8 C) (Oral)  Resp 18  Ht 6\' 2"  (1.88 m)  SpO2 100%  Discharge Labs:  Results for orders placed  or performed during the hospital encounter of 03/05/15 (from the past 24 hour(s))  Glucose, capillary     Status: Abnormal   Collection Time: 03/06/15  4:23 PM  Result Value Ref Range   Glucose-Capillary 100 (H) 65 - 99 mg/dL  Glucose, capillary     Status: Abnormal   Collection Time: 03/06/15 10:24 PM  Result Value Ref Range   Glucose-Capillary 123 (H) 65 - 99 mg/dL  Glucose, capillary     Status: None   Collection Time: 03/07/15  6:24 AM  Result Value Ref Range   Glucose-Capillary 80 65 - 99 mg/dL   Comment 1 Notify RN    Comment 2 Document in Chart   CBC     Status: Abnormal   Collection Time: 03/07/15  7:21 AM  Result Value Ref Range   WBC 3.6 (L) 4.0 - 10.5 K/uL   RBC 3.78 (L) 4.22 - 5.81 MIL/uL   Hemoglobin 11.5 (L) 13.0 - 17.0 g/dL   HCT 33.7 (L) 39.0 - 52.0 %   MCV 89.2 78.0 - 100.0 fL   MCH 30.4 26.0 - 34.0 pg   MCHC 34.1 30.0 - 36.0 g/dL   RDW 13.6 11.5 - 15.5 %   Platelets 211 150 - 400 K/uL  Basic metabolic panel     Status: Abnormal   Collection Time: 03/07/15  7:21 AM  Result Value Ref Range   Sodium 133 (L) 135 - 145 mmol/L   Potassium 4.6 3.5 - 5.1 mmol/L   Chloride 102 101 - 111 mmol/L   CO2 20 (L) 22 - 32 mmol/L   Glucose, Bld 77 65 - 99 mg/dL   BUN 8 6 - 20 mg/dL   Creatinine, Ser 1.02 0.61 - 1.24 mg/dL   Calcium 8.6 (L) 8.9 - 10.3 mg/dL   GFR calc non Af Amer >60 >60 mL/min   GFR calc Af Amer >60 >60 mL/min   Anion gap 11 5 - 15  Glucose, capillary     Status: None   Collection Time: 03/07/15 11:26 AM  Result Value Ref Range   Glucose-Capillary 97 65 - 99 mg/dL    Signed: Liberty Handy, MD 03/07/2015, 11:53 AM

## 2015-03-22 ENCOUNTER — Telehealth: Payer: Self-pay

## 2015-03-22 NOTE — Telephone Encounter (Signed)
Received a phone call from Robert. [name]. He stated that was trying to find whom should remove and when the staples were suppose to come out of his head. Robert. [name] stated had been to primary doctor and called Dr. Arnoldo Morale office. Robert. [name] stated he was getting very frustrated. I told him to hand up and I would call him back when I got it straight. I called [facility] and explained the  Situation. My call was passed on to Dr. Ophelia Charter assistance. The assistant checked Robert. Doto chart. Assistant made an appointment for [name] for Thursday, February 9 at 2 PM. I called [name] back and gave him the information about the appointment. [name] stated that knew where the office was and described where is on [address].

## 2015-04-01 ENCOUNTER — Encounter: Payer: Self-pay | Admitting: Gastroenterology

## 2015-04-01 ENCOUNTER — Ambulatory Visit (INDEPENDENT_AMBULATORY_CARE_PROVIDER_SITE_OTHER): Payer: Medicare Other | Admitting: Gastroenterology

## 2015-04-01 VITALS — BP 94/56 | HR 68 | Ht 74.0 in | Wt 168.0 lb

## 2015-04-01 DIAGNOSIS — G8929 Other chronic pain: Secondary | ICD-10-CM

## 2015-04-01 DIAGNOSIS — R109 Unspecified abdominal pain: Secondary | ICD-10-CM | POA: Diagnosis not present

## 2015-04-01 NOTE — Patient Instructions (Addendum)
You will be set up for an upper endoscopy to check on gastric ulcer healing ([place]).

## 2015-04-01 NOTE — Progress Notes (Signed)
Review of pertinent gastrointestinal problems: 1. Chronic abdominal pains. Former Dr. [name] patient; CT scan 2008 with scattered RP LNs underwent EUS 2008 Dr. [name], node sampled and was bening. REpeat EUS at request of Dr. [name] for suspicion of chronic pancreatitis, findings on EUS showed mild changes that may be from CP, underwent CPN. Was put 'empirically' on PERT 07/2014. Repeat ED visits, 2016 for abd pain, never elevated LFTS, CBC or pancreatic enzymes.  CT scan 01/2015 showed unchanged abdominal adenopathy (compared to many years prior), eventual EGD (+ H. Pylori see below) 2. Hiatal hernia noted EGD Dr. [name] 12/2005, he questioned if severe GERD (NERD?) could be causing the symptoms. 3. Routine risk for colon cancer; Dr. [name] colonoscopy for weight loss and abdominal pain in November 2007. Multiple polyps were removed., Polyps were hyperplastic. Terminal ileal biopsies unremarkable as well 4. H. Pylori + gastric ulcer, EGD Dr. [name] 01/2015: 41mm ulcer along greater curve;  Started on appropriate antibiotics.  HPI: This is a very pleasant 45 year old man whom I last saw about 2 months ago time of an upper endoscopy  Chief complaint is gastric ulcer, still has abdominal pain.  Doesn't think the abdominal pain really improved after his antibiotic course  No changes since taking the pylera antibiobics.  Still on proton pump inhibitor twice daily  He does not take any NSAIDs   Past Medical History  Diagnosis Date  . Asthma   . Pancreatitis   . Rectal polyp 01/09/2006    Hyperplastic  . Anemia   . PTSD (post-traumatic stress disorder)     controlled  . GERD (gastroesophageal reflux disease)   . Abdominal pain     intermittent  . Aneurysm [facility])     Past Surgical History  Procedure Laterality Date  . Hernia repair    . Arm surgery Left     repair of tendons "work injury cut"-"pinky finger numb"  . Esophagogastroduodenoscopy endoscopy    .  Esophagogastroduodenoscopy (egd) with propofol N/A 01/13/2015    Procedure: ESOPHAGOGASTRODUODENOSCOPY (EGD) WITH PROPOFOL;  Surgeon: [name], MD;  Location: [facility];  Service: Endoscopy;  Laterality: N/A;  Trudee Kuster hole Right 02/28/2015    Procedure: BURR HOLES FOR SUBDURAL HEMATOMA;  Surgeon: Newman Pies, MD;  Location: [facility];  Service: Neurosurgery;  Laterality: Right;  . Cerebral aneurysm repair      Current Outpatient Prescriptions  Medication Sig Dispense Refill  . bismuth-metronidazole-tetracycline (PYLERA) 140-125-125 MG capsule Take 3 capsules by mouth 4 (four) times daily -  before meals and at bedtime. 120 capsule 0  . dicyclomine (BENTYL) 20 MG tablet Take 1 tablet (20 mg total) by mouth 2 (two) times daily. 20 tablet 0  . levETIRAcetam (KEPPRA) 500 MG tablet Take 1 tablet (500 mg total) by mouth 2 (two) times daily. 60 tablet 3  . lipase/protease/amylase (CREON) 36000 UNITS CPEP capsule Take 2 tab with snacks and 3 tab with 390 capsule 1  . omeprazole (PRILOSEC) 40 MG capsule Take 1 capsule (40 mg total) by mouth daily. 30 capsule 11  . ondansetron (ZOFRAN) 4 MG tablet Take 1 tablet (4 mg total) by mouth every 6 (six) hours. 12 tablet 0  . oxyCODONE-acetaminophen (PERCOCET) 10-325 MG tablet Take 1 tablet by mouth every 4 (four) hours as needed for pain. 50 tablet 0   No current facility-administered medications for this visit.    Allergies as of 04/01/2015 - Review Complete 04/01/2015  Allergen Reaction Noted  . Aspirin Shortness Of Breath  and Swelling 01/12/2009  . Ibuprofen Shortness Of Breath and Swelling 01/12/2009  . Hydrocodone Hives and Itching 12/30/2014    Family History  Problem Relation Age of Onset  . Stroke Mother   . Hypertension Mother   . Cancer Other   . Diabetes Other     Social History   Social History  . Marital Status: Single    Spouse Name: N/A  . Number of Children: N/A  . Years of Education: N/A   Occupational History   .      Social History Main Topics  . Smoking status: Current Some Day Smoker -- 0.25 packs/day    Types: Cigarettes  . Smokeless tobacco: Never Used     Comment: 6 cigs per week  . Alcohol Use: No  . Drug Use: Yes    Special: Marijuana     Comment: Quit-none in 1 month  . Sexual Activity: Not on file   Other Topics Concern  . Not on file   Social History Narrative     Physical Exam: BP 94/56 mmHg  Pulse 68  Ht 6\' 2"  (1.88 m)  Wt 168 lb (76.204 kg)  BMI 21.56 kg/m2 Constitutional: generally well-appearing Psychiatric: alert and oriented x3 Abdomen: soft, nontender, nondistended, no obvious ascites, no peritoneal signs, normal bowel sounds   Assessment and plan: 45 y.o. male with persistent abdominal pain after H. pylori eradication therapy for gastric ulcer, H. pylori positive  He needs repeat upper endoscopy to confirm, check for gastric ulcer healing. I will also rebiopsy the stomach to see if he truly has had eradication successfully. He'll continue on proton pump inhibitor twice daily for now.   Owens Loffler, MD [facility] 04/01/2015, 3:03 PM

## 2015-05-06 ENCOUNTER — Encounter: Payer: Self-pay | Admitting: Gastroenterology

## 2015-05-06 ENCOUNTER — Ambulatory Visit (AMBULATORY_SURGERY_CENTER): Payer: Medicare Other | Admitting: Gastroenterology

## 2015-05-06 VITALS — BP 131/74 | HR 62 | Temp 99.1°F | Resp 16 | Ht 74.0 in | Wt 168.0 lb

## 2015-05-06 DIAGNOSIS — R109 Unspecified abdominal pain: Secondary | ICD-10-CM

## 2015-05-06 DIAGNOSIS — K2970 Gastritis, unspecified, without bleeding: Secondary | ICD-10-CM

## 2015-05-06 DIAGNOSIS — B9681 Helicobacter pylori [H. pylori] as the cause of diseases classified elsewhere: Secondary | ICD-10-CM | POA: Diagnosis not present

## 2015-05-06 DIAGNOSIS — G8929 Other chronic pain: Secondary | ICD-10-CM

## 2015-05-06 DIAGNOSIS — Z8719 Personal history of other diseases of the digestive system: Secondary | ICD-10-CM

## 2015-05-06 DIAGNOSIS — K259 Gastric ulcer, unspecified as acute or chronic, without hemorrhage or perforation: Secondary | ICD-10-CM | POA: Diagnosis not present

## 2015-05-06 MED ORDER — SODIUM CHLORIDE 0.9 % IV SOLN: 500.0000 mL | INTRAVENOUS | Status: DC

## 2015-05-06 NOTE — Progress Notes (Signed)
Called to room to assist during endoscopic procedure.  Patient ID and intended procedure confirmed with present staff. Received instructions for my participation in the procedure from the performing physician.  

## 2015-05-06 NOTE — Progress Notes (Signed)
Last seizure 02/2015

## 2015-05-06 NOTE — Progress Notes (Signed)
1440 pt. Coughs continuosly.  Complains that his head hurts.  Holds head and groans.  States he had aneurysm surgery in January.  Points to right temporal area.  Dr. Sallyanne Havers of pt. Complaints by Pollyann Kennedy RN.  Will watch pt.

## 2015-05-06 NOTE — Progress Notes (Signed)
1510 Pt. Cough calm.  States headache is better. Dr. [name] in to see pt.  Will discharge.

## 2015-05-06 NOTE — Op Note (Signed)
[facility] Patient Name: [name] Procedure Date: 05/06/2015 2:03 PM MRN: [id number] Endoscopist: [name] , MD Age: 45 Referring MD:  Date of Birth: [birth year] Gender: Male Procedure:                Upper GI endoscopy Indications:              Follow-up of gasric ulcer noted 2 months ago during                            EGD for abd pain, H. pylori +, s/p eradication                            antibiotics Medicines:                Monitored Anesthesia Care Procedure:                Pre-Anesthesia Assessment:                           - Prior to the procedure, a History and Physical                            was performed, and patient medications and                            allergies were reviewed. The patient's tolerance of                            previous anesthesia was also reviewed. The risks                            and benefits of the procedure and the sedation                            options and risks were discussed with the patient.                            All questions were answered, and informed consent                            was obtained. Prior Anticoagulants: The patient has                            taken no previous anticoagulant or antiplatelet                            agents. ASA Grade Assessment: II - A patient with                            mild systemic disease. After reviewing the risks                            and benefits, the patient was deemed in  satisfactory condition to undergo the procedure.                           After obtaining informed consent, the endoscope was                            passed under direct vision. Throughout the                            procedure, the patient's blood pressure, pulse, and                            oxygen saturations were monitored continuously. The                            Model GIF-HQ190 [id number]) scope was introduced         through the mouth, and advanced to the second part                            of duodenum. The upper GI endoscopy was                            accomplished without difficulty. The patient                            tolerated the procedure well. Scope In: Scope Out: Findings:      The esophagus was normal.      Diffuse mild inflammation characterized by erythema was found in the       gastric antrum. Biopsies were taken with a cold forceps for Helicobacter       pylori testing.      The previously noted gastric ulcer has completely healed      The exam was otherwise without abnormality. Complications:            No immediate complications. Estimated Blood Loss:     Estimated blood loss: none. Impression:               - Normal esophagus.                           - Gastritis. Biopsied.                           - The previously noted gastric ulcer has completely                            healed.                           - The examination was otherwise normal. Recommendation:           - Normal esophagus.                           - Gastritis. Biopsied.                           -  The previously noted gastric ulcer has completely                            healed.                           - The examination was otherwise normal. Procedure Code(s):        --- Professional ---                           [phone], Esophagogastroduodenoscopy, flexible,                            transoral; with biopsy, single or multiple CPT copyright 2016 [employer]. All rights reserved. [name], MD 05/06/2015 2:24:11 PM This report has been signed electronically. Number of Addenda: 0 Referring MD:      Tamsen Roers

## 2015-05-06 NOTE — Patient Instructions (Addendum)
YOU HAD AN ENDOSCOPIC PROCEDURE TODAY AT THE [facility]:   Refer to the procedure report that was given to you for any specific questions about what was found during the examination.  If the procedure report does not answer your questions, please call your gastroenterologist to clarify.  If you requested that your care partner not be given the details of your procedure findings, then the procedure report has been included in a sealed envelope for you to review at your convenience later.  YOU SHOULD EXPECT: Some feelings of bloating in the abdomen. Passage of more gas than usual.  Walking can help get rid of the air that was put into your GI tract during the procedure and reduce the bloating. If you had a lower endoscopy (such as a colonoscopy or flexible sigmoidoscopy) you may notice spotting of blood in your stool or on the toilet paper. If you underwent a bowel prep for your procedure, you may not have a normal bowel movement for a few days.  Please Note:  You might notice some irritation and congestion in your nose or some drainage.  This is from the oxygen used during your procedure.  There is no need for concern and it should clear up in a day or so.  SYMPTOMS TO REPORT IMMEDIATELY:   Following lower endoscopy (colonoscopy or flexible sigmoidoscopy):  Excessive amounts of blood in the stool  Significant tenderness or worsening of abdominal pains  Swelling of the abdomen that is new, acute  Fever of 100F or higher   For urgent or emergent issues, a gastroenterologist can be reached at any hour by calling [phone].   DIET: Your first meal following the procedure should be a small meal and then it is ok to progress to your normal diet. Heavy or fried foods are harder to digest and may make you feel nauseous or bloated.  Likewise, meals heavy in dairy and vegetables can increase bloating.  Drink plenty of fluids but you should avoid alcoholic beverages for 24  hours.  ACTIVITY:  You should plan to take it easy for the rest of today and you should NOT DRIVE or use heavy machinery until tomorrow (because of the sedation medicines used during the test).    FOLLOW UP: Our staff will call the number listed on your records the next business day following your procedure to check on you and address any questions or concerns that you may have regarding the information given to you following your procedure. If we do not reach you, we will leave a message.  However, if you are feeling well and you are not experiencing any problems, there is no need to return our call.  We will assume that you have returned to your regular daily activities without incident.  If any biopsies were taken you will be contacted by phone or by letter within the next 1-3 weeks.  Please call us at [phone] if you have not heard about the biopsies in 3 weeks.    SIGNATURES/CONFIDENTIALITY: You and/or your care partner have signed paperwork which will be entered into your electronic medical record.  These signatures attest to the fact that that the information above on your After Visit Summary has been reviewed and is understood.  Full responsibility of the confidentiality of this discharge information lies with you and/or your care-partner.YOU HAD AN ENDOSCOPIC PROCEDURE TODAY AT THE [facility]:   Refer to the procedure report that was given to you for any specific   questions about what was found during the examination.  If the procedure report does not answer your questions, please call your gastroenterologist to clarify.  If you requested that your care partner not be given the details of your procedure findings, then the procedure report has been included in a sealed envelope for you to review at your convenience later.  YOU SHOULD EXPECT: Some feelings of bloating in the abdomen. Passage of more gas than usual.  Walking can help get rid of the air that was put into your GI  tract during the procedure and reduce the bloating. If you had a lower endoscopy (such as a colonoscopy or flexible sigmoidoscopy) you may notice spotting of blood in your stool or on the toilet paper. If you underwent a bowel prep for your procedure, you may not have a normal bowel movement for a few days.  Please Note:  You might notice some irritation and congestion in your nose or some drainage.  This is from the oxygen used during your procedure.  There is no need for concern and it should clear up in a day or so.  SYMPTOMS TO REPORT IMMEDIATELY:   Following lower endoscopy (colonoscopy or flexible sigmoidoscopy):  Excessive amounts of blood in the stool  Significant tenderness or worsening of abdominal pains  Swelling of the abdomen that is new, acute  Fever of 100F or higher   Following upper endoscopy (EGD)  Vomiting of blood or coffee ground material  New chest pain or pain under the shoulder blades  Painful or persistently difficult swallowing  New shortness of breath  Fever of 100F or higher  Black, tarry-looking stools  For urgent or emergent issues, a gastroenterologist can be reached at any hour by calling [phone].   DIET: Your first meal following the procedure should be a small meal and then it is ok to progress to your normal diet. Heavy or fried foods are harder to digest and may make you feel nauseous or bloated.  Likewise, meals heavy in dairy and vegetables can increase bloating.  Drink plenty of fluids but you should avoid alcoholic beverages for 24 hours.  ACTIVITY:  You should plan to take it easy for the rest of today and you should NOT DRIVE or use heavy machinery until tomorrow (because of the sedation medicines used during the test).    FOLLOW UP: Our staff will call the number listed on your records the next business day following your procedure to check on you and address any questions or concerns that you may have regarding the information given  to you following your procedure. If we do not reach you, we will leave a message.  However, if you are feeling well and you are not experiencing any problems, there is no need to return our call.  We will assume that you have returned to your regular daily activities without incident.  If any biopsies were taken you will be contacted by phone or by letter within the next 1-3 weeks.  Please call us at [phone] if you have not heard about the biopsies in 3 weeks.    SIGNATURES/CONFIDENTIALITY: You and/or your care partner have signed paperwork which will be entered into your electronic medical record.  These signatures attest to the fact that that the information above on your After Visit Summary has been reviewed and is understood.  Full responsibility of the confidentiality of this discharge information lies with you and/or your care-partner. 

## 2015-05-06 NOTE — Progress Notes (Signed)
A/ox3 pleased with MAC, report to Jane RN 

## 2015-05-09 ENCOUNTER — Telehealth: Payer: Self-pay | Admitting: *Deleted

## 2015-05-09 NOTE — Telephone Encounter (Signed)
Message left

## 2015-05-12 ENCOUNTER — Other Ambulatory Visit: Payer: Self-pay

## 2015-05-12 DIAGNOSIS — A048 Other specified bacterial intestinal infections: Secondary | ICD-10-CM

## 2015-05-12 MED ORDER — AMOXICILL-CLARITHRO-LANSOPRAZ PO MISC: Freq: Two times a day (BID) | ORAL | Status: DC

## 2016-02-21 ENCOUNTER — Encounter (HOSPITAL_COMMUNITY): Payer: Self-pay

## 2016-02-21 ENCOUNTER — Emergency Department (HOSPITAL_COMMUNITY): Payer: Medicare Other

## 2016-02-21 ENCOUNTER — Emergency Department (HOSPITAL_COMMUNITY)
Admission: EM | Admit: 2016-02-21 | Discharge: 2016-02-22 | Disposition: A | Discharge status: Home / Self Care | Payer: Medicare Other | Attending: Emergency Medicine | Admitting: Emergency Medicine

## 2016-02-21 DIAGNOSIS — Z79899 Other long term (current) drug therapy: Secondary | ICD-10-CM | POA: Insufficient documentation

## 2016-02-21 DIAGNOSIS — F17210 Nicotine dependence, cigarettes, uncomplicated: Secondary | ICD-10-CM | POA: Insufficient documentation

## 2016-02-21 DIAGNOSIS — J45909 Unspecified asthma, uncomplicated: Secondary | ICD-10-CM | POA: Diagnosis not present

## 2016-02-21 DIAGNOSIS — M79632 Pain in left forearm: Secondary | ICD-10-CM | POA: Diagnosis not present

## 2016-02-21 DIAGNOSIS — M25532 Pain in left wrist: Secondary | ICD-10-CM | POA: Diagnosis present

## 2016-02-21 NOTE — ED Triage Notes (Signed)
Pt endorses left wrist/forearm pain x 2-3 days. Denies Injury. Good CMS.

## 2016-02-22 MED ORDER — OXYCODONE-ACETAMINOPHEN 5-325 MG PO TABS
1.0000 | ORAL_TABLET | Freq: Once | ORAL | Status: AC
  Administered 2016-02-22: 1 via ORAL
  Filled 2016-02-22: qty 1

## 2016-02-22 NOTE — Discharge Instructions (Signed)
Please use ibuprofen every 6 hours as needed for pain. Please use ice compression for 15 minutes 3-5 times a day. Maintain light movement of arm throughout the day. Please follow up with primary care physician in a week as needed.  Get help right away if: You lose feeling in your fingers or hand. Your fingers turn white, very red, or cold and blue. You cannot move your fingers. You have a fever or chills.

## 2016-02-22 NOTE — ED Provider Notes (Signed)
[facility] DEPT Provider Note   CSN: [id number] Arrival date & time: 02/21/16  2043     History   Chief Complaint Chief Complaint  Patient presents with  . Wrist Pain    HPI [name] is a 46 y.o. male presenting with left wrist and forearm pain 3 days. Patient states that his symptoms have been worsening, throbbing, aching, 7/10 pain. Patient reports full range of motion, sensation intact, and full strength. Patient also states that his left forearm is tender to palpation. Patient reports trying Tylenol with no relief and also trying icing the area with mild relief. Patient denies any trauma to the area and does not know why this pain may have come from. Patient denies any previous history of injuries to the forearm. Patient denies any type of possible overuse or repetitive action with his arms. Patient denies numbness, tingling, fevers, chills, nausea, vomiting.  HPI  Past Medical History:  Diagnosis Date  . Abdominal pain    intermittent  . Anemia   . Aneurysm ([place])   . Asthma   . GERD (gastroesophageal reflux disease)   . Pancreatitis   . PTSD (post-traumatic stress disorder)    controlled  . Rectal polyp 01/09/2006   Hyperplastic  . Seizures [facility])     Patient Active Problem List   Diagnosis Date Noted  . Hyponatremia   . Partial seizure ([place]) 03/05/2015  . Seizure ([place]) 03/05/2015  . Subdural hematoma ([place]) 02/28/2015  . Abdominal pain, chronic, epigastric 07/02/2014  . ABDOMINAL PAIN, GENERALIZED 03/07/2010  . KNEE PAIN, RIGHT 11/18/2009  . HYPERGLYCEMIA 09/08/2009  . ANKLE SPRAIN, LEFT 02/05/2009  . BACK STRAIN, LUMBAR 02/05/2009  . POLYURIA 10/28/2008  . ABDOMINAL PAIN, EPIGASTRIC 10/28/2008  . INSOMNIA, CHRONIC 10/10/2007  . DEPRESSION, CHRONIC 10/10/2007  . ASTHMA 10/10/2007  . WOUND INFECTION 10/10/2007  . ALCOHOLISM 04/15/2007  . 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 PANCREATITIS, CHRONIC 04/15/2007    Past Surgical History:  Procedure  Laterality Date  . arm surgery Left    repair of tendons "work injury cut"-"pinky finger numb"  . BURR HOLE Right 02/28/2015   Procedure: BURR HOLES FOR SUBDURAL HEMATOMA;  Surgeon: Newman Pies, MD;  Location: [facility];  Service: Neurosurgery;  Laterality: Right;  . CEREBRAL ANEURYSM REPAIR    . ESOPHAGOGASTRODUODENOSCOPY (EGD) WITH PROPOFOL N/A 01/13/2015   Procedure: ESOPHAGOGASTRODUODENOSCOPY (EGD) WITH PROPOFOL;  Surgeon: [name], MD;  Location: [facility];  Service: Endoscopy;  Laterality: N/A;  . ESOPHAGOGASTRODUODENOSCOPY ENDOSCOPY    . HERNIA REPAIR    . laceration of left arm    . 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 Authorizing Provider  amoxicillin-clarithromycin-lansoprazole [facility]) combo pack Take by mouth 2 (two) times daily. Follow package directions. 05/12/15   [name], MD  bismuth-metronidazole-tetracycline [facility]) [phone] MG capsule Take 3 capsules by mouth 4 (four) times daily -  before meals and at bedtime. 02/03/15   [name], MD  [facility] B-1 100 MG tablet Take 100 mg by mouth daily. 03/10/15   Historical Provider, MD  dicyclomine (BENTYL) 20 MG tablet Take 1 tablet (20 mg total) by mouth 2 (two) times daily. 09/17/14   Domenic Moras, PA-C  folic acid (FOLVITE) 1 MG tablet Take 1 mg by mouth daily. 03/10/15   Historical Provider, MD  levETIRAcetam (KEPPRA) 500 MG tablet Take  1 tablet (500 mg total) by mouth 2 (two) times daily. 03/07/15   Liberty Handy, MD  lipase/protease/amylase (CREON) [phone] UNITS CPEP capsule Take 2 tab with snacks and 3 tab with 07/02/14   Willia Craze, NP  omeprazole (PRILOSEC) 40 MG capsule Take 1 capsule (40 mg total) by mouth daily. 01/13/15   [name], MD  ondansetron (ZOFRAN) 4 MG tablet Take 1 tablet (4 mg total) by mouth every 6 (six) hours. 05/27/14   Dahlia Bailiff, PA-C    oxyCODONE-acetaminophen (PERCOCET) 10-325 MG tablet Take 1 tablet by mouth every 4 (four) hours as needed for pain. 03/02/15   Newman Pies, MD    Family History Family History  Problem Relation Age of Onset  . Stroke Mother   . Hypertension Mother   . Pancreatic cancer Maternal Uncle   . Cancer Other   . Diabetes Other     Social History Social History  Substance Use Topics  . Smoking status: Current Some Day Smoker    Packs/day: 0.00    Types: Cigarettes  . Smokeless tobacco: Never Used  . Alcohol use No     Allergies   Aspirin; Ibuprofen; and Hydrocodone   Review of Systems Review of Systems  Constitutional: Negative for chills and fever.  Gastrointestinal: Negative for diarrhea, nausea and vomiting.  Musculoskeletal: Positive for arthralgias and myalgias. Negative for joint swelling, neck pain and neck stiffness.  Skin: Negative for color change, rash and wound.  Neurological: Negative for numbness.     Physical Exam Updated Vital Signs BP 109/66 (BP Location: Right Arm)   Pulse 69   Temp 97.7 F (36.5 C) (Oral)   Resp 18   Ht 6\' 2"  (1.88 m)   Wt 81.6 kg   SpO2 98%   BMI 23.11 kg/m   Physical Exam  Constitutional: He is oriented to person, place, and time. He appears well-developed and well-nourished.  HENT:  Head: Normocephalic and atraumatic.  Eyes: EOM are normal. Pupils are equal, round, and reactive to light.  Cardiovascular: Normal rate and intact distal pulses.   Distal pulses intact. Negative allens test.   Pulmonary/Chest: Effort normal.  Musculoskeletal: Normal range of motion. He exhibits tenderness. He exhibits no edema or deformity.  Patient's left forearm and wrist is non-erythematous. No swelling noted on exam. It is tender to palpation. Patient has full range of motion with full supination, pronation of forearm. Patient has full range of motion of wrist.  Neurological: He is alert and oriented to person, place, and time.  Patient  has sensation intact and equal bilaterally. Muscle Strength is 5/5 bilaterally.  Negative Tinel's sign. Negative Phalen sign.  Skin: Skin is warm. No rash noted. No erythema.  Psychiatric: He has a normal mood and affect. His behavior is normal.     ED Treatments / Results  Labs (all labs ordered are listed, but only abnormal results are displayed) Labs Reviewed - No data to display  EKG  EKG Interpretation None       Radiology Dg Forearm Left  Result Date: 02/21/2016 CLINICAL DATA:  46 y/o M; throbbing pain in the left forearm for 3 days. EXAM: LEFT FOREARM - 2 VIEW COMPARISON:  None. FINDINGS: There is no evidence of fracture or other focal bone lesions. Soft tissues are unremarkable. IMPRESSION: No acute fracture or dislocation identified. Electronically Signed   By: Kristine Garbe M.D.   On: 02/21/2016 22:06    Procedures Procedures (including critical care time)  Medications Ordered in  ED Medications  oxyCODONE-acetaminophen (PERCOCET/ROXICET) 5-325 MG per tablet 1 tablet (1 tablet Oral Given 02/22/16 0123)     Initial Impression / Assessment and Plan / ED Course  I have reviewed the triage vital signs and the nursing notes.  Pertinent labs & imaging results that were available during my care of the patient were reviewed by me and considered in my medical decision making (see chart for details).  Clinical Course   Patient X-Ray negative for obvious fracture or dislocation. Pain managed in ED. Pt advised to follow up with PCP for worsening pain. Conservative therapy recommended and discussed. Patient in NAD, VSS, afebrile. Patient will be dc home & is agreeable with above plan. Return precautions given. I have reviewed the [place] Controlled Substance Reporting System.   Final Clinical Impressions(s) / ED Diagnoses   Final diagnoses:  Left wrist pain  Pain of left forearm    New Prescriptions Discharge Medication List as of 02/22/2016  1:15 AM         [place], [place] 02/22/16 [id number]    Charlesetta Shanks, MD 02/23/16 1239

## 2016-02-26 ENCOUNTER — Encounter (HOSPITAL_COMMUNITY): Payer: Self-pay | Admitting: Oncology

## 2016-02-26 DIAGNOSIS — F17210 Nicotine dependence, cigarettes, uncomplicated: Secondary | ICD-10-CM | POA: Diagnosis not present

## 2016-02-26 DIAGNOSIS — R1013 Epigastric pain: Secondary | ICD-10-CM | POA: Insufficient documentation

## 2016-02-26 DIAGNOSIS — J45909 Unspecified asthma, uncomplicated: Secondary | ICD-10-CM | POA: Diagnosis not present

## 2016-02-26 DIAGNOSIS — Z79899 Other long term (current) drug therapy: Secondary | ICD-10-CM | POA: Diagnosis not present

## 2016-02-26 DIAGNOSIS — R1011 Right upper quadrant pain: Secondary | ICD-10-CM | POA: Diagnosis present

## 2016-02-26 LAB — CBC
HCT: 39.9 % (ref 39.0–52.0)
Hemoglobin: 13.3 g/dL (ref 13.0–17.0)
MCH: 30.2 pg (ref 26.0–34.0)
MCHC: 33.3 g/dL (ref 30.0–36.0)
MCV: 90.7 fL (ref 78.0–100.0)
Platelets: 158 10*3/uL (ref 150–400)
RBC: 4.4 MIL/uL (ref 4.22–5.81)
RDW: 14.4 % (ref 11.5–15.5)
WBC: 5.2 10*3/uL (ref 4.0–10.5)

## 2016-02-26 LAB — COMPREHENSIVE METABOLIC PANEL
ALT: 18 U/L (ref 17–63)
AST: 23 U/L (ref 15–41)
Albumin: 4 g/dL (ref 3.5–5.0)
Alkaline Phosphatase: 47 U/L (ref 38–126)
Anion gap: 6 (ref 5–15)
BUN: 10 mg/dL (ref 6–20)
CO2: 30 mmol/L (ref 22–32)
Calcium: 9 mg/dL (ref 8.9–10.3)
Chloride: 102 mmol/L (ref 101–111)
Creatinine, Ser: 1.1 mg/dL (ref 0.61–1.24)
GFR calc Af Amer: >60 mL/min (ref >60)
GFR calc non Af Amer: >60 mL/min (ref >60)
Glucose, Bld: 94 mg/dL (ref 65–99)
Potassium: 3.6 mmol/L (ref 3.5–5.1)
Sodium: 138 mmol/L (ref 135–145)
Total Bilirubin: 0.5 mg/dL (ref 0.3–1.2)
Total Protein: 6.7 g/dL (ref 6.5–8.1)

## 2016-02-26 LAB — LIPASE, BLOOD: Lipase: 21 U/L (ref 11–51)

## 2016-02-26 NOTE — ED Triage Notes (Signed)
Pt c/o RUQ abdominal pain x 2 days.  States he believes the pain is related to pancreatitis.  +N/V.  Rates pain 10/10, stabbing in nature.

## 2016-02-27 ENCOUNTER — Emergency Department (HOSPITAL_COMMUNITY)
Admission: EM | Admit: 2016-02-27 | Discharge: 2016-02-27 | Disposition: A | Discharge status: Home / Self Care | Payer: Medicare Other | Attending: Emergency Medicine | Admitting: Emergency Medicine

## 2016-02-27 ENCOUNTER — Emergency Department (HOSPITAL_COMMUNITY): Payer: Medicare Other

## 2016-02-27 DIAGNOSIS — R1013 Epigastric pain: Secondary | ICD-10-CM

## 2016-02-27 LAB — URINALYSIS, ROUTINE W REFLEX MICROSCOPIC
Bilirubin Urine: NEGATIVE
Glucose, UA: NEGATIVE mg/dL
Hgb urine dipstick: NEGATIVE
Ketones, ur: NEGATIVE mg/dL
Leukocytes, UA: NEGATIVE
Nitrite: NEGATIVE
Protein, ur: NEGATIVE mg/dL
Specific Gravity, Urine: 1.004 — ABNORMAL LOW (ref 1.005–1.030)
pH: 7 (ref 5.0–8.0)

## 2016-02-27 MED ORDER — ONDANSETRON HCL 4 MG/2ML IJ SOLN
4.0000 mg | Freq: Once | INTRAMUSCULAR | Status: AC
  Administered 2016-02-27: 4 mg via INTRAVENOUS
  Filled 2016-02-27: qty 2

## 2016-02-27 MED ORDER — GI COCKTAIL ~~LOC~~
30.0000 mL | Freq: Once | ORAL | Status: AC
  Administered 2016-02-27: 30 mL via ORAL
  Filled 2016-02-27: qty 30

## 2016-02-27 MED ORDER — MORPHINE SULFATE (PF) 4 MG/ML IV SOLN
4.0000 mg | Freq: Once | INTRAVENOUS | Status: AC
  Administered 2016-02-27: 4 mg via INTRAVENOUS
  Filled 2016-02-27: qty 1

## 2016-02-27 MED ORDER — SODIUM CHLORIDE 0.9 % IV BOLUS (SEPSIS)
1000.0000 mL | Freq: Once | INTRAVENOUS | Status: AC
  Administered 2016-02-27: 1000 mL via INTRAVENOUS

## 2016-02-27 NOTE — ED Provider Notes (Signed)
[facility] DEPT Provider Note   CSN: [id number] Arrival date & time: 02/26/16  1948 By signing my name below, I, Georgette Shell, attest that this documentation has been prepared under the direction and in the presence of [facility], PA-C. Electronically Signed: Georgette Shell, ED Scribe. 02/27/16. 1:08 AM.  History   Chief Complaint Chief Complaint  Patient presents with  . Abdominal Pain   The history is provided by the patient. No language interpreter was used.   HPI Comments: [name] is a 46 y.o. male with h/o asthma, chronic pancreatitis, rectal polyps, seizures, GERD, alcoholism, narcotic abuse, and PTSD, who presents to the Emergency Department complaining of throbbing, persistent 10/10, RUQ abdominal pain onset four days ago. Pt also has associated nausea and 5-6 episodes of emesis in the last 4 days. Pain is exacerbated with eating. He has h/o chronic pancreatitis and reports that this pain feels very similar. Pt denies fever, chills, diarrhea, hematochezia, or any other associated symptoms.   Past Medical History:  Diagnosis Date  . Abdominal pain    intermittent  . Anemia   . Aneurysm ([place])   . Asthma   . GERD (gastroesophageal reflux disease)   . Pancreatitis   . PTSD (post-traumatic stress disorder)    controlled  . Rectal polyp 01/09/2006   Hyperplastic  . Seizures [facility])     Patient Active Problem List   Diagnosis Date Noted  . Hyponatremia   . Partial seizure ([place]) 03/05/2015  . Seizure ([place]) 03/05/2015  . Subdural hematoma ([place]) 02/28/2015  . Abdominal pain, chronic, epigastric 07/02/2014  . ABDOMINAL PAIN, GENERALIZED 03/07/2010  . KNEE PAIN, RIGHT 11/18/2009  . HYPERGLYCEMIA 09/08/2009  . ANKLE SPRAIN, LEFT 02/05/2009  . BACK STRAIN, LUMBAR 02/05/2009  . POLYURIA 10/28/2008  . ABDOMINAL PAIN, EPIGASTRIC 10/28/2008  . INSOMNIA, CHRONIC 10/10/2007  . DEPRESSION, CHRONIC 10/10/2007  . ASTHMA 10/10/2007  . WOUND INFECTION 10/10/2007  . ALCOHOLISM  04/15/2007  . 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 PANCREATITIS, CHRONIC 04/15/2007    Past Surgical History:  Procedure Laterality Date  . arm surgery Left    repair of tendons "work injury cut"-"pinky finger numb"  . BURR HOLE Right 02/28/2015   Procedure: BURR HOLES FOR SUBDURAL HEMATOMA;  Surgeon: Newman Pies, MD;  Location: [place] NEURO ORS;  Service: Neurosurgery;  Laterality: Right;  . CEREBRAL ANEURYSM REPAIR    . ESOPHAGOGASTRODUODENOSCOPY (EGD) WITH PROPOFOL N/A 01/13/2015   Procedure: ESOPHAGOGASTRODUODENOSCOPY (EGD) WITH PROPOFOL;  Surgeon: [name], MD;  Location: [facility];  Service: Endoscopy;  Laterality: N/A;  . ESOPHAGOGASTRODUODENOSCOPY ENDOSCOPY    . HERNIA REPAIR    . laceration of left arm    . 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 Authorizing Provider  dicyclomine (BENTYL) 20 MG tablet Take 1 tablet (20 mg total) by mouth 2 (two) times daily. 09/17/14  Yes Domenic Moras, PA-C  folic acid (FOLVITE) 1 MG tablet Take 1 mg by mouth daily.   Yes Historical Provider, MD  levETIRAcetam (KEPPRA) 500 MG tablet Take 1 tablet (500 mg total) by mouth 2 (two) times daily. 03/07/15  Yes Liberty Handy, MD  lipase/protease/amylase (CREON) 36000 UNITS CPEP capsule Take 72,000-108,000 Units by mouth 5 (five) times daily. Pt takes three capsules three times daily with meals and two capsules two times daily with snacks.   Yes Historical Provider, MD  omeprazole (  PRILOSEC) 40 MG capsule Take 1 capsule (40 mg total) by mouth daily. 01/13/15  Yes [name], MD  thiamine (VITAMIN B-1) 100 MG tablet Take 100 mg by mouth daily.   Yes Historical Provider, MD    Family History Family History  Problem Relation Age of Onset  . Stroke Mother   . Hypertension Mother   . Pancreatic cancer Maternal Uncle   . Cancer Other   . Diabetes Other     Social History Social History   Substance Use Topics  . Smoking status: Current Some Day Smoker    Packs/day: 0.00    Types: Cigarettes  . Smokeless tobacco: Never Used  . Alcohol use No     Allergies   Aspirin; Ibuprofen; and Hydrocodone   Review of Systems Review of Systems  Constitutional: Negative for chills and fever.  Gastrointestinal: Positive for abdominal pain, nausea and vomiting. Negative for blood in stool.  All other systems reviewed and are negative.  Physical Exam Updated Vital Signs BP (!) 117/50 (BP Location: Left Arm)   Pulse (!) 58   Temp 98 F (36.7 C)   Resp 18   Ht 6\' 2"  (1.88 m)   Wt 84.3 kg   SpO2 100%   BMI 23.86 kg/m   Physical Exam  Constitutional: He is oriented to person, place, and time. He appears well-developed and well-nourished.  Non-toxic appearance. No distress.  HENT:  Head: Normocephalic and atraumatic.  Cardiovascular: Normal rate, regular rhythm and normal heart sounds.   No murmur heard. Pulmonary/Chest: Effort normal and breath sounds normal. No respiratory distress.  Abdominal: Soft. Bowel sounds are normal. He exhibits no distension. There is tenderness.  Generalized tenderness to palpation most significantly of the epigastrium.  Musculoskeletal: He exhibits no edema.  Neurological: He is alert and oriented to person, place, and time.  Skin: Skin is warm and dry.  Nursing note and vitals reviewed.  ED Treatments / Results  DIAGNOSTIC STUDIES: Oxygen Saturation is 100% on RA, normal by my interpretation.    COORDINATION OF CARE: 1:08 AM Discussed treatment plan with pt at bedside which includes urinalysis and IV fluids and pt agreed to plan.  Labs (all labs ordered are listed, but only abnormal results are displayed) Labs Reviewed  URINALYSIS, ROUTINE W REFLEX MICROSCOPIC - Abnormal; Notable for the following:       Result Value   Color, Urine STRAW (*)    Specific Gravity, Urine 1.004 (*)    All other components within normal limits  LIPASE,  BLOOD  COMPREHENSIVE METABOLIC PANEL  CBC    EKG  EKG Interpretation None       Radiology US Abdomen Limited Ruq  Result Date: 02/27/2016 CLINICAL DATA:  Right upper quadrant pain with nausea and vomiting EXAM: US ABDOMEN LIMITED - RIGHT UPPER QUADRANT COMPARISON:  CT 12/20/2004 FINDINGS: Gallbladder: No gallstones or wall thickening visualized. No sonographic Murphy sign noted by sonographer. Common bile duct: Diameter: 2.3 mm Liver: Slight increased echogenicity. No focal hepatic abnormality. No ascites. IMPRESSION: 1. No sonographic evidence for acute cholecystitis or biliary dilatation 2. Slight increased hepatic echogenicity, consistent with fatty infiltration Electronically Signed   By: Donavan Foil M.D.   On: 02/27/2016 02:50    Procedures Procedures (including critical care time)  Medications Ordered in ED Medications  sodium chloride 0.9 % bolus 1,000 mL (0 mLs Intravenous Stopped 02/27/16 0226)  ondansetron (ZOFRAN) injection 4 mg (4 mg Intravenous Given 02/27/16 0121)  morphine 4 MG/ML injection 4 mg (  4 mg Intravenous Given 02/27/16 0121)  gi cocktail (Maalox,Lidocaine,Donnatal) (30 mLs Oral Given 02/27/16 0345)     Initial Impression / Assessment and Plan / ED Course  I have reviewed the triage vital signs and the nursing notes.  Pertinent labs & imaging results that were available during my care of the patient were reviewed by me and considered in my medical decision making (see chart for details).  Clinical Course    [name] is a 46 y.o. male who presents to ED for acute flare of chronic epigastric pain. On exam, patient is afebrile, well-appearing with a nonsurgical abdomen. He does have tenderness to the epigastrium. No emesis while in ED today. Labs reviewed and reassuring. Urine without signs of infection. Ultrasound shows no signs of acute cholecystitis or biliary dilation. He does have signs of fatty infiltration of the liver. LFT's wdl. Patient  reevaluated following fluids, pain control, nausea control and feels much improved. Repeat abdominal exam with no peritoneal signs. Evaluation does not show pathology that would require ongoing emergent intervention or inpatient treatment. Patient feels comfortable with discharge to home. PCP or GI follow up encouraged. GI information given. Return precautions discussed and all questions answered.    Final Clinical Impressions(s) / ED Diagnoses   Final diagnoses:  Epigastric pain    New Prescriptions Discharge Medication List as of 02/27/2016  3:27 AM     I personally performed the services described in this documentation, which was scribed in my presence. The recorded information has been reviewed and is accurate.     [facility], PA-C 02/27/16 [id number]    Elnora Morrison, MD 02/27/16 [phone]

## 2016-02-27 NOTE — ED Notes (Signed)
No respiratory or acute distress noted alert and oriented x 3 call light in reach no reaction to medication noted. 

## 2016-02-27 NOTE — Discharge Instructions (Signed)
Please follow-up with your primary care provider or the GI clinic listed. Avoid alcohol, spicy foods, medications such as ibuprofen or Aleve as this can worsen your pain. Please seek immediate care if you develop any of the following symptoms: The pain does not go away.  You have a fever.  You keep throw up.  You pass bloody or black tarry stools.  There is bright red blood in the stool.  There is rectal pain.  You do not seem to be getting better.  You have any questions or concerns.

## 2016-05-01 ENCOUNTER — Encounter (HOSPITAL_COMMUNITY): Payer: Self-pay | Admitting: Emergency Medicine

## 2016-05-01 ENCOUNTER — Emergency Department (HOSPITAL_COMMUNITY)
Admission: EM | Admit: 2016-05-01 | Discharge: 2016-05-01 | Disposition: A | Discharge status: Home / Self Care | Payer: Medicare Other | Attending: Emergency Medicine | Admitting: Emergency Medicine

## 2016-05-01 ENCOUNTER — Emergency Department (HOSPITAL_COMMUNITY): Payer: Medicare Other

## 2016-05-01 DIAGNOSIS — S39012A Strain of muscle, fascia and tendon of lower back, initial encounter: Secondary | ICD-10-CM | POA: Diagnosis not present

## 2016-05-01 DIAGNOSIS — Y999 Unspecified external cause status: Secondary | ICD-10-CM | POA: Insufficient documentation

## 2016-05-01 DIAGNOSIS — S0003XA Contusion of scalp, initial encounter: Secondary | ICD-10-CM | POA: Diagnosis not present

## 2016-05-01 DIAGNOSIS — F17210 Nicotine dependence, cigarettes, uncomplicated: Secondary | ICD-10-CM | POA: Insufficient documentation

## 2016-05-01 DIAGNOSIS — W208XXA Other cause of strike by thrown, projected or falling object, initial encounter: Secondary | ICD-10-CM | POA: Insufficient documentation

## 2016-05-01 DIAGNOSIS — J45909 Unspecified asthma, uncomplicated: Secondary | ICD-10-CM | POA: Insufficient documentation

## 2016-05-01 DIAGNOSIS — Y9389 Activity, other specified: Secondary | ICD-10-CM | POA: Diagnosis not present

## 2016-05-01 DIAGNOSIS — S0990XA Unspecified injury of head, initial encounter: Secondary | ICD-10-CM | POA: Diagnosis present

## 2016-05-01 DIAGNOSIS — Y9289 Other specified places as the place of occurrence of the external cause: Secondary | ICD-10-CM | POA: Insufficient documentation

## 2016-05-01 MED ORDER — TRAMADOL HCL 50 MG PO TABS: 50.0000 mg | ORAL_TABLET | Freq: Four times a day (QID) | ORAL | 0 refills | Status: DC | PRN

## 2016-05-01 MED ORDER — METHOCARBAMOL 500 MG PO TABS: 500.0000 mg | ORAL_TABLET | Freq: Four times a day (QID) | ORAL | 0 refills | Status: DC

## 2016-05-01 NOTE — ED Notes (Signed)
Pt returned from CT °
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
## 2016-05-01 NOTE — Discharge Instructions (Signed)
Return if any problems.
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
## 2016-05-01 NOTE — ED Notes (Signed)
Patient transported to CT 

## 2016-05-01 NOTE — ED Notes (Signed)
Pt unable to sign upon d/c due to signature pad unavailable in the hallway. Pt verbalized understanding of the d/c instructions and prescriptions. No further questions. Nad a/ox4

## 2016-06-23 ENCOUNTER — Emergency Department (HOSPITAL_COMMUNITY)
Admission: EM | Admit: 2016-06-23 | Discharge: 2016-06-23 | Disposition: A | Discharge status: Home / Self Care | Payer: Medicare Other | Attending: Emergency Medicine | Admitting: Emergency Medicine

## 2016-06-23 ENCOUNTER — Encounter (HOSPITAL_COMMUNITY): Payer: Self-pay | Admitting: Emergency Medicine

## 2016-06-23 ENCOUNTER — Emergency Department (HOSPITAL_COMMUNITY): Payer: Medicare Other

## 2016-06-23 DIAGNOSIS — Y999 Unspecified external cause status: Secondary | ICD-10-CM | POA: Insufficient documentation

## 2016-06-23 DIAGNOSIS — S4992XA Unspecified injury of left shoulder and upper arm, initial encounter: Secondary | ICD-10-CM | POA: Diagnosis not present

## 2016-06-23 DIAGNOSIS — W230XXA Caught, crushed, jammed, or pinched between moving objects, initial encounter: Secondary | ICD-10-CM | POA: Diagnosis not present

## 2016-06-23 DIAGNOSIS — Z79899 Other long term (current) drug therapy: Secondary | ICD-10-CM | POA: Diagnosis not present

## 2016-06-23 DIAGNOSIS — F17210 Nicotine dependence, cigarettes, uncomplicated: Secondary | ICD-10-CM | POA: Diagnosis not present

## 2016-06-23 DIAGNOSIS — Y9289 Other specified places as the place of occurrence of the external cause: Secondary | ICD-10-CM | POA: Diagnosis not present

## 2016-06-23 DIAGNOSIS — J45909 Unspecified asthma, uncomplicated: Secondary | ICD-10-CM | POA: Insufficient documentation

## 2016-06-23 DIAGNOSIS — Y9389 Activity, other specified: Secondary | ICD-10-CM | POA: Diagnosis not present

## 2016-06-23 MED ORDER — OXYCODONE-ACETAMINOPHEN 5-325 MG PO TABS
1.0000 | ORAL_TABLET | Freq: Once | ORAL | Status: AC
  Administered 2016-06-23: 1 via ORAL
  Filled 2016-06-23: qty 1

## 2016-06-23 NOTE — Discharge Instructions (Signed)
Take Tylenol as needed for pain. Wears sling for support. Schedule follow-up appointment with orthopedics as listed below for further evaluation. Return to ED for worsening pain, additional injury, numbness, temperature change of skin.
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
## 2016-06-23 NOTE — ED Notes (Signed)
Pt verbalized understanding discharge instructions and denies any further needs or questions at this time. VS stable, ambulatory and steady gait.   
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
## 2016-07-05 ENCOUNTER — Emergency Department (HOSPITAL_COMMUNITY)
Admission: EM | Admit: 2016-07-05 | Discharge: 2016-07-05 | Disposition: A | Discharge status: Home / Self Care | Payer: Medicare Other

## 2016-07-05 NOTE — ED Notes (Signed)
Pt approached staff and stated "my feet are burning too much to sit here". Pt was asked to stay and be seen by doctor, but pt stated he wouldn't sit here and left the ED.

## 2016-09-25 ENCOUNTER — Emergency Department (HOSPITAL_COMMUNITY)
Admission: EM | Admit: 2016-09-25 | Discharge: 2016-09-26 | Disposition: A | Discharge status: Home / Self Care | Payer: No Typology Code available for payment source | Attending: Emergency Medicine | Admitting: Emergency Medicine

## 2016-09-25 ENCOUNTER — Emergency Department (HOSPITAL_COMMUNITY): Payer: No Typology Code available for payment source

## 2016-09-25 ENCOUNTER — Encounter (HOSPITAL_COMMUNITY): Payer: Self-pay | Admitting: *Deleted

## 2016-09-25 DIAGNOSIS — Z885 Allergy status to narcotic agent status: Secondary | ICD-10-CM | POA: Diagnosis not present

## 2016-09-25 DIAGNOSIS — Y939 Activity, unspecified: Secondary | ICD-10-CM | POA: Diagnosis not present

## 2016-09-25 DIAGNOSIS — Z79899 Other long term (current) drug therapy: Secondary | ICD-10-CM | POA: Diagnosis not present

## 2016-09-25 DIAGNOSIS — F17210 Nicotine dependence, cigarettes, uncomplicated: Secondary | ICD-10-CM | POA: Insufficient documentation

## 2016-09-25 DIAGNOSIS — Y999 Unspecified external cause status: Secondary | ICD-10-CM | POA: Insufficient documentation

## 2016-09-25 DIAGNOSIS — M436 Torticollis: Secondary | ICD-10-CM | POA: Insufficient documentation

## 2016-09-25 DIAGNOSIS — R42 Dizziness and giddiness: Secondary | ICD-10-CM | POA: Insufficient documentation

## 2016-09-25 DIAGNOSIS — Y929 Unspecified place or not applicable: Secondary | ICD-10-CM | POA: Insufficient documentation

## 2016-09-25 DIAGNOSIS — J45909 Unspecified asthma, uncomplicated: Secondary | ICD-10-CM | POA: Diagnosis not present

## 2016-09-25 DIAGNOSIS — M545 Low back pain: Secondary | ICD-10-CM | POA: Diagnosis not present
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
## 2016-09-25 NOTE — ED Notes (Signed)
Attempted to call pt x 2, no response
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
## 2016-09-25 NOTE — ED Notes (Signed)
Pt transported to CT. Pt to be transported to XR after CT scan, transport verbalized understanding.

## 2016-09-26 MED ORDER — METHOCARBAMOL 500 MG PO TABS: 500.0000 mg | ORAL_TABLET | Freq: Every evening | ORAL | 0 refills | Status: DC | PRN

## 2016-09-26 NOTE — ED Notes (Signed)
Completed visual acuity w/ pt.  Left eye - 20/40, Right eye 20/40, both eyes 20/40
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
## 2017-04-20 ENCOUNTER — Encounter (HOSPITAL_COMMUNITY): Payer: Self-pay | Admitting: Emergency Medicine

## 2017-04-20 ENCOUNTER — Emergency Department (HOSPITAL_COMMUNITY)
Admission: EM | Admit: 2017-04-20 | Discharge: 2017-04-20 | Disposition: A | Discharge status: Home / Self Care | Payer: Medicare Other | Attending: Emergency Medicine | Admitting: Emergency Medicine

## 2017-04-20 DIAGNOSIS — Z79899 Other long term (current) drug therapy: Secondary | ICD-10-CM | POA: Insufficient documentation

## 2017-04-20 DIAGNOSIS — F17210 Nicotine dependence, cigarettes, uncomplicated: Secondary | ICD-10-CM | POA: Insufficient documentation

## 2017-04-20 DIAGNOSIS — R1013 Epigastric pain: Secondary | ICD-10-CM | POA: Diagnosis not present

## 2017-04-20 DIAGNOSIS — R112 Nausea with vomiting, unspecified: Secondary | ICD-10-CM | POA: Insufficient documentation

## 2017-04-20 LAB — COMPREHENSIVE METABOLIC PANEL
ALT: 16 U/L — ABNORMAL LOW (ref 17–63)
AST: 22 U/L (ref 15–41)
Albumin: 4.1 g/dL (ref 3.5–5.0)
Alkaline Phosphatase: 50 U/L (ref 38–126)
Anion gap: 4 — ABNORMAL LOW (ref 5–15)
BUN: 9 mg/dL (ref 6–20)
CO2: 31 mmol/L (ref 22–32)
Calcium: 9.1 mg/dL (ref 8.9–10.3)
Chloride: 101 mmol/L (ref 101–111)
Creatinine, Ser: 1.1 mg/dL (ref 0.61–1.24)
GFR calc Af Amer: >60 mL/min (ref >60)
GFR calc non Af Amer: >60 mL/min (ref >60)
Glucose, Bld: 102 mg/dL — ABNORMAL HIGH (ref 65–99)
Potassium: 4.1 mmol/L (ref 3.5–5.1)
Sodium: 136 mmol/L (ref 135–145)
Total Bilirubin: 0.8 mg/dL (ref 0.3–1.2)
Total Protein: 7.3 g/dL (ref 6.5–8.1)

## 2017-04-20 LAB — CBC
HCT: 41.7 % (ref 39.0–52.0)
Hemoglobin: 14.1 g/dL (ref 13.0–17.0)
MCH: 31.1 pg (ref 26.0–34.0)
MCHC: 33.8 g/dL (ref 30.0–36.0)
MCV: 91.9 fL (ref 78.0–100.0)
Platelets: 205 10*3/uL (ref 150–400)
RBC: 4.54 MIL/uL (ref 4.22–5.81)
RDW: 14.3 % (ref 11.5–15.5)
WBC: 3.4 10*3/uL — ABNORMAL LOW (ref 4.0–10.5)

## 2017-04-20 LAB — LIPASE, BLOOD: Lipase: 23 U/L (ref 11–51)

## 2017-04-20 MED ORDER — ONDANSETRON 4 MG PO TBDP
4.0000 mg | ORAL_TABLET | Freq: Once | ORAL | Status: DC | PRN
Start: 2017-04-20 — End: 2017-04-20

## 2017-04-20 MED ORDER — MORPHINE SULFATE (PF) 4 MG/ML IV SOLN
4.0000 mg | Freq: Once | INTRAVENOUS | Status: AC
Start: 2017-04-20 — End: 2017-04-20
  Administered 2017-04-20: 4 mg via INTRAVENOUS
  Filled 2017-04-20: qty 1

## 2017-04-20 MED ORDER — ONDANSETRON HCL 4 MG/2ML IJ SOLN
4.0000 mg | Freq: Once | INTRAMUSCULAR | Status: AC
  Administered 2017-04-20: 4 mg via INTRAVENOUS
  Filled 2017-04-20: qty 2

## 2017-04-20 MED ORDER — FAMOTIDINE IN NACL 20-0.9 MG/50ML-% IV SOLN
20.0000 mg | Freq: Once | INTRAVENOUS | Status: AC
  Administered 2017-04-20: 20 mg via INTRAVENOUS
  Filled 2017-04-20: qty 50

## 2017-04-20 MED ORDER — SODIUM CHLORIDE 0.9 % IV BOLUS (SEPSIS)
1000.0000 mL | Freq: Once | INTRAVENOUS | Status: AC
  Administered 2017-04-20: 1000 mL via INTRAVENOUS

## 2017-04-20 MED ORDER — ONDANSETRON HCL 4 MG PO TABS: 4.0000 mg | ORAL_TABLET | Freq: Four times a day (QID) | ORAL | 0 refills | Status: DC

## 2017-04-20 NOTE — ED Provider Notes (Signed)
[facility] DEPT Provider Note   CSN: [id number] Arrival date & time: 04/20/17  1021     History   Chief Complaint Chief Complaint  Patient presents with  . Emesis  . Diarrhea  . Abdominal Pain    HPI [name] is a 47 y.o. male.  He has a prior history of pancreatitis.  He is complaining of 3 weeks of daily vomiting associated with nausea and midepigastric abdominal pain severe radiating through to the back.  There is no blood in the vomitus.  Michela Pitcher he is more constipated than usual.  He has tried some Ensure shakes but is vomiting.  He denies recent alcohol use.  He does not her primary care doctor.  He states this feels similar to his typical pancreatitis attacks.  The history is provided by the patient.  Abdominal Pain   This is a recurrent problem. The current episode started more than 1 week ago. The problem occurs constantly. The problem has not changed since onset.The pain is associated with an unknown factor. The pain is located in the epigastric region. The quality of the pain is aching. The pain is severe. Associated symptoms include nausea, vomiting and constipation. Pertinent negatives include anorexia, fever, belching, diarrhea, flatus, hematochezia, melena, dysuria, frequency, hematuria, headaches, arthralgias and myalgias. The symptoms are aggravated by vomiting. Nothing relieves the symptoms.    Past Medical History:  Diagnosis Date  . Abdominal pain    intermittent  . Anemia   . Aneurysm ([place])   . Asthma   . GERD (gastroesophageal reflux disease)   . Pancreatitis   . PTSD (post-traumatic stress disorder)    controlled  . Rectal polyp 01/09/2006   Hyperplastic  . Seizures [facility])     Patient Active Problem List   Diagnosis Date Noted  . Hyponatremia   . Partial seizure ([place]) 03/05/2015  . Seizure ([place]) 03/05/2015  . Subdural hematoma ([place]) 02/28/2015  . Abdominal pain, chronic, epigastric 07/02/2014  . ABDOMINAL  PAIN, GENERALIZED 03/07/2010  . KNEE PAIN, RIGHT 11/18/2009  . HYPERGLYCEMIA 09/08/2009  . ANKLE SPRAIN, LEFT 02/05/2009  . BACK STRAIN, LUMBAR 02/05/2009  . POLYURIA 10/28/2008  . ABDOMINAL PAIN, EPIGASTRIC 10/28/2008  . INSOMNIA, CHRONIC 10/10/2007  . DEPRESSION, CHRONIC 10/10/2007  . ASTHMA 10/10/2007  . WOUND INFECTION 10/10/2007  . ALCOHOLISM 04/15/2007  . 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 PANCREATITIS, CHRONIC 04/15/2007    Past Surgical History:  Procedure Laterality Date  . arm surgery Left    repair of tendons "work injury cut"-"pinky finger numb"  . BURR HOLE Right 02/28/2015   Procedure: BURR HOLES FOR SUBDURAL HEMATOMA;  Surgeon: Newman Pies, MD;  Location: [facility];  Service: Neurosurgery;  Laterality: Right;  . CEREBRAL ANEURYSM REPAIR    . ESOPHAGOGASTRODUODENOSCOPY (EGD) WITH PROPOFOL N/A 01/13/2015   Procedure: ESOPHAGOGASTRODUODENOSCOPY (EGD) WITH PROPOFOL;  Surgeon: [name], MD;  Location: [facility];  Service: Endoscopy;  Laterality: N/A;  . ESOPHAGOGASTRODUODENOSCOPY ENDOSCOPY    . HERNIA REPAIR    . laceration of left arm    . 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 Authorizing Provider  dicyclomine (BENTYL) 20 MG tablet Take 1 tablet (20 mg total) by mouth 2 (two) times daily. 09/17/14   Domenic Moras, PA-C  folic acid (FOLVITE) 1 MG tablet Take 1 mg by mouth daily.  [provider]  levETIRAcetam (KEPPRA) 500 MG tablet Take 1 tablet (500 mg total) by mouth 2 (two) times daily. 03/07/15   Liberty Handy, MD  lipase/protease/amylase (CREON) 36000 UNITS CPEP capsule Take 72,000-108,000 Units by mouth 5 (five) times daily. Pt takes three capsules three times daily with meals and two capsules two times daily with snacks.    [provider]  methocarbamol (ROBAXIN) 500 MG tablet Take 1 tablet (500 mg total) by mouth at  bedtime as needed for muscle spasms. 09/26/16   Emeline General, PA-C  omeprazole (PRILOSEC) 40 MG capsule Take 1 capsule (40 mg total) by mouth daily. 01/13/15   [name], MD  thiamine (VITAMIN B-1) 100 MG tablet Take 100 mg by mouth daily.    [provider]  traMADol (ULTRAM) 50 MG tablet Take 1 tablet (50 mg total) by mouth every 6 (six) hours as needed. 05/01/16   Fransico Meadow, PA-C    Family History Family History  Problem Relation Age of Onset  . Stroke Mother   . Hypertension Mother   . Pancreatic cancer Maternal Uncle   . Cancer Other   . Diabetes Other     Social History Social History   Tobacco Use  . Smoking status: Current Some Day Smoker    Packs/day: 0.00    Types: Cigarettes  . Smokeless tobacco: Never Used  Substance Use Topics  . Alcohol use: No  . Drug use: Yes    Types: Marijuana    Comment: Quit-none in 1 month     Allergies   Aspirin; Ibuprofen; and Hydrocodone   Review of Systems Review of Systems  Constitutional: Negative for chills and fever.  HENT: Negative for ear pain and sore throat.   Eyes: Negative for pain and visual disturbance.  Respiratory: Negative for cough and shortness of breath.   Cardiovascular: Negative for chest pain and palpitations.  Gastrointestinal: Positive for abdominal pain, constipation, nausea and vomiting. Negative for anorexia, diarrhea, flatus, hematochezia and melena.  Genitourinary: Negative for dysuria, frequency and hematuria.  Musculoskeletal: Negative for arthralgias, back pain and myalgias.  Skin: Negative for color change and rash.  Neurological: Negative for seizures, syncope and headaches.  All other systems reviewed and are negative.    Physical Exam Updated Vital Signs BP (!) 133/97 (BP Location: Right Arm)   Pulse 84   Temp 97.7 F (36.5 C) (Oral)   Resp 19   SpO2 100%   Physical Exam  Constitutional: He appears well-developed and well-nourished.  HENT:  Head:  Normocephalic and atraumatic.  Eyes: Conjunctivae are normal.  Neck: Neck supple.  Cardiovascular: Normal rate and regular rhythm.  No murmur heard. Pulmonary/Chest: Effort normal and breath sounds normal. No respiratory distress.  Abdominal: Soft. There is generalized tenderness and tenderness in the epigastric area. There is no rigidity and no guarding.  Musculoskeletal: He exhibits no edema, tenderness or deformity.  Neurological: He is alert.  Skin: Skin is warm and dry. Capillary refill takes less than 2 seconds.  Psychiatric: He has a normal mood and affect.  Nursing note and vitals reviewed.    ED Treatments / Results  Labs (all labs ordered are listed, but only abnormal results are displayed) Labs Reviewed  COMPREHENSIVE METABOLIC PANEL - Abnormal; Notable for the following components:      Result Value   Glucose, Bld 102 (*)    ALT 16 (*)    Anion gap 4 (*)    All other components within normal  limits  CBC - Abnormal; Notable for the following components:   WBC 3.4 (*)    All other components within normal limits  LIPASE, BLOOD    EKG  EKG Interpretation None       Radiology No results found.  Procedures Procedures (including critical care time)  Medications Ordered in ED Medications  morphine 4 MG/ML injection 4 mg (not administered)  sodium chloride 0.9 % bolus 1,000 mL (not administered)  ondansetron (ZOFRAN) injection 4 mg (not administered)  famotidine (PEPCID) IVPB 20 mg premix (not administered)     Initial Impression / Assessment and Plan / ED Course  I have reviewed the triage vital signs and the nursing notes.  Pertinent labs & imaging results that were available during my care of the patient were reviewed by me and considered in my medical decision making (see chart for details).  Clinical Course as of Apr 21 1931  Sat Apr 20, 2017  1257 Reevaluated patient and reviewed his labs with him.  There is no significant findings on the lab work  with no evidence of pancreatitis is slightly depressed white count.  He is feeling better and he wants to p.o. trial to get him some fluids.  [MB]  5885 Patient was able to p.o. trial with improvement in his symptoms.  [MB]    Clinical Course User Index [MB] Hayden Rasmussen, MD     Final Clinical Impressions(s) / ED Diagnoses   Final diagnoses:  Non-intractable vomiting with nausea, unspecified vomiting type  Epigastric pain    ED Discharge Orders        Ordered    ondansetron (ZOFRAN) 4 MG tablet  Every 6 hours     04/20/17 1336       Hayden Rasmussen, MD 04/20/17 1933

## 2017-04-20 NOTE — ED Notes (Signed)
Pt provided with Sprite for PO challenge

## 2017-04-20 NOTE — Discharge Instructions (Signed)
You were evaluated in the emergency department for multiple weeks of nausea and vomiting and epigastric abdominal pain.  Your lab work did not show any obvious causes for the symptoms but you improved with fluids and pain medication.  We are prescribing you some nausea medicine to go home with and you will need to follow-up with the primary care clinic.  Please return to the emergency department if any worsening symptoms.

## 2017-04-20 NOTE — ED Triage Notes (Signed)
Patient c/o n/v/d that has been every day for over 2 weeks.

## 2017-06-28 ENCOUNTER — Emergency Department (HOSPITAL_COMMUNITY)
Admission: EM | Admit: 2017-06-28 | Discharge: 2017-06-28 | Discharge status: Against Medical Advice | Payer: Medicare Other | Attending: Emergency Medicine | Admitting: Emergency Medicine

## 2017-06-28 ENCOUNTER — Encounter (HOSPITAL_COMMUNITY): Payer: Self-pay

## 2017-06-28 DIAGNOSIS — Y998 Other external cause status: Secondary | ICD-10-CM | POA: Insufficient documentation

## 2017-06-28 DIAGNOSIS — S0990XA Unspecified injury of head, initial encounter: Secondary | ICD-10-CM | POA: Insufficient documentation

## 2017-06-28 DIAGNOSIS — Z5321 Procedure and treatment not carried out due to patient leaving prior to being seen by health care provider: Secondary | ICD-10-CM | POA: Diagnosis not present

## 2017-06-28 DIAGNOSIS — Y939 Activity, unspecified: Secondary | ICD-10-CM | POA: Insufficient documentation

## 2017-06-28 DIAGNOSIS — Y929 Unspecified place or not applicable: Secondary | ICD-10-CM | POA: Insufficient documentation

## 2017-06-28 DIAGNOSIS — W228XXA Striking against or struck by other objects, initial encounter: Secondary | ICD-10-CM | POA: Diagnosis not present

## 2017-06-28 NOTE — ED Notes (Signed)
Pt stated that he needs to pick his son up from school in [place]; stated there was absolutely no one else able to pick him up. States he will come back once his son is at his grandmother's.

## 2017-06-28 NOTE — ED Notes (Signed)
No response for ready room x3.

## 2017-06-28 NOTE — ED Triage Notes (Signed)
Pt states he was at home, turned around too fast and hit right side of head on door frame. Denies taking blood thinners or LOC. A&Ox 4

## 2017-06-29 ENCOUNTER — Encounter (HOSPITAL_COMMUNITY): Payer: Self-pay | Admitting: Emergency Medicine

## 2017-06-29 ENCOUNTER — Emergency Department (HOSPITAL_COMMUNITY)
Admission: EM | Admit: 2017-06-29 | Discharge: 2017-06-29 | Disposition: A | Discharge status: Home / Self Care | Payer: Medicare Other | Attending: Emergency Medicine | Admitting: Emergency Medicine

## 2017-06-29 DIAGNOSIS — Z5321 Procedure and treatment not carried out due to patient leaving prior to being seen by health care provider: Secondary | ICD-10-CM | POA: Insufficient documentation

## 2017-06-29 DIAGNOSIS — Y998 Other external cause status: Secondary | ICD-10-CM | POA: Diagnosis not present

## 2017-06-29 DIAGNOSIS — Y929 Unspecified place or not applicable: Secondary | ICD-10-CM | POA: Insufficient documentation

## 2017-06-29 DIAGNOSIS — S0990XA Unspecified injury of head, initial encounter: Secondary | ICD-10-CM | POA: Insufficient documentation

## 2017-06-29 DIAGNOSIS — W01198A Fall on same level from slipping, tripping and stumbling with subsequent striking against other object, initial encounter: Secondary | ICD-10-CM | POA: Diagnosis not present

## 2017-06-29 DIAGNOSIS — Y939 Activity, unspecified: Secondary | ICD-10-CM | POA: Insufficient documentation

## 2017-06-29 NOTE — ED Triage Notes (Signed)
Reports slipping on a rug this evening hitting the right side of the head on a door frame.  Came earlier today but didn't wait to be seen.  Not on any blood thinners.  History of brain surgery in 2017,

## 2017-07-11 ENCOUNTER — Ambulatory Visit (HOSPITAL_COMMUNITY)
Admission: EM | Admit: 2017-07-11 | Discharge: 2017-07-11 | Disposition: A | Discharge status: Home / Self Care | Payer: Medicare Other | Attending: Physician Assistant | Admitting: Physician Assistant

## 2017-07-11 ENCOUNTER — Encounter (HOSPITAL_COMMUNITY): Payer: Self-pay | Admitting: Emergency Medicine

## 2017-07-11 DIAGNOSIS — K0889 Other specified disorders of teeth and supporting structures: Secondary | ICD-10-CM

## 2017-07-11 DIAGNOSIS — K029 Dental caries, unspecified: Secondary | ICD-10-CM

## 2017-07-11 MED ORDER — TRAMADOL HCL 50 MG PO TABS: 50.0000 mg | ORAL_TABLET | Freq: Four times a day (QID) | ORAL | 0 refills | Status: DC | PRN

## 2017-07-11 MED ORDER — CLINDAMYCIN HCL 150 MG PO CAPS: 150.0000 mg | ORAL_CAPSULE | Freq: Four times a day (QID) | ORAL | 0 refills | Status: DC

## 2017-07-11 NOTE — Discharge Instructions (Signed)
Go ahead and start the clindamycin today.  Try to get back into the dentist as quickly as possible so they can help you further.  Okay to take the tramadol as prescribed.  A warm compress to the affected area would probably be most helpful.

## 2017-07-11 NOTE — ED Triage Notes (Signed)
Pt c/o dental pain, states "I think my wisdom teeth are growing in".

## 2017-07-11 NOTE — ED Provider Notes (Addendum)
07/11/2017 3:18 PM   DOB: [birth year] / MRN: [id number]  SUBJECTIVE:  [name] is a 47 y.o. male presenting for left sided lower jaw pain that started about 3 days ago and is worsening.  Describes this as a throb.  Tells me has a wisdom tooth that can give him trouble from time to time. He is a smoker.   He is allergic to aspirin; ibuprofen; and hydrocodone.   He  has a past medical history of Abdominal pain, Anemia, Aneurysm ([place]), Asthma, GERD (gastroesophageal reflux disease), Pancreatitis, PTSD (post-traumatic stress disorder), Rectal polyp (01/09/2006), and Seizures ([place]).    He  reports that he has been smoking cigarettes.  He has been smoking about 0.00 packs per day. He has never used smokeless tobacco. He reports that he has current or past drug history. Drug: Marijuana. He reports that he does not drink alcohol. He  has no sexual activity history on file. 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  His family history includes Cancer in his other; Diabetes in his other; Hypertension in his mother; Pancreatic cancer in his maternal uncle; Stroke in his mother.  Review of Systems  Constitutional: Negative for chills and fever.  HENT: Negative for sore throat.   Respiratory: Negative for cough.   Musculoskeletal: Negative for myalgias.  Skin: Negative for itching and rash.  Neurological: Negative for dizziness.    OBJECTIVE:  BP 114/69 (BP Location: Right Arm)   Pulse 81   Temp 98.7 F (37.1 C)   Resp 16   SpO2 97%   Wt Readings from Last 3 Encounters:  06/23/16 180 lb (81.6 kg)  02/26/16 185 lb 12.8 oz (84.3 kg)  02/21/16 180 lb (81.6 kg)   Temp Readings from Last 3 Encounters:   07/11/17 98.7 F (37.1 C)  06/29/17 97.9 F (36.6 C) (Oral)  06/28/17 97.9 F (36.6 C) (Oral)   BP Readings from Last 3 Encounters:  07/11/17 114/69  06/29/17 (!) 131/92  06/28/17 129/78   Pulse Readings from Last 3 Encounters:  07/11/17 81  06/29/17 60  06/28/17 98   Lab Results  Component Value Date   CREATININE 1.10 04/20/2017     Physical Exam  Constitutional: He is oriented to person, place, and time. He appears well-developed. He does not appear ill.  HENT:  Mouth/Throat:    Eyes: Pupils are equal, round, and reactive to light. Conjunctivae and EOM are normal.  Cardiovascular: Normal rate, regular rhythm, normal heart sounds and intact distal pulses. Exam reveals no gallop and no friction rub.  No murmur heard. Pulmonary/Chest: Effort normal. No stridor. No respiratory distress. He has no wheezes. He has no rales. He exhibits no tenderness.  Abdominal: He exhibits no distension.  Musculoskeletal: Normal range of motion.  Neurological: He is alert and oriented to person, place, and time. No cranial nerve deficit. Coordination normal.  Skin: Skin is warm and dry. He is not diaphoretic.  Psychiatric: He has a normal mood and affect.  Nursing note and vitals reviewed.   No results found for this or any previous visit (from the past 72 hour(s)).  No results found.  ASSESSMENT AND PLAN:   Toothache  Dental caries    Discharge Instructions     Go ahead and start the clindamycin today.  Try  to get back into the dentist as quickly as possible so they can help you further.  Okay to take the tramadol as prescribed.  A warm compress to the affected area would probably be most helpful.        The patient is advised to call or return to clinic if he does not see an improvement in symptoms, or to seek the care of the closest emergency department if he worsens with the above plan.   Philis Fendt, MHS, PA-C 07/11/2017 3:18 PM   Tereasa Coop,  PA-C 07/11/17 1518    Tereasa Coop, PA-C 07/11/17 [phone]

## 2017-11-26 ENCOUNTER — Encounter: Payer: Self-pay | Admitting: Internal Medicine

## 2017-11-26 ENCOUNTER — Ambulatory Visit (INDEPENDENT_AMBULATORY_CARE_PROVIDER_SITE_OTHER): Payer: Medicare HMO | Admitting: Internal Medicine

## 2017-11-26 VITALS — BP 128/78 | HR 81 | Temp 97.8°F | Ht 74.0 in | Wt 170.6 lb

## 2017-11-26 DIAGNOSIS — K582 Mixed irritable bowel syndrome: Secondary | ICD-10-CM | POA: Diagnosis not present

## 2017-11-26 DIAGNOSIS — M549 Dorsalgia, unspecified: Secondary | ICD-10-CM

## 2017-11-26 DIAGNOSIS — R7309 Other abnormal glucose: Secondary | ICD-10-CM

## 2017-11-26 DIAGNOSIS — K219 Gastro-esophageal reflux disease without esophagitis: Secondary | ICD-10-CM | POA: Diagnosis not present

## 2017-11-26 DIAGNOSIS — Z8669 Personal history of other diseases of the nervous system and sense organs: Secondary | ICD-10-CM | POA: Diagnosis not present

## 2017-11-26 DIAGNOSIS — S335XXA Sprain of ligaments of lumbar spine, initial encounter: Secondary | ICD-10-CM

## 2017-11-26 DIAGNOSIS — Z87898 Personal history of other specified conditions: Secondary | ICD-10-CM | POA: Diagnosis not present

## 2017-11-26 DIAGNOSIS — Z8719 Personal history of other diseases of the digestive system: Secondary | ICD-10-CM

## 2017-11-26 DIAGNOSIS — J3089 Other allergic rhinitis: Secondary | ICD-10-CM | POA: Diagnosis not present

## 2017-11-26 DIAGNOSIS — R112 Nausea with vomiting, unspecified: Secondary | ICD-10-CM | POA: Diagnosis not present

## 2017-11-26 MED ORDER — FOLIC ACID 1 MG PO TABS: 1.0000 mg | ORAL_TABLET | Freq: Every day | ORAL | 1 refills | Status: DC

## 2017-11-26 MED ORDER — DICYCLOMINE HCL 20 MG PO TABS: 20.0000 mg | ORAL_TABLET | Freq: Two times a day (BID) | ORAL | 0 refills | Status: DC

## 2017-11-26 MED ORDER — LORATADINE 10 MG PO TABS: 10.0000 mg | ORAL_TABLET | Freq: Every day | ORAL | 1 refills | Status: DC

## 2017-11-26 MED ORDER — OMEPRAZOLE 40 MG PO CPDR: 40.0000 mg | DELAYED_RELEASE_CAPSULE | Freq: Every day | ORAL | 1 refills | Status: DC

## 2017-11-26 MED ORDER — DICYCLOMINE HCL 20 MG PO TABS: 20.0000 mg | ORAL_TABLET | Freq: Three times a day (TID) | ORAL | 1 refills | Status: DC

## 2017-11-26 MED ORDER — VITAMIN B-1 100 MG PO TABS: 100.0000 mg | ORAL_TABLET | Freq: Every day | ORAL | 1 refills | Status: DC

## 2017-11-26 NOTE — Progress Notes (Signed)
Subjective:     Patient ID: [name] , male    DOB: [birth year] , 47 y.o.   MRN: [id number]   HPI  Pt moved to town and his PCP was in [place].  Has not seen a family MD x 1-2 years.  Sees Dr Christella Hartigan for GI for chronic pancreatitis and last time he saw him was 2017, and the first time was around 2014 and had only drank 2 alcoholic drinks, and neurosurgeon for past cerebral aneurysm 2017.  Has been out of his Kepra x 6 nmonths but has not had any seizues  Past Medical History:  Diagnosis Date  . Abdominal pain    intermittent  . Anemia   . Aneurysm ([facility])   . Asthma   . GERD (gastroesophageal reflux disease)   . Pancreatitis   . PTSD (post-traumatic stress disorder)    controlled  . Rectal polyp 01/09/2006   Hyperplastic  . Seizures ([facility])     Allergies  Allergen Reactions  . Aspirin Anaphylaxis  . Ibuprofen Anaphylaxis  . Hydrocodone Hives and Itching     Current Outpatient Medications:  .  clindamycin (CLEOCIN) 150 MG capsule, Take 1 capsule (150 mg total) by mouth every 6 (six) hours., Disp: 28 capsule, Rfl: 0 .  dicyclomine (BENTYL) 20 MG tablet, Take 1 tablet (20 mg total) by mouth 2 (two) times daily., Disp: 20 tablet, Rfl: 0 .  folic acid (FOLVITE) 1 MG tablet, Take 1 mg by mouth daily., Disp: , Rfl: 4 .  levETIRAcetam (KEPPRA) 500 MG tablet, Take 1 tablet (500 mg total) by mouth 2 (two) times daily., Disp: 60 tablet, Rfl: 3 .  lipase/protease/amylase (CREON) 36000 UNITS CPEP capsule, Take 72,000-108,000 Units by mouth 5 (five) times daily. Pt takes three capsules three times daily with meals and two capsules two times daily with snacks., Disp: , Rfl:  .  methocarbamol (ROBAXIN) 500 MG tablet, Take 1 tablet (500 mg total) by mouth at bedtime as needed for muscle spasms., Disp: 12 tablet, Rfl: 0 .  Multiple Vitamin (MULTIVITAMIN WITH MINERALS) TABS tablet, Take 1 tablet by mouth daily., Disp: , Rfl:  .  omeprazole (PRILOSEC) 40 MG capsule, Take 1 capsule (40 mg  total) by mouth daily., Disp: 30 capsule, Rfl: 11 .  ondansetron (ZOFRAN) 4 MG tablet, Take 1 tablet (4 mg total) by mouth every 6 (six) hours., Disp: 12 tablet, Rfl: 0 .  tetrahydrozoline-zinc (VISINE-AC) 0.05-0.25 % ophthalmic solution, Place 2 drops into both eyes 3 (three) times daily as needed (Itching)., Disp: , Rfl:  .  thiamine (VITAMIN B-1) 100 MG tablet, Take 100 mg by mouth daily., Disp: , Rfl:  .  traMADol (ULTRAM) 50 MG tablet, Take 1 tablet (50 mg total) by mouth every 6 (six) hours as needed., Disp: 15 tablet, Rfl: 0   Allergies  Allergen Reactions  . Aspirin Anaphylaxis  . Ibuprofen Anaphylaxis  . Hydrocodone Hives and Itching     Review of Systems  Constitutional: Positive for chills, fever and unexpected weight change.       Lost 10 lbs in the past 6 months ago  HENT: Negative for rhinorrhea and sneezing.        Has post nasal drainage and sneezing, and has not taken anything, would like rx sent  Respiratory: Negative for chest tightness and shortness of breath.   Cardiovascular: Negative for chest pain, palpitations and leg swelling.  Gastrointestinal: Positive for abdominal pain, constipation, diarrhea and nausea. Negative for blood in stool.  Chronic and happens q am, even on the prilosec it has not helped. Gets intermittent constipation and diarrhea  Musculoskeletal: Positive for back pain.       Has chronic back pain  Neurological: Negative for dizziness, seizures and headaches.     Today's Vitals   11/26/17 1110  BP: 128/78  Pulse: 81  Temp: 97.8 F (36.6 C)  TempSrc: Oral  SpO2: 98%  Weight: 170 lb 9.6 oz (77.4 kg)  Height: 6\' 2"  (1.88 m)  PainSc: 10-Worst pain ever  PainLoc: Abdomen   Body mass index is 21.9 kg/m.   Objective:  Physical Exam  Constitutional:  Thin, who seems in pain, and hold his abdomen off and on  HENT:  Head: Normocephalic.  Right Ear: External ear normal.  Left Ear: External ear normal.  Nose: Nose normal.   Mouth/Throat: Oropharyngeal exudate present.  Eyes: Conjunctivae are normal. No scleral icterus.  Neck: Neck supple. No thyromegaly present.  Abdominal: Soft. Bowel sounds are normal. He exhibits no distension and no mass. There is tenderness. There is no rebound and no guarding. No hernia.  Has moderate tenderness on lower abdomen and around periumbilical area. Umbilical hernia is well healed and I dont palpate any new hernia.   Lymphadenopathy:    He has no cervical adenopathy.  Skin: He is not diaphoretic.  Vitals reviewed.       Assessment And Plan:    1- GERD- I refilled the priolosec and taught to take it in am on empty stomach and to avoid and eating before bed time. 2- Chronic back pain- no action 3- Hx of pancreatitis with chronic N/V- amylase, lipase, CBC and CMP ordered. Sent back to his GI 4- IBS- with recurrent flairs- I refilled his dicyclomine 5-Hs of hyperglycemia- HGBA1C ordered. 6- allergic rhinitis- I sent Claritin 10 mg qd 7- History of seizures- I sent him to neurology, since he has been off the Kepra for 6 months and has not had any seizures, he may not need to be back on this.  We will inform him of his labs when they are back. FU for physical.     Iretha Kirley [place], PA-C

## 2017-11-26 NOTE — Assessment & Plan Note (Signed)
Told he has L4-L5 bulging disc diagnosed by spine xray from the ER

## 2017-11-27 LAB — CBC WITH DIFFERENTIAL/PLATELET
Basophils Absolute: 0 10*3/uL (ref 0.0–0.2)
Basos: 0 %
EOS (ABSOLUTE): 0 10*3/uL (ref 0.0–0.4)
Eos: 0 %
Hematocrit: 44.3 % (ref 37.5–51.0)
Hemoglobin: 14.7 g/dL (ref 13.0–17.7)
Immature Grans (Abs): 0 10*3/uL (ref 0.0–0.1)
Immature Granulocytes: 0 %
Lymphocytes Absolute: 1.6 10*3/uL (ref 0.7–3.1)
Lymphs: 34 %
MCH: 29.7 pg (ref 26.6–33.0)
MCHC: 33.2 g/dL (ref 31.5–35.7)
MCV: 90 fL (ref 79–97)
Monocytes Absolute: 0.3 10*3/uL (ref 0.1–0.9)
Monocytes: 7 %
Neutrophils Absolute: 2.8 10*3/uL (ref 1.4–7.0)
Neutrophils: 59 %
Platelets: 180 10*3/uL (ref 150–450)
RBC: 4.95 x10E6/uL (ref 4.14–5.80)
RDW: 13.3 % (ref 12.3–15.4)
WBC: 4.7 10*3/uL (ref 3.4–10.8)

## 2017-11-27 LAB — CMP14 + ANION GAP
ALT: 18 IU/L (ref 0–44)
AST: 23 IU/L (ref 0–40)
Albumin/Globulin Ratio: 1.4 (ref 1.2–2.2)
Albumin: 4.5 g/dL (ref 3.5–5.5)
Alkaline Phosphatase: 55 IU/L (ref 39–117)
Anion Gap: 17 mmol/L (ref 10.0–18.0)
BUN/Creatinine Ratio: 10 (ref 9–20)
BUN: 9 mg/dL (ref 6–24)
Bilirubin Total: 0.3 mg/dL (ref 0.0–1.2)
CO2: 23 mmol/L (ref 20–29)
Calcium: 9.5 mg/dL (ref 8.7–10.2)
Chloride: 98 mmol/L (ref 96–106)
Creatinine, Ser: 0.94 mg/dL (ref 0.76–1.27)
GFR calc Af Amer: 110 mL/min/{1.73_m2} (ref >59)
GFR calc non Af Amer: 95 mL/min/{1.73_m2} (ref >59)
Globulin, Total: 3.2 g/dL (ref 1.5–4.5)
Glucose: 87 mg/dL (ref 65–99)
Potassium: 4.7 mmol/L (ref 3.5–5.2)
Sodium: 138 mmol/L (ref 134–144)
Total Protein: 7.7 g/dL (ref 6.0–8.5)

## 2017-11-27 LAB — AMYLASE: Amylase: 114 U/L (ref 31–124)

## 2017-11-27 LAB — HEMOGLOBIN A1C
Est. average glucose Bld gHb Est-mCnc: 108 mg/dL
Hgb A1c MFr Bld: 5.4 % (ref 4.8–5.6)

## 2017-11-27 LAB — LIPASE: Lipase: 45 U/L (ref 13–78)

## 2017-12-03 ENCOUNTER — Encounter: Payer: Self-pay | Admitting: Neurology

## 2017-12-24 ENCOUNTER — Encounter: Payer: Self-pay | Admitting: Internal Medicine

## 2017-12-24 ENCOUNTER — Ambulatory Visit (INDEPENDENT_AMBULATORY_CARE_PROVIDER_SITE_OTHER): Payer: Medicare HMO | Admitting: Internal Medicine

## 2017-12-24 VITALS — BP 116/68 | HR 78 | Temp 98.1°F | Ht 74.0 in | Wt 175.8 lb

## 2017-12-24 DIAGNOSIS — Z1211 Encounter for screening for malignant neoplasm of colon: Secondary | ICD-10-CM

## 2017-12-24 DIAGNOSIS — R1030 Lower abdominal pain, unspecified: Secondary | ICD-10-CM

## 2017-12-24 DIAGNOSIS — G8929 Other chronic pain: Secondary | ICD-10-CM | POA: Diagnosis not present

## 2017-12-24 DIAGNOSIS — E539 Vitamin B deficiency, unspecified: Secondary | ICD-10-CM | POA: Diagnosis not present

## 2017-12-24 DIAGNOSIS — M549 Dorsalgia, unspecified: Secondary | ICD-10-CM | POA: Diagnosis not present

## 2017-12-24 DIAGNOSIS — K5901 Slow transit constipation: Secondary | ICD-10-CM | POA: Diagnosis not present

## 2017-12-24 DIAGNOSIS — R351 Nocturia: Secondary | ICD-10-CM

## 2017-12-24 DIAGNOSIS — E538 Deficiency of other specified B group vitamins: Secondary | ICD-10-CM

## 2017-12-24 DIAGNOSIS — Z23 Encounter for immunization: Secondary | ICD-10-CM

## 2017-12-24 DIAGNOSIS — Z125 Encounter for screening for malignant neoplasm of prostate: Secondary | ICD-10-CM | POA: Diagnosis not present

## 2017-12-24 DIAGNOSIS — R1010 Upper abdominal pain, unspecified: Secondary | ICD-10-CM

## 2017-12-24 DIAGNOSIS — Z0000 Encounter for general adult medical examination without abnormal findings: Secondary | ICD-10-CM

## 2017-12-24 LAB — POCT URINALYSIS DIPSTICK
Bilirubin, UA: NEGATIVE
Blood, UA: NEGATIVE
Glucose, UA: NEGATIVE
Ketones, UA: NEGATIVE
Leukocytes, UA: NEGATIVE
Nitrite, UA: NEGATIVE
Protein, UA: NEGATIVE
Spec Grav, UA: 1.02 (ref 1.010–1.025)
Urobilinogen, UA: 1 E.U./dL
pH, UA: 5.5 (ref 5.0–8.0)

## 2017-12-24 LAB — POC HEMOCCULT BLD/STL (OFFICE/1-CARD/DIAGNOSTIC): Fecal Occult Blood, POC: NEGATIVE

## 2017-12-24 MED ORDER — METOCLOPRAMIDE HCL 10 MG PO TABS: ORAL_TABLET | ORAL | 0 refills | Status: DC

## 2017-12-24 MED ORDER — DOCUSATE SODIUM 100 MG PO CAPS: 100.0000 mg | ORAL_CAPSULE | Freq: Two times a day (BID) | ORAL | 2 refills | Status: DC

## 2017-12-24 NOTE — Progress Notes (Addendum)
Subjective:     Patient ID: [name] , male    DOB: 1970/06/24 , 47 y.o.   MRN: [id number]   Chief Complaint  Patient presents with  . Annual Exam  . Follow-up    C/O tingling on soles of feet radiates up to calf , 2-3 weeks   . Anxiety    HPI Pt is here  For a physical.  Getting back on the Prilosec has helped a lot, though his abdominal pain has not fully resolved. He does stay constipated and has BM's about ones a week, and has to take a laxative for this.   Past Medical History:  Diagnosis Date  . Abdominal pain    intermittent  . Anemia   . Aneurysm (HCC)   . Asthma   . GERD (gastroesophageal reflux disease)   . Pancreatitis   . PTSD (post-traumatic stress disorder)    controlled  . PTSD (post-traumatic stress disorder)   . PTSD (post-traumatic stress disorder) 2008  . Rectal polyp 01/09/2006   Hyperplastic  . Seizures (HCC)      Family History  Problem Relation Age of Onset  . Stroke Mother   . Hypertension Mother   . Diabetes Father   . Pancreatic cancer Maternal Uncle   . Cancer Other   . Diabetes Other      Current Outpatient Medications:  .  dicyclomine (BENTYL) 20 MG tablet, Take 1 tablet (20 mg total) by mouth 4 (four) times daily -  before meals and at bedtime., Disp: 90 tablet, Rfl: 1 .  folic acid (FOLVITE) 1 MG tablet, Take 1 tablet (1 mg total) by mouth daily., Disp: 90 tablet, Rfl: 1 .  levETIRAcetam (KEPPRA) 500 MG tablet, Take 1 tablet (500 mg total) by mouth 2 (two) times daily., Disp: 60 tablet, Rfl: 3 .  lipase/protease/amylase (CREON) 36000 UNITS CPEP capsule, Take 72,000-108,000 Units by mouth 5 (five) times daily. Pt takes three capsules three times daily with meals and two capsules two times daily with snacks., Disp: , Rfl:  .  loratadine (CLARITIN) 10 MG tablet, Take 1 tablet (10 mg total) by mouth daily., Disp: 90 tablet, Rfl: 1 .  methocarbamol (ROBAXIN) 500 MG tablet, Take 1 tablet (500 mg total) by mouth at bedtime as needed  for muscle spasms., Disp: 12 tablet, Rfl: 0 .  Multiple Vitamin (MULTIVITAMIN WITH MINERALS) TABS tablet, Take 1 tablet by mouth daily., Disp: , Rfl:  .  omeprazole (PRILOSEC) 40 MG capsule, Take 1 capsule (40 mg total) by mouth daily., Disp: 90 capsule, Rfl: 1 .  ondansetron (ZOFRAN) 4 MG tablet, Take 1 tablet (4 mg total) by mouth every 6 (six) hours., Disp: 12 tablet, Rfl: 0 .  tetrahydrozoline-zinc (VISINE-AC) 0.05-0.25 % ophthalmic solution, Place 2 drops into both eyes 3 (three) times daily as needed (Itching)., Disp: , Rfl:  .  thiamine (VITAMIN B-1) 100 MG tablet, Take 1 tablet (100 mg total) by mouth daily., Disp: 90 tablet, Rfl: 1   Allergies  Allergen Reactions  . Aspirin Anaphylaxis  . Ibuprofen Anaphylaxis  . Hydrocodone Hives and Itching     Review of Systems  Constitutional: Positive for chills. Negative for appetite change, diaphoresis, fever and unexpected weight change.       Chills like x 1 week  HENT: Negative.   Eyes: Positive for visual disturbance. Negative for photophobia, pain, discharge, redness and itching.       His vision is getting bad to see up close and far away.  Is having to move what he reads further away from him.   Respiratory: Negative for cough, shortness of breath and wheezing.   Cardiovascular: Negative for chest pain, palpitations and leg swelling.  Gastrointestinal: Positive for abdominal distention, abdominal pain, constipation and nausea. Negative for anal bleeding, blood in stool, diarrhea, rectal pain and vomiting.       Boating is constant and abd pain is lower abdomen below umbilicus( chronic for him) Will see GI in Jan 2020. Only goes to have BM 1x/week. Takes a laxative to have a BM.  Wakes up in am with nausea and spits up white and better matter, but ones he takes the Prilosec it resolves. He eats last around 8-9p, and goes to bed 12-1 am.   Endocrine: Positive for cold intolerance, polydipsia and polyuria. Negative for heat intolerance  and polyphagia.  Genitourinary: Positive for urgency. Negative for decreased urine volume, difficulty urinating, discharge, dysuria, enuresis, flank pain, frequency, genital sores, hematuria, penile pain, penile swelling, scrotal swelling and testicular pain.       Has nocturia- 4-6 times per night, but does not drink at night time. Stops drinking fluids 6 pm. Has day time urgency  Musculoskeletal: Positive for back pain. Negative for arthralgias, gait problem, joint swelling, myalgias, neck pain and neck stiffness.       BACK- constant pain, sharp pain, and does not radiate. Pain is alleviated hot water, Percocet.  Muscle relaxer do not help.  Has had visits with a chiropractor for 5 months which helped a little, and he told him he should see a spine specialist. Never had an MRI.  Skin: Negative for color change, pallor, rash and wound.  Allergic/Immunologic: Negative for environmental allergies, food allergies and immunocompromised state.  Neurological: Negative for dizziness, tremors, seizures, syncope, speech difficulty, weakness, light-headedness and numbness.       Has burning sensation from his feet to his mid shin which lasts up to 30 minutes 2/day. X 1995 and has been getting worse since then. Has never seen neurology for this.   Hematological: Negative for adenopathy. Does not bruise/bleed easily.  Psychiatric/Behavioral: Negative for hallucinations, sleep disturbance and suicidal ideas. The patient is nervous/anxious.        Denies night mares  PTSD in the past due to seeing someone shot themselves     Today's Vitals   12/24/17 1111  BP: 116/68  Pulse: 78  Temp: 98.1 F (36.7 C)  TempSrc: Oral  SpO2: 98%  Weight: 175 lb 12.8 oz (79.7 kg)  Height: 6\' 2"  (1.88 m)   Body mass index is 22.57 kg/m.   Objective:  Physical Exam  BP 116/68 (BP Location: Left Arm, Patient Position: Sitting, Cuff Size: Normal)   Pulse 78   Temp 98.1 F (36.7 C) (Oral)   Ht 6\' 2"  (1.88 m)   Wt  175 lb 12.8 oz (79.7 kg)   SpO2 98%   BMI 22.57 kg/m   General Appearance:    Alert, cooperative, no distress, appears stated age  Head:    Normocephalic, without obvious abnormality, atraumatic  Eyes:    PERRL, conjunctiva/corneas clear, EOM's intact, fundi    benign, both eyes       Ears:    Normal TM's and external ear canals, both ears  Nose:   Nares normal, septum midline, mucosa normal, no drainage   or sinus tenderness  Throat:   Lips, mucosa, and tongue normal; teeth and gums normal  Neck:   Supple, symmetrical, trachea midline, no  adenopathy;       thyroid:  No enlargement/tenderness/nodules; no carotid   bruit   Back:     Symmetric, no curvature, ROM normal, no CVA tenderness. Has local muscular tenderness on lower lumbar region. Anterior flexion only to 70 degrees due to stiffness and pain.   Lungs:     Clear to auscultation bilaterally, respirations unlabored  Chest wall:    No tenderness or deformity  Heart:    Regular rate and rhythm, S1 and S2 normal, no murmur, rub   or gallop  Abdomen:     Soft, non-tender, bowel sounds active all four quadrants,    no masses, no organomegaly  Genitalia:    Normal male without lesion, discharge or tenderness  Rectal:    Normal tone,  Prostate revealed no nodules, but the distal area feels a little boggy, has no tenderness;   guaiac negative stool  Extremities:   Extremities normal, atraumatic, no cyanosis or edema  Pulses:   +2/ 4 on both dorsalis pedis, and femorals + 1/4 on R PT, and could not find one on L PT.   Skin:   Skin color, texture, turgor normal, no rashes or lesions  Lymph nodes:   Cervical, supraclavicular, and axillary nodes normal  Neurologic:   CNII-XII intact. Normal strength, sensation and reflexes      Throughout. [place], tandem gait, heel and tip toe gait normal.       Assessment And Plan:     1. Need for influenza vaccination- - Flu Vaccine QUAD 36+ mos IM  2. Routine general medical examination at a  health care facility- routine. FU in 1 y - POCT Urinalysis Dipstick (81002)- negative  3. Nocturia- chronic. Sent to Urology.  - Ambulatory referral to Urology  4. Burning feet syndrome- chronic.  - Vitamin B12  5. Other chronic pain- back, lower legs and feet bilaterally. I will have him come back in 1-2 weeks to evaluate his chronic back pain further since he only saw chiropractor.   6. Pain of lower abdomen- chronic. Bentyl and Prilosec helping so far and is not vomiting any more.  Has GI apt in January. I will have him add Reglan at night time.  - metoCLOPramide (REGLAN) 10 MG tablet; 1-2 qhs  Dispense: 60 tablet; Refill: 0  7. Screening for prostate cancer- screen - PSA 8. Screen for colon cancer- hemoccult negative today.   9. Slow transit constipation- chronic, also could be due to opiates. I educated him to avoid laxative and use stool softner instead and diet information to help constipation given.  - docusate sodium (COLACE) 100 MG capsule; Take 1 capsule (100 mg total) by mouth 2 (two) times daily.  Dispense: 60 capsule; Refill: 2   Erin Uecker RODRIGUEZ-SOUTHWORTH, PA-C

## 2017-12-24 NOTE — Patient Instructions (Addendum)
Constipation, Adult Constipation is when a person:  Poops (has a bowel movement) fewer times in a week than normal.  Has a hard time pooping.  Has poop that is dry, hard, or bigger than normal.  Follow these instructions at home: Eating and drinking   Eat foods that have a lot of fiber, such as: ? Fresh fruits and vegetables. ? Whole grains. ? Beans.  Eat less of foods that are high in fat, low in fiber, or overly processed, such as: ? [place] fries. ? Hamburgers. ? Cookies. ? Candy. ? Soda.  Drink enough fluid to keep your pee (urine) clear or pale yellow. General instructions  Exercise regularly or as told by your doctor.  Go to the restroom when you feel like you need to poop. Do not hold it in.  Take over-the-counter and prescription medicines only as told by your doctor. These include any fiber supplements.  Do pelvic floor retraining exercises, such as: ? Doing deep breathing while relaxing your lower belly (abdomen). ? Relaxing your pelvic floor while pooping.  Watch your condition for any changes.  Keep all follow-up visits as told by your doctor. This is important. Contact a doctor if:  You have pain that gets worse.  You have a fever.  You have not pooped for 4 days.  You throw up (vomit).  You are not hungry.  You lose weight.  You are bleeding from the anus.  You have thin, pencil-like poop (stool). Get help right away if:  You have a fever, and your symptoms suddenly get worse.  You leak poop or have blood in your poop.  Your belly feels hard or bigger than normal (is bloated).  You have very bad belly pain.  You feel dizzy or you faint. This information is not intended to replace advice given to you by your health care provider. Make sure you discuss any questions you have with your health care provider. Document Released: 07/18/2007 Document Revised: 08/19/2015 Document Reviewed: 07/20/2015 Elsevier Interactive Patient Education   2018 [place] Years, Male Preventive care refers to lifestyle choices and visits with your health care provider that can promote health and wellness. What does preventive care include?  A yearly physical exam. This is also called an annual well check.  Dental exams once or twice a year.  Routine eye exams. Ask your health care provider how often you should have your eyes checked.  Personal lifestyle choices, including: ? Daily care of your teeth and gums. ? Regular physical activity. ? Eating a healthy diet. ? Avoiding tobacco and drug use. ? Limiting alcohol use. ? Practicing safe sex. ? Taking low-dose aspirin every day starting at age 40. What happens during an annual well check? The services and screenings done by your health care provider during your annual well check will depend on your age, overall health, lifestyle risk factors, and family history of disease. Counseling Your health care provider may ask you questions about your:  Alcohol use.  Tobacco use.  Drug use.  Emotional well-being.  Home and relationship well-being.  Sexual activity.  Eating habits.  Work and work Statistician.  Screening You may have the following tests or measurements:  Height, weight, and BMI.  Blood pressure.  Lipid and cholesterol levels. These may be checked every 5 years, or more frequently if you are over 26 years old.  Skin check.  Lung cancer screening. You may have this screening every year starting at age 103 if  you have a 30-pack-year history of smoking and currently smoke or have quit within the past 15 years.  Fecal occult blood test (FOBT) of the stool. You may have this test every year starting at age 57.  Flexible sigmoidoscopy or colonoscopy. You may have a sigmoidoscopy every 5 years or a colonoscopy every 10 years starting at age 53.  Prostate cancer screening. Recommendations will vary depending on your family history and other  risks.  Hepatitis C blood test.  Hepatitis B blood test.  Sexually transmitted disease (STD) testing.  Diabetes screening. This is done by checking your blood sugar (glucose) after you have not eaten for a while (fasting). You may have this done every 1-3 years.  Discuss your test results, treatment options, and if necessary, the need for more tests with your health care provider. Vaccines Your health care provider may recommend certain vaccines, such as:  Influenza vaccine. This is recommended every year.  Tetanus, diphtheria, and acellular pertussis (Tdap, Td) vaccine. You may need a Td booster every 10 years.  Varicella vaccine. You may need this if you have not been vaccinated.  Zoster vaccine. You may need this after age 19.  Measles, mumps, and rubella (MMR) vaccine. You may need at least one dose of MMR if you were born in 1957 or later. You may also need a second dose.  Pneumococcal 13-valent conjugate (PCV13) vaccine. You may need this if you have certain conditions and have not been vaccinated.  Pneumococcal polysaccharide (PPSV23) vaccine. You may need one or two doses if you smoke cigarettes or if you have certain conditions.  Meningococcal vaccine. You may need this if you have certain conditions.  Hepatitis A vaccine. You may need this if you have certain conditions or if you travel or work in places where you may be exposed to hepatitis A.  Hepatitis B vaccine. You may need this if you have certain conditions or if you travel or work in places where you may be exposed to hepatitis B.  Haemophilus influenzae type b (Hib) vaccine. You may need this if you have certain risk factors.  Talk to your health care provider about which screenings and vaccines you need and how often you need them. This information is not intended to replace advice given to you by your health care provider. Make sure you discuss any questions you have with your health care provider. Document  Released: 02/25/2015 Document Revised: 10/19/2015 Document Reviewed: 11/30/2014 Elsevier Interactive Patient Education  [employer].

## 2017-12-25 ENCOUNTER — Other Ambulatory Visit: Payer: Self-pay | Admitting: Internal Medicine

## 2017-12-25 LAB — PSA: Prostate Specific Ag, Serum: 0.7 ng/mL (ref 0.0–4.0)

## 2017-12-25 LAB — VITAMIN B12: Vitamin B-12: 405 pg/mL (ref 232–1245)

## 2017-12-25 MED ORDER — B-12 1000 MCG SL SUBL: 1.0000 | SUBLINGUAL_TABLET | Freq: Every day | SUBLINGUAL | 1 refills | Status: AC

## 2017-12-25 NOTE — Progress Notes (Unsigned)
B12 1000 mcg sublingual sent to his pharamcy

## 2017-12-31 ENCOUNTER — Ambulatory Visit: Payer: Medicare HMO | Admitting: Internal Medicine

## 2018-02-14 ENCOUNTER — Encounter: Payer: Self-pay | Admitting: Nurse Practitioner

## 2018-02-14 ENCOUNTER — Ambulatory Visit (INDEPENDENT_AMBULATORY_CARE_PROVIDER_SITE_OTHER): Payer: Medicare HMO | Admitting: Nurse Practitioner

## 2018-02-14 VITALS — BP 120/70 | HR 70 | Temp 98.5°F | Ht 69.0 in | Wt 186.2 lb

## 2018-02-14 DIAGNOSIS — K029 Dental caries, unspecified: Secondary | ICD-10-CM | POA: Diagnosis not present

## 2018-02-14 MED ORDER — AMOXICILLIN 875 MG PO TABS
875.0000 mg | ORAL_TABLET | Freq: Two times a day (BID) | ORAL | 0 refills | Status: DC
Start: 2018-02-14 — End: 2018-07-03

## 2018-02-14 MED ORDER — OXYCODONE-ACETAMINOPHEN 7.5-325 MG PO TABS: 1.0000 | ORAL_TABLET | ORAL | 0 refills | Status: DC | PRN

## 2018-02-14 NOTE — Progress Notes (Signed)
Subjective:     Patient ID: [name] , male    DOB: [birth year] , 48 y.o.   MRN: [id number]   Chief Complaint  Patient presents with  . Dental Pain    HPI  Dental Pain   This is a chronic problem. The current episode started in the past 7 days. The problem occurs constantly. The problem has been unchanged. The pain is at a severity of 10/10. The pain is severe. Associated symptoms include a fever. Pertinent negatives include no facial pain or sinus pressure.     Past Medical History:  Diagnosis Date  . Abdominal pain    intermittent  . Anemia   . Aneurysm ([place])   . Asthma   . GERD (gastroesophageal reflux disease)   . Pancreatitis   . PTSD (post-traumatic stress disorder)    controlled  . PTSD (post-traumatic stress disorder)   . PTSD (post-traumatic stress disorder) 2008  . Rectal polyp 01/09/2006   Hyperplastic  . Seizures ([place])      Family History  Problem Relation Age of Onset  . Stroke Mother   . Hypertension Mother   . Diabetes Father   . Pancreatic cancer Maternal Uncle   . Cancer Other   . Diabetes Other      Current Outpatient Medications:  .  Cyanocobalamin (B-12) 1000 MCG SUBL, Place 1 tablet under the tongue daily., Disp: 90 each, Rfl: 1 .  dicyclomine (BENTYL) 20 MG tablet, Take 1 tablet (20 mg total) by mouth 4 (four) times daily -  before meals and at bedtime., Disp: 90 tablet, Rfl: 1 .  docusate sodium (COLACE) 100 MG capsule, Take 1 capsule (100 mg total) by mouth 2 (two) times daily., Disp: 60 capsule, Rfl: 2 .  folic acid (FOLVITE) 1 MG tablet, Take 1 tablet (1 mg total) by mouth daily., Disp: 90 tablet, Rfl: 1 .  levETIRAcetam (KEPPRA) 500 MG tablet, Take 1 tablet (500 mg total) by mouth 2 (two) times daily., Disp: 60 tablet, Rfl: 3 .  lipase/protease/amylase (CREON) 36000 UNITS CPEP capsule, Take 72,000-108,000 Units by mouth 5 (five) times daily. Pt takes three capsules three times daily with meals and two capsules two times daily with  snacks., Disp: , Rfl:  .  loratadine (CLARITIN) 10 MG tablet, Take 1 tablet (10 mg total) by mouth daily., Disp: 90 tablet, Rfl: 1 .  methocarbamol (ROBAXIN) 500 MG tablet, Take 1 tablet (500 mg total) by mouth at bedtime as needed for muscle spasms., Disp: 12 tablet, Rfl: 0 .  metoCLOPramide (REGLAN) 10 MG tablet, 1-2 qhs, Disp: 60 tablet, Rfl: 0 .  Multiple Vitamin (MULTIVITAMIN WITH MINERALS) TABS tablet, Take 1 tablet by mouth daily., Disp: , Rfl:  .  omeprazole (PRILOSEC) 40 MG capsule, Take 1 capsule (40 mg total) by mouth daily., Disp: 90 capsule, Rfl: 1 .  ondansetron (ZOFRAN) 4 MG tablet, Take 1 tablet (4 mg total) by mouth every 6 (six) hours., Disp: 12 tablet, Rfl: 0 .  tetrahydrozoline-zinc (VISINE-AC) 0.05-0.25 % ophthalmic solution, Place 2 drops into both eyes 3 (three) times daily as needed (Itching)., Disp: , Rfl:  .  thiamine (VITAMIN B-1) 100 MG tablet, Take 1 tablet (100 mg total) by mouth daily., Disp: 90 tablet, Rfl: 1   Allergies  Allergen Reactions  . Aspirin Anaphylaxis  . Ibuprofen Anaphylaxis  . Hydrocodone Hives and Itching     Review of Systems  Constitutional: Positive for fever.  HENT: Negative for sinus pressure.  Today's Vitals   02/14/18 1439  BP: 120/70  Pulse: 70  Temp: 98.5 F (36.9 C)  TempSrc: Oral  SpO2: 96%  Weight: 186 lb 3.2 oz (84.5 kg)  Height: 5\' 9"  (1.753 m)  PainSc: 10-Worst pain ever  PainLoc: Mouth   Body mass index is 27.5 kg/m.   Objective:  Physical Exam Constitutional:      Appearance: Normal appearance.  HENT:     Mouth/Throat:     Mouth: Mucous membranes are moist.     Dentition: Dental tenderness and dental caries present. No gingival swelling.   Cardiovascular:     Rate and Rhythm: Normal rate and regular rhythm.     Pulses: Normal pulses.     Heart sounds: Normal heart sounds. No murmur.  Pulmonary:     Effort: Pulmonary effort is normal. No respiratory distress.     Breath sounds: Normal breath  sounds.  Neurological:     General: No focal deficit present.     Mental Status: He is alert.         Assessment And Plan:     1. Dental caries  Encouraged to contact dentist to have dental caries treatment or excision - amoxicillin (AMOXIL) 875 MG tablet; Take 1 tablet (875 mg total) by mouth 2 (two) times daily.  Dispense: 14 tablet; Refill: 0 - oxyCODONE-acetaminophen (PERCOCET) 7.5-325 MG tablet; Take 1 tablet by mouth every 4 (four) hours as needed for severe pain.  Dispense: 20 tablet; Refill: 0       [name], FNP

## 2018-02-18 ENCOUNTER — Ambulatory Visit: Payer: Medicare HMO | Admitting: Neurology

## 2018-02-18 ENCOUNTER — Ambulatory Visit: Payer: Medicare Other | Admitting: Neurology

## 2018-02-18 IMAGING — DX DG FOREARM 2V*L*
2 series · 2 of 2 positions shown · non-contrast
Comparison: None.

CLINICAL DATA: 45 y/o M; throbbing pain in the left forearm for 3
days.

EXAM:
LEFT FOREARM - 2 VIEW

[forearm ap]
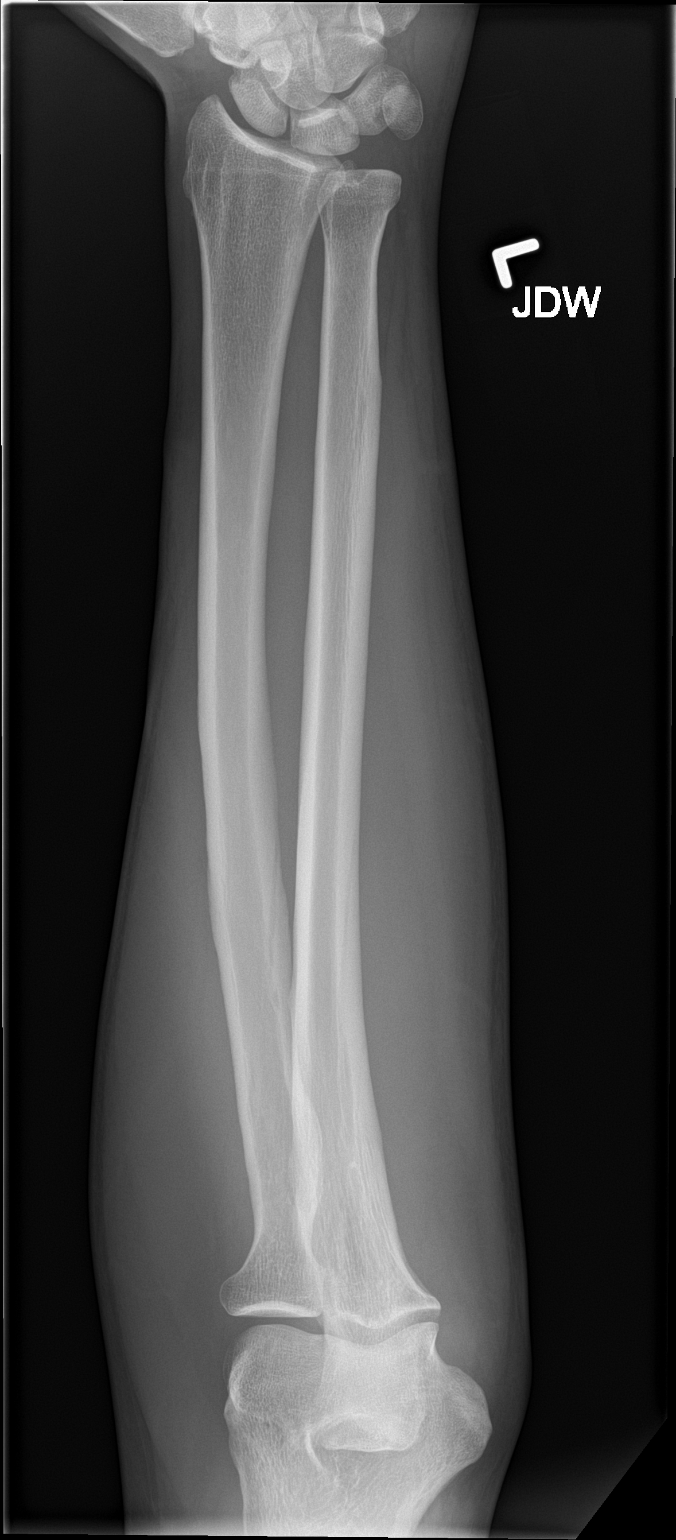

[forearm lat]
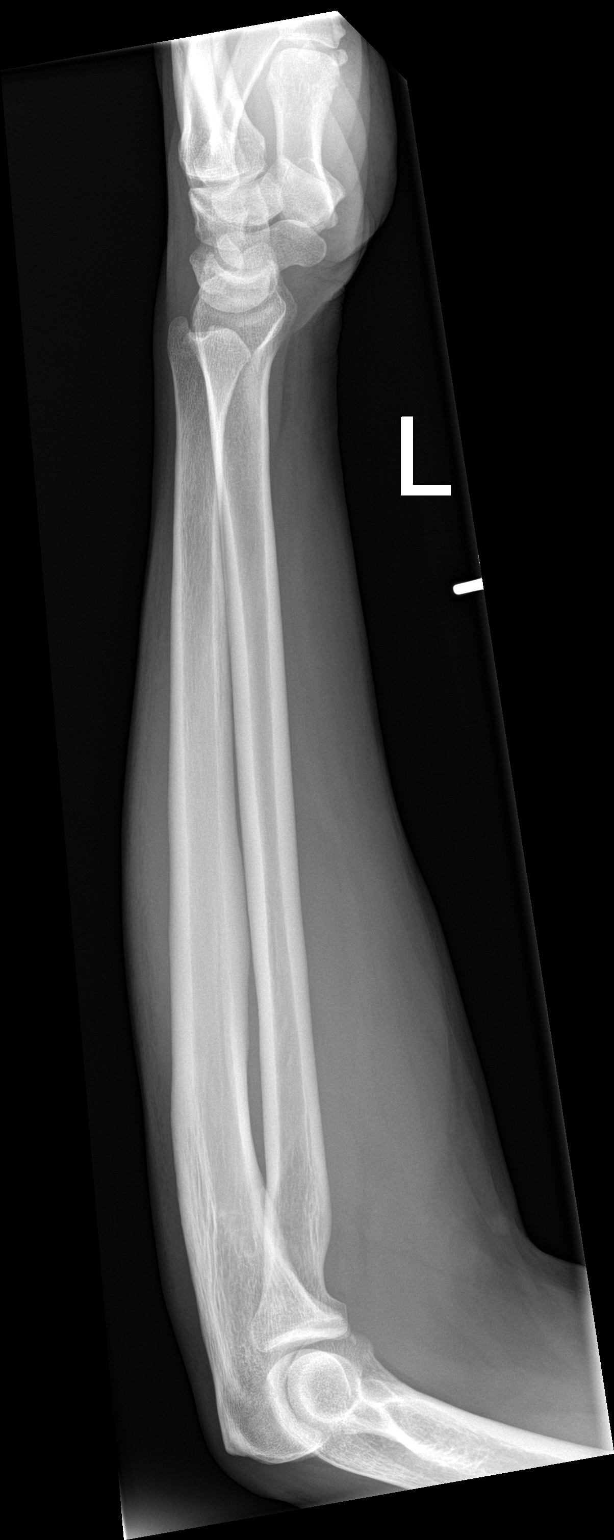

[2 of 2 positions shown; findings below may reference images not displayed]

FINDINGS: There is no evidence of fracture or other focal bone lesions. Soft
tissues are unremarkable.
IMPRESSION: No acute fracture or dislocation identified.

By: Hyacinth Schmitz M.D.

## 2018-03-04 ENCOUNTER — Encounter: Payer: Self-pay | Admitting: Nurse Practitioner

## 2018-03-15 ENCOUNTER — Other Ambulatory Visit: Payer: Self-pay | Admitting: Internal Medicine

## 2018-03-29 ENCOUNTER — Other Ambulatory Visit: Payer: Self-pay | Admitting: Internal Medicine

## 2018-03-29 DIAGNOSIS — R1010 Upper abdominal pain, unspecified: Secondary | ICD-10-CM

## 2018-04-02 ENCOUNTER — Ambulatory Visit (INDEPENDENT_AMBULATORY_CARE_PROVIDER_SITE_OTHER): Payer: Medicare HMO

## 2018-04-02 VITALS — BP 138/80 | HR 72 | Temp 98.6°F | Ht 71.0 in | Wt 180.6 lb

## 2018-04-02 DIAGNOSIS — Z23 Encounter for immunization: Secondary | ICD-10-CM | POA: Diagnosis not present

## 2018-04-02 DIAGNOSIS — Z0000 Encounter for general adult medical examination without abnormal findings: Secondary | ICD-10-CM

## 2018-04-02 NOTE — Progress Notes (Signed)
Subjective:   [name] is a 48 y.o. male who presents for an Initial Medicare Annual Wellness Visit.  Review of Systems  n/a Cardiac Risk Factors include: smoking/ tobacco exposure;male gender    Objective:    Today's Vitals   04/02/18 1045  BP: 138/80  Pulse: 72  Temp: 98.6 F (37 C)  TempSrc: Oral  SpO2: 99%  Weight: 180 lb 9.6 oz (81.9 kg)  Height: 5\' 11"  (1.803 m)  PainSc: 0-No pain   Body mass index is 25.19 kg/m.  Advanced Directives 04/02/2018 09/25/2016 06/23/2016 05/01/2016 02/26/2016 02/21/2016 05/06/2015  Does Patient Have a Medical Advance Directive? No No No No No No No  Would patient like information on creating a medical advance directive? No - Patient declined - No - Patient declined - No - Patient declined No - Patient declined -    Current Medications (verified) Outpatient Encounter Medications as of 04/02/2018  Medication Sig  . amoxicillin (AMOXIL) 875 MG tablet Take 1 tablet (875 mg total) by mouth 2 (two) times daily.  [name] dicyclomine (BENTYL) 20 MG tablet Take 1 tablet (20 mg total) by mouth 4 (four) times daily -  before meals and at bedtime.  . docusate sodium (COLACE) 100 MG capsule Take 1 capsule (100 mg total) by mouth 2 (two) times daily.  . folic acid (FOLVITE) 1 MG tablet TAKE 1 TABLET BY MOUTH EVERY DAY  . levETIRAcetam (KEPPRA) 500 MG tablet Take 1 tablet (500 mg total) by mouth 2 (two) times daily.  . lipase/protease/amylase (CREON) 36000 UNITS CPEP capsule Take 72,000-108,000 Units by mouth 5 (five) times daily. Pt takes three capsules three times daily with meals and two capsules two times daily with snacks.  . loratadine (CLARITIN) 10 MG tablet Take 1 tablet (10 mg total) by mouth daily.  . methocarbamol (ROBAXIN) 500 MG tablet Take 1 tablet (500 mg total) by mouth at bedtime as needed for muscle spasms.  . metoCLOPramide (REGLAN) 10 MG tablet TAKE 1 TO 2 TABLETS BY MOUTH AT BEDTIME  . Multiple Vitamin (MULTIVITAMIN WITH MINERALS) TABS  tablet Take 1 tablet by mouth daily.  [name] omeprazole (PRILOSEC) 40 MG capsule Take 1 capsule (40 mg total) by mouth daily.  . ondansetron (ZOFRAN) 4 MG tablet Take 1 tablet (4 mg total) by mouth every 6 (six) hours.  [name] tetrahydrozoline-zinc (VISINE-AC) 0.05-0.25 % ophthalmic solution Place 2 drops into both eyes 3 (three) times daily as needed (Itching).  . thiamine (VITAMIN B-1) 100 MG tablet Take 1 tablet (100 mg total) by mouth daily.  [name] oxyCODONE-acetaminophen (PERCOCET) 7.5-325 MG tablet Take 1 tablet by mouth every 4 (four) hours as needed for severe pain. (Patient not taking: Reported on 04/02/2018)   No facility-administered encounter medications on file as of 04/02/2018.     Allergies (verified) Aspirin; Ibuprofen; Hydrocodone; and Tylenol [acetaminophen]   History: Past Medical History:  Diagnosis Date  . Abdominal pain    intermittent  . Anemia   . Aneurysm ([place])   . Asthma   . GERD (gastroesophageal reflux disease)   . Pancreatitis   . PTSD (post-traumatic stress disorder)    controlled  . PTSD (post-traumatic stress disorder)   . PTSD (post-traumatic stress disorder) 2008  . Rectal polyp 01/09/2006   Hyperplastic  . Seizures ([place])    Past Surgical History:  Procedure Laterality Date  . arm surgery Left    repair of tendons "work injury cut"-"pinky finger numb"  . BURR HOLE Right 02/28/2015   Procedure:  BURR HOLES FOR SUBDURAL HEMATOMA;  Surgeon: Newman Pies, MD;  Location: [facility];  Service: Neurosurgery;  Laterality: Right;  . CEREBRAL ANEURYSM REPAIR    . COLONOSCOPY  2016   colon polyps  . ESOPHAGOGASTRODUODENOSCOPY (EGD) WITH PROPOFOL N/A 01/13/2015   Procedure: ESOPHAGOGASTRODUODENOSCOPY (EGD) WITH PROPOFOL;  Surgeon: [name], MD;  Location: [facility];  Service: Endoscopy;  Laterality: N/A;  . ESOPHAGOGASTRODUODENOSCOPY ENDOSCOPY    . HERNIA REPAIR     Umbilical  . laceration of left arm    . 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 Stroke Mother   . Hypertension Mother   . Diabetes Father   . Pancreatic cancer Maternal Uncle   . Cancer Other   . Diabetes Other    Social History   Socioeconomic History  . Marital status: Single    Spouse name: Not on file  . Number of children: Not on file  . Years of education: Not on file  . Highest education level: Not on file  Occupational History  . Occupation: disable  Social Needs  . Financial resource strain: Not hard at all  . Food insecurity:    Worry: Never true    Inability: Never true  . Transportation needs:    Medical: No    Non-medical: No  Tobacco Use  . Smoking status: Current Every Day Smoker    Packs/day: 0.00    Types: Cigars  . Smokeless tobacco: Never Used  . Tobacco comment: smokes a black and mild daily  Substance and Sexual Activity  . Alcohol use: No  . Drug use: Yes    Types: Marijuana    Comment: Quit-none in 1 month  . Sexual activity: Yes  Lifestyle  . Physical activity:    Days per week: 7 days    Minutes per session: 30 min  . Stress: Not on file  Relationships  . Social connections:    Talks on phone: Not on file    Gets together: Not on file    Attends religious service: Not on file    Active member of club or organization: Not on file    Attends meetings of clubs or organizations: Not on file    Relationship status: Not on file  Other Topics Concern  . Not on file  Social History Narrative  . Not on file   Tobacco Counseling Ready to quit: Not Answered Counseling given: Not Answered Comment: smokes a black and mild daily   Clinical Intake:  Pre-visit preparation completed: Yes  Pain : No/denies pain Pain Score: 0-No pain     Nutritional Status: BMI 25 -29 Overweight Nutritional Risks: Nausea/ vomitting/ diarrhea(nausea and vomiting daily) Diabetes: No  How often do you need to have someone help you when you read instructions, pamphlets, or  other written materials from your doctor or pharmacy?: 1 - Never What is the last grade level you completed in school?: 12th grade     Information entered by :: NAllen LPN  Activities of Daily Living In your present state of health, do you have any difficulty performing the following activities: 04/02/2018  Hearing? N  Vision? Y  Comment blurry, distorted vision  Difficulty concentrating or making decisions? N  Walking or climbing stairs? Y  Comment back pains   Dressing or bathing? N  Doing errands, shopping? N  Preparing Food and eating ? N  Using the Toilet? N  In the past six months, have you accidently leaked urine? Y  Comment occasional leakage  Do you have problems with loss of bowel control? N  Managing your Medications? N  Managing your Finances? N  Housekeeping or managing your Housekeeping? N  Some recent data might be hidden     Immunizations and Health Maintenance Immunization History  Administered Date(s) Administered  . Influenza Whole 01/12/2009, 11/18/2009  . Influenza,inj,Quad PF,6+ Mos 03/01/2015, 12/24/2017  . Pneumococcal Polysaccharide-23 03/02/2015   Health Maintenance Due  Topic Date Due  . Samul Dada  08/16/1989    Patient Care Team: Shelby Mattocks, PA-C as PCP - General (Internal Medicine)  Indicate any recent Medical Services you may have received from other than [facility] providers in the past year (date may be approximate).    Assessment:   This is a routine wellness examination for [place].  Hearing/Vision screen Vision Screening Comments: No annual eye exams  Dietary issues and exercise activities discussed: Current Exercise Habits: Home exercise routine, Type of exercise: stretching, Time (Minutes): 30, Frequency (Times/Week): 7, Weekly Exercise (Minutes/Week): 210, Intensity: Moderate, Exercise limited by: None identified  Goals    . Patient Stated (pt-stated)     Wants to get up to 300 pounds.      Depression  Screen PHQ 2/9 Scores 04/02/2018 02/14/2018 12/24/2017 11/26/2017  PHQ - 2 Score 2 0 4 0  PHQ- 9 Score 7 - 16 -    Fall Risk Fall Risk  04/02/2018 02/14/2018 11/26/2017  Falls in the past year? 0 0 No    Is the patient's home free of loose throw rugs in walkways, pet beds, electrical cords, etc?   yes      Grab bars in the bathroom? no      Handrails on the stairs?   no      Adequate lighting?   yes  Timed Get Up and Go performed: n/a  Cognitive Function:     6CIT Screen 04/02/2018  What Year? 0 points  What month? 0 points  What time? 0 points  Count back from 20 0 points  Months in reverse 4 points  Repeat phrase 0 points  Total Score 4    Screening Tests Health Maintenance  Topic Date Due  . TETANUS/TDAP  08/16[birth year]1987  . INFLUENZA VACCINE  Completed  . HIV Screening  Completed    Qualifies for Shingles Vaccine? no  Cancer Screenings: Lung: Low Dose CT Chest recommended if Age 14-80 years, 30 pack-year currently smoking OR have quit w/in 15years. Patient does not qualify. Colorectal: n/a  Additional Screenings:  Hepatitis C Screening: n/a      Plan:    TDAP sent to pharmacy. Patient states he wants to get to 300 pounds.  I have personally reviewed and noted the following in the patient's chart:   . Medical and social history . Use of alcohol, tobacco or illicit drugs  . Current medications and supplements . Functional ability and status . Nutritional status . Physical activity . Advanced directives . List of other physicians . Hospitalizations, surgeries, and ER visits in previous 12 months . Vitals . Screenings to include cognitive, depression, and falls . Referrals and appointments  In addition, I have reviewed and discussed with patient certain preventive protocols, quality metrics, and best practice recommendations. A written personalized care plan for preventive services as well as general preventive health recommendations were provided to patient.      Kellie Simmering, LPN   [id number]

## 2018-04-02 NOTE — Patient Instructions (Signed)
[name] , Thank you for taking time to come for your Medicare Wellness Visit. I appreciate your ongoing commitment to your health goals. Please review the following plan we discussed and let me know if I can assist you in the future.   Screening recommendations/referrals: Colonoscopy: n/a Recommended yearly ophthalmology/optometry visit for glaucoma screening and checkup Recommended yearly dental visit for hygiene and checkup  Vaccinations: Influenza vaccine: 12/2017 Pneumococcal vaccine: 02/2015 Tdap vaccine: sent to pharmacy Shingles vaccine: n/a    Advanced directives: Advance directive discussed with you today. Even though you declined this today please call our office should you change your mind and we can give you the proper paperwork for you to fill out.   Conditions/risks identified: Overweight  Next appointment: 07/01/2018 at 10:00  Preventive Care 40-64 Years, Male Preventive care refers to lifestyle choices and visits with your health care provider that can promote health and wellness. What does preventive care include?  A yearly physical exam. This is also called an annual well check.  Dental exams once or twice a year.  Routine eye exams. Ask your health care provider how often you should have your eyes checked.  Personal lifestyle choices, including:  Daily care of your teeth and gums.  Regular physical activity.  Eating a healthy diet.  Avoiding tobacco and drug use.  Limiting alcohol use.  Practicing safe sex.  Taking low-dose aspirin every day starting at age 74. What happens during an annual well check? The services and screenings done by your health care provider during your annual well check will depend on your age, overall health, lifestyle risk factors, and family history of disease. Counseling  Your health care provider may ask you questions about your:  Alcohol use.  Tobacco use.  Drug use.  Emotional well-being.  Home and  relationship well-being.  Sexual activity.  Eating habits.  Work and work Statistician. Screening  You may have the following tests or measurements:  Height, weight, and BMI.  Blood pressure.  Lipid and cholesterol levels. These may be checked every 5 years, or more frequently if you are over 67 years old.  Skin check.  Lung cancer screening. You may have this screening every year starting at age 67 if you have a 30-pack-year history of smoking and currently smoke or have quit within the past 15 years.  Fecal occult blood test (FOBT) of the stool. You may have this test every year starting at age 59.  Flexible sigmoidoscopy or colonoscopy. You may have a sigmoidoscopy every 5 years or a colonoscopy every 10 years starting at age 33.  Prostate cancer screening. Recommendations will vary depending on your family history and other risks.  Hepatitis C blood test.  Hepatitis B blood test.  Sexually transmitted disease (STD) testing.  Diabetes screening. This is done by checking your blood sugar (glucose) after you have not eaten for a while (fasting). You may have this done every 1-3 years. Discuss your test results, treatment options, and if necessary, the need for more tests with your health care provider. Vaccines  Your health care provider may recommend certain vaccines, such as:  Influenza vaccine. This is recommended every year.  Tetanus, diphtheria, and acellular pertussis (Tdap, Td) vaccine. You may need a Td booster every 10 years.  Zoster vaccine. You may need this after age 62.  Pneumococcal 13-valent conjugate (PCV13) vaccine. You may need this if you have certain conditions and have not been vaccinated.  Pneumococcal polysaccharide (PPSV23) vaccine. You may need one  or two doses if you smoke cigarettes or if you have certain conditions. Talk to your health care provider about which screenings and vaccines you need and how often you need them. This information is  not intended to replace advice given to you by your health care provider. Make sure you discuss any questions you have with your health care provider. Document Released: 02/25/2015 Document Revised: 10/19/2015 Document Reviewed: 11/30/2014 Elsevier Interactive Patient Education  2017 [place] Prevention in the Home Falls can cause injuries. They can happen to people of all ages. There are many things you can do to make your home safe and to help prevent falls. What can I do on the outside of my home?  Regularly fix the edges of walkways and driveways and fix any cracks.  Remove anything that might make you trip as you walk through a door, such as a raised step or threshold.  Trim any bushes or trees on the path to your home.  Use bright outdoor lighting.  Clear any walking paths of anything that might make someone trip, such as rocks or tools.  Regularly check to see if handrails are loose or broken. Make sure that both sides of any steps have handrails.  Any raised decks and porches should have guardrails on the edges.  Have any leaves, snow, or ice cleared regularly.  Use sand or salt on walking paths during winter.  Clean up any spills in your garage right away. This includes oil or grease spills. What can I do in the bathroom?  Use night lights.  Install grab bars by the toilet and in the tub and shower. Do not use towel bars as grab bars.  Use non-skid mats or decals in the tub or shower.  If you need to sit down in the shower, use a plastic, non-slip stool.  Keep the floor dry. Clean up any water that spills on the floor as soon as it happens.  Remove soap buildup in the tub or shower regularly.  Attach bath mats securely with double-sided non-slip rug tape.  Do not have throw rugs and other things on the floor that can make you trip. What can I do in the bedroom?  Use night lights.  Make sure that you have a light by your bed that is easy to  reach.  Do not use any sheets or blankets that are too big for your bed. They should not hang down onto the floor.  Have a firm chair that has side arms. You can use this for support while you get dressed.  Do not have throw rugs and other things on the floor that can make you trip. What can I do in the kitchen?  Clean up any spills right away.  Avoid walking on wet floors.  Keep items that you use a lot in easy-to-reach places.  If you need to reach something above you, use a strong step stool that has a grab bar.  Keep electrical cords out of the way.  Do not use floor polish or wax that makes floors slippery. If you must use wax, use non-skid floor wax.  Do not have throw rugs and other things on the floor that can make you trip. What can I do with my stairs?  Do not leave any items on the stairs.  Make sure that there are handrails on both sides of the stairs and use them. Fix handrails that are broken or loose. Make sure that handrails  are as long as the stairways.  Check any carpeting to make sure that it is firmly attached to the stairs. Fix any carpet that is loose or worn.  Avoid having throw rugs at the top or bottom of the stairs. If you do have throw rugs, attach them to the floor with carpet tape.  Make sure that you have a light switch at the top of the stairs and the bottom of the stairs. If you do not have them, ask someone to add them for you. What else can I do to help prevent falls?  Wear shoes that:  Do not have high heels.  Have rubber bottoms.  Are comfortable and fit you well.  Are closed at the toe. Do not wear sandals.  If you use a stepladder:  Make sure that it is fully opened. Do not climb a closed stepladder.  Make sure that both sides of the stepladder are locked into place.  Ask someone to hold it for you, if possible.  Clearly mark and make sure that you can see:  Any grab bars or handrails.  First and last steps.  Where the  edge of each step is.  Use tools that help you move around (mobility aids) if they are needed. These include:  Canes.  Walkers.  Scooters.  Crutches.  Turn on the lights when you go into a dark area. Replace any light bulbs as soon as they burn out.  Set up your furniture so you have a clear path. Avoid moving your furniture around.  If any of your floors are uneven, fix them.  If there are any pets around you, be aware of where they are.  Review your medicines with your doctor. Some medicines can make you feel dizzy. This can increase your chance of falling. Ask your doctor what other things that you can do to help prevent falls. This information is not intended to replace advice given to you by your health care provider. Make sure you discuss any questions you have with your health care provider. Document Released: 11/25/2008 Document Revised: 07/07/2015 Document Reviewed: 03/05/2014 Elsevier Interactive Patient Education  2017 [employer].

## 2018-04-24 ENCOUNTER — Ambulatory Visit: Payer: Medicare HMO | Admitting: Neurology

## 2018-05-01 ENCOUNTER — Ambulatory Visit (INDEPENDENT_AMBULATORY_CARE_PROVIDER_SITE_OTHER): Payer: Medicare HMO | Admitting: Neurology

## 2018-05-01 ENCOUNTER — Encounter: Payer: Self-pay | Admitting: Neurology

## 2018-05-01 ENCOUNTER — Other Ambulatory Visit: Payer: Self-pay

## 2018-05-01 VITALS — BP 114/70 | HR 79 | Temp 98.7°F | Ht 69.0 in | Wt 186.0 lb

## 2018-05-01 DIAGNOSIS — G40209 Localization-related (focal) (partial) symptomatic epilepsy and epileptic syndromes with complex partial seizures, not intractable, without status epilepticus: Secondary | ICD-10-CM | POA: Diagnosis not present

## 2018-05-01 DIAGNOSIS — Z8679 Personal history of other diseases of the circulatory system: Secondary | ICD-10-CM | POA: Diagnosis not present

## 2018-05-01 MED ORDER — LEVETIRACETAM 500 MG PO TABS: 500.0000 mg | ORAL_TABLET | Freq: Two times a day (BID) | ORAL | 3 refills | Status: DC

## 2018-05-01 NOTE — Progress Notes (Signed)
NEUROLOGY CONSULTATION NOTE  [name] MRN: [id number] DOB: 21-Apr-1970  Referring provider: Audery Amel, PA-C Primary care provider: Audery Amel, PA-C  Reason for consult:  seizure  Thank you for your kind referral of [name] for consultation of the above symptoms. Although his history is well known to you, please allow me to reiterate it for the purpose of our medical record. He is alone in the office today. Records and images were personally reviewed where available.  HISTORY OF PRESENT ILLNESS: This is a pleasant 48 year old right-handed man with a history of pancreatitis, subdural hematoma with subsequent seizure, presenting to establish care for seizures. In January 2017, he was found in a car with altered mental status, per notes people at the gas station close by had seen him in the car 24 hours prior. He was incontinent of urine and staring at staff in the ER. He was found to have a right subdural hematoma with subfalcine herniation and left ventricular entrapment. He had burr holes to evacuate the hematoma with improvement in mental status. Family brought him back to the ER 3 days after hospital discharge reporting trance-like states, zoning out and staring straight ahead. He was witnessed to have a similar episode in the ER. Repeat head CT showed a persistent small amount of subdural fluid, right to left shift unchanged from prior scan. He was discharged home on Keppra 500mg  BID and denies any further seizures since then. He lives alone and denies being told of any staring/unresponsive episodes, he denies any gaps in time, olfactory/gustatory hallucinations, focal numbness/tingling/weakness, myoclonic jerks. He denies any headaches. He recalls having bad headaches 2 days prior to his subdural, he denies any head injuries. He denies any diplopia, dysarthria/dysphagia, neck pain, bladder dysfunction. He has pancreatitis and wakes up every morning with nausea  and vomiting. He feels better after he vomits but feels dizzy for a minute. He has had back pain even prior to his subdural, he takes Percocet for his stomach and back. He has constipation.   Epilepsy Risk Factors:  History of right subdural hematoma s/p burr hole. Otherwise he had a normal birth and early development.  There is no history of febrile convulsions, CNS infections such as meningitis/encephalitis, or family history of seizures.  I personally reviewed his most recent head CT done 09/2016 which did not show any acute changes, no further evidence of subdural fluid.  PAST MEDICAL HISTORY: Past Medical History:  Diagnosis Date  . Abdominal pain    intermittent  . Anemia   . Aneurysm ([place])   . Asthma   . GERD (gastroesophageal reflux disease)   . Pancreatitis   . PTSD (post-traumatic stress disorder)    controlled  . PTSD (post-traumatic stress disorder)   . PTSD (post-traumatic stress disorder) 2008  . Rectal polyp 01/09/2006   Hyperplastic  . Seizures ([place])     PAST SURGICAL HISTORY: Past Surgical History:  Procedure Laterality Date  . arm surgery Left    repair of tendons "work injury cut"-"pinky finger numb"  . BURR HOLE Right 02/28/2015   Procedure: BURR HOLES FOR SUBDURAL HEMATOMA;  Surgeon: Newman Pies, MD;  Location: [facility];  Service: Neurosurgery;  Laterality: Right;  . CEREBRAL ANEURYSM REPAIR    . COLONOSCOPY  2016   colon polyps  . ESOPHAGOGASTRODUODENOSCOPY (EGD) WITH PROPOFOL N/A 01/13/2015   Procedure: ESOPHAGOGASTRODUODENOSCOPY (EGD) WITH PROPOFOL;  Surgeon: [name], MD;  Location: [facility];  Service: Endoscopy;  Laterality: N/A;  .  ESOPHAGOGASTRODUODENOSCOPY ENDOSCOPY    . HERNIA REPAIR     Umbilical  . laceration of left arm    . 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 amoxicillin (AMOXIL) 875 MG tablet Take 1  tablet (875 mg total) by mouth 2 (two) times daily. 14 tablet 0  . dicyclomine (BENTYL) 20 MG tablet Take 1 tablet (20 mg total) by mouth 4 (four) times daily -  before meals and at bedtime. 90 tablet 1  . folic acid (FOLVITE) 1 MG tablet TAKE 1 TABLET BY MOUTH EVERY DAY 90 tablet 1  . levETIRAcetam (KEPPRA) 500 MG tablet Take 1 tablet (500 mg total) by mouth 2 (two) times daily. 60 tablet 3  . lipase/protease/amylase (CREON) 36000 UNITS CPEP capsule Take 72,000-108,000 Units by mouth 5 (five) times daily. Pt takes three capsules three times daily with meals and two capsules two times daily with snacks.    . loratadine (CLARITIN) 10 MG tablet Take 1 tablet (10 mg total) by mouth daily. 90 tablet 1  . methocarbamol (ROBAXIN) 500 MG tablet Take 1 tablet (500 mg total) by mouth at bedtime as needed for muscle spasms. 12 tablet 0  . metoCLOPramide (REGLAN) 10 MG tablet TAKE 1 TO 2 TABLETS BY MOUTH AT BEDTIME 60 tablet 0  . Multiple Vitamin (MULTIVITAMIN WITH MINERALS) TABS tablet Take 1 tablet by mouth daily.    [name] omeprazole (PRILOSEC) 40 MG capsule Take 1 capsule (40 mg total) by mouth daily. 90 capsule 1  . ondansetron (ZOFRAN) 4 MG tablet Take 1 tablet (4 mg total) by mouth every 6 (six) hours. 12 tablet 0  . tetrahydrozoline-zinc (VISINE-AC) 0.05-0.25 % ophthalmic solution Place 2 drops into both eyes 3 (three) times daily as needed (Itching).    . thiamine (VITAMIN B-1) 100 MG tablet Take 1 tablet (100 mg total) by mouth daily. 90 tablet 1   No current facility-administered medications on file prior to visit.     ALLERGIES: Allergies  Allergen Reactions  . Aspirin Anaphylaxis  . Ibuprofen Anaphylaxis  . Hydrocodone Hives and Itching  . Tylenol [Acetaminophen]     Upset stomach    FAMILY HISTORY: Family History  Problem Relation Age of Onset  . Stroke Mother   . Hypertension Mother   . Diabetes Father   . Pancreatic cancer Maternal Uncle   . Cancer Other   . Diabetes Other      SOCIAL HISTORY: Social History   Socioeconomic History  . Marital status: Single    Spouse name: Not on file  . Number of children: Not on file  . Years of education: Not on file  . Highest education level: Not on file  Occupational History  . Occupation: disable  Social Needs  . Financial resource strain: Not hard at all  . Food insecurity:    Worry: Never true    Inability: Never true  . Transportation needs:    Medical: No    Non-medical: No  Tobacco Use  . Smoking status: Current Every Day Smoker    Packs/day: 0.00    Types: Cigars  . Smokeless tobacco: Never Used  . Tobacco comment: smokes a black and mild daily  Substance and Sexual Activity  . Alcohol use: No  . Drug use: Yes    Types: Marijuana    Comment: Quit-none in 1 month  . Sexual activity: Yes  Lifestyle  .  Physical activity:    Days per week: 7 days    Minutes per session: 30 min  . Stress: Not on file  Relationships  . Social connections:    Talks on phone: Not on file    Gets together: Not on file    Attends religious service: Not on file    Active member of club or organization: Not on file    Attends meetings of clubs or organizations: Not on file    Relationship status: Not on file  . Intimate partner violence:    Fear of current or ex partner: No    Emotionally abused: No    Physically abused: No    Forced sexual activity: No  Other Topics Concern  . Not on file  Social History Narrative  . Not on file    REVIEW OF SYSTEMS: Constitutional: No fevers, chills, or sweats, no generalized fatigue, change in appetite Eyes: No visual changes, double vision, eye pain Ear, nose and throat: No hearing loss, ear pain, nasal congestion, sore throat Cardiovascular: No chest pain, palpitations Respiratory:  No shortness of breath at rest or with exertion, wheezes GastrointestinaI: No nausea, vomiting, diarrhea, abdominal pain, fecal incontinence Genitourinary:  No dysuria, urinary retention or  frequency Musculoskeletal:  No neck pain, +back pain Integumentary: No rash, pruritus, skin lesions Neurological: as above Psychiatric: No depression, insomnia, anxiety Endocrine: No palpitations, fatigue, diaphoresis, mood swings, change in appetite, change in weight, increased thirst Hematologic/Lymphatic:  No anemia, purpura, petechiae. Allergic/Immunologic: no itchy/runny eyes, nasal congestion, recent allergic reactions, rashes  PHYSICAL EXAM: Vitals:   05/01/18 1338  BP: 114/70  Pulse: 79  Temp: 98.7 F (37.1 C)  SpO2: 99%   General: No acute distress Head:  Normocephalic/atraumatic Eyes: Fundoscopic exam shows bilateral sharp discs, no vessel changes, exudates, or hemorrhages Neck: supple, no paraspinal tenderness, full range of motion Back: No paraspinal tenderness Heart: regular rate and rhythm Lungs: Clear to auscultation bilaterally. Vascular: No carotid bruits. Skin/Extremities: No rash, no edema Neurological Exam: Mental status: alert and oriented to person, place, and time, no dysarthria or aphasia, Fund of knowledge is appropriate.  Recent and remote memory are intact. 3/3 delayed recall. Attention and concentration are normal.    Able to name objects and repeat phrases. Cranial nerves: CN I: not tested CN II: pupils equal, round and reactive to light, visual fields intact, fundi unremarkable. CN III, IV, VI:  full range of motion, no nystagmus, no ptosis CN V: facial sensation intact CN VII: upper and lower face symmetric CN VIII: hearing intact to finger rub CN IX, X: gag intact, uvula midline CN XI: sternocleidomastoid and trapezius muscles intact CN XII: tongue midline Bulk & Tone: normal, no fasciculations. Motor: 5/5 throughout with no pronator drift. Sensation: intact to light touch, cold, pin, vibration and joint position sense.  No extinction to double simultaneous stimulation.  Romberg test negative Deep Tendon Reflexes: +2 throughout, no ankle  clonus Plantar responses: downgoing bilaterally Cerebellar: no incoordination on finger to nose, heel to shin. No dysdiadochokinesia Gait: narrow-based and steady, able to tandem walk adequately. Tremor: none  IMPRESSION: This is a 48 year old right-handed man with a history of pancreatitis, subdural hematoma s/p evacuation in 2017 who had focal seizures with impaired awareness a few days after hospital discharge in 2017. He denies any further seizures since 2017, he has been taking Keppra 500mg  BID without side effects. We discussed that we can potentially start weaning off medication since he has been seizure-free for  more than 3 years, would repeat brain imaging and EEG, and if normal, we can start weaning medication. We discussed risks of breakthrough seizure with any medication adjustment, as well as driving restrictions. He does not want to wean off medication at this time. He will continue follow-up with PCP or Ortho for back pain. [place] driving laws were discussed with the patient, and he knows to stop driving after a seizure, until 6 months seizure-free. Follow-up in 1 year, he knows to call for any changes.   Thank you for allowing me to participate in the care of this patient. Please do not hesitate to call for any questions or concerns.   Ellouise Newer, M.D.  CC: Robert Amel, PA-C

## 2018-05-01 NOTE — Patient Instructions (Signed)
1. Continue Keppra 500mg twice a day 2. Follow-up in 1 year, call for any changes  Seizure Precautions: 1. If medication has been prescribed for you to prevent seizures, take it exactly as directed.  Do not stop taking the medicine without talking to your doctor first, even if you have not had a seizure in a long time.   2. Avoid activities in which a seizure would cause danger to yourself or to others.  Don't operate dangerous machinery, swim alone, or climb in high or dangerous places, such as on ladders, roofs, or girders.  Do not drive unless your doctor says you may.  3. If you have any warning that you may have a seizure, lay down in a safe place where you can't hurt yourself.    4.  No driving for 6 months from last seizure, as per [place] state law.   Please refer to the following link on the Epilepsy Foundation of America's website for more information: http://www.epilepsyfoundation.org/answerplace/Social/driving/drivingu.cfm   5.  Maintain good sleep hygiene. Avoid alcohol.  6.  Contact your doctor if you have any problems that may be related to the medicine you are taking.  7.  Call 911 and bring the patient back to the ED if:        A.  The seizure lasts longer than 5 minutes.       B.  The patient doesn't awaken shortly after the seizure  C.  The patient has new problems such as difficulty seeing, speaking or moving  D.  The patient was injured during the seizure  E.  The patient has a temperature over 102 F (39C)  F.  The patient vomited and now is having trouble breathing         

## 2018-05-02 ENCOUNTER — Encounter: Payer: Self-pay | Admitting: Neurology

## 2018-05-08 DIAGNOSIS — R3915 Urgency of urination: Secondary | ICD-10-CM | POA: Diagnosis not present

## 2018-05-08 DIAGNOSIS — R351 Nocturia: Secondary | ICD-10-CM | POA: Diagnosis not present

## 2018-05-08 DIAGNOSIS — N401 Enlarged prostate with lower urinary tract symptoms: Secondary | ICD-10-CM | POA: Diagnosis not present

## 2018-07-01 ENCOUNTER — Ambulatory Visit: Payer: Medicare HMO | Admitting: Internal Medicine

## 2018-07-03 ENCOUNTER — Ambulatory Visit (INDEPENDENT_AMBULATORY_CARE_PROVIDER_SITE_OTHER): Payer: Medicare HMO | Admitting: Nurse Practitioner

## 2018-07-03 ENCOUNTER — Encounter: Payer: Self-pay | Admitting: Nurse Practitioner

## 2018-07-03 ENCOUNTER — Other Ambulatory Visit: Payer: Self-pay

## 2018-07-03 VITALS — BP 102/80 | HR 75 | Temp 98.2°F | Ht 65.0 in | Wt 177.2 lb

## 2018-07-03 DIAGNOSIS — M79645 Pain in left finger(s): Secondary | ICD-10-CM | POA: Diagnosis not present

## 2018-07-03 NOTE — Progress Notes (Signed)
Subjective:     Patient ID: [name] , male    DOB: August 20, 1970 , 48 y.o.   MRN: [id number]   Chief Complaint  Patient presents with  . Hand Pain     HPI  Hand Pain   The incident occurred 5 to 7 days ago. The injury mechanism was a direct blow (unknown object). The quality of the pain is described as aching. Pertinent negatives include no chest pain.     Past Medical History:  Diagnosis Date  . Abdominal pain    intermittent  . Anemia   . Aneurysm ([place])   . Asthma   . GERD (gastroesophageal reflux disease)   . Pancreatitis   . PTSD (post-traumatic stress disorder)    controlled  . PTSD (post-traumatic stress disorder)   . PTSD (post-traumatic stress disorder) 2008  . Rectal polyp 01/09/2006   Hyperplastic  . Seizures ([place])      Family History  Problem Relation Age of Onset  . Stroke Mother   . Hypertension Mother   . Diabetes Father   . Pancreatic cancer Maternal Uncle   . Cancer Other   . Diabetes Other      Current Outpatient Medications:  .  dicyclomine (BENTYL) 20 MG tablet, Take 1 tablet (20 mg total) by mouth 4 (four) times daily -  before meals and at bedtime., Disp: 90 tablet, Rfl: 1 .  folic acid (FOLVITE) 1 MG tablet, TAKE 1 TABLET BY MOUTH EVERY DAY, Disp: 90 tablet, Rfl: 1 .  levETIRAcetam (KEPPRA) 500 MG tablet, Take 1 tablet (500 mg total) by mouth 2 (two) times daily., Disp: 180 tablet, Rfl: 3 .  lipase/protease/amylase (CREON) 36000 UNITS CPEP capsule, Take 72,000-108,000 Units by mouth 5 (five) times daily. Pt takes three capsules three times daily with meals and two capsules two times daily with snacks., Disp: , Rfl:  .  loratadine (CLARITIN) 10 MG tablet, Take 1 tablet (10 mg total) by mouth daily., Disp: 90 tablet, Rfl: 1 .  methocarbamol (ROBAXIN) 500 MG tablet, Take 1 tablet (500 mg total) by mouth at bedtime as needed for muscle spasms., Disp: 12 tablet, Rfl: 0 .  metoCLOPramide (REGLAN) 10 MG tablet, TAKE 1 TO 2 TABLETS BY MOUTH AT  BEDTIME, Disp: 60 tablet, Rfl: 0 .  Multiple Vitamin (MULTIVITAMIN WITH MINERALS) TABS tablet, Take 1 tablet by mouth daily., Disp: , Rfl:  .  omeprazole (PRILOSEC) 40 MG capsule, Take 1 capsule (40 mg total) by mouth daily., Disp: 90 capsule, Rfl: 1 .  ondansetron (ZOFRAN) 4 MG tablet, Take 1 tablet (4 mg total) by mouth every 6 (six) hours., Disp: 12 tablet, Rfl: 0 .  tetrahydrozoline-zinc (VISINE-AC) 0.05-0.25 % ophthalmic solution, Place 2 drops into both eyes 3 (three) times daily as needed (Itching)., Disp: , Rfl:  .  thiamine (VITAMIN B-1) 100 MG tablet, Take 1 tablet (100 mg total) by mouth daily., Disp: 90 tablet, Rfl: 1   Allergies  Allergen Reactions  . Aspirin Anaphylaxis  . Ibuprofen Anaphylaxis  . Hydrocodone Hives and Itching  . Tylenol [Acetaminophen]     Upset stomach     Review of Systems  Constitutional: Negative.   Respiratory: Negative.   Cardiovascular: Negative.  Negative for chest pain, palpitations and leg swelling.  Musculoskeletal:       Pain to left hand 2nd finger  Neurological: Negative for dizziness and headaches.     Today's Vitals   07/03/18 1058  BP: 102/80  Pulse: 75  Temp: 98.2  F (36.8 C)  TempSrc: Oral  Weight: 177 lb 3.2 oz (80.4 kg)  Height: 5\' 5"  (1.651 m)  PainSc: 10-Worst pain ever  PainLoc: Finger   Body mass index is 29.49 kg/m.   Objective:  Physical Exam Vitals signs reviewed.  Constitutional:      Appearance: Normal appearance.  Cardiovascular:     Rate and Rhythm: Normal rate and regular rhythm.     Pulses: Normal pulses.     Heart sounds: Normal heart sounds. No murmur.  Pulmonary:     Effort: Pulmonary effort is normal.     Breath sounds: Normal breath sounds.  Musculoskeletal:        General: Tenderness (left 2nd finger 1st phalange is swollen and nail is slightly lifted) present.  Neurological:     Mental Status: He is alert.         Assessment And Plan:     1. Pain of finger of left hand  Able to  move finger  Swollen to the 2nd finger with slight lifting of nailbed  Advised to soak finger   If not better return call to office       [name], FNP    THE PATIENT IS ENCOURAGED TO PRACTICE SOCIAL DISTANCING DUE TO THE COVID-19 PANDEMIC.

## 2018-07-11 ENCOUNTER — Encounter: Payer: Self-pay | Admitting: Nurse Practitioner

## 2018-10-16 ENCOUNTER — Encounter: Payer: Self-pay | Admitting: Gastroenterology

## 2018-10-16 ENCOUNTER — Other Ambulatory Visit: Payer: Self-pay

## 2018-10-16 DIAGNOSIS — Z1211 Encounter for screening for malignant neoplasm of colon: Secondary | ICD-10-CM

## 2018-11-10 ENCOUNTER — Other Ambulatory Visit: Payer: Self-pay

## 2018-11-10 ENCOUNTER — Ambulatory Visit (AMBULATORY_SURGERY_CENTER): Payer: Self-pay | Admitting: *Deleted

## 2018-11-10 ENCOUNTER — Encounter: Payer: Self-pay | Admitting: Gastroenterology

## 2018-11-10 VITALS — Temp 96.9°F | Ht 74.0 in | Wt 169.6 lb

## 2018-11-10 DIAGNOSIS — Z1211 Encounter for screening for malignant neoplasm of colon: Secondary | ICD-10-CM

## 2018-11-10 MED ORDER — PEG 3350-KCL-NA BICARB-NACL 420 G PO SOLR: 4000.0000 mL | Freq: Once | ORAL | 0 refills | Status: AC

## 2018-11-10 NOTE — Progress Notes (Signed)

## 2018-11-24 ENCOUNTER — Encounter: Payer: Self-pay | Admitting: Gastroenterology

## 2018-11-24 ENCOUNTER — Ambulatory Visit (AMBULATORY_SURGERY_CENTER): Payer: Medicare HMO | Admitting: Gastroenterology

## 2018-11-24 ENCOUNTER — Other Ambulatory Visit: Payer: Self-pay

## 2018-11-24 VITALS — BP 130/76 | HR 50 | Temp 98.7°F | Resp 7 | Ht 74.0 in | Wt 169.6 lb

## 2018-11-24 DIAGNOSIS — D128 Benign neoplasm of rectum: Secondary | ICD-10-CM | POA: Diagnosis not present

## 2018-11-24 DIAGNOSIS — D124 Benign neoplasm of descending colon: Secondary | ICD-10-CM

## 2018-11-24 DIAGNOSIS — D127 Benign neoplasm of rectosigmoid junction: Secondary | ICD-10-CM | POA: Diagnosis not present

## 2018-11-24 DIAGNOSIS — Z1211 Encounter for screening for malignant neoplasm of colon: Secondary | ICD-10-CM | POA: Diagnosis not present

## 2018-11-24 DIAGNOSIS — D125 Benign neoplasm of sigmoid colon: Secondary | ICD-10-CM

## 2018-11-24 MED ORDER — SODIUM CHLORIDE 0.9 % IV SOLN: 500.0000 mL | Freq: Once | INTRAVENOUS | Status: DC

## 2018-11-24 NOTE — Op Note (Signed)
[facility] Patient Name: [name] Procedure Date: 11/24/2018 9:15 AM MRN: [id number] Endoscopist: [name] , MD Age: 48 Referring MD:  Date of Birth: [birth year] Gender: Male Account #: [id number] Procedure:                Colonoscopy Indications:              Screening for colorectal malignant neoplasm Medicines:                Monitored Anesthesia Care Procedure:                Pre-Anesthesia Assessment:                           - Prior to the procedure, a History and Physical                            was performed, and patient medications and                            allergies were reviewed. The patient's tolerance of                            previous anesthesia was also reviewed. The risks                            and benefits of the procedure and the sedation                            options and risks were discussed with the patient.                            All questions were answered, and informed consent                            was obtained. Prior Anticoagulants: The patient has                            taken no previous anticoagulant or antiplatelet                            agents. ASA Grade Assessment: II - A patient with                            mild systemic disease. After reviewing the risks                            and benefits, the patient was deemed in                            satisfactory condition to undergo the procedure.                           After obtaining informed consent, the colonoscope  was passed under direct vision. Throughout the                            procedure, the patient's blood pressure, pulse, and                            oxygen saturations were monitored continuously. The                            Colonoscope was introduced through the anus and                            advanced to the the cecum, identified by                            appendiceal orifice and  ileocecal valve. The                            colonoscopy was performed without difficulty. The                            patient tolerated the procedure well. The quality                            of the bowel preparation was good. The ileocecal                            valve, appendiceal orifice, and rectum were                            photographed. Scope In: 9:18:23 AM Scope Out: 9:37:45 AM Scope Withdrawal Time: 0 hours 10 minutes 48 seconds  Total Procedure Duration: 0 hours 19 minutes 22 seconds  Findings:                 Three sessile polyps were found in the rectum,                            sigmoid colon and descending colon. The polyps were                            3 to 5 mm in size. These polyps were removed with a                            cold snare. Resection and retrieval were complete.                           The exam was otherwise without abnormality on                            direct and retroflexion views. Complications:            No immediate complications. Estimated blood loss:  None. Estimated Blood Loss:     Estimated blood loss: none. Impression:               - Three 3 to 5 mm polyps in the rectum, in the                            sigmoid colon and in the descending colon, removed                            with a cold snare. Resected and retrieved.                           - The examination was otherwise normal on direct                            and retroflexion views. Recommendation:           - Patient has a contact number available for                            emergencies. The signs and symptoms of potential                            delayed complications were discussed with the                            patient. Return to normal activities tomorrow.                            Written discharge instructions were provided to the                            patient.                           - Resume previous  diet.                           - Continue present medications.                           - Await pathology results. [name], MD 11/24/2018 9:39:32 AM This report has been signed electronically.

## 2018-11-24 NOTE — Progress Notes (Signed)
PT taken to PACU. Monitors in place. VSS. Report given to RN. 

## 2018-11-24 NOTE — Patient Instructions (Signed)
Read all of the handouts given to you by your recovery room nurse.  Thank-you for choosing us for your healthcare needs today.  YOU HAD AN ENDOSCOPIC PROCEDURE TODAY AT THE [facility]:   Refer to the procedure report that was given to you for any specific questions about what was found during the examination.  If the procedure report does not answer your questions, please call your gastroenterologist to clarify.  If you requested that your care partner not be given the details of your procedure findings, then the procedure report has been included in a sealed envelope for you to review at your convenience later.  YOU SHOULD EXPECT: Some feelings of bloating in the abdomen. Passage of more gas than usual.  Walking can help get rid of the air that was put into your GI tract during the procedure and reduce the bloating. If you had a lower endoscopy (such as a colonoscopy or flexible sigmoidoscopy) you may notice spotting of blood in your stool or on the toilet paper. If you underwent a bowel prep for your procedure, you may not have a normal bowel movement for a few days.  Please Note:  You might notice some irritation and congestion in your nose or some drainage.  This is from the oxygen used during your procedure.  There is no need for concern and it should clear up in a day or so.  SYMPTOMS TO REPORT IMMEDIATELY:   Following lower endoscopy (colonoscopy or flexible sigmoidoscopy):  Excessive amounts of blood in the stool  Significant tenderness or worsening of abdominal pains  Swelling of the abdomen that is new, acute  Fever of 100F or higher   For urgent or emergent issues, a gastroenterologist can be reached at any hour by calling [phone].   DIET:  We do recommend a small meal at first, but then you may proceed to your regular diet.  Drink plenty of fluids but you should avoid alcoholic beverages for 24 hours.  ACTIVITY:  You should plan to take it easy for the rest of  today and you should NOT DRIVE or use heavy machinery until tomorrow (because of the sedation medicines used during the test).    FOLLOW UP: Our staff will call the number listed on your records 48-72 hours following your procedure to check on you and address any questions or concerns that you may have regarding the information given to you following your procedure. If we do not reach you, we will leave a message.  We will attempt to reach you two times.  During this call, we will ask if you have developed any symptoms of COVID 19. If you develop any symptoms (ie: fever, flu-like symptoms, shortness of breath, cough etc.) before then, please call [phone].  If you test positive for Covid 19 in the 2 weeks post procedure, please call and report this information to us.    If any biopsies were taken you will be contacted by phone or by letter within the next 1-3 weeks.  Please call us at [phone] if you have not heard about the biopsies in 3 weeks.    SIGNATURES/CONFIDENTIALITY: You and/or your care partner have signed paperwork which will be entered into your electronic medical record.  These signatures attest to the fact that that the information above on your After Visit Summary has been reviewed and is understood.  Full responsibility of the confidentiality of this discharge information lies with you and/or your care-partner. 

## 2018-11-24 NOTE — Progress Notes (Signed)
Pt's states no medical or surgical changes since previsit or office visit.  Vs and temp by Loel Ro   covid screen and temp by J[facility] front desk

## 2018-11-26 ENCOUNTER — Telehealth: Payer: Self-pay

## 2018-11-26 NOTE — Telephone Encounter (Signed)
  Follow up Call-  Call back number 11/24/2018  Post procedure Call Back phone  # [phone]  Permission to leave phone message Yes  Some recent data might be hidden     Patient questions:  Do you have a fever, pain , or abdominal swelling? No. Pain Score  0 *  Have you tolerated food without any problems? Yes.    Have you been able to return to your normal activities? Yes.    Do you have any questions about your discharge instructions: Diet   No. Medications  No. Follow up visit  No.  Do you have questions or concerns about your Care? No.  Actions: * If pain score is 4 or above: No action needed, pain <4.  Have you developed a fever since your procedure? No 2.   Have you had an respiratory symptoms (SOB or cough) since your procedure? No  3.   Have you tested positive for COVID 19 since your procedure No  4.   Have you had any family members/close contacts diagnosed with the COVID 19 since your procedure?  No   If yes to any of these questions please route to Joylene John, RN and Alphonsa Gin, RN.

## 2018-11-28 ENCOUNTER — Telehealth: Payer: Self-pay | Admitting: Gastroenterology

## 2018-11-28 ENCOUNTER — Encounter: Payer: Self-pay | Admitting: Gastroenterology

## 2018-11-28 NOTE — Telephone Encounter (Signed)
Pt had colon 11/24/18 and reported that he is experiencing abd and rectal pain.

## 2018-11-28 NOTE — Telephone Encounter (Signed)
Pt has been experiencing sharp lower abd pain since colon on 10/12.  He has had chronic abd pain in the past.  He says he has a lot of gas and the pain seems to ease after he passes gas.  He says it is worse when eating as well. He states he is having some bowel movements but not as much as prior to colon.  I advised the pt to try gas ex prior to meals and call back if his symptoms do not resolve and to call over the weekend if the pain persist or worsens.

## 2018-11-30 NOTE — Telephone Encounter (Signed)
[name], thanks.  Can you check on him tomorrow morning (Monday)?

## 2018-12-01 NOTE — Telephone Encounter (Signed)
Left message on machine to call back  

## 2018-12-02 NOTE — Telephone Encounter (Signed)
The pt returned my call and tells me the only problem he is having now is pain in the right leg and lower right side of his back.  He will call PCP for evaluation and Dr [name] will be notified.

## 2018-12-02 NOTE — Telephone Encounter (Signed)
FYI:  Dr [name] I called the pt yesterday and today and left messages to return my call with no response.

## 2019-01-04 ENCOUNTER — Other Ambulatory Visit: Payer: Self-pay | Admitting: Internal Medicine

## 2019-01-04 DIAGNOSIS — K5901 Slow transit constipation: Secondary | ICD-10-CM

## 2019-01-04 DIAGNOSIS — R1010 Upper abdominal pain, unspecified: Secondary | ICD-10-CM

## 2019-01-15 ENCOUNTER — Ambulatory Visit: Payer: Medicare HMO | Admitting: Internal Medicine

## 2019-01-22 ENCOUNTER — Ambulatory Visit: Payer: Medicare HMO | Admitting: Internal Medicine

## 2019-01-29 ENCOUNTER — Ambulatory Visit (INDEPENDENT_AMBULATORY_CARE_PROVIDER_SITE_OTHER): Payer: Medicare HMO | Admitting: Internal Medicine

## 2019-01-29 ENCOUNTER — Other Ambulatory Visit: Payer: Self-pay

## 2019-01-29 ENCOUNTER — Encounter: Payer: Self-pay | Admitting: Internal Medicine

## 2019-01-29 VITALS — BP 118/76 | HR 94 | Temp 98.1°F | Ht 73.6 in | Wt 175.8 lb

## 2019-01-29 DIAGNOSIS — J3089 Other allergic rhinitis: Secondary | ICD-10-CM | POA: Diagnosis not present

## 2019-01-29 DIAGNOSIS — Z79899 Other long term (current) drug therapy: Secondary | ICD-10-CM

## 2019-01-29 DIAGNOSIS — Z860100 Personal history of colon polyps, unspecified: Secondary | ICD-10-CM | POA: Insufficient documentation

## 2019-01-29 DIAGNOSIS — Z760 Encounter for issue of repeat prescription: Secondary | ICD-10-CM | POA: Diagnosis not present

## 2019-01-29 DIAGNOSIS — Z712 Person consulting for explanation of examination or test findings: Secondary | ICD-10-CM

## 2019-01-29 DIAGNOSIS — R1010 Upper abdominal pain, unspecified: Secondary | ICD-10-CM

## 2019-01-29 MED ORDER — LORATADINE 10 MG PO TABS: 10.0000 mg | ORAL_TABLET | Freq: Every day | ORAL | 5 refills | Status: DC

## 2019-01-29 NOTE — Progress Notes (Signed)
This visit occurred during the SARS-CoV-2 public health emergency.  Safety protocols were in place, including screening questions prior to the visit, additional usage of staff PPE, and extensive cleaning of exam room while observing appropriate contact time as indicated for disinfecting solutions.  Subjective:     Patient ID: [name] , male    DOB: 01/22/1971 , 48 y.o.   MRN: [id number]   Chief Complaint  Patient presents with  . Medication Refill    HPI 1- Needs Claritin refilled.  2- Has seen a neurologist and was placed back on the Kepra. He was asked to get EEG, but he never went back to them. They have continued refilling his Kepra.   3- Had GI visit and was placed on Prilosec bid. Sometimes gets sharp pains on epigastric pain which last about 20 min and bends him over  And later  resolves, worse qhs. Since on this med, is not as often, but it can get bad. No blood in stools or black stools noted.   Past Medical History:  Diagnosis Date  . Abdominal pain    intermittent  . Allergy    SEASONAL  . Anemia   . Aneurysm (HCC)   . Asthma   . GERD (gastroesophageal reflux disease)   . Pancreatitis   . PTSD (post-traumatic stress disorder)    controlled  . PTSD (post-traumatic stress disorder)   . PTSD (post-traumatic stress disorder) 2008  . Rectal polyp 01/09/2006   Hyperplastic  . Seizures (HCC)    ADMITS TAKING MEDICATION FOR SEIZURE D/T ANEURYSM ,DO NOT KNOW IF HAD SEIZURE. 11/10/18     Family History  Problem Relation Age of Onset  . Stroke Mother   . Hypertension Mother   . Diabetes Father   . Pancreatic cancer Maternal Uncle   . Colon cancer Maternal Uncle   . Cancer Other   . Diabetes Other   . Colon cancer Maternal Grandfather   . Colon cancer Paternal Grandfather   . Pancreatic cancer Cousin   . Esophageal cancer Neg Hx      Current Outpatient Medications:  .  dicyclomine (BENTYL) 20 MG tablet, Take 1 tablet (20 mg total) by mouth 4 (four)  times daily -  before meals and at bedtime., Disp: 90 tablet, Rfl: 1 .  docusate sodium (COLACE) 100 MG capsule, TAKE 1 CAPSULE BY MOUTH TWICE A DAY, Disp: 60 capsule, Rfl: 2 .  folic acid (FOLVITE) 1 MG tablet, TAKE 1 TABLET BY MOUTH EVERY DAY, Disp: 90 tablet, Rfl: 1 .  levETIRAcetam (KEPPRA) 500 MG tablet, Take 1 tablet (500 mg total) by mouth 2 (two) times daily., Disp: 180 tablet, Rfl: 3 .  lipase/protease/amylase (CREON) 36000 UNITS CPEP capsule, Take 72,000-108,000 Units by mouth 5 (five) times daily. Pt takes three capsules three times daily with meals and two capsules two times daily with snacks., Disp: , Rfl:  .  methocarbamol (ROBAXIN) 500 MG tablet, Take 1 tablet (500 mg total) by mouth at bedtime as needed for muscle spasms., Disp: 12 tablet, Rfl: 0 .  metoCLOPramide (REGLAN) 10 MG tablet, TAKE 1 TO 2 TABLETS BY MOUTH EVERY DAY AT BEDTIME, Disp: 60 tablet, Rfl: 0 .  Multiple Vitamin (MULTIVITAMIN WITH MINERALS) TABS tablet, Take 1 tablet by mouth daily., Disp: , Rfl:  .  omeprazole (PRILOSEC) 40 MG capsule, TAKE 1 CAPSULE BY MOUTH EVERY DAY, Disp: 90 capsule, Rfl: 1 .  ondansetron (ZOFRAN) 4 MG tablet, Take 1 tablet (4 mg total) by mouth  every 6 (six) hours., Disp: 12 tablet, Rfl: 0 .  oxybutynin (DITROPAN) 5 MG tablet, , Disp: , Rfl:  .  tetrahydrozoline-zinc (VISINE-AC) 0.05-0.25 % ophthalmic solution, Place 2 drops into both eyes 3 (three) times daily as needed (Itching)., Disp: , Rfl:  .  thiamine (VITAMIN B-1) 100 MG tablet, TAKE 1 TABLET BY MOUTH EVERY DAY, Disp: 100 tablet, Rfl: 1 .  loratadine (CLARITIN) 10 MG tablet, Take 1 tablet (10 mg total) by mouth daily., Disp: 30 tablet, Rfl: 5   Allergies  Allergen Reactions  . Aspirin Anaphylaxis  . Ibuprofen Anaphylaxis  . Hydrocodone Hives and Itching  . Tylenol [Acetaminophen]     Upset stomach     Review of Systems  Off and on sneezing and rhinitis. + abdominal pain, no diarrhea, constipation or vomiting. No blood in stool.  Denies any seizures  Today's Vitals   01/29/19 1414  BP: 118/76  Pulse: 94  Temp: 98.1 F (36.7 C)  TempSrc: Oral  Weight: 175 lb 12.8 oz (79.7 kg)  Height: 6' 1.6" (1.869 m)   Body mass index is 22.82 kg/m.   Objective:  Physical Exam   Constitutional: he is oriented to person, place, and time. he appears well-developed and well-nourished. No distress.  HENT:  Head: Normocephalic and atraumatic.  Right Ear: External ear normal.  Left Ear: External ear normal.  Nose: Nose normal.  Eyes: Conjunctivae are normal. Right eye exhibits no discharge. Left eye exhibits no discharge. No scleral icterus.  Neck: Neck supple. No thyromegaly present.  No carotid bruits bilaterally  Cardiovascular: Normal rate and regular rhythm.  No murmur heard. Pulmonary/Chest: Effort normal and breath sounds normal. No respiratory distress.  Musculoskeletal: Normal range of motion. Lymphadenopathy:  has no cervical adenopathy.  Neurological: he is alert and oriented to person, place, and time.  Skin: Skin is warm and dry. Capillary refill takes less than 2 seconds. No rash noted. he is not diaphoretic.  Psychiatric: he has a normal mood and affect. Hid behavior is normal. Judgment and thought content normal.  Nursing note reviewed.    Assessment And Plan:    1. Long-term use of high-risk medication- chronic - CMP14 + Anion Gap - CBC with Diff  2. Pain of upper abdomen-unresolved.  - Ambulatory referral to Gastroenterology  3. Allergic rhinitis due to other allergic trigger, unspecified seasonality- chronic and stable on Claritin   Claritin was refilled.  4- Hx of colon polyps I reviewed his colonoscopy with him since he did not know the results.  FU next month for a physical.     Sofhia Ulibarri RODRIGUEZ-SOUTHWORTH, PA-C    THE PATIENT IS ENCOURAGED TO PRACTICE SOCIAL DISTANCING DUE TO THE COVID-19 PANDEMIC.

## 2019-01-30 LAB — CMP14 + ANION GAP
ALT: 16 IU/L (ref 0–44)
AST: 24 IU/L (ref 0–40)
Albumin/Globulin Ratio: 1.6 (ref 1.2–2.2)
Albumin: 4.1 g/dL (ref 4.0–5.0)
Alkaline Phosphatase: 54 IU/L (ref 39–117)
Anion Gap: 12 mmol/L (ref 10.0–18.0)
BUN/Creatinine Ratio: 8 — ABNORMAL LOW (ref 9–20)
BUN: 8 mg/dL (ref 6–24)
Bilirubin Total: 0.4 mg/dL (ref 0.0–1.2)
CO2: 25 mmol/L (ref 20–29)
Calcium: 8.9 mg/dL (ref 8.7–10.2)
Chloride: 97 mmol/L (ref 96–106)
Creatinine, Ser: 1.01 mg/dL (ref 0.76–1.27)
GFR calc Af Amer: 100 mL/min/{1.73_m2} (ref >59)
GFR calc non Af Amer: 86 mL/min/{1.73_m2} (ref >59)
Globulin, Total: 2.6 g/dL (ref 1.5–4.5)
Glucose: 74 mg/dL (ref 65–99)
Potassium: 4.3 mmol/L (ref 3.5–5.2)
Sodium: 134 mmol/L (ref 134–144)
Total Protein: 6.7 g/dL (ref 6.0–8.5)

## 2019-01-30 LAB — CBC WITH DIFFERENTIAL/PLATELET
Basophils Absolute: 0 10*3/uL (ref 0.0–0.2)
Basos: 1 %
EOS (ABSOLUTE): 0 10*3/uL (ref 0.0–0.4)
Eos: 0 %
Hematocrit: 39.6 % (ref 37.5–51.0)
Hemoglobin: 13.5 g/dL (ref 13.0–17.7)
Immature Grans (Abs): 0 10*3/uL (ref 0.0–0.1)
Immature Granulocytes: 0 %
Lymphocytes Absolute: 2 10*3/uL (ref 0.7–3.1)
Lymphs: 57 %
MCH: 31.2 pg (ref 26.6–33.0)
MCHC: 34.1 g/dL (ref 31.5–35.7)
MCV: 92 fL (ref 79–97)
Monocytes Absolute: 0.4 10*3/uL (ref 0.1–0.9)
Monocytes: 11 %
Neutrophils Absolute: 1.1 10*3/uL — ABNORMAL LOW (ref 1.4–7.0)
Neutrophils: 31 %
Platelets: 185 10*3/uL (ref 150–450)
RBC: 4.33 x10E6/uL (ref 4.14–5.80)
RDW: 12.9 % (ref 11.6–15.4)
WBC: 3.4 10*3/uL (ref 3.4–10.8)

## 2019-02-02 ENCOUNTER — Telehealth: Payer: Self-pay

## 2019-02-02 NOTE — Telephone Encounter (Signed)
Spoke with pt   Rodriguez-Southworth, Sunday Spillers, PA-C  Candiss Norse T, CMA  Please inform pt that his labs are normal.

## 2019-02-15 ENCOUNTER — Other Ambulatory Visit: Payer: Self-pay | Admitting: Internal Medicine

## 2019-02-15 DIAGNOSIS — R1010 Upper abdominal pain, unspecified: Secondary | ICD-10-CM

## 2019-02-24 ENCOUNTER — Ambulatory Visit: Payer: Medicare HMO | Admitting: Physician Assistant

## 2019-02-24 ENCOUNTER — Encounter: Payer: Self-pay | Admitting: Physician Assistant

## 2019-02-24 VITALS — BP 92/70 | HR 76 | Temp 98.7°F | Ht 73.0 in | Wt 170.0 lb

## 2019-02-24 DIAGNOSIS — Z01818 Encounter for other preprocedural examination: Secondary | ICD-10-CM | POA: Diagnosis not present

## 2019-02-24 DIAGNOSIS — R1013 Epigastric pain: Secondary | ICD-10-CM | POA: Diagnosis not present

## 2019-02-24 DIAGNOSIS — Z8711 Personal history of peptic ulcer disease: Secondary | ICD-10-CM

## 2019-02-24 DIAGNOSIS — K219 Gastro-esophageal reflux disease without esophagitis: Secondary | ICD-10-CM

## 2019-02-24 DIAGNOSIS — Z8719 Personal history of other diseases of the digestive system: Secondary | ICD-10-CM

## 2019-02-24 MED ORDER — FAMOTIDINE 40 MG PO TABS: 40.0000 mg | ORAL_TABLET | Freq: Two times a day (BID) | ORAL | 5 refills | Status: DC

## 2019-02-24 MED ORDER — PANTOPRAZOLE SODIUM 40 MG PO TBEC: 40.0000 mg | DELAYED_RELEASE_TABLET | Freq: Two times a day (BID) | ORAL | 5 refills | Status: DC

## 2019-02-24 NOTE — H&P (View-Only) (Signed)
Chief Complaint: Pain of upper abdomen  HPI:    [name] is a 49 year old African-American male with a past medical history as listed below including reflux, seizures and PTSD, known to Dr. [name], who was referred to me by Rodriguez-Southworth, S* for a complaint of upper abdominal pain.     05/06/2015 EGD to follow-up after ulcers with findings of a normal esophagus, diffuse mild inflammation characterized by erythema in the antrum, previously noted gastric ulcer was healed.    02/27/2016 right upper quadrant ultrasound with no sonographic evidence for acute cholecystitis or biliary dilatation, slight increased hepatic echogenicity consistent with fatty infiltration.    11/24/2018 screening colonoscopy with three 3-5 mm polyps in the rectum, sigmoid colon and descending colon, exam otherwise normal.  Pathology with tubular adenomas.  Repeat recommended in 3 years.    01/29/2019 CBC and CMP normal.    01/29/2019 saw PCP and described some sharp pains in his epigastrium which can last 20 minutes and been him over.  They are worse at night.      Today, the patient tells me that he has had trouble with reflux for years, but it was "somewhat okay" until the past 5 to 6 months.  He has been using Omeprazole 40 mg twice daily.  Explains that now anytime he eats anything he will have some epigastric pain described as a sharp pain for at least a few minutes and then it eases off.  Rated as a 4-5/10. This all is worse when he lays down to sleep at night.  He tells me he will be awakened at sometimes 2 or 3 in the morning with reflux and acid in his mouth which makes him nauseous.  Does explain he had a bad episode yesterday but ate at least 10 or more Oreos right before laying down to sleep.  Denies eating a lot of fatty greasy foods or drinking a lot of alcohol or caffeine.    Denies fever, chills, weight loss, change in bowel habits or melena.     Past Medical History:  Diagnosis Date  . Abdominal pain     intermittent  . Allergy    SEASONAL  . Anemia   . Aneurysm ([place])   . Asthma   . GERD (gastroesophageal reflux disease)   . Pancreatitis   . PTSD (post-traumatic stress disorder)    controlled  . PTSD (post-traumatic stress disorder)   . PTSD (post-traumatic stress disorder) 2008  . Rectal polyp 01/09/2006   Hyperplastic  . Seizures ([place])    ADMITS TAKING MEDICATION FOR SEIZURE D/T ANEURYSM ,DO NOT KNOW IF HAD SEIZURE. 11/10/18    Past Surgical History:  Procedure Laterality Date  . arm surgery Left    repair of tendons "work injury cut"-"pinky finger numb"  . BURR HOLE Right 02/28/2015   Procedure: BURR HOLES FOR SUBDURAL HEMATOMA;  Surgeon: Newman Pies, MD;  Location: [facility];  Service: Neurosurgery;  Laterality: Right;  . CEREBRAL ANEURYSM REPAIR    . COLONOSCOPY  2016   colon polyps  . ESOPHAGOGASTRODUODENOSCOPY (EGD) WITH PROPOFOL N/A 01/13/2015   Procedure: ESOPHAGOGASTRODUODENOSCOPY (EGD) WITH PROPOFOL;  Surgeon: [name], MD;  Location: [facility];  Service: Endoscopy;  Laterality: N/A;  . ESOPHAGOGASTRODUODENOSCOPY ENDOSCOPY    . HERNIA REPAIR     Umbilical  . laceration of left arm    . 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  dicyclomine (BENTYL) 20 MG tablet Take 1 tablet (20 mg total) by mouth 4 (four) times daily -  before meals and at bedtime. 90 tablet 1  . docusate sodium (COLACE) 100 MG capsule TAKE 1 CAPSULE BY MOUTH TWICE A DAY 60 capsule 2  . folic acid (FOLVITE) 1 MG tablet TAKE 1 TABLET BY MOUTH EVERY DAY 90 tablet 1  . levETIRAcetam (KEPPRA) 500 MG tablet Take 1 tablet (500 mg total) by mouth 2 (two) times daily. 180 tablet 3  . lipase/protease/amylase (CREON) 36000 UNITS CPEP capsule Take 72,000-108,000 Units by mouth 5 (five) times daily. Pt takes three capsules three times daily with meals and two capsules two times daily with snacks.    . loratadine  (CLARITIN) 10 MG tablet Take 1 tablet (10 mg total) by mouth daily. 30 tablet 5  . methocarbamol (ROBAXIN) 500 MG tablet Take 1 tablet (500 mg total) by mouth at bedtime as needed for muscle spasms. 12 tablet 0  . metoCLOPramide (REGLAN) 10 MG tablet TAKE 1 TO 2 TABLETS BY MOUTH EVERY DAY AT BEDTIME 60 tablet 0  . Multiple Vitamin (MULTIVITAMIN WITH MINERALS) TABS tablet Take 1 tablet by mouth daily.    [name] omeprazole (PRILOSEC) 40 MG capsule TAKE 1 CAPSULE BY MOUTH EVERY DAY 90 capsule 1  . ondansetron (ZOFRAN) 4 MG tablet Take 1 tablet (4 mg total) by mouth every 6 (six) hours. 12 tablet 0  . oxybutynin (DITROPAN) 5 MG tablet     . tetrahydrozoline-zinc (VISINE-AC) 0.05-0.25 % ophthalmic solution Place 2 drops into both eyes 3 (three) times daily as needed (Itching).    . thiamine (VITAMIN B-1) 100 MG tablet TAKE 1 TABLET BY MOUTH EVERY DAY 100 tablet 1   No current facility-administered medications for this visit.    Allergies as of 02/24/2019 - Review Complete 01/29/2019  Allergen Reaction Noted  . Aspirin Anaphylaxis 01/12/2009  . Ibuprofen Anaphylaxis 01/12/2009  . Hydrocodone Hives and Itching 12/30/2014  . Tylenol [acetaminophen]  04/02/2018    Family History  Problem Relation Age of Onset  . Stroke Mother   . Hypertension Mother   . Diabetes Father   . Pancreatic cancer Maternal Uncle   . Colon cancer Maternal Uncle   . Cancer Other   . Diabetes Other   . Colon cancer Maternal Grandfather   . Colon cancer Paternal Grandfather   . Pancreatic cancer Cousin   . Esophageal cancer Neg Hx     Social History   Socioeconomic History  . Marital status: Single    Spouse name: Not on file  . Number of children: Not on file  . Years of education: Not on file  . Highest education level: Not on file  Occupational History  . Occupation: disable  Tobacco Use  . Smoking status: Current Every Day Smoker    Packs/day: 0.00    Types: Cigars  . Smokeless tobacco: Never Used  .  Tobacco comment: smokes a black and mild daily  Substance and Sexual Activity  . Alcohol use: No  . Drug use: Yes    Types: Marijuana    Comment: Quit-none in 1 month  . Sexual activity: Yes  Other Topics Concern  . 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 Difficulty of Paying Living Expenses: Not hard at all  Food Insecurity: No Food Insecurity  . Worried About Charity fundraiser in the Last Year: Never true  . Ran Out of Food in the Last Year: Never true  Transportation Needs: No Transportation Needs  . Lack of Transportation (Medical): No  . Lack of Transportation (Non-Medical): No  Physical Activity: Sufficiently Active  . Days of Exercise per Week: 7 days  . Minutes of Exercise per Session: 30 min  Stress:   . Feeling of Stress : Not on file  Social Connections:   . Frequency of Communication with Friends and Family: Not on file  . Frequency of Social Gatherings with Friends and Family: Not on file  . Attends Religious Services: Not on file  . Active Member of Clubs or Organizations: Not on file  . Attends Archivist Meetings: Not on file  . Marital Status: Not on file  Intimate Partner Violence: Not At Risk  . Fear of Current or Ex-Partner: No  . Emotionally Abused: No  . Physically Abused: No  . Sexually Abused: No    Review of Systems:    Constitutional: No weight loss, fever or chills Cardiovascular: No chest pain Respiratory: No SOB  Gastrointestinal: See HPI and otherwise negative   Physical Exam:  Vital signs: BP 92/70   Pulse 76   Temp 98.7 F (37.1 C)   Ht 6\' 1"  (1.854 m)   Wt 170 lb (77.1 kg)   BMI 22.43 kg/m   Constitutional:   Pleasant AA male appears to be in NAD, Well developed, Well nourished,  alert and cooperative Respiratory: Respirations even and unlabored. Lungs clear to auscultation bilaterally.   No wheezes, crackles, or rhonchi.  Cardiovascular: Normal S1, S2. No MRG. Regular rate and rhythm. No peripheral edema, cyanosis or pallor.  Gastrointestinal:  Soft, nondistended, moderate epigastric ttp with involuntary guarding. No rebound or guarding. Normal bowel sounds. No appreciable masses or hepatomegaly. Rectal:  Not performed.  Psychiatric:  Demonstrates good judgement and reason without abnormal affect or behaviors.  MOST RECENT LABS AND IMAGING: CBC    Component Value Date/Time   WBC 3.4 01/29/2019 1436   WBC 3.4 (L) 04/20/2017 1118   RBC 4.33 01/29/2019 1436   RBC 4.54 04/20/2017 1118   HGB 13.5 01/29/2019 1436   HCT 39.6 01/29/2019 1436   PLT 185 01/29/2019 1436   MCV 92 01/29/2019 1436   MCH 31.2 01/29/2019 1436   MCH 31.1 04/20/2017 1118   MCHC 34.1 01/29/2019 1436   MCHC 33.8 04/20/2017 1118   RDW 12.9 01/29/2019 1436   LYMPHSABS 2.0 01/29/2019 1436   MONOABS 0.5 03/05/2015 1957   EOSABS 0.0 01/29/2019 1436   BASOSABS 0.0 01/29/2019 1436    CMP     Component Value Date/Time   NA 134 01/29/2019 1436   K 4.3 01/29/2019 1436   CL 97 01/29/2019 1436   CO2 25 01/29/2019 1436   GLUCOSE 74 01/29/2019 1436   GLUCOSE 102 (H) 04/20/2017 1118   BUN 8 01/29/2019 1436   CREATININE 1.01 01/29/2019 1436   CALCIUM 8.9 01/29/2019 1436   PROT 6.7 01/29/2019 1436   ALBUMIN 4.1 01/29/2019 1436   AST 24 01/29/2019 1436   ALT 16 01/29/2019 1436   ALKPHOS 54 01/29/2019 1436   BILITOT 0.4 01/29/2019 1436   GFRNONAA 86 01/29/2019 1436   GFRAA 100 01/29/2019 1436    Assessment: 1.  Epigastric pain:  Chronic but worse over the past 5 to 6 months with reflux that wakes him from his sleep regardless of Omeprazole 40 twice daily, history of ulcers in 2017; consider repeat PUD+/-H. pylori versus gastritis versus other 2.  GERD  Plan: 1.  Stop Omeprazole.  Prescribed  Pantoprazole 40 mg twice daily, 30-60 minutes before breakfast and dinner #60 with 5 refills 2.  Prescribed Pepcid 40 mg twice daily, every morning and nightly #60 with 5 refills 3.  Scheduled patient for an EGD in the [place] with Dr. [name].  Did discuss risks, benefits, limitations and alternatives and the patient agrees to proceed.  Patient was scheduled for Covid test 2 days prior to time of procedure. 4.  Patient to follow in clinic per recommendations from Dr. [name] after time of procedure.  Ellouise Newer, PA-C [facility] 02/24/2019, 3:30 PM  Cc: Rodriguez-Southworth, S*

## 2019-02-24 NOTE — Progress Notes (Signed)
Chief Complaint: Pain of upper abdomen  HPI:    [name] is a 49 year old African-American male with a past medical history as listed below including reflux, seizures and PTSD, known to Dr. [name], who was referred to me by Rodriguez-Southworth, S* for a complaint of upper abdominal pain.     05/06/2015 EGD to follow-up after ulcers with findings of a normal esophagus, diffuse mild inflammation characterized by erythema in the antrum, previously noted gastric ulcer was healed.    02/27/2016 right upper quadrant ultrasound with no sonographic evidence for acute cholecystitis or biliary dilatation, slight increased hepatic echogenicity consistent with fatty infiltration.    11/24/2018 screening colonoscopy with three 3-5 mm polyps in the rectum, sigmoid colon and descending colon, exam otherwise normal.  Pathology with tubular adenomas.  Repeat recommended in 3 years.    01/29/2019 CBC and CMP normal.    01/29/2019 saw PCP and described some sharp pains in his epigastrium which can last 20 minutes and been him over.  They are worse at night.      Today, the patient tells me that he has had trouble with reflux for years, but it was "somewhat okay" until the past 5 to 6 months.  He has been using Omeprazole 40 mg twice daily.  Explains that now anytime he eats anything he will have some epigastric pain described as a sharp pain for at least a few minutes and then it eases off.  Rated as a 4-5/10. This all is worse when he lays down to sleep at night.  He tells me he will be awakened at sometimes 2 or 3 in the morning with reflux and acid in his mouth which makes him nauseous.  Does explain he had a bad episode yesterday but ate at least 10 or more Oreos right before laying down to sleep.  Denies eating a lot of fatty greasy foods or drinking a lot of alcohol or caffeine.    Denies fever, chills, weight loss, change in bowel habits or melena.     Past Medical History:  Diagnosis Date  . Abdominal pain     intermittent  . Allergy    SEASONAL  . Anemia   . Aneurysm ([place])   . Asthma   . GERD (gastroesophageal reflux disease)   . Pancreatitis   . PTSD (post-traumatic stress disorder)    controlled  . PTSD (post-traumatic stress disorder)   . PTSD (post-traumatic stress disorder) 2008  . Rectal polyp 01/09/2006   Hyperplastic  . Seizures ([place])    ADMITS TAKING MEDICATION FOR SEIZURE D/T ANEURYSM ,DO NOT KNOW IF HAD SEIZURE. 11/10/18    Past Surgical History:  Procedure Laterality Date  . arm surgery Left    repair of tendons "work injury cut"-"pinky finger numb"  . BURR HOLE Right 02/28/2015   Procedure: BURR HOLES FOR SUBDURAL HEMATOMA;  Surgeon: Newman Pies, MD;  Location: [facility];  Service: Neurosurgery;  Laterality: Right;  . CEREBRAL ANEURYSM REPAIR    . COLONOSCOPY  2016   colon polyps  . ESOPHAGOGASTRODUODENOSCOPY (EGD) WITH PROPOFOL N/A 01/13/2015   Procedure: ESOPHAGOGASTRODUODENOSCOPY (EGD) WITH PROPOFOL;  Surgeon: [name], MD;  Location: [facility];  Service: Endoscopy;  Laterality: N/A;  . ESOPHAGOGASTRODUODENOSCOPY ENDOSCOPY    . HERNIA REPAIR     Umbilical  . laceration of left arm    . 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  dicyclomine (BENTYL) 20 MG tablet Take 1 tablet (20 mg total) by mouth 4 (four) times daily -  before meals and at bedtime. 90 tablet 1  . docusate sodium (COLACE) 100 MG capsule TAKE 1 CAPSULE BY MOUTH TWICE A DAY 60 capsule 2  . folic acid (FOLVITE) 1 MG tablet TAKE 1 TABLET BY MOUTH EVERY DAY 90 tablet 1  . levETIRAcetam (KEPPRA) 500 MG tablet Take 1 tablet (500 mg total) by mouth 2 (two) times daily. 180 tablet 3  . lipase/protease/amylase (CREON) 36000 UNITS CPEP capsule Take 72,000-108,000 Units by mouth 5 (five) times daily. Pt takes three capsules three times daily with meals and two capsules two times daily with snacks.    . loratadine  (CLARITIN) 10 MG tablet Take 1 tablet (10 mg total) by mouth daily. 30 tablet 5  . methocarbamol (ROBAXIN) 500 MG tablet Take 1 tablet (500 mg total) by mouth at bedtime as needed for muscle spasms. 12 tablet 0  . metoCLOPramide (REGLAN) 10 MG tablet TAKE 1 TO 2 TABLETS BY MOUTH EVERY DAY AT BEDTIME 60 tablet 0  . Multiple Vitamin (MULTIVITAMIN WITH MINERALS) TABS tablet Take 1 tablet by mouth daily.    [name] omeprazole (PRILOSEC) 40 MG capsule TAKE 1 CAPSULE BY MOUTH EVERY DAY 90 capsule 1  . ondansetron (ZOFRAN) 4 MG tablet Take 1 tablet (4 mg total) by mouth every 6 (six) hours. 12 tablet 0  . oxybutynin (DITROPAN) 5 MG tablet     . tetrahydrozoline-zinc (VISINE-AC) 0.05-0.25 % ophthalmic solution Place 2 drops into both eyes 3 (three) times daily as needed (Itching).    . thiamine (VITAMIN B-1) 100 MG tablet TAKE 1 TABLET BY MOUTH EVERY DAY 100 tablet 1   No current facility-administered medications for this visit.    Allergies as of 02/24/2019 - Review Complete 01/29/2019  Allergen Reaction Noted  . Aspirin Anaphylaxis 01/12/2009  . Ibuprofen Anaphylaxis 01/12/2009  . Hydrocodone Hives and Itching 12/30/2014  . Tylenol [acetaminophen]  04/02/2018    Family History  Problem Relation Age of Onset  . Stroke Mother   . Hypertension Mother   . Diabetes Father   . Pancreatic cancer Maternal Uncle   . Colon cancer Maternal Uncle   . Cancer Other   . Diabetes Other   . Colon cancer Maternal Grandfather   . Colon cancer Paternal Grandfather   . Pancreatic cancer Cousin   . Esophageal cancer Neg Hx     Social History   Socioeconomic History  . Marital status: Single    Spouse name: Not on file  . Number of children: Not on file  . Years of education: Not on file  . Highest education level: Not on file  Occupational History  . Occupation: disable  Tobacco Use  . Smoking status: Current Every Day Smoker    Packs/day: 0.00    Types: Cigars  . Smokeless tobacco: Never Used  .  Tobacco comment: smokes a black and mild daily  Substance and Sexual Activity  . Alcohol use: No  . Drug use: Yes    Types: Marijuana    Comment: Quit-none in 1 month  . Sexual activity: Yes  Other Topics Concern  . 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 Difficulty of Paying Living Expenses: Not hard at all  Food Insecurity: No Food Insecurity  . Worried About Charity fundraiser in the Last Year: Never true  . Ran Out of Food in the Last Year: Never true  Transportation Needs: No Transportation Needs  . Lack of Transportation (Medical): No  . Lack of Transportation (Non-Medical): No  Physical Activity: Sufficiently Active  . Days of Exercise per Week: 7 days  . Minutes of Exercise per Session: 30 min  Stress:   . Feeling of Stress : Not on file  Social Connections:   . Frequency of Communication with Friends and Family: Not on file  . Frequency of Social Gatherings with Friends and Family: Not on file  . Attends Religious Services: Not on file  . Active Member of Clubs or Organizations: Not on file  . Attends Archivist Meetings: Not on file  . Marital Status: Not on file  Intimate Partner Violence: Not At Risk  . Fear of Current or Ex-Partner: No  . Emotionally Abused: No  . Physically Abused: No  . Sexually Abused: No    Review of Systems:    Constitutional: No weight loss, fever or chills Cardiovascular: No chest pain Respiratory: No SOB  Gastrointestinal: See HPI and otherwise negative   Physical Exam:  Vital signs: BP 92/70   Pulse 76   Temp 98.7 F (37.1 C)   Ht 6\' 1"  (1.854 m)   Wt 170 lb (77.1 kg)   BMI 22.43 kg/m   Constitutional:   Pleasant AA male appears to be in NAD, Well developed, Well nourished,  alert and cooperative Respiratory: Respirations even and unlabored. Lungs clear to auscultation bilaterally.   No wheezes, crackles, or rhonchi.  Cardiovascular: Normal S1, S2. No MRG. Regular rate and rhythm. No peripheral edema, cyanosis or pallor.  Gastrointestinal:  Soft, nondistended, moderate epigastric ttp with involuntary guarding. No rebound or guarding. Normal bowel sounds. No appreciable masses or hepatomegaly. Rectal:  Not performed.  Psychiatric:  Demonstrates good judgement and reason without abnormal affect or behaviors.  MOST RECENT LABS AND IMAGING: CBC    Component Value Date/Time   WBC 3.4 01/29/2019 1436   WBC 3.4 (L) 04/20/2017 1118   RBC 4.33 01/29/2019 1436   RBC 4.54 04/20/2017 1118   HGB 13.5 01/29/2019 1436   HCT 39.6 01/29/2019 1436   PLT 185 01/29/2019 1436   MCV 92 01/29/2019 1436   MCH 31.2 01/29/2019 1436   MCH 31.1 04/20/2017 1118   MCHC 34.1 01/29/2019 1436   MCHC 33.8 04/20/2017 1118   RDW 12.9 01/29/2019 1436   LYMPHSABS 2.0 01/29/2019 1436   MONOABS 0.5 03/05/2015 1957   EOSABS 0.0 01/29/2019 1436   BASOSABS 0.0 01/29/2019 1436    CMP     Component Value Date/Time   NA 134 01/29/2019 1436   K 4.3 01/29/2019 1436   CL 97 01/29/2019 1436   CO2 25 01/29/2019 1436   GLUCOSE 74 01/29/2019 1436   GLUCOSE 102 (H) 04/20/2017 1118   BUN 8 01/29/2019 1436   CREATININE 1.01 01/29/2019 1436   CALCIUM 8.9 01/29/2019 1436   PROT 6.7 01/29/2019 1436   ALBUMIN 4.1 01/29/2019 1436   AST 24 01/29/2019 1436   ALT 16 01/29/2019 1436   ALKPHOS 54 01/29/2019 1436   BILITOT 0.4 01/29/2019 1436   GFRNONAA 86 01/29/2019 1436   GFRAA 100 01/29/2019 1436    Assessment: 1.  Epigastric pain:  Chronic but worse over the past 5 to 6 months with reflux that wakes him from his sleep regardless of Omeprazole 40 twice daily, history of ulcers in 2017; consider repeat PUD+/-H. pylori versus gastritis versus other 2.  GERD  Plan: 1.  Stop Omeprazole.  Prescribed  Pantoprazole 40 mg twice daily, 30-60 minutes before breakfast and dinner #60 with 5 refills 2.  Prescribed Pepcid 40 mg twice daily, every morning and nightly #60 with 5 refills 3.  Scheduled patient for an EGD in the [facility] with Dr. [name].  Did discuss risks, benefits, limitations and alternatives and the patient agrees to proceed.  Patient was scheduled for Covid test 2 days prior to time of procedure. 4.  Patient to follow in clinic per recommendations from Dr. [name] after time of procedure.  Ellouise Newer, PA-C [facility] 02/24/2019, 3:30 PM  Cc: Rodriguez-Southworth, S*

## 2019-02-24 NOTE — Progress Notes (Signed)
I agree with the above note, plan 

## 2019-02-24 NOTE — Patient Instructions (Addendum)
If you are age 51 or older, your body mass index should be between 23-30. Your Body mass index is 22.43 kg/m. If this is out of the aforementioned range listed, please consider follow up with your Primary Care Provider.  If you are age 56 or younger, your body mass index should be between 19-25. Your Body mass index is 22.43 kg/m. If this is out of the aformentioned range listed, please consider follow up with your Primary Care Provider.   We have sent the following medications to your pharmacy for you to pick up at your convenience: Stop Omeprazole Start Pantoprazole 40 mg twice daily 30-60 minutes before breakfast and dinner.  Pepcid 40 mg twice daily.   You have been scheduled for an endoscopy. Please follow written instructions given to you at your visit today. If you use inhalers (even only as needed), please bring them with you on the day of your procedure.

## 2019-02-26 ENCOUNTER — Ambulatory Visit (INDEPENDENT_AMBULATORY_CARE_PROVIDER_SITE_OTHER): Payer: Medicare HMO

## 2019-02-26 DIAGNOSIS — Z1159 Encounter for screening for other viral diseases: Secondary | ICD-10-CM

## 2019-02-27 LAB — SARS CORONAVIRUS 2 (TAT 6-24 HRS): SARS Coronavirus 2: NEGATIVE

## 2019-03-02 ENCOUNTER — Other Ambulatory Visit: Payer: Self-pay

## 2019-03-02 ENCOUNTER — Encounter: Payer: Self-pay | Admitting: Gastroenterology

## 2019-03-02 ENCOUNTER — Ambulatory Visit (AMBULATORY_SURGERY_CENTER): Payer: Medicare HMO | Admitting: Gastroenterology

## 2019-03-02 ENCOUNTER — Other Ambulatory Visit (INDEPENDENT_AMBULATORY_CARE_PROVIDER_SITE_OTHER): Payer: Medicare HMO

## 2019-03-02 VITALS — BP 129/81 | HR 71 | Temp 98.5°F | Resp 12 | Ht 73.0 in | Wt 170.0 lb

## 2019-03-02 DIAGNOSIS — Z8711 Personal history of peptic ulcer disease: Secondary | ICD-10-CM

## 2019-03-02 DIAGNOSIS — K219 Gastro-esophageal reflux disease without esophagitis: Secondary | ICD-10-CM

## 2019-03-02 DIAGNOSIS — R1013 Epigastric pain: Secondary | ICD-10-CM

## 2019-03-02 DIAGNOSIS — C169 Malignant neoplasm of stomach, unspecified: Secondary | ICD-10-CM

## 2019-03-02 DIAGNOSIS — K2950 Unspecified chronic gastritis without bleeding: Secondary | ICD-10-CM

## 2019-03-02 DIAGNOSIS — Z8719 Personal history of other diseases of the digestive system: Secondary | ICD-10-CM

## 2019-03-02 DIAGNOSIS — K259 Gastric ulcer, unspecified as acute or chronic, without hemorrhage or perforation: Secondary | ICD-10-CM

## 2019-03-02 DIAGNOSIS — B9681 Helicobacter pylori [H. pylori] as the cause of diseases classified elsewhere: Secondary | ICD-10-CM

## 2019-03-02 LAB — CBC WITH DIFFERENTIAL/PLATELET
Basophils Absolute: 0 10*3/uL (ref 0.0–0.1)
Basophils Relative: 0.5 % (ref 0.0–3.0)
Eosinophils Absolute: 0 10*3/uL (ref 0.0–0.7)
Eosinophils Relative: 0.8 % (ref 0.0–5.0)
HCT: 38.3 % — ABNORMAL LOW (ref 39.0–52.0)
Hemoglobin: 12.7 g/dL — ABNORMAL LOW (ref 13.0–17.0)
Lymphocytes Relative: 57 % — ABNORMAL HIGH (ref 12.0–46.0)
Lymphs Abs: 1.8 10*3/uL (ref 0.7–4.0)
MCHC: 33.2 g/dL (ref 30.0–36.0)
MCV: 93 fl (ref 78.0–100.0)
Monocytes Absolute: 0.4 10*3/uL (ref 0.1–1.0)
Monocytes Relative: 11.7 % (ref 3.0–12.0)
Neutro Abs: 0.9 10*3/uL — ABNORMAL LOW (ref 1.4–7.7)
Neutrophils Relative %: 30 % — ABNORMAL LOW (ref 43.0–77.0)
Platelets: 176 10*3/uL (ref 150.0–400.0)
RBC: 4.11 Mil/uL — ABNORMAL LOW (ref 4.22–5.81)
RDW: 14.3 % (ref 11.5–15.5)
WBC: 3.1 10*3/uL — ABNORMAL LOW (ref 4.0–10.5)

## 2019-03-02 MED ORDER — SODIUM CHLORIDE 0.9 % IV SOLN: 500.0000 mL | Freq: Once | INTRAVENOUS | Status: DC

## 2019-03-02 NOTE — Progress Notes (Signed)
bc

## 2019-03-02 NOTE — Op Note (Signed)
[facility] Patient Name: [name] Procedure Date: 03/02/2019 1:56 PM MRN: [id number] Endoscopist: [name] , MD Age: 49 Referring MD:  Date of Birth: 04-13-1970 Gender: Male Account #: [id number] Procedure:                Upper GI endoscopy Indications:              Epigastric abdominal pain; EGD 2016 gastric ulcer,                            + hpylori; repeat EGD 2017 ulcer had head but H.                            pylori still present; retreated Medicines:                Monitored Anesthesia Care Procedure:                Pre-Anesthesia Assessment:                           - Prior to the procedure, a History and Physical                            was performed, and patient medications and                            allergies were reviewed. The patient's tolerance of                            previous anesthesia was also reviewed. The risks                            and benefits of the procedure and the sedation                            options and risks were discussed with the patient.                            All questions were answered, and informed consent                            was obtained. Prior Anticoagulants: The patient has                            taken no previous anticoagulant or antiplatelet                            agents. ASA Grade Assessment: II - A patient with                            mild systemic disease. After reviewing the risks                            and benefits, the patient was deemed in  satisfactory condition to undergo the procedure.                           After obtaining informed consent, the endoscope was                            passed under direct vision. Throughout the                            procedure, the patient's blood pressure, pulse, and                            oxygen saturations were monitored continuously. The                            Endoscope was introduced  through the mouth, and                            advanced to the second part of duodenum. The upper                            GI endoscopy was accomplished without difficulty.                            The patient tolerated the procedure well. Scope In: Scope Out: Findings:                 6-88mm very friable, shallow ulcer in the greater                            curvature of the stomach, apparently in the same                            location as 2016 H. pylori positive ulcer. The                            current ulcer is surrounded by irregularly,                            somewhat neoplastic appearing, firm mucosa.                            Extensive biopsies taken from directly surrounding                            the ulcer.                           Mild inflammation characterized by erythema and                            friability was found in the gastric antrum.                            Biopsies were taken with a  cold forceps for                            histology.                           The exam was otherwise without abnormality. Complications:            No immediate complications. Estimated blood loss:                            None. Estimated Blood Loss:     Estimated blood loss: none. Impression:               - 6-62mm very friable, shallow ulcer in the greater                            curvature of the stomach, apparently in the same                            location as 2016 H. pylori positive ulcer. The                            current ulcer is surrounded by irregularly,                            somewhat neoplastic appearing, firm mucosa.                            Extensive biopsies taken from directly surrounding                            the ulcer.                           - Gastritis in distal stomach, biopsied to check                            for H. pylori. Recommendation:           - Patient has a contact number available for                             emergencies. The signs and symptoms of potential                            delayed complications were discussed with the                            patient. Return to normal activities tomorrow.                            Written discharge instructions were provided to the                            patient.                           -  Resume previous diet.                           - Continue present medications; protonix twice                            daily already called in last week.                           - Await pathology results.                           - CBC today to see if the very friable gastric                            ulcer is causing anemia. [name], MD 03/02/2019 2:26:34 PM This report has been signed electronically.

## 2019-03-02 NOTE — Progress Notes (Signed)
Called to room to assist during endoscopic procedure.  Patient ID and intended procedure confirmed with present staff. Received instructions for my participation in the procedure from the performing physician.  Pathology sent rush per D.O. 

## 2019-03-02 NOTE — Progress Notes (Signed)
To PACU, VSS. Report to [email or web address] 

## 2019-03-02 NOTE — Progress Notes (Signed)
VS- [facility] Temperature- June Bullock  Last seizure per pt- 03-04-2015

## 2019-03-02 NOTE — Patient Instructions (Signed)
The biopsies taken today have been sent for pathology.  The results can take 1-3 weeks to receive.    You may resume your previous diet and medication schedule.  Thank you for allowing [place] to care for you today!!!  YOU HAD AN ENDOSCOPIC PROCEDURE TODAY AT [facility]:   Refer to the procedure report that was given to you for any specific questions about what was found during the examination.  If the procedure report does not answer your questions, please call your gastroenterologist to clarify.  If you requested that your care partner not be given the details of your procedure findings, then the procedure report has been included in a sealed envelope for you to review at your convenience later.  Please Note:  You might notice some irritation and congestion in your nose or some drainage.  This is from the oxygen used during your procedure.  There is no need for concern and it should clear up in a day or so.  SYMPTOMS TO REPORT IMMEDIATELY:   Following upper endoscopy (EGD)  Vomiting of blood or coffee ground material  New chest pain or pain under the shoulder blades  Painful or persistently difficult swallowing  New shortness of breath  Fever of 100F or higher  Black, tarry-looking stools  For urgent or emergent issues, a gastroenterologist can be reached at any hour by calling [phone].   DIET:  We do recommend a small meal at first, but then you may proceed to your regular diet.  Drink plenty of fluids but you should avoid alcoholic beverages for 24 hours.  ACTIVITY:  You should plan to take it easy for the rest of today and you should NOT DRIVE or use heavy machinery until tomorrow (because of the sedation medicines used during the test).    FOLLOW UP: Our staff will call the number listed on your records 48-72 hours following your procedure to check on you and address any questions or concerns that you may have regarding the information given to you following  your procedure. If we do not reach you, we will leave a message.  We will attempt to reach you two times.  During this call, we will ask if you have developed any symptoms of COVID 19. If you develop any symptoms (ie: fever, flu-like symptoms, shortness of breath, cough etc.) before then, please call [phone].  If you test positive for Covid 19 in the 2 weeks post procedure, please call and report this information to [place].    If any biopsies were taken you will be contacted by phone or by letter within the next 1-3 weeks.  Please call us at [phone] if you have not heard about the biopsies in 3 weeks.    SIGNATURES/CONFIDENTIALITY: You and/or your care partner have signed paperwork which will be entered into your electronic medical record.  These signatures attest to the fact that that the information above on your After Visit Summary has been reviewed and is understood.  Full responsibility of the confidentiality of this discharge information lies with you and/or your care-partner.

## 2019-03-03 ENCOUNTER — Telehealth: Payer: Self-pay | Admitting: Gastroenterology

## 2019-03-03 DIAGNOSIS — C169 Malignant neoplasm of stomach, unspecified: Secondary | ICD-10-CM

## 2019-03-03 MED ORDER — PYLERA 140-125-125 MG PO CAPS: 3.0000 | ORAL_CAPSULE | Freq: Three times a day (TID) | ORAL | 0 refills | Status: DC

## 2019-03-03 NOTE — Telephone Encounter (Signed)
I spoke with the pt and advised him that his question would be better answered by oncology.  I also advised I will be setting him up for [facility] referral and CT chest, abd, and pelvis.  Pylera also sent to the pharmacy   You have been scheduled for a CT scan of the abdomen and pelvis at [facility] ([address]---this is in the same building as [employer]).   You are scheduled on 03/06/19 at 2 pm. You should arrive 15 minutes prior to your appointment time for registration. Please follow the written instructions below on the day of your exam:   Referral made to [facility]   The patient has been notified of this information and all questions answered.

## 2019-03-04 ENCOUNTER — Other Ambulatory Visit: Payer: Self-pay

## 2019-03-04 ENCOUNTER — Telehealth: Payer: Self-pay | Admitting: Gastroenterology

## 2019-03-04 ENCOUNTER — Telehealth: Payer: Self-pay

## 2019-03-04 MED ORDER — PANTOPRAZOLE SODIUM 40 MG PO TBEC: 40.0000 mg | DELAYED_RELEASE_TABLET | Freq: Two times a day (BID) | ORAL | 5 refills | Status: DC

## 2019-03-04 MED ORDER — DOXYCYCLINE HYCLATE 100 MG PO CAPS: 100.0000 mg | ORAL_CAPSULE | Freq: Two times a day (BID) | ORAL | 0 refills | Status: DC

## 2019-03-04 MED ORDER — BISMUTH SUBSALICYLATE 262 MG PO CHEW: 524.0000 mg | CHEWABLE_TABLET | Freq: Four times a day (QID) | ORAL | 0 refills | Status: DC

## 2019-03-04 MED ORDER — OMEPRAZOLE 20 MG PO CPDR: 20.0000 mg | DELAYED_RELEASE_CAPSULE | Freq: Two times a day (BID) | ORAL | 0 refills | Status: DC

## 2019-03-04 MED ORDER — METRONIDAZOLE 250 MG PO TABS: 250.0000 mg | ORAL_TABLET | Freq: Four times a day (QID) | ORAL | 0 refills | Status: DC

## 2019-03-04 MED ORDER — PYLERA 140-125-125 MG PO CAPS: 3.0000 | ORAL_CAPSULE | Freq: Three times a day (TID) | ORAL | 0 refills | Status: DC

## 2019-03-04 NOTE — Telephone Encounter (Signed)
Covid-19 screening questions   Do you now or have you had a fever in the last 14 days? No.  Do you have any respiratory symptoms of shortness of breath or cough now or in the last 14 days? No.  Do you have any family members or close contacts with diagnosed or suspected Covid-19 in the past 14 days? No.  Have you been tested for Covid-19 and found to be positive? No.       Follow up Call-  Call back number 03/02/2019 11/24/2018  Post procedure Call Back phone  # [phone]  Permission to leave phone message Yes Yes  Some recent data might be hidden     Patient questions:  Do you have a fever, pain , or abdominal swelling? No.  Pain Score  0 *  Have you tolerated food without any problems? Yes.    Have you been able to return to your normal activities? Yes.    Do you have any questions about your discharge instructions: Diet   No. Medications  No. Follow up visit  No.  Do you have questions or concerns about your Care? Pt. Reports he has been unable to acquire his Protonix prescription.  Has returned to his diet and activities, but has some nausea, vomited a bit this morning.  Told pt. To call if he is unable to obtain his Protonix today and initiate treatment, so we can begin treatment asap.  Actions: * If pain score is 4 or above: No action needed, pain <4.

## 2019-03-04 NOTE — Telephone Encounter (Signed)
Pt stated that insurance requires a PA for Pylera.

## 2019-03-04 NOTE — Telephone Encounter (Signed)
Pylera not covered by his insurance. I broke it down into the flagyl 250mg  one QID, doxycycline 100mg  one BID, pepto bismol  262mg  Two QID (alerts to an allergy because of ASA and ibuprofen but patient said he takes it) and omeprazole 20 mg one BID.   Patient informed of the break down and says he already has the doxycycline 100mg  and he has omeprazole 40mg  he takes once daily. I told him to take either the omeprazole 40mg  once daily or omeprazole 20mg  BID but not both. I confirmed and he is also on pantoprazole 40mg  one BID. I will route to Dr [name] to advise does he need to hold the pantoprazole while he does this H. Pylera treatment.

## 2019-03-04 NOTE — Telephone Encounter (Signed)
I spoke with Dr [name] and he said for [name] to only take one PPI , not multiple ones. I spoke with [name] and told him to put the pantoprazole up and not take it since he is on the omeprazole. He verbalized understanding.

## 2019-03-05 ENCOUNTER — Ambulatory Visit: Payer: Medicare HMO | Admitting: Internal Medicine

## 2019-03-06 ENCOUNTER — Other Ambulatory Visit: Payer: Self-pay

## 2019-03-06 ENCOUNTER — Encounter (HOSPITAL_COMMUNITY): Payer: Self-pay

## 2019-03-06 ENCOUNTER — Ambulatory Visit (HOSPITAL_COMMUNITY)
Admission: RE | Admit: 2019-03-06 | Discharge: 2019-03-06 | Disposition: A | Discharge status: Home / Self Care | Payer: Medicare HMO | Source: Ambulatory Visit | Attending: Gastroenterology | Admitting: Gastroenterology

## 2019-03-06 DIAGNOSIS — C169 Malignant neoplasm of stomach, unspecified: Secondary | ICD-10-CM | POA: Diagnosis not present

## 2019-03-06 HISTORY — DX: Unspecified asthma, uncomplicated: J45.909

## 2019-03-06 MED ORDER — IOHEXOL 300 MG/ML  SOLN
100.0000 mL | Freq: Once | INTRAMUSCULAR | Status: AC | PRN
  Administered 2019-03-06: 100 mL via INTRAVENOUS

## 2019-03-06 MED ORDER — SODIUM CHLORIDE (PF) 0.9 % IJ SOLN
INTRAMUSCULAR | Status: AC
  Filled 2019-03-06: qty 50

## 2019-03-09 ENCOUNTER — Ambulatory Visit (INDEPENDENT_AMBULATORY_CARE_PROVIDER_SITE_OTHER): Payer: Medicare HMO | Admitting: Nurse Practitioner

## 2019-03-09 ENCOUNTER — Encounter: Payer: Self-pay | Admitting: Nurse Practitioner

## 2019-03-09 ENCOUNTER — Other Ambulatory Visit: Payer: Self-pay

## 2019-03-09 ENCOUNTER — Other Ambulatory Visit: Payer: Self-pay | Admitting: General Surgery

## 2019-03-09 VITALS — BP 114/80 | HR 75 | Temp 97.9°F | Ht 73.2 in | Wt 171.0 lb

## 2019-03-09 DIAGNOSIS — R1010 Upper abdominal pain, unspecified: Secondary | ICD-10-CM

## 2019-03-09 DIAGNOSIS — R1030 Lower abdominal pain, unspecified: Secondary | ICD-10-CM

## 2019-03-09 DIAGNOSIS — C169 Malignant neoplasm of stomach, unspecified: Secondary | ICD-10-CM | POA: Diagnosis not present
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
## 2019-03-10 ENCOUNTER — Telehealth: Payer: Self-pay

## 2019-03-10 ENCOUNTER — Other Ambulatory Visit: Payer: Self-pay

## 2019-03-10 DIAGNOSIS — C169 Malignant neoplasm of stomach, unspecified: Secondary | ICD-10-CM

## 2019-03-10 NOTE — Telephone Encounter (Signed)
EUS scheduled for 03/19/19 and COVID testing on 2/1 at 1010 am, pt instructed and medications reviewed.  Patient instructions mailed to home.  Patient to call with any questions or concerns.

## 2019-03-10 NOTE — Telephone Encounter (Signed)
Yes, thanks

## 2019-03-10 NOTE — Telephone Encounter (Signed)
Dr [name] the case would have to start at 1:45 pm.  Is that ok?

## 2019-03-10 NOTE — Telephone Encounter (Signed)
-----   Message from [name], MD sent at 03/09/2019  5:11 PM EST ----- Gwenyth Ober not sure you'll find it and so I'm happy to tattoo it.  Annemarie Sebree, Let's put him on for 2/4 EUS (add to end of my day) to stage the gastric cancer and also tattoo it for eventual surgery.  Priority 1 case.  Thanks  DJ ----- Message ----- From: [name], MD Sent: 03/09/2019   3:31 PM EST To: [name], MD  Do you think this ulcer is firm enough that I can find it during surgery?  Wondering about EUS and tattoo of the ulcer.   Fb

## 2019-03-14 ENCOUNTER — Encounter: Payer: Self-pay | Admitting: Nurse Practitioner

## 2019-03-16 ENCOUNTER — Other Ambulatory Visit (HOSPITAL_COMMUNITY)
Admission: RE | Admit: 2019-03-16 | Discharge: 2019-03-16 | Disposition: A | Discharge status: Home / Self Care | Payer: Medicare HMO | Source: Ambulatory Visit | Attending: Gastroenterology | Admitting: Gastroenterology

## 2019-03-16 DIAGNOSIS — Z01812 Encounter for preprocedural laboratory examination: Secondary | ICD-10-CM | POA: Diagnosis not present

## 2019-03-16 DIAGNOSIS — Z20822 Contact with and (suspected) exposure to covid-19: Secondary | ICD-10-CM | POA: Insufficient documentation

## 2019-03-16 LAB — SARS CORONAVIRUS 2 (TAT 6-24 HRS): SARS Coronavirus 2: NEGATIVE

## 2019-03-17 ENCOUNTER — Encounter (HOSPITAL_COMMUNITY): Payer: Self-pay | Admitting: Gastroenterology

## 2019-03-17 NOTE — Progress Notes (Signed)
Attempted to call patient twice about upper endo and EGD with Dr [name] on 03/19/19. Message left for patient to call.

## 2019-03-19 ENCOUNTER — Ambulatory Visit (HOSPITAL_COMMUNITY): Payer: Medicare HMO | Admitting: Anesthesiology

## 2019-03-19 ENCOUNTER — Ambulatory Visit (HOSPITAL_COMMUNITY)
Admission: RE | Admit: 2019-03-19 | Discharge: 2019-03-19 | Disposition: A | Discharge status: Home / Self Care | Payer: Medicare HMO | Attending: Gastroenterology | Admitting: Gastroenterology

## 2019-03-19 ENCOUNTER — Encounter (HOSPITAL_COMMUNITY): Payer: Self-pay | Admitting: Gastroenterology

## 2019-03-19 ENCOUNTER — Other Ambulatory Visit: Payer: Self-pay

## 2019-03-19 ENCOUNTER — Encounter (HOSPITAL_COMMUNITY)
Admission: RE | Disposition: A | Discharge status: Home / Self Care | Payer: Self-pay | Source: Home / Self Care | Attending: Gastroenterology

## 2019-03-19 DIAGNOSIS — Z79899 Other long term (current) drug therapy: Secondary | ICD-10-CM | POA: Insufficient documentation

## 2019-03-19 DIAGNOSIS — F329 Major depressive disorder, single episode, unspecified: Secondary | ICD-10-CM | POA: Diagnosis not present

## 2019-03-19 DIAGNOSIS — C169 Malignant neoplasm of stomach, unspecified: Secondary | ICD-10-CM

## 2019-03-19 DIAGNOSIS — F4310 Post-traumatic stress disorder, unspecified: Secondary | ICD-10-CM | POA: Diagnosis not present

## 2019-03-19 DIAGNOSIS — F17210 Nicotine dependence, cigarettes, uncomplicated: Secondary | ICD-10-CM | POA: Insufficient documentation

## 2019-03-19 DIAGNOSIS — F419 Anxiety disorder, unspecified: Secondary | ICD-10-CM | POA: Insufficient documentation

## 2019-03-19 DIAGNOSIS — Z886 Allergy status to analgesic agent status: Secondary | ICD-10-CM | POA: Diagnosis not present

## 2019-03-19 DIAGNOSIS — R569 Unspecified convulsions: Secondary | ICD-10-CM | POA: Diagnosis not present

## 2019-03-19 DIAGNOSIS — J45909 Unspecified asthma, uncomplicated: Secondary | ICD-10-CM | POA: Diagnosis not present

## 2019-03-19 DIAGNOSIS — Z885 Allergy status to narcotic agent status: Secondary | ICD-10-CM | POA: Diagnosis not present

## 2019-03-19 DIAGNOSIS — K219 Gastro-esophageal reflux disease without esophagitis: Secondary | ICD-10-CM | POA: Diagnosis not present

## 2019-03-19 DIAGNOSIS — K3189 Other diseases of stomach and duodenum: Secondary | ICD-10-CM

## 2019-03-19 DIAGNOSIS — F418 Other specified anxiety disorders: Secondary | ICD-10-CM | POA: Diagnosis not present

## 2019-03-19 HISTORY — PX: ESOPHAGOGASTRODUODENOSCOPY (EGD) WITH PROPOFOL: SHX5813

## 2019-03-19 HISTORY — PX: EUS: SHX5427

## 2019-03-19 HISTORY — PX: SUBMUCOSAL INJECTION: SHX5543

## 2019-03-19 SURGERY — UPPER ENDOSCOPIC ULTRASOUND (EUS) RADIAL
Anesthesia: Monitor Anesthesia Care

## 2019-03-19 MED ORDER — PROPOFOL 500 MG/50ML IV EMUL
INTRAVENOUS | Status: DC | PRN
  Administered 2019-03-19: 150 ug/kg/min via INTRAVENOUS

## 2019-03-19 MED ORDER — SODIUM CHLORIDE 0.9 % IV SOLN: INTRAVENOUS | Status: DC

## 2019-03-19 MED ORDER — LACTATED RINGERS IV SOLN
INTRAVENOUS | Status: AC | PRN
  Administered 2019-03-19: 1000 mL via INTRAVENOUS

## 2019-03-19 MED ORDER — PROPOFOL 500 MG/50ML IV EMUL
INTRAVENOUS | Status: AC
  Filled 2019-03-19: qty 50

## 2019-03-19 MED ORDER — PROPOFOL 10 MG/ML IV BOLUS
INTRAVENOUS | Status: DC | PRN
  Administered 2019-03-19: 50 mg via INTRAVENOUS
  Administered 2019-03-19 (×2): 20 mg via INTRAVENOUS
  Administered 2019-03-19: 30 mg via INTRAVENOUS

## 2019-03-19 MED ORDER — SPOT INK MARKER SYRINGE KIT
PACK | SUBMUCOSAL | Status: AC
  Filled 2019-03-19: qty 5

## 2019-03-19 MED ORDER — SPOT INK MARKER SYRINGE KIT
PACK | SUBMUCOSAL | Status: DC | PRN
  Administered 2019-03-19: 5 mL via SUBMUCOSAL

## 2019-03-19 SURGICAL SUPPLY — 15 items

## 2019-03-19 NOTE — Transfer of Care (Signed)
Immediate Anesthesia Transfer of Care Note  Patient: [name]  Procedure(s) Performed: UPPER ENDOSCOPIC ULTRASOUND (EUS) RADIAL (N/A ) ESOPHAGOGASTRODUODENOSCOPY (EGD) WITH PROPOFOL (N/A ) SUBMUCOSAL INJECTION  Patient Location: Endoscopy Unit  Anesthesia Type:MAC  Level of Consciousness: drowsy  Airway & Oxygen Therapy: Patient Spontanous Breathing and Patient connected to face mask  Post-op Assessment: Report given to RN and Post -op Vital signs reviewed and stable  Post vital signs: Reviewed and stable  Last Vitals:  Vitals Value Taken Time  BP 117/77 03/19/19 1500  Temp    Pulse 56 03/19/19 1500  Resp 19 03/19/19 1500  SpO2 100 % 03/19/19 1500  Vitals shown include unvalidated device data.  Last Pain:  Vitals:   03/19/19 1246  TempSrc: Oral  PainSc: 0-No pain         Complications: No apparent anesthesia complications

## 2019-03-19 NOTE — Discharge Instructions (Signed)
YOU HAD AN ENDOSCOPIC PROCEDURE TODAY: Refer to the procedure report and other information in the discharge instructions given to you for any specific questions about what was found during the examination. If this information does not answer your questions, please call [place] office at [phone] to clarify.  ° °YOU SHOULD EXPECT: Some feelings of bloating in the abdomen. Passage of more gas than usual. Walking can help get rid of the air that was put into your GI tract during the procedure and reduce the bloating. If you had a lower endoscopy (such as a colonoscopy or flexible sigmoidoscopy) you may notice spotting of blood in your stool or on the toilet paper. Some abdominal soreness may be present for a day or two, also. ° °DIET: Your first meal following the procedure should be a light meal and then it is ok to progress to your normal diet. A half-sandwich or bowl of soup is an example of a good first meal. Heavy or fried foods are harder to digest and may make you feel nauseous or bloated. Drink plenty of fluids but you should avoid alcoholic beverages for 24 hours. If you had a esophageal dilation, please see attached instructions for diet.   ° °ACTIVITY: Your care partner should take you home directly after the procedure. You should plan to take it easy, moving slowly for the rest of the day. You can resume normal activity the day after the procedure however YOU SHOULD NOT DRIVE, use power tools, machinery or perform tasks that involve climbing or major physical exertion for 24 hours (because of the sedation medicines used during the test).  ° °SYMPTOMS TO REPORT IMMEDIATELY: °A gastroenterologist can be reached at any hour. Please call [phone]  for any of the following symptoms:  °Following lower endoscopy (colonoscopy, flexible sigmoidoscopy) °Excessive amounts of blood in the stool  °Significant tenderness, worsening of abdominal pains  °Swelling of the abdomen that is new, acute  °Fever of 100° or  higher  °Following upper endoscopy (EGD, EUS, ERCP, esophageal dilation) °Vomiting of blood or coffee ground material  °New, significant abdominal pain  °New, significant chest pain or pain under the shoulder blades  °Painful or persistently difficult swallowing  °New shortness of breath  °Black, tarry-looking or red, bloody stools ° °FOLLOW UP:  °If any biopsies were taken you will be contacted by phone or by letter within the next 1-3 weeks. Call [phone]  if you have not heard about the biopsies in 3 weeks.  °Please also call with any specific questions about appointments or follow up tests. ° °

## 2019-03-19 NOTE — Anesthesia Postprocedure Evaluation (Signed)
Anesthesia Post Note  Patient: Crayton Savarese  Procedure(s) Performed: UPPER ENDOSCOPIC ULTRASOUND (EUS) RADIAL (N/A ) ESOPHAGOGASTRODUODENOSCOPY (EGD) WITH PROPOFOL (N/A ) SUBMUCOSAL INJECTION     Patient location during evaluation: PACU Anesthesia Type: MAC Level of consciousness: awake and alert Pain management: pain level controlled Vital Signs Assessment: post-procedure vital signs reviewed and stable Respiratory status: spontaneous breathing, nonlabored ventilation, respiratory function stable and patient connected to nasal cannula oxygen Cardiovascular status: stable and blood pressure returned to baseline Postop Assessment: no apparent nausea or vomiting Anesthetic complications: no    Last Vitals:  Vitals:   03/19/19 1500 03/19/19 1510  BP: 117/77 (!) 124/98  Pulse: (!) 56 (!) 57  Resp: 19 14  Temp:    SpO2: 100% 100%    Last Pain:  Vitals:   03/19/19 1510  TempSrc:   PainSc: 0-No pain                 Effie Berkshire

## 2019-03-19 NOTE — Interval H&P Note (Signed)
History and Physical Interval Note:  03/19/2019 1:48 PM  [name]  has presented today for surgery, with the diagnosis of staging gastric cancer.  The various methods of treatment have been discussed with the patient and family. After consideration of risks, benefits and other options for treatment, the patient has consented to  Procedure(s): UPPER ENDOSCOPIC ULTRASOUND (EUS) RADIAL (N/A) ESOPHAGOGASTRODUODENOSCOPY (EGD) WITH PROPOFOL (N/A) as a surgical intervention.  The patient's history has been reviewed, patient examined, no change in status, stable for surgery.  I have reviewed the patient's chart and labs.  Questions were answered to the patient's satisfaction.     [name]

## 2019-03-19 NOTE — Anesthesia Preprocedure Evaluation (Addendum)
Anesthesia Evaluation  Patient identified by MRN, date of birth, ID band Patient awake    Reviewed: Allergy & Precautions, NPO status , Patient's Chart, lab work & pertinent test results  Airway Mallampati: II  TM Distance: >3 FB   Mouth opening: Pediatric Airway  Dental  (+) Poor Dentition, Dental Advisory Given   Pulmonary asthma , Current Smoker,    Pulmonary exam normal        Cardiovascular negative cardio ROS   Rhythm:Regular Rate:Normal     Neuro/Psych Seizures -,  PSYCHIATRIC DISORDERS Anxiety Depression    GI/Hepatic Neg liver ROS, GERD  Medicated,  Endo/Other  negative endocrine ROS  Renal/GU negative Renal ROS     Musculoskeletal negative musculoskeletal ROS (+)   Abdominal Normal abdominal exam  (+)   Peds  Hematology negative hematology ROS (+)   Anesthesia Other Findings   Reproductive/Obstetrics                            Anesthesia Physical Anesthesia Plan  ASA: II  Anesthesia Plan: MAC   Post-op Pain Management:    Induction: Intravenous  PONV Risk Score and Plan: 0 and Propofol infusion  Airway Management Planned: Natural Airway  Additional Equipment: None  Intra-op Plan:   Post-operative Plan:   Informed Consent: I have reviewed the patients History and Physical, chart, labs and discussed the procedure including the risks, benefits and alternatives for the proposed anesthesia with the patient or authorized representative who has indicated his/her understanding and acceptance.       Plan Discussed with: CRNA  Anesthesia Plan Comments:        Anesthesia Quick Evaluation

## 2019-03-19 NOTE — Op Note (Signed)
[facility] Patient Name: [name] Procedure Date: 03/19/2019 MRN: [id number] Attending MD: [name] , MD Date of Birth: 1970-03-17 CSN: [id number] Age: 49 Admit Type: Outpatient Procedure:                Upper EUS Indications:              Staging of gastric adenocarcinoma, tattoo of the                            site Providers:                [name], MD, Elmer Ramp. Tilden Dome, RN, [employer], Technician, [employer], Technician, Dellie Catholic Referring MD:              Medicines:                Monitored Anesthesia Care Complications:            No immediate complications. Estimated blood loss:                            None. Estimated Blood Loss:     Estimated blood loss: none. Procedure:                Pre-Anesthesia Assessment:                           - Prior to the procedure, a History and Physical                            was performed, and patient medications and                            allergies were reviewed. The patient's tolerance of                            previous anesthesia was also reviewed. The risks                            and benefits of the procedure and the sedation                            options and risks were discussed with the patient.                            All questions were answered, and informed consent                            was obtained. Prior Anticoagulants: The patient has                            taken no previous anticoagulant or antiplatelet  agents. ASA Grade Assessment: II - A patient with                            mild systemic disease. After reviewing the risks                            and benefits, the patient was deemed in                            satisfactory condition to undergo the procedure.                           After obtaining informed consent, the endoscope was   passed under direct vision. Throughout the                            procedure, the patient's blood pressure, pulse, and                            oxygen saturations were monitored continuously. The                            [id number]) Olympus Radial EUS was                            introduced through the mouth, and advanced to the                            second part of duodenum. The [id number]                            Olympus gastroscope was introduced through the                            mouth, and advanced to the second part of duodenum.                            The upper EUS was accomplished without difficulty.                            The patient tolerated the procedure well. Scope In: Scope Out: Findings:      ENDOSCOPIC FINDING (radial echoendoscope and standard adult gastroscope)       1. Normal esophagus.      2. 2-3cm mucosal mass along the anterior wall, greater curve of the       stomach, 6cm from the pylorus. Following EUS evaluation the proximal       edge of this mass was labeled with several submucosal injections of Spot       (see images).      3. Normal duodenum.      EUS findings:      1. Difficult location for EUS evaluation. The mass noted above       correlated with a hypoechoic thickening of the mucosa and submucosa       layers of the gastric wall but  there was not involvement of the       muscularis priopria (uT1)      2. No perigastric adenopathy.      3. Limited evaluation of the pancreas, liver, spleen were all normal. Impression:               - uT1N0 gastric adenocarcinoma, approximately 2-3cm                            across, located 6cm from the pylorus along the                            anterior wall of the greater curvature. Following                            EUS evaluation the proximal edge of this mass was                            labeled with several submucosal injections of Spot                             (see images) to aid in surgical resection next week. Moderate Sedation:      Not Applicable - Patient had care per Anesthesia. Recommendation:           - Discharge patient to home (ambulatory). Procedure Code(s):        --- Professional ---                           [phone], Esophagogastroduodenoscopy, flexible,                            transoral; with biopsy, single or multiple Diagnosis Code(s):        --- Professional ---                           K31.89, Other diseases of stomach and duodenum                           C16.9, Malignant neoplasm of stomach, unspecified CPT copyright 2019 [employer] Medical [employer]. All rights reserved. The codes documented in this report are preliminary and upon coder review may  be revised to meet current compliance requirements. [name], MD 03/19/2019 3:15:32 PM This report has been signed electronically. Number of Addenda: 0

## 2019-03-20 ENCOUNTER — Encounter: Payer: Self-pay | Admitting: *Deleted

## 2019-03-21 ENCOUNTER — Other Ambulatory Visit (HOSPITAL_COMMUNITY)
Admission: RE | Admit: 2019-03-21 | Discharge: 2019-03-21 | Disposition: A | Discharge status: Home / Self Care | Payer: Medicare HMO | Source: Ambulatory Visit | Attending: General Surgery | Admitting: General Surgery

## 2019-03-21 DIAGNOSIS — Z20822 Contact with and (suspected) exposure to covid-19: Secondary | ICD-10-CM | POA: Insufficient documentation

## 2019-03-21 DIAGNOSIS — Z01812 Encounter for preprocedural laboratory examination: Secondary | ICD-10-CM | POA: Diagnosis not present

## 2019-03-21 LAB — SARS CORONAVIRUS 2 (TAT 6-24 HRS): SARS Coronavirus 2: NEGATIVE

## 2019-03-24 ENCOUNTER — Encounter (HOSPITAL_COMMUNITY): Payer: Self-pay | Admitting: General Surgery

## 2019-03-24 ENCOUNTER — Other Ambulatory Visit: Payer: Self-pay

## 2019-03-24 DIAGNOSIS — C169 Malignant neoplasm of stomach, unspecified: Secondary | ICD-10-CM | POA: Diagnosis present

## 2019-03-24 DIAGNOSIS — F17200 Nicotine dependence, unspecified, uncomplicated: Secondary | ICD-10-CM | POA: Diagnosis present

## 2019-03-24 DIAGNOSIS — F418 Other specified anxiety disorders: Secondary | ICD-10-CM | POA: Diagnosis not present

## 2019-03-24 DIAGNOSIS — Z79899 Other long term (current) drug therapy: Secondary | ICD-10-CM | POA: Diagnosis not present

## 2019-03-24 DIAGNOSIS — Z8261 Family history of arthritis: Secondary | ICD-10-CM | POA: Diagnosis not present

## 2019-03-24 DIAGNOSIS — K219 Gastro-esophageal reflux disease without esophagitis: Secondary | ICD-10-CM | POA: Diagnosis present

## 2019-03-24 DIAGNOSIS — K567 Ileus, unspecified: Secondary | ICD-10-CM | POA: Diagnosis not present

## 2019-03-24 DIAGNOSIS — D62 Acute posthemorrhagic anemia: Secondary | ICD-10-CM | POA: Diagnosis not present

## 2019-03-24 DIAGNOSIS — C162 Malignant neoplasm of body of stomach: Secondary | ICD-10-CM | POA: Diagnosis present

## 2019-03-24 DIAGNOSIS — Z886 Allergy status to analgesic agent status: Secondary | ICD-10-CM | POA: Diagnosis not present

## 2019-03-24 DIAGNOSIS — F1010 Alcohol abuse, uncomplicated: Secondary | ICD-10-CM | POA: Diagnosis present

## 2019-03-24 DIAGNOSIS — Z8249 Family history of ischemic heart disease and other diseases of the circulatory system: Secondary | ICD-10-CM | POA: Diagnosis not present

## 2019-03-24 DIAGNOSIS — G8918 Other acute postprocedural pain: Secondary | ICD-10-CM | POA: Diagnosis not present

## 2019-03-24 DIAGNOSIS — G4700 Insomnia, unspecified: Secondary | ICD-10-CM | POA: Diagnosis present

## 2019-03-24 DIAGNOSIS — Z800 Family history of malignant neoplasm of digestive organs: Secondary | ICD-10-CM | POA: Diagnosis not present

## 2019-03-24 DIAGNOSIS — Z809 Family history of malignant neoplasm, unspecified: Secondary | ICD-10-CM | POA: Diagnosis not present

## 2019-03-24 DIAGNOSIS — K861 Other chronic pancreatitis: Secondary | ICD-10-CM | POA: Diagnosis present

## 2019-03-24 DIAGNOSIS — Z885 Allergy status to narcotic agent status: Secondary | ICD-10-CM | POA: Diagnosis not present
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
## 2019-03-24 NOTE — Progress Notes (Addendum)
Robert. [name] denies chest pain or shortness of breath.  Covid test was negative on 03/21/2019.  I went over instructions for, including ERAS Diet. I typed instructions. Patient said he will come to the hospital to pick them up. [name]  picked up  3 Pre- Surgery Ensure, CHG shower soap and written instructions for both.

## 2019-03-24 NOTE — Pre-Procedure Instructions (Addendum)
[name]  03/24/2019      Your procedure is scheduled on Wednesday, February 11.  Report to [facility], Main Entrance or Entrance "A" at 6:30 AM    Call this number if you have problems the morning of surgery: [phone]  This is the pre op desk                 >>>>>         Tonight before you go to bed, Drink 2 bottles of Ensure  Pre- Surgery drinks.  (You may refrigerate if you chose) <<<<<  << Remember:  Do not eat after midnight.  You may drink clear liquids until 6:00 AM .  Clear liquids allowed are:    Water, Juice (non-citric and without pulp), Carbonated beverages, Clear Tea, Black Coffee only, Plain Jell-O only, Gatorade and Plain Popsicles only.            >>>>>Drink 3rd Ensure , before 6:00 AM, then no more liquids.<<<<<    Take these medicines the morning of surgery before 6:00 AM :   docusate sodium    doxycycline     loratadine (CLARITIN)    metroNIDAZOLE (FLAGYL)    omeprazole (PRILOSEC)   dicyclomine (BENTYL)   May take/ use if needed: tetrahydrozoline-zinc (VISINE-AC)  Do not take any more Vitamins until after surgery.  [place]- Preparing For Surgery  Before surgery, you can play an important role. Because skin is not sterile, your skin needs to be as free of germs as possible. You can reduce the number of germs on your skin by washing with CHG (chlorahexidine gluconate) Soap before surgery.  CHG is an antiseptic cleaner which kills germs and bonds with the skin to continue killing germs even after washing.    Oral Hygiene is also important to reduce your risk of infection.  Remember - BRUSH YOUR TEETH THE MORNING OF SURGERY WITH YOUR REGULAR TOOTHPASTE   Please follow these instructions carefully.   1. Shower the NIGHT BEFORE SURGERY and the MORNING OF SURGERY with CHG.   2. If you chose to wash your hair, wash your hair first as usual with your normal shampoo.  3. Wash your face and private area with the soap you use at  home, then rinse your hair and body thoroughly to remove the shampoo and soap.   4. Use CHG as you would any other liquid soap. You can apply CHG directly to the skin and wash gently with a scrungie or a clean washcloth.   5. Apply the CHG Soap to your body ONLY FROM THE NECK DOWN.  Do not use on open wounds or open sores. Avoid contact with your eyes, ears, mouth and genitals (private parts).   6. Wash thoroughly, paying special attention to the area where your surgery will be performed.  7. Thoroughly rinse your body with warm water from the neck down.  8. DO NOT shower/wash with your normal soap after using and rinsing off the CHG Soap.  9. Pat yourself dry with a CLEAN TOWEL.  10. Wear CLEAN PAJAMAS to bed the night before surgery, wear comfortable clothes the morning of surgery  11. Place CLEAN SHEETS on your bed the night of your first shower and DO NOT SLEEP WITH PETS.  Day of Surgery Shower as instructed above. Please wear clean clothes to the hospital/surgery center.   Remember to brush your teeth WITH YOUR REGULAR TOOTHPASTE.  Why do we ask you to  drink Ensure- Pre- Surgery Drink:  Enhanced Recovery after Surgery  Enhanced Recovery after Surgery is a protocol used to improve the stress on your body and your recovery after surgery.

## 2019-03-25 ENCOUNTER — Encounter (HOSPITAL_COMMUNITY)
Admission: RE | Disposition: A | Discharge status: Home / Self Care | Payer: Self-pay | Source: Home / Self Care | Attending: General Surgery

## 2019-03-25 ENCOUNTER — Encounter (HOSPITAL_COMMUNITY): Payer: Self-pay | Admitting: General Surgery

## 2019-03-25 ENCOUNTER — Inpatient Hospital Stay (HOSPITAL_COMMUNITY)
Admission: RE | Admit: 2019-03-25 | Discharge: 2019-03-30 | DRG: 327 | Disposition: A | Discharge status: Home / Self Care | Payer: Medicare HMO | Attending: General Surgery | Admitting: General Surgery

## 2019-03-25 ENCOUNTER — Other Ambulatory Visit: Payer: Self-pay

## 2019-03-25 ENCOUNTER — Inpatient Hospital Stay (HOSPITAL_COMMUNITY): Payer: Medicare HMO | Admitting: Certified Registered Nurse Anesthetist

## 2019-03-25 DIAGNOSIS — Z809 Family history of malignant neoplasm, unspecified: Secondary | ICD-10-CM | POA: Diagnosis not present

## 2019-03-25 DIAGNOSIS — Z8261 Family history of arthritis: Secondary | ICD-10-CM

## 2019-03-25 DIAGNOSIS — C169 Malignant neoplasm of stomach, unspecified: Secondary | ICD-10-CM | POA: Diagnosis present

## 2019-03-25 DIAGNOSIS — Z885 Allergy status to narcotic agent status: Secondary | ICD-10-CM

## 2019-03-25 DIAGNOSIS — G4700 Insomnia, unspecified: Secondary | ICD-10-CM | POA: Diagnosis present

## 2019-03-25 DIAGNOSIS — K219 Gastro-esophageal reflux disease without esophagitis: Secondary | ICD-10-CM | POA: Diagnosis present

## 2019-03-25 DIAGNOSIS — Z8249 Family history of ischemic heart disease and other diseases of the circulatory system: Secondary | ICD-10-CM | POA: Diagnosis not present

## 2019-03-25 DIAGNOSIS — Z800 Family history of malignant neoplasm of digestive organs: Secondary | ICD-10-CM | POA: Diagnosis not present

## 2019-03-25 DIAGNOSIS — Z79899 Other long term (current) drug therapy: Secondary | ICD-10-CM

## 2019-03-25 DIAGNOSIS — K567 Ileus, unspecified: Secondary | ICD-10-CM | POA: Diagnosis not present

## 2019-03-25 DIAGNOSIS — D62 Acute posthemorrhagic anemia: Secondary | ICD-10-CM | POA: Diagnosis not present

## 2019-03-25 DIAGNOSIS — F17200 Nicotine dependence, unspecified, uncomplicated: Secondary | ICD-10-CM | POA: Diagnosis present

## 2019-03-25 DIAGNOSIS — K861 Other chronic pancreatitis: Secondary | ICD-10-CM | POA: Diagnosis present

## 2019-03-25 DIAGNOSIS — Z8719 Personal history of other diseases of the digestive system: Secondary | ICD-10-CM

## 2019-03-25 DIAGNOSIS — Z886 Allergy status to analgesic agent status: Secondary | ICD-10-CM

## 2019-03-25 DIAGNOSIS — C162 Malignant neoplasm of body of stomach: Secondary | ICD-10-CM | POA: Diagnosis present

## 2019-03-25 DIAGNOSIS — Z87898 Personal history of other specified conditions: Secondary | ICD-10-CM

## 2019-03-25 DIAGNOSIS — F1011 Alcohol abuse, in remission: Secondary | ICD-10-CM | POA: Diagnosis present

## 2019-03-25 DIAGNOSIS — F1010 Alcohol abuse, uncomplicated: Secondary | ICD-10-CM | POA: Diagnosis present

## 2019-03-25 DIAGNOSIS — J45909 Unspecified asthma, uncomplicated: Secondary | ICD-10-CM | POA: Diagnosis present

## 2019-03-25 HISTORY — PX: LAPAROSCOPIC GASTRECTOMY: SHX5894

## 2019-03-25 HISTORY — PX: PARTIAL GASTRECTOMY: SHX6003

## 2019-03-25 HISTORY — PX: LYMPH NODE BIOPSY: SHX201

## 2019-03-25 HISTORY — PX: LAPAROSCOPY: SHX197

## 2019-03-25 LAB — CBC
HCT: 42.3 % (ref 39.0–52.0)
Hemoglobin: 13.7 g/dL (ref 13.0–17.0)
MCH: 30.4 pg (ref 26.0–34.0)
MCHC: 32.4 g/dL (ref 30.0–36.0)
MCV: 93.8 fL (ref 80.0–100.0)
Platelets: 181 10*3/uL (ref 150–400)
RBC: 4.51 MIL/uL (ref 4.22–5.81)
RDW: 13.9 % (ref 11.5–15.5)
WBC: 2.8 10*3/uL — ABNORMAL LOW (ref 4.0–10.5)
nRBC: 0 % (ref 0.0–0.2)

## 2019-03-25 LAB — COMPREHENSIVE METABOLIC PANEL
ALT: 18 U/L (ref 0–44)
AST: 24 U/L (ref 15–41)
Albumin: 3.7 g/dL (ref 3.5–5.0)
Alkaline Phosphatase: 58 U/L (ref 38–126)
Anion gap: 8 (ref 5–15)
BUN: 7 mg/dL (ref 6–20)
CO2: 29 mmol/L (ref 22–32)
Calcium: 8.9 mg/dL (ref 8.9–10.3)
Chloride: 103 mmol/L (ref 98–111)
Creatinine, Ser: 1.05 mg/dL (ref 0.61–1.24)
GFR calc Af Amer: >60 mL/min (ref >60)
GFR calc non Af Amer: >60 mL/min (ref >60)
Glucose, Bld: 76 mg/dL (ref 70–99)
Potassium: 3.6 mmol/L (ref 3.5–5.1)
Sodium: 140 mmol/L (ref 135–145)
Total Bilirubin: 0.5 mg/dL (ref 0.3–1.2)
Total Protein: 7 g/dL (ref 6.5–8.1)

## 2019-03-25 LAB — TYPE AND SCREEN
ABO/RH(D): O POS
Antibody Screen: NEGATIVE

## 2019-03-25 LAB — ABO/RH: ABO/RH(D): O POS

## 2019-03-25 SURGERY — LAPAROSCOPY, DIAGNOSTIC
Anesthesia: General

## 2019-03-25 MED ORDER — MORPHINE SULFATE 2 MG/ML IV SOLN
INTRAVENOUS | Status: AC
  Filled 2019-03-25: qty 30

## 2019-03-25 MED ORDER — ENSURE PRE-SURGERY PO LIQD: 592.0000 mL | Freq: Once | ORAL | Status: DC

## 2019-03-25 MED ORDER — FENTANYL CITRATE (PF) 250 MCG/5ML IJ SOLN
INTRAMUSCULAR | Status: DC | PRN
  Administered 2019-03-25 (×2): 50 ug via INTRAVENOUS
  Administered 2019-03-25: 25 ug via INTRAVENOUS
  Administered 2019-03-25: 50 ug via INTRAVENOUS
  Administered 2019-03-25: 25 ug via INTRAVENOUS
  Administered 2019-03-25: 50 ug via INTRAVENOUS
  Administered 2019-03-25: 150 ug via INTRAVENOUS
  Administered 2019-03-25 (×2): 50 ug via INTRAVENOUS

## 2019-03-25 MED ORDER — CEFAZOLIN SODIUM-DEXTROSE 2-4 GM/100ML-% IV SOLN
2.0000 g | Freq: Three times a day (TID) | INTRAVENOUS | Status: AC
  Filled 2019-03-25: qty 100

## 2019-03-25 MED ORDER — SUGAMMADEX SODIUM 200 MG/2ML IV SOLN
INTRAVENOUS | Status: DC | PRN
  Administered 2019-03-25: 200 mg via INTRAVENOUS

## 2019-03-25 MED ORDER — DIPHENHYDRAMINE HCL 12.5 MG/5ML PO ELIX: 12.5000 mg | ORAL_SOLUTION | Freq: Four times a day (QID) | ORAL | Status: DC | PRN

## 2019-03-25 MED ORDER — ROPIVACAINE HCL 5 MG/ML IJ SOLN
INTRAMUSCULAR | Status: DC | PRN
  Administered 2019-03-25: 40 mL via PERINEURAL

## 2019-03-25 MED ORDER — ACETAMINOPHEN 10 MG/ML IV SOLN
INTRAVENOUS | Status: AC
  Filled 2019-03-25: qty 100

## 2019-03-25 MED ORDER — CHLORHEXIDINE GLUCONATE CLOTH 2 % EX PADS: 6.0000 | MEDICATED_PAD | Freq: Once | CUTANEOUS | Status: DC

## 2019-03-25 MED ORDER — CEFAZOLIN SODIUM-DEXTROSE 2-4 GM/100ML-% IV SOLN
2.0000 g | INTRAVENOUS | Status: AC
  Administered 2019-03-25: 09:00:00 2 g via INTRAVENOUS
  Filled 2019-03-25: qty 100

## 2019-03-25 MED ORDER — DIPHENHYDRAMINE HCL 50 MG/ML IJ SOLN: 12.5000 mg | Freq: Four times a day (QID) | INTRAMUSCULAR | Status: DC | PRN

## 2019-03-25 MED ORDER — METHOCARBAMOL 1000 MG/10ML IJ SOLN
500.0000 mg | INTRAVENOUS | Status: AC
  Administered 2019-03-25: 14:00:00 500 mg via INTRAVENOUS
  Filled 2019-03-25: qty 5

## 2019-03-25 MED ORDER — BUPIVACAINE HCL (PF) 0.25 % IJ SOLN
INTRAMUSCULAR | Status: AC
  Filled 2019-03-25: qty 30

## 2019-03-25 MED ORDER — LIDOCAINE 2% (20 MG/ML) 5 ML SYRINGE
INTRAMUSCULAR | Status: DC | PRN
  Administered 2019-03-25: 60 mg via INTRAVENOUS

## 2019-03-25 MED ORDER — BUPIVACAINE HCL (PF) 0.25 % IJ SOLN
INTRAMUSCULAR | Status: DC | PRN
  Administered 2019-03-25: 10 mL

## 2019-03-25 MED ORDER — DEXAMETHASONE SODIUM PHOSPHATE 10 MG/ML IJ SOLN
INTRAMUSCULAR | Status: AC
  Filled 2019-03-25: qty 1

## 2019-03-25 MED ORDER — FENTANYL CITRATE (PF) 250 MCG/5ML IJ SOLN
INTRAMUSCULAR | Status: AC
  Filled 2019-03-25: qty 5

## 2019-03-25 MED ORDER — ROCURONIUM BROMIDE 10 MG/ML (PF) SYRINGE
PREFILLED_SYRINGE | INTRAVENOUS | Status: DC | PRN
  Administered 2019-03-25: 20 mg via INTRAVENOUS
  Administered 2019-03-25: 50 mg via INTRAVENOUS

## 2019-03-25 MED ORDER — LIDOCAINE 2% (20 MG/ML) 5 ML SYRINGE
INTRAMUSCULAR | Status: AC
  Filled 2019-03-25: qty 5

## 2019-03-25 MED ORDER — BUPIVACAINE ON-Q PAIN PUMP (FOR ORDER SET NO CHG)
INJECTION | Status: DC
  Filled 2019-03-25: qty 1

## 2019-03-25 MED ORDER — FENTANYL CITRATE (PF) 100 MCG/2ML IJ SOLN: 25.0000 ug | INTRAMUSCULAR | Status: DC | PRN

## 2019-03-25 MED ORDER — ENOXAPARIN SODIUM 40 MG/0.4ML ~~LOC~~ SOLN
40.0000 mg | SUBCUTANEOUS | Status: DC
  Administered 2019-03-26 – 2019-03-30 (×5): 40 mg via SUBCUTANEOUS
  Filled 2019-03-25 (×6): qty 0.4

## 2019-03-25 MED ORDER — PHENYLEPHRINE HCL-NACL 10-0.9 MG/250ML-% IV SOLN
INTRAVENOUS | Status: DC | PRN
  Administered 2019-03-25: 30 ug/min via INTRAVENOUS

## 2019-03-25 MED ORDER — ONDANSETRON HCL 4 MG/2ML IJ SOLN
INTRAMUSCULAR | Status: AC
  Filled 2019-03-25: qty 2

## 2019-03-25 MED ORDER — DEXAMETHASONE SODIUM PHOSPHATE 10 MG/ML IJ SOLN
INTRAMUSCULAR | Status: DC | PRN
  Administered 2019-03-25 (×2): 10 mg via INTRAVENOUS

## 2019-03-25 MED ORDER — METOPROLOL TARTRATE 5 MG/5ML IV SOLN: 5.0000 mg | Freq: Four times a day (QID) | INTRAVENOUS | Status: DC | PRN

## 2019-03-25 MED ORDER — ROCURONIUM BROMIDE 10 MG/ML (PF) SYRINGE
PREFILLED_SYRINGE | INTRAVENOUS | Status: AC
  Filled 2019-03-25: qty 10

## 2019-03-25 MED ORDER — SUCCINYLCHOLINE CHLORIDE 200 MG/10ML IV SOSY
PREFILLED_SYRINGE | INTRAVENOUS | Status: DC | PRN
  Administered 2019-03-25: 100 mg via INTRAVENOUS

## 2019-03-25 MED ORDER — SCOPOLAMINE 1 MG/3DAYS TD PT72
1.0000 | MEDICATED_PATCH | TRANSDERMAL | Status: DC
  Administered 2019-03-25: 08:00:00 1.5 mg via TRANSDERMAL
  Filled 2019-03-25: qty 1

## 2019-03-25 MED ORDER — LIDOCAINE-EPINEPHRINE 1 %-1:100000 IJ SOLN
INTRAMUSCULAR | Status: AC
  Filled 2019-03-25: qty 1

## 2019-03-25 MED ORDER — NALOXONE HCL 0.4 MG/ML IJ SOLN: 0.4000 mg | INTRAMUSCULAR | Status: DC | PRN

## 2019-03-25 MED ORDER — ENSURE PRE-SURGERY PO LIQD: 296.0000 mL | Freq: Once | ORAL | Status: DC

## 2019-03-25 MED ORDER — PROMETHAZINE HCL 25 MG/ML IJ SOLN: 6.2500 mg | INTRAMUSCULAR | Status: DC | PRN

## 2019-03-25 MED ORDER — ACETAMINOPHEN 10 MG/ML IV SOLN
1000.0000 mg | Freq: Four times a day (QID) | INTRAVENOUS | Status: AC
  Administered 2019-03-25 – 2019-03-26 (×4): 1000 mg via INTRAVENOUS
  Filled 2019-03-25 (×4): qty 100

## 2019-03-25 MED ORDER — METHOCARBAMOL 1000 MG/10ML IJ SOLN
500.0000 mg | Freq: Three times a day (TID) | INTRAVENOUS | Status: DC | PRN
  Administered 2019-03-25: 500 mg via INTRAVENOUS
  Filled 2019-03-25: qty 500
  Filled 2019-03-25: qty 5

## 2019-03-25 MED ORDER — MIDAZOLAM HCL 5 MG/5ML IJ SOLN
INTRAMUSCULAR | Status: DC | PRN
  Administered 2019-03-25: 2 mg via INTRAVENOUS

## 2019-03-25 MED ORDER — LIDOCAINE-EPINEPHRINE 1 %-1:100000 IJ SOLN
INTRAMUSCULAR | Status: DC | PRN
  Administered 2019-03-25: 10 mL

## 2019-03-25 MED ORDER — LACTATED RINGERS IV SOLN: INTRAVENOUS | Status: DC

## 2019-03-25 MED ORDER — ONDANSETRON HCL 4 MG/2ML IJ SOLN: 4.0000 mg | Freq: Four times a day (QID) | INTRAMUSCULAR | Status: DC | PRN

## 2019-03-25 MED ORDER — KCL IN DEXTROSE-NACL 20-5-0.45 MEQ/L-%-% IV SOLN
INTRAVENOUS | Status: DC
  Filled 2019-03-25 (×7): qty 1000

## 2019-03-25 MED ORDER — OXYCODONE HCL 5 MG/5ML PO SOLN: 5.0000 mg | Freq: Once | ORAL | Status: DC | PRN

## 2019-03-25 MED ORDER — SUCCINYLCHOLINE CHLORIDE 200 MG/10ML IV SOSY
PREFILLED_SYRINGE | INTRAVENOUS | Status: AC
  Filled 2019-03-25: qty 10

## 2019-03-25 MED ORDER — PHENYLEPHRINE 40 MCG/ML (10ML) SYRINGE FOR IV PUSH (FOR BLOOD PRESSURE SUPPORT)
PREFILLED_SYRINGE | INTRAVENOUS | Status: AC
  Filled 2019-03-25: qty 10

## 2019-03-25 MED ORDER — FLUTICASONE PROPIONATE 50 MCG/ACT NA SUSP
1.0000 | Freq: Every day | NASAL | Status: DC | PRN
  Filled 2019-03-25: qty 16

## 2019-03-25 MED ORDER — ACETAMINOPHEN 10 MG/ML IV SOLN
1000.0000 mg | Freq: Once | INTRAVENOUS | Status: AC
  Administered 2019-03-25: 14:00:00 1000 mg via INTRAVENOUS

## 2019-03-25 MED ORDER — PROCHLORPERAZINE EDISYLATE 10 MG/2ML IJ SOLN: 5.0000 mg | Freq: Four times a day (QID) | INTRAMUSCULAR | Status: DC | PRN

## 2019-03-25 MED ORDER — 0.9 % SODIUM CHLORIDE (POUR BTL) OPTIME
TOPICAL | Status: DC | PRN
  Administered 2019-03-25 (×2): 1000 mL

## 2019-03-25 MED ORDER — ONDANSETRON HCL 4 MG/2ML IJ SOLN
INTRAMUSCULAR | Status: DC | PRN
  Administered 2019-03-25: 4 mg via INTRAVENOUS

## 2019-03-25 MED ORDER — OXYCODONE HCL 5 MG PO TABS: 5.0000 mg | ORAL_TABLET | Freq: Once | ORAL | Status: DC | PRN

## 2019-03-25 MED ORDER — PHENYLEPHRINE 40 MCG/ML (10ML) SYRINGE FOR IV PUSH (FOR BLOOD PRESSURE SUPPORT)
PREFILLED_SYRINGE | INTRAVENOUS | Status: DC | PRN
  Administered 2019-03-25: 80 ug via INTRAVENOUS

## 2019-03-25 MED ORDER — LACTATED RINGERS IV SOLN: INTRAVENOUS | Status: DC | PRN

## 2019-03-25 MED ORDER — PROPOFOL 10 MG/ML IV BOLUS
INTRAVENOUS | Status: AC
  Filled 2019-03-25: qty 40

## 2019-03-25 MED ORDER — NAPHAZOLINE-GLYCERIN 0.012-0.2 % OP SOLN
1.0000 [drp] | Freq: Four times a day (QID) | OPHTHALMIC | Status: DC | PRN
  Filled 2019-03-25: qty 15

## 2019-03-25 MED ORDER — BUPIVACAINE 0.25 % ON-Q PUMP DUAL CATH 300 ML
300.0000 mL | INJECTION | Status: AC
  Administered 2019-03-25: 12:00:00 300 mL
  Filled 2019-03-25: qty 300

## 2019-03-25 MED ORDER — PROPOFOL 10 MG/ML IV BOLUS
INTRAVENOUS | Status: DC | PRN
  Administered 2019-03-25: 200 mg via INTRAVENOUS

## 2019-03-25 MED ORDER — MORPHINE SULFATE 2 MG/ML IV SOLN
INTRAVENOUS | Status: DC
  Administered 2019-03-25: 9 mg via INTRAVENOUS
  Administered 2019-03-25: 27 mg via INTRAVENOUS
  Administered 2019-03-25: 21 mg via INTRAVENOUS
  Administered 2019-03-26: 13.5 mg via INTRAVENOUS
  Administered 2019-03-26: 19.5 mg via INTRAVENOUS
  Administered 2019-03-26: 12 mg via INTRAVENOUS
  Administered 2019-03-26: 6 mg via INTRAVENOUS
  Administered 2019-03-26: 12 mg via INTRAVENOUS
  Administered 2019-03-27: 7.5 mg via INTRAVENOUS
  Administered 2019-03-27: 9 mg via INTRAVENOUS
  Administered 2019-03-27: 3 mg via INTRAVENOUS
  Administered 2019-03-27: 6 mg via INTRAVENOUS
  Administered 2019-03-27 (×2): 1.5 mg via INTRAVENOUS
  Administered 2019-03-28: 7.5 mg via INTRAVENOUS
  Administered 2019-03-28: 6 mg via INTRAVENOUS
  Administered 2019-03-28: 10.5 mg via INTRAVENOUS
  Administered 2019-03-28: 3 mg via INTRAVENOUS
  Administered 2019-03-28: 6 mg via INTRAVENOUS
  Administered 2019-03-28 – 2019-03-29 (×2): 4.5 mg via INTRAVENOUS
  Administered 2019-03-29: 9 mg via INTRAVENOUS
  Administered 2019-03-29: 10.5 mg via INTRAVENOUS
  Filled 2019-03-25 (×2): qty 30
  Filled 2019-03-25: qty 60
  Filled 2019-03-25: qty 30

## 2019-03-25 MED ORDER — PROCHLORPERAZINE MALEATE 10 MG PO TABS
10.0000 mg | ORAL_TABLET | Freq: Four times a day (QID) | ORAL | Status: DC | PRN
  Filled 2019-03-25: qty 1

## 2019-03-25 MED ORDER — SODIUM CHLORIDE 0.9% FLUSH: 9.0000 mL | INTRAVENOUS | Status: DC | PRN

## 2019-03-25 MED ORDER — MIDAZOLAM HCL 2 MG/2ML IJ SOLN
INTRAMUSCULAR | Status: AC
  Filled 2019-03-25: qty 2

## 2019-03-25 SURGICAL SUPPLY — 83 items
ADH SKN CLS APL DERMABOND .7 (GAUZE/BANDAGES/DRESSINGS) ×1
APL PRP STRL LF DISP 70% ISPRP (MISCELLANEOUS) ×1
BIOPATCH RED 1 DISK 7.0 (GAUZE/BANDAGES/DRESSINGS) IMPLANT
BLADE CLIPPER SURG (BLADE) IMPLANT
CANISTER SUCT 3000ML PPV (MISCELLANEOUS) ×3 IMPLANT
CATH KIT ON-Q SILVERSOAK 7.5 (CATHETERS) IMPLANT
CATH KIT ON-Q SILVERSOAK 7.5IN (CATHETERS) ×4 IMPLANT
CHLORAPREP W/TINT 26 (MISCELLANEOUS) ×3 IMPLANT
CLIP VESOCCLUDE LG 6/CT (CLIP) ×1 IMPLANT
CLIP VESOCCLUDE MED 24/CT (CLIP) ×1 IMPLANT
CLIP VESOLOCK LG 6/CT PURPLE (CLIP) ×1 IMPLANT
CLIP VESOLOCK MED 6/CT (CLIP) ×1 IMPLANT
CLIP VESOLOCK MED LG 6/CT (CLIP) ×1 IMPLANT
CLSR STERI-STRIP ANTIMIC 1/2X4 (GAUZE/BANDAGES/DRESSINGS) ×1 IMPLANT
COVER SURGICAL LIGHT HANDLE (MISCELLANEOUS) ×2 IMPLANT
COVER WAND RF STERILE (DRAPES) ×2 IMPLANT
DECANTER SPIKE VIAL GLASS SM (MISCELLANEOUS) ×4 IMPLANT
DERMABOND ADVANCED (GAUZE/BANDAGES/DRESSINGS) ×1
DERMABOND ADVANCED .7 DNX12 (GAUZE/BANDAGES/DRESSINGS) ×1 IMPLANT
DRAIN CHANNEL 19F RND (DRAIN) IMPLANT
DRAIN PENROSE 0.5X18 (DRAIN) IMPLANT
DRAPE LAPAROSCOPIC ABDOMINAL (DRAPES) ×2 IMPLANT
DRAPE UTILITY XL STRL (DRAPES) IMPLANT
DRAPE WARM FLUID 44X44 (DRAPES) ×3 IMPLANT
DRSG COVADERM 4X10 (GAUZE/BANDAGES/DRESSINGS) IMPLANT
DRSG COVADERM 4X14 (GAUZE/BANDAGES/DRESSINGS) ×1 IMPLANT
DRSG COVADERM 4X8 (GAUZE/BANDAGES/DRESSINGS) IMPLANT
DRSG TEGADERM 4X4.75 (GAUZE/BANDAGES/DRESSINGS) ×2 IMPLANT
ELECT BLADE 6.5 EXT (BLADE) ×2 IMPLANT
ELECT REM PT RETURN 9FT ADLT (ELECTROSURGICAL) ×2
ELECTRODE REM PT RTRN 9FT ADLT (ELECTROSURGICAL) ×2 IMPLANT
EVACUATOR SILICONE 100CC (DRAIN) IMPLANT
GAUZE SPONGE 4X4 12PLY STRL (GAUZE/BANDAGES/DRESSINGS) ×1 IMPLANT
GLOVE BIO SURGEON STRL SZ 6 (GLOVE) ×4 IMPLANT
GLOVE INDICATOR 6.5 STRL GRN (GLOVE) ×4 IMPLANT
GOWN STRL REUS W/ TWL LRG LVL3 (GOWN DISPOSABLE) ×4 IMPLANT
GOWN STRL REUS W/TWL 2XL LVL3 (GOWN DISPOSABLE) ×4 IMPLANT
GOWN STRL REUS W/TWL LRG LVL3 (GOWN DISPOSABLE) ×6
HANDLE SUCTION POOLE (INSTRUMENTS) IMPLANT
KIT BASIN OR (CUSTOM PROCEDURE TRAY) ×3 IMPLANT
KIT TUBE JEJUNAL 16FR (CATHETERS) IMPLANT
KIT TURNOVER KIT B (KITS) ×3 IMPLANT
L-HOOK LAP DISP 36CM (ELECTROSURGICAL)
LHOOK LAP DISP 36CM (ELECTROSURGICAL) ×1 IMPLANT
NS IRRIG 1000ML POUR BTL (IV SOLUTION) ×6 IMPLANT
PACK GENERAL/GYN (CUSTOM PROCEDURE TRAY) ×1 IMPLANT
PAD ARMBOARD 7.5X6 YLW CONV (MISCELLANEOUS) ×5 IMPLANT
PENCIL BUTTON HOLSTER BLD 10FT (ELECTRODE) ×1 IMPLANT
PENCIL SMOKE EVACUATOR (MISCELLANEOUS) ×2 IMPLANT
RELOAD PROXIMATE 75MM BLUE (ENDOMECHANICALS) ×4 IMPLANT
RELOAD STAPLE 75 3.8 BLU REG (ENDOMECHANICALS) IMPLANT
RELOAD STAPLER LINEAR PROX 30 (STAPLE) ×1 IMPLANT
SCISSORS LAP 5X35 DISP (ENDOMECHANICALS) IMPLANT
SET IRRIG TUBING LAPAROSCOPIC (IRRIGATION / IRRIGATOR) IMPLANT
SET TUBE SMOKE EVAC HIGH FLOW (TUBING) ×2 IMPLANT
SHEARS FOC LG CVD HARMONIC 17C (MISCELLANEOUS) ×1 IMPLANT
SLEEVE ENDOPATH XCEL 5M (ENDOMECHANICALS) ×2 IMPLANT
SPECIMEN JAR LARGE (MISCELLANEOUS) ×2 IMPLANT
SPONGE LAP 18X18 RF (DISPOSABLE) ×2 IMPLANT
STAPLE ECHEON FLEX 60 POW ENDO (STAPLE) IMPLANT
STAPLER PROXIMATE 75MM BLUE (STAPLE) ×1 IMPLANT
STAPLER RELOAD LINEAR PROX 30 (STAPLE) ×2
STAPLER RELOADABLE 30 BLU REG (STAPLE) IMPLANT
STAPLER VISISTAT 35W (STAPLE) ×2 IMPLANT
SUCTION POOLE HANDLE (INSTRUMENTS) ×2
SUT ETHILON 2 0 FS 18 (SUTURE) IMPLANT
SUT MNCRL AB 4-0 PS2 18 (SUTURE) ×1 IMPLANT
SUT PDS AB 1 TP1 96 (SUTURE) ×3 IMPLANT
SUT PDS AB 3-0 SH 27 (SUTURE) ×4 IMPLANT
SUT SILK 2 0 SH CR/8 (SUTURE) ×2 IMPLANT
SUT SILK 2 0 TIES 10X30 (SUTURE) ×3 IMPLANT
SUT SILK 3 0 SH CR/8 (SUTURE) ×2 IMPLANT
SUT SILK 3 0 TIES 10X30 (SUTURE) ×2 IMPLANT
SYR BULB IRRIGATION 50ML (SYRINGE) ×1 IMPLANT
TOWEL GREEN STERILE (TOWEL DISPOSABLE) ×3 IMPLANT
TOWEL GREEN STERILE FF (TOWEL DISPOSABLE) ×3 IMPLANT
TRAY FOLEY MTR SLVR 16FR STAT (SET/KITS/TRAYS/PACK) ×2 IMPLANT
TRAY LAPAROSCOPIC MC (CUSTOM PROCEDURE TRAY) ×2 IMPLANT
TROCAR XCEL BLUNT TIP 100MML (ENDOMECHANICALS) IMPLANT
TROCAR XCEL NON-BLD 11X100MML (ENDOMECHANICALS) IMPLANT
TROCAR XCEL NON-BLD 5MMX100MML (ENDOMECHANICALS) ×2 IMPLANT
TUNNELER SHEATH ON-Q 16GX12 DP (PAIN MANAGEMENT) ×1 IMPLANT
YANKAUER SUCT BULB TIP NO VENT (SUCTIONS) IMPLANT

## 2019-03-25 NOTE — Op Note (Signed)
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  DISPOSITION OF SPECIMEN:  PATHOLOGY  COUNTS:  YES  DICTATION: .Dragon Dictation  PLAN OF CARE: Admit to inpatient   PATIENT DISPOSITION:  PACU - hemodynamically stable.  FINDINGS:  No evidence of carcinomatosis, no mass, but tattooed ulcer along lesser curve in center of specimen.  Tattoo traveled to lymph multiple additional nodes, all tattooed nodes taken along with normal nodes .    EBL: 150 mL  PROCEDURE:    The patient was identified in the holding area and was taken to the OR where he was placed supine on the operating room table.  General anesthesia was induced.  Arms were tucked, and foley catheter was placed.  Abdomen was prepped and draped in sterile fashion.  Timeout was performed according to the surgical safety checklist.  When all was correct, we continued.    The patient was rotated to the left and placed into reverse trendelenburg position.  A 5 mm incision was made at the costal margin and a 5 mm Optiview trocar was placed under direct visualization.  Pneumoperitoneum was achieved to a pressure of 15 mm Hg.  The abdomen was examined with the camera and no evidence of carcinomatosis was seen.  An upper midline incision was made in the abdomen and the abdomen was then manually explored.  The stomach was mobile, and the pelvis and undersurface of the liver was palpated.  Again, no carcinomatosis was identified.    The Bookwalter was used to assist with visualization.  The stomach was mobilized.  The omentum was taken off the colon  and the lesser sac was entered.  The Harmonic was used to take down the gastroepiploics and the edge of the lesser curve and greater curve were exposed.  The harmonic was then used to dissect superiorly and inferiorly.  Once the duodenal bulb was identified, it was skeletonized.  Several nodes with tattoo pigment were near the pylorus, so they were skeletonized with the pylorus.  The TA30 was used to divide the duodenal bulb.   The stomach border was skeletonized around 3-4 cm above the tattoo and two blue loads of the GIA 75 mm stapler were used to divide the stomach. The specimen was marked and sent to pathology.  The staple line of the duodenum and the stomach were oversewn with 3-0 PDS suture.  The left gastric/common hepatic artery nodes were skeletonized and sent with the specimen.  Multiple additional nodes were dissected off the vessels.  The frozen section returned as negative margins.    The gastrojejunostomy was then performed.  This was antecolic and retrogastric.  The jejunum was secured to the posterior wall of the stomach with two 3-0 silk sutures.  The stomach and jejunum were opened, and the 75 mm stapler was used to create a stapled linear anastamosis. The NGT was advanced into the efferent limb of the jejunum, and the defect was closed with two 3-0 PDS sutures in connell fashion.  The NGT was secured.  Additional silk sutures were used as anti-tension sutures.    The abdomen was then irrigated.  A sponge count was performed.  The OnQ tunnelers were placed on either side of the incision in the preperitoneal space.  The fascia was then closed with two #1 looped PDS sutures.  The skin was irrigated and closed with staples.  The OnQ catheters were advanced into the tunneler sheaths and the sheaths were removed.  The abdomen was then cleaned, dried, and dressed with dry sterile dressings.    Needle, sponge, and instrument counts were correct x 2.  The patient was allowed to emerge from  anesthesia and taken to the PACU in stable condition.

## 2019-03-25 NOTE — Anesthesia Procedure Notes (Signed)
Anesthesia Regional Block: TAP block   Pre-Anesthetic Checklist: ,, timeout performed, Correct Patient, Correct Site, Correct Laterality, Correct Procedure, Correct Position, site marked, Risks and benefits discussed, pre-op evaluation,  At surgeon's request and post-op pain management  Laterality: Left and Right  Prep: Maximum Sterile Barrier Precautions used, chloraprep       Needles:  Injection technique: Single-shot  Needle Type: Echogenic Stimulator Needle     Needle Length: 9cm  Needle Gauge: 21     Additional Needles:   Narrative:  Start time: 03/25/2019 11:50 AM End time: 03/25/2019 12:00 PM Injection made incrementally with aspirations every 5 mL. Anesthesiologist: Pervis Hocking, DO  Additional Notes: 2% Lidocaine skin wheel.

## 2019-03-25 NOTE — Anesthesia Procedure Notes (Addendum)
Procedure Name: Intubation Date/Time: 03/25/2019 9:21 AM Performed by: Harden Mo, CRNA Pre-anesthesia Checklist: Patient identified, Emergency Drugs available, Suction available and Patient being monitored Patient Re-evaluated:Patient Re-evaluated prior to induction Oxygen Delivery Method: Circle System Utilized Preoxygenation: Pre-oxygenation with 100% oxygen Induction Type: IV induction, Rapid sequence and Cricoid Pressure applied Laryngoscope Size: Mac and 4 Grade View: Grade I Tube type: Oral Tube size: 7.0 mm Number of attempts: 1 Airway Equipment and Method: Stylet and Oral airway Placement Confirmation: ETT inserted through vocal cords under direct vision,  positive ETCO2 and breath sounds checked- equal and bilateral Secured at: 23 cm Tube secured with: Tape Dental Injury: Teeth and Oropharynx as per pre-operative assessment

## 2019-03-25 NOTE — Interval H&P Note (Signed)
History and Physical Interval Note:  03/25/2019 9:05 AM  [name]  has presented today for surgery, with the diagnosis of GASTRIC CANCER.  The various methods of treatment have been discussed with the patient and family. After consideration of risks, benefits and other options for treatment, the patient has consented to  Procedure(s): LAPAROSCOPY DIAGNOSTIC (N/A) PARTIAL GASTRECTOMY, POSSIBLE FEEDING TUBE PLACEMENT (N/A) as a surgical intervention.  The patient's history has been reviewed, patient examined, no change in status, stable for surgery.  I have reviewed the patient's chart and labs.  Questions were answered to the patient's satisfaction.     [name]

## 2019-03-25 NOTE — Transfer of Care (Signed)
Immediate Anesthesia Transfer of Care Note  Patient: [name]  Procedure(s) Performed: LAPAROSCOPY DIAGNOSTIC (N/A ) DISTAL SUBTOTAL GASTRECTOMY (N/A ) Gastric Lymph Node Biopsy (N/A )  Patient Location: PACU  Anesthesia Type:General and Regional  Level of Consciousness: awake, alert  and oriented  Airway & Oxygen Therapy: Patient Spontanous Breathing and Patient connected to face mask oxygen  Post-op Assessment: Report given to RN, Post -op Vital signs reviewed and stable and Patient moving all extremities X 4  Post vital signs: Reviewed and stable  Last Vitals:  Vitals Value Taken Time  BP 153/115 03/25/19 1236  Temp    Pulse 64 03/25/19 1236  Resp 8 03/25/19 1236  SpO2 100 % 03/25/19 1236  Vitals shown include unvalidated device data.  Last Pain:  Vitals:   03/25/19 0800  TempSrc:   PainSc: 6       Patients Stated Pain Goal: 5 ([id number])  Complications: No apparent anesthesia complications

## 2019-03-25 NOTE — Anesthesia Postprocedure Evaluation (Signed)
Anesthesia Post Note  Patient: [name]  Procedure(s) Performed: LAPAROSCOPY DIAGNOSTIC (N/A ) DISTAL SUBTOTAL GASTRECTOMY (N/A ) Gastric Lymph Node Biopsy (N/A )     Patient location during evaluation: PACU Anesthesia Type: General and Regional Level of consciousness: awake and alert, oriented and patient cooperative Pain management: pain level controlled Vital Signs Assessment: post-procedure vital signs reviewed and stable Respiratory status: spontaneous breathing, nonlabored ventilation and respiratory function stable Cardiovascular status: blood pressure returned to baseline and stable Postop Assessment: no apparent nausea or vomiting Anesthetic complications: no    Last Vitals:  Vitals:   03/25/19 1435 03/25/19 1450  BP: (!) 143/85 (!) 155/90  Pulse: 64 63  Resp: 10 (!) 9  Temp:    SpO2: 100% 100%    Last Pain:  Vitals:   03/25/19 1505  TempSrc:   PainSc: [place]

## 2019-03-25 NOTE — Anesthesia Preprocedure Evaluation (Signed)
Anesthesia Evaluation  Patient identified by MRN, date of birth, ID band Patient awake    Reviewed: Allergy & Precautions, NPO status , Patient's Chart, lab work & pertinent test results  Airway Mallampati: II  TM Distance: >3 FB Neck ROM: Full    Dental no notable dental hx.    Pulmonary Current Smoker,    Pulmonary exam normal breath sounds clear to auscultation       Cardiovascular negative cardio ROS Normal cardiovascular exam Rhythm:Regular Rate:Normal     Neuro/Psych PSYCHIATRIC DISORDERS Anxiety Depression Hx brain aneurysm s/p repair 2017- on keppra prophylactically, never had seizure per pt    GI/Hepatic GERD  ,(+)     substance abuse  marijuana use, Gastric ca   Endo/Other  negative endocrine ROS  Renal/GU negative Renal ROS  negative genitourinary   Musculoskeletal negative musculoskeletal ROS (+)   Abdominal   Peds  Hematology negative hematology ROS (+) hct 38   Anesthesia Other Findings   Reproductive/Obstetrics negative OB ROS                             Anesthesia Physical Anesthesia Plan  ASA: III  Anesthesia Plan: General   Post-op Pain Management:    Induction: Intravenous, Rapid sequence and Cricoid pressure planned  PONV Risk Score and Plan: 3 and Ondansetron, Dexamethasone, Midazolam and Treatment may vary due to age or medical condition  Airway Management Planned: Oral ETT  Additional Equipment: None  Intra-op Plan:   Post-operative Plan: Extubation in OR  Informed Consent: I have reviewed the patients History and Physical, chart, labs and discussed the procedure including the risks, benefits and alternatives for the proposed anesthesia with the patient or authorized representative who has indicated his/her understanding and acceptance.     Dental advisory given  Plan Discussed with: CRNA  Anesthesia Plan Comments:         Anesthesia  Quick Evaluation

## 2019-03-26 LAB — COMPREHENSIVE METABOLIC PANEL
ALT: 35 U/L (ref 0–44)
AST: 53 U/L — ABNORMAL HIGH (ref 15–41)
Albumin: 3.3 g/dL — ABNORMAL LOW (ref 3.5–5.0)
Alkaline Phosphatase: 42 U/L (ref 38–126)
Anion gap: 9 (ref 5–15)
BUN: 5 mg/dL — ABNORMAL LOW (ref 6–20)
CO2: 27 mmol/L (ref 22–32)
Calcium: 9 mg/dL (ref 8.9–10.3)
Chloride: 100 mmol/L (ref 98–111)
Creatinine, Ser: 0.86 mg/dL (ref 0.61–1.24)
GFR calc Af Amer: >60 mL/min (ref >60)
GFR calc non Af Amer: >60 mL/min (ref >60)
Glucose, Bld: 135 mg/dL — ABNORMAL HIGH (ref 70–99)
Potassium: 4.1 mmol/L (ref 3.5–5.1)
Sodium: 136 mmol/L (ref 135–145)
Total Bilirubin: 0.5 mg/dL (ref 0.3–1.2)
Total Protein: 6.2 g/dL — ABNORMAL LOW (ref 6.5–8.1)

## 2019-03-26 LAB — CBC
HCT: 37.8 % — ABNORMAL LOW (ref 39.0–52.0)
Hemoglobin: 12.6 g/dL — ABNORMAL LOW (ref 13.0–17.0)
MCH: 30.1 pg (ref 26.0–34.0)
MCHC: 33.3 g/dL (ref 30.0–36.0)
MCV: 90.4 fL (ref 80.0–100.0)
Platelets: 161 10*3/uL (ref 150–400)
RBC: 4.18 MIL/uL — ABNORMAL LOW (ref 4.22–5.81)
RDW: 13.7 % (ref 11.5–15.5)
WBC: 6.7 10*3/uL (ref 4.0–10.5)
nRBC: 0 % (ref 0.0–0.2)

## 2019-03-26 LAB — PHOSPHORUS: Phosphorus: 3 mg/dL (ref 2.5–4.6)

## 2019-03-26 LAB — MAGNESIUM: Magnesium: 1.9 mg/dL (ref 1.7–2.4)

## 2019-03-26 MED ORDER — ONDANSETRON HCL 8 MG PO TABS: 8.0000 mg | ORAL_TABLET | Freq: Three times a day (TID) | ORAL | 2 refills | Status: DC | PRN

## 2019-03-26 MED ORDER — OXYCODONE HCL 5 MG PO TABS: 5.0000 mg | ORAL_TABLET | Freq: Four times a day (QID) | ORAL | 0 refills | Status: DC | PRN

## 2019-03-26 MED ORDER — MENTHOL 3 MG MT LOZG
1.0000 | LOZENGE | OROMUCOSAL | Status: DC | PRN
  Filled 2019-03-26: qty 9

## 2019-03-26 MED ORDER — CHLORHEXIDINE GLUCONATE CLOTH 2 % EX PADS
6.0000 | MEDICATED_PAD | Freq: Every day | CUTANEOUS | Status: DC
  Administered 2019-03-29 – 2019-03-30 (×2): 6 via TOPICAL

## 2019-03-26 MED ORDER — METHOCARBAMOL 500 MG PO TABS: 500.0000 mg | ORAL_TABLET | Freq: Three times a day (TID) | ORAL | 1 refills | Status: DC | PRN

## 2019-03-26 NOTE — Progress Notes (Signed)
Pt NG tube came out accidentally when he rolled in the bed. Notified on call Dr Grandville Silos states just leave NG tube out since plan is d/c NG tube in morning per Dr. Barry Dienes notes.

## 2019-03-26 NOTE — Plan of Care (Signed)
  Problem: Education: Goal: Knowledge of General Education information will improve Description Including pain rating scale, medication(s)/side effects and non-pharmacologic comfort measures Outcome: Progressing   

## 2019-03-26 NOTE — Discharge Instructions (Signed)
[facility], [place] [phone]  ABDOMINAL SURGERY: POST OP INSTRUCTIONS  Always review your discharge instruction sheet given to you by the facility where your surgery was performed.  IF YOU HAVE DISABILITY OR FAMILY LEAVE FORMS, YOU MUST BRING THEM TO THE OFFICE FOR PROCESSING.  PLEASE DO NOT GIVE THEM TO YOUR DOCTOR.  1. A prescription for pain medication may be given to you upon discharge.  Take your pain medication as prescribed, if needed.  If narcotic pain medicine is not needed, then you may take acetaminophen (Tylenol) or ibuprofen (Advil) as needed. 2. Take your usually prescribed medications unless otherwise directed. 3. If you need a refill on your pain medication, please contact your pharmacy. They will contact our office to request authorization.  Prescriptions will not be filled after 5pm or on week-ends. 4. You should follow a light diet the first few days after arrival home, such as soup and crackers, pudding, etc.unless your doctor has advised otherwise. A high-fiber, low fat diet can be resumed as tolerated.   Be sure to include lots of fluids daily. Most patients will experience some swelling and bruising on the chest and neck area.  Ice packs will help.  Swelling and bruising can take several days to resolve 5. Most patients will experience some swelling and bruising in the area of the incision. Ice pack will help. Swelling and bruising can take several days to resolve..  6. It is common to experience some constipation if taking pain medication after surgery.  Increasing fluid intake and taking a stool softener will usually help or prevent this problem from occurring.  A mild laxative (Milk of Magnesia or Miralax) should be taken according to package directions if there are no bowel movements after 48 hours. 7.  You may have steri-strips (small skin tapes) in place directly over the incision.  These strips should be left on the skin for 10-14 days.  If your  surgeon used skin glue on the incision, you may shower in 48 hours.  The glue will flake off over the next 2-3 weeks.  Any sutures or staples will be removed at the office during your follow-up visit. You may find that a light gauze bandage over your incision may keep your staples from being rubbed or pulled. You may shower and replace the bandage daily. 8. ACTIVITIES:  You may resume regular (light) daily activities beginning the next day--such as daily self-care, walking, climbing stairs--gradually increasing activities as tolerated.  You may have sexual intercourse when it is comfortable.  Refrain from any heavy lifting or straining until approved by your doctor. a. You may drive when you no longer are taking prescription pain medication, you can comfortably wear a seatbelt, and you can safely maneuver your car and apply brakes b. Return to Work: __________8 weeks if applicable_________________________ 44. You should see your doctor in the office for a follow-up appointment approximately two weeks after your surgery.  Make sure that you call for this appointment within a day or two after you arrive home to insure a convenient appointment time. OTHER INSTRUCTIONS:  _____________________________________________________________ _____________________________________________________________  WHEN TO CALL YOUR DOCTOR: 1. Fever over 101.0 2. Inability to urinate 3. Nausea and/or vomiting 4. Extreme swelling or bruising 5. Continued bleeding from incision. 6. Increased pain, redness, or drainage from the incision. 7. Difficulty swallowing or breathing 8. Muscle cramping or spasms. 9. Numbness or tingling in hands or feet or around lips.  The clinic staff is  available to answer your questions during regular business hours.  Please don't hesitate to call and ask to speak to one of the nurses if you have concerns.  For further questions, please visit [email or web address]      Soft-Food  Eating Plan A soft-food eating plan includes foods that are safe and easy to chew and swallow. Your health care provider or dietitian can help you find foods and flavors that fit into this plan. Follow this plan until your health care provider or dietitian says it is safe to start eating other foods and food textures. What are tips for following this plan? General guidelines   Take small bites of food, or cut food into pieces about  inch or smaller. Bite-sized pieces of food are easier to chew and swallow.  Eat moist foods. Avoid overly dry foods.  Avoid foods that: ? Are difficult to swallow, such as dry, chunky, crispy, or sticky foods. ? Are difficult to chew, such as hard, tough, or stringy foods. ? Contain nuts, seeds, or fruits.  Follow instructions from your dietitian about the types of liquids that are safe for you to swallow. You may be allowed to have: ? Thick liquids only. This includes only liquids that are thicker than honey. ? Thin and thick liquids. This includes all beverages and foods that become liquid at room temperature.  To make thick liquids: ? Purchase a commercial liquid thickening powder. These are available at grocery stores and pharmacies. ? Mix the thickener into liquids according to instructions on the label. ? Purchase ready-made thickened liquids. ? Thicken soup by pureeing, straining to remove chunks, and adding flour, potato flakes, or corn starch. ? Add commercial thickener to foods that become liquid at room temperature, such as milk shakes, yogurt, ice cream, gelatin, and sherbet.  Ask your health care provider whether you need to take a fiber supplement. Cooking  Cook meats so they stay tender and moist. Use methods like braising, stewing, or baking in liquid.  Cook vegetables and fruit until they are soft enough to be mashed with a fork.  Peel soft, fresh fruits such as peaches, nectarines, and melons.  When making soup, make sure chunks of  meat and vegetables are smaller than  inch.  Reheat leftover foods slowly so that a tough crust does not form. What foods are allowed? The items listed below may not be a complete list. Talk with your dietitian about what dietary choices are best for you. Grains Breads, muffins, pancakes, or waffles moistened with syrup, jelly, or butter. Dry cereals well-moistened with milk. Moist, cooked cereals. Well-cooked pasta and rice. Vegetables All soft-cooked vegetables. Shredded lettuce. Fruits All canned and cooked fruits. Soft, peeled fresh fruits. Strawberries. Dairy Milk. Cream. Yogurt. Cottage cheese. Soft cheese without the rind. Meats and other protein foods Tender, moist ground meat, poultry, or fish. Meat cooked in gravy or sauces. Eggs. Sweets and desserts Ice cream. Milk shakes. Sherbet. Pudding. Fats and oils Butter. Margarine. Olive, canola, sunflower, and grapeseed oil. Smooth salad dressing. Smooth cream cheese. Mayonnaise. Gravy. What foods are not allowed? The items listed bemay not be a complete list. Talk with your dietitian about what dietary choices are best for you. Grains Coarse or dry cereals, such as bran, granola, and shredded wheat. Tough or chewy crusty breads, such as [place] bread or baguettes. Breads with nuts, seeds, or fruit. Vegetables All raw vegetables. Cooked corn. Cooked vegetables that are tough or stringy. Tough, crisp, fried potatoes and potato skins. Fruits Fresh  fruits with skins or seeds, or both, such as apples, pears, and grapes. Stringy, high-pulp fruits, such as papaya, pineapple, coconut, and mango. Fruit leather and all dried fruit. Dairy Yogurt with nuts or coconut. Meats and other protein foods Hard, dry sausages. Dry meat, poultry, or fish. Meats with gristle. Fish with bones. Fried meat or fish. Lunch meat and hotdogs. Nuts and seeds. Chunky peanut butter or other nut butters. Sweets and desserts Cakes or cookies that are very dry or  chewy. Desserts with dried fruit, nuts, or coconut. Fried pastries. Very rich pastries. Fats and oils Cream cheese with fruit or nuts. Salad dressings with seeds or chunks. Summary  A soft-food eating plan includes foods that are safe and easy to swallow. Generally, the foods should be soft enough to be mashed with a fork.  Avoid foods that are dry, hard to chew, crunchy, sticky, stringy, or crispy.  Ask your health care provider whether you need to thicken your liquids and if you need to take a fiber supplement. This information is not intended to replace advice given to you by your health care provider. Make sure you discuss any questions you have with your health care provider. Document Revised: 05/22/2018 Document Reviewed: 04/03/2016 Elsevier Patient Education  [place].

## 2019-03-26 NOTE — Progress Notes (Signed)
1 Day Post-Op   Subjective/Chief Complaint: Pt complains of pain, but states that his PCA has been working.  He used 15 mg in the last 4 hours.  He has not yet been out of bed.  He is hungry.     Objective: Vital signs in last 24 hours: Temp:  [97 F (36.1 C)-98.8 F (37.1 C)] 98.8 F (37.1 C) (02/11 0453) Pulse Rate:  [55-64] 55 (02/11 0453) Resp:  [8-16] 10 (02/11 0800) BP: (128-155)/(81-93) 128/81 (02/11 0453) SpO2:  [97 %-100 %] 98 % (02/11 0800)    Intake/Output from previous day: 02/10 0701 - 02/11 0700 In: 2574.9 [I.V.:2404.9; NG/GT:20; IV Piggyback:150] Out: 4775 [Urine:4625; Blood:150] Intake/Output this shift: No intake/output data recorded.  General appearance: alert, cooperative and mild distress Resp: breathing comfortably Cardio: regular rate and rhythm GI: soft, non distended.  OnQ in place.  dressing c/d/i.   Extremities: extremities normal, atraumatic, no cyanosis or edema  Lab Results:  Recent Labs    03/25/19 0751 03/26/19 0230  WBC 2.8* 6.7  HGB 13.7 12.6*  HCT 42.3 37.8*  PLT 181 161   BMET Recent Labs    03/25/19 0751 03/26/19 0230  NA 140 136  K 3.6 4.1  CL 103 100  CO2 29 27  GLUCOSE 76 135*  BUN 7 5*  CREATININE 1.05 0.86  CALCIUM 8.9 9.0   PT/INR No results for input(s): LABPROT, INR in the last 72 hours. ABG No results for input(s): PHART, HCO3 in the last 72 hours.  Invalid input(s): PCO2, PO2  Studies/Results: No results found.  Anti-infectives: Anti-infectives (From admission, onward)   Start     Dose/Rate Route Frequency Ordered Stop   03/25/19 1700  ceFAZolin (ANCEF) IVPB 2g/100 mL premix     2 g 200 mL/hr over 30 Minutes Intravenous Every 8 hours 03/25/19 1653 03/25/19 2159   03/25/19 0730  ceFAZolin (ANCEF) IVPB 2g/100 mL premix     2 g 200 mL/hr over 30 Minutes Intravenous On call to O.R. 03/25/19 [id number] 03/25/19 0935      Assessment/Plan: s/p Procedure(s): LAPAROSCOPY DIAGNOSTIC (N/A) DISTAL SUBTOTAL  GASTRECTOMY (N/A) Gastric Lymph Node Biopsy (N/A) d/c foley continue PCA  Plan d/c NGT tomorrow with sips of clears and ice chips tomorrow. Await pathology.  Gastric cancer, clinical staging cT0-2N0M0 Mild ABL anemia - follow. OOB today.       LOS: 1 day    [name] 03/26/2019

## 2019-03-27 LAB — CBC
HCT: 36.2 % — ABNORMAL LOW (ref 39.0–52.0)
Hemoglobin: 12.1 g/dL — ABNORMAL LOW (ref 13.0–17.0)
MCH: 30.1 pg (ref 26.0–34.0)
MCHC: 33.4 g/dL (ref 30.0–36.0)
MCV: 90 fL (ref 80.0–100.0)
Platelets: 144 10*3/uL — ABNORMAL LOW (ref 150–400)
RBC: 4.02 MIL/uL — ABNORMAL LOW (ref 4.22–5.81)
RDW: 13.7 % (ref 11.5–15.5)
WBC: 6 10*3/uL (ref 4.0–10.5)
nRBC: 0 % (ref 0.0–0.2)

## 2019-03-27 LAB — COMPREHENSIVE METABOLIC PANEL
ALT: 32 U/L (ref 0–44)
AST: 51 U/L — ABNORMAL HIGH (ref 15–41)
Albumin: 3.2 g/dL — ABNORMAL LOW (ref 3.5–5.0)
Alkaline Phosphatase: 42 U/L (ref 38–126)
Anion gap: 7 (ref 5–15)
BUN: 5 mg/dL — ABNORMAL LOW (ref 6–20)
CO2: 26 mmol/L (ref 22–32)
Calcium: 8.7 mg/dL — ABNORMAL LOW (ref 8.9–10.3)
Chloride: 101 mmol/L (ref 98–111)
Creatinine, Ser: 0.86 mg/dL (ref 0.61–1.24)
GFR calc Af Amer: >60 mL/min (ref >60)
GFR calc non Af Amer: >60 mL/min (ref >60)
Glucose, Bld: 110 mg/dL — ABNORMAL HIGH (ref 70–99)
Potassium: 3.8 mmol/L (ref 3.5–5.1)
Sodium: 134 mmol/L — ABNORMAL LOW (ref 135–145)
Total Bilirubin: 0.7 mg/dL (ref 0.3–1.2)
Total Protein: 6.2 g/dL — ABNORMAL LOW (ref 6.5–8.1)

## 2019-03-27 MED ORDER — ADULT MULTIVITAMIN W/MINERALS CH: 1.0000 | ORAL_TABLET | Freq: Every day | ORAL | Status: DC

## 2019-03-27 MED ORDER — BOOST / RESOURCE BREEZE PO LIQD CUSTOM
1.0000 | Freq: Three times a day (TID) | ORAL | Status: DC
  Administered 2019-03-27 – 2019-03-29 (×7): 1 via ORAL

## 2019-03-27 NOTE — Progress Notes (Addendum)
Initial Nutrition Assessment  DOCUMENTATION CODES:   Not applicable  INTERVENTION:   -Boost Breeze po TID, each supplement provides 250 kcal and 9 grams of protein -RD will follow for diet advancement and adjust supplement regimen as appropriate  NUTRITION DIAGNOSIS:   Increased nutrient needs related to post-op healing as evidenced by estimated needs.  GOAL:   Patient will meet greater than or equal to 90% of their needs  MONITOR:   PO intake, Supplement acceptance, Labs, Weight trends, Skin  REASON FOR ASSESSMENT:   Consult Assessment of nutrition requirement/status  ASSESSMENT:   Pt is a 49 yo M who is referred for consultation by Dr. [name] for new diagnosis of gastric cancer 02/2019.  He presented with epigastric pain.  He has history of H pylori in 2016 and 2017.  He had a gastric ulcer in 2016, and the repeat endo in 2017 showed that the ulcer had healed despite the continued presence of H pylori.  He was retreated and got better.  He had a screening colonoscopy in 2020 that showed a few tiny adenomatous polyps.  2018 had a negative RUQ u/s.  His pain has been worse at night and worse after eating.  He denies weight loss or blood in stools.  He does have family history of cancer.  Maternal grandfather had colon cancer, maternal uncle had colon and pancreatic cancer, maternal cousin had pancreatic cancer, and a paternal grandfather had colon cancer.  Pt admitted with gastric cancer.   2/10- s/p Procedure(s): LAPAROSCOPY DIAGNOSTIC (N/A) DISTAL SUBTOTAL GASTRECTOMY (N/A) Gastric Lymph Node Biopsy (N/A)  Spoke with pt at bedside, who was sleepy at time of visit. He reports he is happy to have his NGT out. He is about to try some clear liquids.   Pt reports good appetite PTA. He was consuming "3 square meals" daily (Breakfast: grits, eggs, and meat; Lunch and Dinner: meat, starch, and vegetable).   Pt shares that his UBW is around 180#. He thinks he has lost weight over  the past few weeks, but is unsure how much. Reviewed wt hx;, which reveals an 8.6% wt loss over the past year, which is not significant for time frame.   Discussed importance of good meal and supplement intake to promote healing. Discussed with pt component of clear liquid diet and rationale for diet. Also discussed potential for diet advancement (per MD likely advancement to fulls tomorrow).   Per MD notes, awaiting pathology.   Medications reviewed and include dextrose 5% and 0.45% NaCl with KCl 20 mEq/L infusion @ 75 ml/hr.   Labs reviewed.   NUTRITION - FOCUSED PHYSICAL EXAM:    Most Recent Value  Orbital Region  No depletion  Upper Arm Region  No depletion  Thoracic and Lumbar Region  No depletion  Buccal Region  No depletion  Temple Region  No depletion  Clavicle Bone Region  Mild depletion  Clavicle and Acromion Bone Region  No depletion  Scapular Bone Region  No depletion  Dorsal Hand  No depletion  Patellar Region  No depletion  Anterior Thigh Region  No depletion  Posterior Calf Region  No depletion  Edema (RD Assessment)  None  Hair  Reviewed  Eyes  Reviewed  Mouth  Reviewed  Skin  Reviewed  Nails  Reviewed       Diet Order:   Diet Order            Diet bariatric clear liquid Room service appropriate? Yes; Fluid consistency: Thin  Diet  effective now              EDUCATION NEEDS:   Education needs have been addressed  Skin:  Skin Assessment: Skin Integrity Issues: Skin Integrity Issues:: Incisions Incisions: closed abdomen  Last BM:  Unknown  Height:   Ht Readings from Last 1 Encounters:  03/25/19 6\' 2"  (1.88 m)    Weight:   Wt Readings from Last 1 Encounters:  03/25/19 77.1 kg    Ideal Body Weight:  86.4 kg  BMI:  Body mass index is 21.83 kg/m.  Estimated Nutritional Needs:   Kcal:  2300-2500  Protein:  115-130 grams  Fluid:  > 2.3 L    Loistine Chance, RD, LDN, [place] Registered Dietitian II Certified Diabetes Care and Education  Specialist Please refer to [facility] for RD and/or RD on-call/weekend/after hours pager

## 2019-03-27 NOTE — Progress Notes (Signed)
2 Days Post-Op   Subjective/Chief Complaint: NGT came out early AM when he rolled over in bed.  Did get OOB yesterday.  Is working on IS.   Objective: Vital signs in last 24 hours: Temp:  [98.7 F (37.1 C)-99.5 F (37.5 C)] 98.8 F (37.1 C) (02/12 0532) Pulse Rate:  [57-70] 70 (02/12 0532) Resp:  [10-16] 10 (02/12 0801) BP: (129-143)/(77-87) 130/77 (02/12 0532) SpO2:  [96 %-100 %] 99 % (02/12 0801)    Intake/Output from previous day: 02/11 0701 - 02/12 0700 In: 1103.2 [I.V.:1103.2] Out: 2450 [Urine:2400; Emesis/NG output:50] Intake/Output this shift: Total I/O In: -  Out: 300 [Urine:300]   General appearance: alert, cooperative and mild distress Resp: breathing comfortably Cardio: regular rate and rhythm GI: soft, non distended.  OnQ in place.  dressing c/d/i.   Extremities: extremities normal, atraumatic, no cyanosis or edema  Lab Results:  Recent Labs    03/26/19 0230 03/27/19 0416  WBC 6.7 6.0  HGB 12.6* 12.1*  HCT 37.8* 36.2*  PLT 161 144*   BMET Recent Labs    03/26/19 0230 03/27/19 0416  NA 136 134*  K 4.1 3.8  CL 100 101  CO2 27 26  GLUCOSE 135* 110*  BUN 5* 5*  CREATININE 0.86 0.86  CALCIUM 9.0 8.7*   PT/INR No results for input(s): LABPROT, INR in the last 72 hours. ABG No results for input(s): PHART, HCO3 in the last 72 hours.  Invalid input(s): PCO2, PO2  Studies/Results: No results found.  Anti-infectives: Anti-infectives (From admission, onward)   Start     Dose/Rate Route Frequency Ordered Stop   03/25/19 1700  ceFAZolin (ANCEF) IVPB 2g/100 mL premix     2 g 200 mL/hr over 30 Minutes Intravenous Every 8 hours 03/25/19 1653 03/25/19 2159   03/25/19 0730  ceFAZolin (ANCEF) IVPB 2g/100 mL premix     2 g 200 mL/hr over 30 Minutes Intravenous On call to O.R. 03/25/19 [id number] 03/25/19 0935      Assessment/Plan: s/p Procedure(s): LAPAROSCOPY DIAGNOSTIC (N/A) DISTAL SUBTOTAL GASTRECTOMY (N/A) Gastric Lymph Node Biopsy (N/A)  PCA  for pain control, OnQ.  Anticipate OnQ to come out tomorrow.  NGT out, bariatric clear liq  Await pathology.  Gastric cancer, clinical staging cT0-2N0M0 Mild ABL anemia - relatively stable. Pulmonary toilet SCDs/lovenox for VTE prophylaxis PPI for GI prophylaxis.    Would not transition to oral meds yet until we see if n/v with NGT out and starting low volumes of clear liq.     LOS: 2 days    [name] 03/27/2019

## 2019-03-28 LAB — CBC
HCT: 37.7 % — ABNORMAL LOW (ref 39.0–52.0)
Hemoglobin: 12.5 g/dL — ABNORMAL LOW (ref 13.0–17.0)
MCH: 30 pg (ref 26.0–34.0)
MCHC: 33.2 g/dL (ref 30.0–36.0)
MCV: 90.6 fL (ref 80.0–100.0)
Platelets: 146 10*3/uL — ABNORMAL LOW (ref 150–400)
RBC: 4.16 MIL/uL — ABNORMAL LOW (ref 4.22–5.81)
RDW: 13.6 % (ref 11.5–15.5)
WBC: 5.5 10*3/uL (ref 4.0–10.5)
nRBC: 0 % (ref 0.0–0.2)

## 2019-03-28 LAB — COMPREHENSIVE METABOLIC PANEL
ALT: 28 U/L (ref 0–44)
AST: 36 U/L (ref 15–41)
Albumin: 3.1 g/dL — ABNORMAL LOW (ref 3.5–5.0)
Alkaline Phosphatase: 43 U/L (ref 38–126)
Anion gap: 10 (ref 5–15)
BUN: <5 mg/dL — ABNORMAL LOW (ref 6–20)
CO2: 24 mmol/L (ref 22–32)
Calcium: 8.9 mg/dL (ref 8.9–10.3)
Chloride: 98 mmol/L (ref 98–111)
Creatinine, Ser: 0.85 mg/dL (ref 0.61–1.24)
GFR calc Af Amer: >60 mL/min (ref >60)
GFR calc non Af Amer: >60 mL/min (ref >60)
Glucose, Bld: 106 mg/dL — ABNORMAL HIGH (ref 70–99)
Potassium: 4.2 mmol/L (ref 3.5–5.1)
Sodium: 132 mmol/L — ABNORMAL LOW (ref 135–145)
Total Bilirubin: 0.9 mg/dL (ref 0.3–1.2)
Total Protein: 6.4 g/dL — ABNORMAL LOW (ref 6.5–8.1)

## 2019-03-28 MED ORDER — GUAIFENESIN-DM 100-10 MG/5ML PO SYRP: 10.0000 mL | ORAL_SOLUTION | ORAL | Status: DC | PRN

## 2019-03-28 MED ORDER — SODIUM CHLORIDE 0.9 % IV SOLN: INTRAVENOUS | Status: DC

## 2019-03-28 MED ORDER — GABAPENTIN 100 MG PO CAPS
200.0000 mg | ORAL_CAPSULE | Freq: Three times a day (TID) | ORAL | Status: DC
  Administered 2019-03-28 – 2019-03-30 (×8): 200 mg via ORAL
  Filled 2019-03-28 (×8): qty 2

## 2019-03-28 MED ORDER — HYDROCORTISONE (PERIANAL) 2.5 % EX CREA
1.0000 "application " | TOPICAL_CREAM | Freq: Four times a day (QID) | CUTANEOUS | Status: DC | PRN
  Filled 2019-03-28: qty 28.35

## 2019-03-28 MED ORDER — LACTATED RINGERS IV BOLUS: 1000.0000 mL | Freq: Three times a day (TID) | INTRAVENOUS | Status: AC | PRN

## 2019-03-28 MED ORDER — POLYETHYLENE GLYCOL 3350 17 G PO PACK
17.0000 g | PACK | Freq: Every day | ORAL | Status: DC
  Administered 2019-03-28 – 2019-03-30 (×3): 17 g via ORAL
  Filled 2019-03-28 (×2): qty 1

## 2019-03-28 MED ORDER — LORATADINE 10 MG PO TABS
10.0000 mg | ORAL_TABLET | Freq: Every day | ORAL | Status: DC
  Administered 2019-03-28 – 2019-03-30 (×3): 10 mg via ORAL
  Filled 2019-03-28 (×3): qty 1

## 2019-03-28 MED ORDER — METOCLOPRAMIDE HCL 5 MG/ML IJ SOLN: 5.0000 mg | Freq: Three times a day (TID) | INTRAMUSCULAR | Status: DC | PRN

## 2019-03-28 MED ORDER — MENTHOL 3 MG MT LOZG: 1.0000 | LOZENGE | OROMUCOSAL | Status: DC | PRN

## 2019-03-28 MED ORDER — ENALAPRILAT 1.25 MG/ML IV SOLN
0.6250 mg | Freq: Four times a day (QID) | INTRAVENOUS | Status: DC | PRN
  Filled 2019-03-28: qty 1

## 2019-03-28 MED ORDER — LEVETIRACETAM 500 MG PO TABS
500.0000 mg | ORAL_TABLET | Freq: Two times a day (BID) | ORAL | Status: DC
  Administered 2019-03-28 – 2019-03-30 (×5): 500 mg via ORAL
  Filled 2019-03-28 (×5): qty 1

## 2019-03-28 MED ORDER — FOLIC ACID 1 MG PO TABS
1.0000 mg | ORAL_TABLET | Freq: Every day | ORAL | Status: DC
  Administered 2019-03-28 – 2019-03-30 (×3): 1 mg via ORAL
  Filled 2019-03-28 (×3): qty 1

## 2019-03-28 MED ORDER — MAGIC MOUTHWASH
15.0000 mL | Freq: Four times a day (QID) | ORAL | Status: DC | PRN
  Filled 2019-03-28: qty 15

## 2019-03-28 MED ORDER — BISACODYL 10 MG RE SUPP
10.0000 mg | Freq: Every day | RECTAL | Status: DC
  Administered 2019-03-28: 09:00:00 10 mg via RECTAL
  Filled 2019-03-28 (×3): qty 1

## 2019-03-28 MED ORDER — THIAMINE HCL 100 MG PO TABS
100.0000 mg | ORAL_TABLET | Freq: Every day | ORAL | Status: DC
  Administered 2019-03-28 – 2019-03-30 (×3): 100 mg via ORAL
  Filled 2019-03-28 (×3): qty 1

## 2019-03-28 MED ORDER — HYDROCORTISONE 1 % EX CREA
1.0000 "application " | TOPICAL_CREAM | Freq: Three times a day (TID) | CUTANEOUS | Status: DC | PRN
  Filled 2019-03-28: qty 28

## 2019-03-28 MED ORDER — ALUM & MAG HYDROXIDE-SIMETH 200-200-20 MG/5ML PO SUSP: 30.0000 mL | Freq: Four times a day (QID) | ORAL | Status: DC | PRN

## 2019-03-28 MED ORDER — LIP MEDEX EX OINT
1.0000 "application " | TOPICAL_OINTMENT | Freq: Two times a day (BID) | CUTANEOUS | Status: DC
  Administered 2019-03-28 – 2019-03-30 (×4): 1 via TOPICAL
  Filled 2019-03-28: qty 7

## 2019-03-28 MED ORDER — PHENOL 1.4 % MT LIQD: 1.0000 | OROMUCOSAL | Status: DC | PRN

## 2019-03-28 NOTE — Plan of Care (Signed)
  Problem: Education: Goal: Knowledge of General Education information will improve Description Including pain rating scale, medication(s)/side effects and non-pharmacologic comfort measures Outcome: Progressing   

## 2019-03-28 NOTE — Progress Notes (Signed)
Had beeen refusing to go ambulate out in the hallway.  Stated that he gets up to the bathroom. Explained again to him the importance of ambulation post surgery. We will keep encouraging and attempt again to walk.

## 2019-03-28 NOTE — Progress Notes (Signed)
[name] [id number] [birth year]  CARE TEAM:  PCP: Shelby Mattocks, PA-C  Outpatient Care Team: Patient Care Team: Rodriguez-Southworth, Sandrea Matte as PCP - General (Internal Medicine)  Inpatient Treatment Team: Treatment Team: Attending Provider: [name], MD; Registered Nurse: Fransisco Hertz, RN; Registered Nurse: Candida Peeling, RN; Registered Nurse: Roselind Rily, RN; Technician: Gasper Sells, [place]; Technician: Raenette Rover, NT; Registered Nurse: Dagoberto Ligas, RN   Problem List:   Principal Problem:   Gastric cancer s/p distal gastrectomy 03/25/2019 Active Problems:   History of alcohol abuse   INSOMNIA, CHRONIC   Asthma   PANCREATITIS, CHRONIC   History of seizures   History of pancreatitis   3 Days Post-Op  03/25/2019  PRE-OPERATIVE DIAGNOSIS: 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  Apparently has allergies to many medications.  We will do gabapentin with Robaxin backup. -Minimize IV fluids -On-Q pump collapsed.  Removed.  Continue PCA today.  Try and transition off tomorrow. -Mild ABL anemia - relatively stable -PPI for GI prophylaxis.   -Resume Keppra for seizure prophylaxis -Folate and thiamine and multivitamin -VTE prophylaxis- SCDs, etc -mobilize as tolerated to help recovery  35 minutes spent in review, evaluation, examination, counseling, and coordination of care.  More than 50% of that time was spent in counseling.  03/28/2019    Subjective: (Chief complaint)  Tolerating sips of liquids. Nurse in room Preferring to stay in room and walk to bathroom only. Using PCA a fair amount   Objective:  Vital  signs:  Vitals:   03/28/19 0228 03/28/19 0312 03/28/19 0553 03/28/19 0611  BP: (!) 141/95   135/88  Pulse: 78   70  Resp: 13 12 11 12   Temp: 98 F (36.7 C)   98.8 F (37.1 C)  TempSrc: Oral   Oral  SpO2: 100% 100% 100% 100%  Weight:      Height:           Intake/Output   Yesterday:  02/12 0701 - 02/13 0700 In: 2001.7 [P.O.:150; I.V.:1851.7] Out: 2400 [Urine:2400] This shift:  No intake/output data recorded.  Bowel function:  Flatus: YES  BM:  No  Drain: (No drain)   Physical Exam:  General: Pt awake/alert in no acute distress Eyes: PERRL, normal EOM.  Sclera clear.  No icterus Neuro: CN II-XII intact w/o focal sensory/motor deficits. Lymph: No head/neck/groin lymphadenopathy Psych:  No delerium/psychosis/paranoia.  Stoic.  Answers questions minimally.  Oriented x 4 HENT: Normocephalic, Mucus membranes moist.  No thrush Neck: Supple, No tracheal deviation.  No obvious thyromegaly Chest: No pain to chest wall compression.  Good respiratory excursion.  No audible wheezing CV:  Pulses intact.  Regular rhythm.  No major extremity edema MS: Normal AROM mjr joints.  No obvious deformity  Abdomen: Soft.  Nondistended.  Mildly tender at incisions only.  No evidence of peritonitis.  No incarcerated hernias.  onQ pump collapse.  I removed catheters and dressing.  Incision clean dry and intact  Ext:  No deformity.  No mjr edema.  No cyanosis Skin: No petechiae / purpurea.  No major sores.  Warm and dry    Results:   Cultures: Recent Results (from the past 720 hour(s))  SARS CORONAVIRUS  2 (TAT 6-24 HRS) Nasopharyngeal Nasopharyngeal Swab     Status: None   Collection Time: 03/16/19 10:29 AM   Specimen: Nasopharyngeal Swab  Result Value Ref Range Status   SARS Coronavirus 2 NEGATIVE NEGATIVE Final    Comment: (NOTE) SARS-CoV-2 target nucleic acids are NOT DETECTED. The SARS-CoV-2 RNA is generally detectable in upper and lower respiratory specimens during the acute  phase of infection. Negative results do not preclude SARS-CoV-2 infection, do not rule out co-infections with other pathogens, and should not be used as the sole basis for treatment or other patient management decisions. Negative results must be combined with clinical observations, patient history, and epidemiological information. The expected result is Negative. Fact Sheet for Patients: SugarRoll.be Fact Sheet for Healthcare Providers: https://www.woods-mathews.com/ This test is not yet approved or cleared by the [place] FDA and  has been authorized for detection and/or diagnosis of SARS-CoV-2 by FDA under an Emergency Use Authorization (EUA). This EUA will remain  in effect (meaning this test can be used) for the duration of the COVID-19 declaration under Section 56 4(b)(1) of the Act, 21 U.S.C. section 360bbb-3(b)(1), unless the authorization is terminated or revoked sooner. Performed at [facility], [place] [address], [place], [place] [address]   SARS CORONAVIRUS 2 (TAT 6-24 HRS) Nasopharyngeal Nasopharyngeal Swab     Status: None   Collection Time: 03/21/19 10:46 AM   Specimen: Nasopharyngeal Swab  Result Value Ref Range Status   SARS Coronavirus 2 NEGATIVE NEGATIVE Final    Comment: (NOTE) SARS-CoV-2 target nucleic acids are NOT DETECTED. The SARS-CoV-2 RNA is generally detectable in upper and lower respiratory specimens during the acute phase of infection. Negative results do not preclude SARS-CoV-2 infection, do not rule out co-infections with other pathogens, and should not be used as the sole basis for treatment or other patient management decisions. Negative results must be combined with clinical observations, patient history, and epidemiological information. The expected result is Negative. Fact Sheet for Patients: SugarRoll.be Fact Sheet for Healthcare  Providers: https://www.woods-mathews.com/ This test is not yet approved or cleared by the [place] FDA and  has been authorized for detection and/or diagnosis of SARS-CoV-2 by FDA under an Emergency Use Authorization (EUA). This EUA will remain  in effect (meaning this test can be used) for the duration of the COVID-19 declaration under Section 56 4(b)(1) of the Act, 21 U.S.C. section 360bbb-3(b)(1), unless the authorization is terminated or revoked sooner. Performed at [facility], [place] [address] [place], [place] [address]     Labs: Results for orders placed or performed during the hospital encounter of 03/25/19 (from the past 48 hour(s))  Comprehensive metabolic panel     Status: Abnormal   Collection Time: 03/27/19  4:16 AM  Result Value Ref Range   Sodium 134 (L) 135 - 145 mmol/L   Potassium 3.8 3.5 - 5.1 mmol/L   Chloride 101 98 - 111 mmol/L   CO2 26 22 - 32 mmol/L   Glucose, Bld 110 (H) 70 - 99 mg/dL   BUN 5 (L) 6 - 20 mg/dL   Creatinine, Ser 0.86 0.61 - 1.24 mg/dL   Calcium 8.7 (L) 8.9 - 10.3 mg/dL   Total Protein 6.2 (L) 6.5 - 8.1 g/dL   Albumin 3.2 (L) 3.5 - 5.0 g/dL   AST 51 (H) 15 - 41 U/L   ALT 32 0 - 44 U/L   Alkaline Phosphatase 42 38 - 126 U/L   Total Bilirubin 0.7 0.3 - 1.2  mg/dL   GFR calc non Af Amer >60 >60 mL/min   GFR calc Af Amer >60 >60 mL/min   Anion gap 7 5 - 15    Comment: Performed at [facility] [address], [place], [place] [address]  CBC     Status: Abnormal   Collection Time: 03/27/19  4:16 AM  Result Value Ref Range   WBC 6.0 4.0 - 10.5 K/uL   RBC 4.02 (L) 4.22 - 5.81 MIL/uL   Hemoglobin 12.1 (L) 13.0 - 17.0 g/dL   HCT 36.2 (L) 39.0 - 52.0 %   MCV 90.0 80.0 - 100.0 fL   MCH 30.1 26.0 - 34.0 pg   MCHC 33.4 30.0 - 36.0 g/dL   RDW 13.7 11.5 - 15.5 %   Platelets 144 (L) 150 - 400 K/uL   nRBC 0.0 0.0 - 0.2 %    Comment: Performed at [facility], [place] [address] [place],  [address]   Comprehensive metabolic panel     Status: Abnormal   Collection Time: 03/28/19  5:33 AM  Result Value Ref Range   Sodium 132 (L) 135 - 145 mmol/L   Potassium 4.2 3.5 - 5.1 mmol/L   Chloride 98 98 - 111 mmol/L   CO2 24 22 - 32 mmol/L   Glucose, Bld 106 (H) 70 - 99 mg/dL   BUN <5 (L) 6 - 20 mg/dL   Creatinine, Ser 0.85 0.61 - 1.24 mg/dL   Calcium 8.9 8.9 - 10.3 mg/dL   Total Protein 6.4 (L) 6.5 - 8.1 g/dL   Albumin 3.1 (L) 3.5 - 5.0 g/dL   AST 36 15 - 41 U/L   ALT 28 0 - 44 U/L   Alkaline Phosphatase 43 38 - 126 U/L   Total Bilirubin 0.9 0.3 - 1.2 mg/dL   GFR calc non Af Amer >60 >60 mL/min   GFR calc Af Amer >60 >60 mL/min   Anion gap 10 5 - 15    Comment: Performed at [facility], [place] [address], [place], [place] [address]  CBC     Status: Abnormal   Collection Time: 03/28/19  5:33 AM  Result Value Ref Range   WBC 5.5 4.0 - 10.5 K/uL   RBC 4.16 (L) 4.22 - 5.81 MIL/uL   Hemoglobin 12.5 (L) 13.0 - 17.0 g/dL   HCT 37.7 (L) 39.0 - 52.0 %   MCV 90.6 80.0 - 100.0 fL   MCH 30.0 26.0 - 34.0 pg   MCHC 33.2 30.0 - 36.0 g/dL   RDW 13.6 11.5 - 15.5 %   Platelets 146 (L) 150 - 400 K/uL    Comment: REPEATED TO VERIFY   nRBC 0.0 0.0 - 0.2 %    Comment: Performed at [facility], [place] [address] [place],  [address]    Imaging / Studies: No results found.  Medications / Allergies: per chart  Antibiotics: Anti-infectives (From admission, onward)   Start     Dose/Rate Route Frequency Ordered Stop   03/25/19 1700  ceFAZolin (ANCEF) IVPB 2g/100 mL premix     2 g 200 mL/hr over 30 Minutes Intravenous Every 8 hours 03/25/19 1653 03/25/19 2159   03/25/19 0730  ceFAZolin (ANCEF) IVPB 2g/100 mL premix     2 g 200 mL/hr over 30 Minutes Intravenous On call to O.R. 03/25/19 0716 03/25/19 0935        Note: Portions of this report may have been transcribed using voice recognition software.  Every effort was made to ensure accuracy; however, inadvertent computerized  transcription errors may be present.   Any transcriptional errors that result from this process are unintentional.     Adin Hector, MD, FACS, MASCRS Gastrointestinal and Minimally Invasive Surgery    [address], [place], [place] [address] [phone] Main / Paging [phone] Fax Please see Amion for pager number, especial 5pm - 7am.

## 2019-03-29 LAB — COMPREHENSIVE METABOLIC PANEL
ALT: 34 U/L (ref 0–44)
AST: 45 U/L — ABNORMAL HIGH (ref 15–41)
Albumin: 3.1 g/dL — ABNORMAL LOW (ref 3.5–5.0)
Alkaline Phosphatase: 45 U/L (ref 38–126)
Anion gap: 13 (ref 5–15)
BUN: 7 mg/dL (ref 6–20)
CO2: 24 mmol/L (ref 22–32)
Calcium: 9 mg/dL (ref 8.9–10.3)
Chloride: 96 mmol/L — ABNORMAL LOW (ref 98–111)
Creatinine, Ser: 1.1 mg/dL (ref 0.61–1.24)
GFR calc Af Amer: >60 mL/min (ref >60)
GFR calc non Af Amer: >60 mL/min (ref >60)
Glucose, Bld: 137 mg/dL — ABNORMAL HIGH (ref 70–99)
Potassium: 3.8 mmol/L (ref 3.5–5.1)
Sodium: 133 mmol/L — ABNORMAL LOW (ref 135–145)
Total Bilirubin: 0.8 mg/dL (ref 0.3–1.2)
Total Protein: 6.4 g/dL — ABNORMAL LOW (ref 6.5–8.1)

## 2019-03-29 LAB — CBC
HCT: 40.4 % (ref 39.0–52.0)
Hemoglobin: 13.6 g/dL (ref 13.0–17.0)
MCH: 30.6 pg (ref 26.0–34.0)
MCHC: 33.7 g/dL (ref 30.0–36.0)
MCV: 90.8 fL (ref 80.0–100.0)
Platelets: 152 10*3/uL (ref 150–400)
RBC: 4.45 MIL/uL (ref 4.22–5.81)
RDW: 13.4 % (ref 11.5–15.5)
WBC: 6.5 10*3/uL (ref 4.0–10.5)
nRBC: 0 % (ref 0.0–0.2)

## 2019-03-29 MED ORDER — OXYCODONE HCL 5 MG PO TABS
5.0000 mg | ORAL_TABLET | ORAL | Status: DC | PRN
  Administered 2019-03-29 – 2019-03-30 (×5): 10 mg via ORAL
  Filled 2019-03-29 (×5): qty 2

## 2019-03-29 MED ORDER — MORPHINE SULFATE (PF) 4 MG/ML IV SOLN
4.0000 mg | INTRAVENOUS | Status: DC | PRN
  Administered 2019-03-29: 10:00:00 4 mg via INTRAVENOUS
  Filled 2019-03-29: qty 1

## 2019-03-29 MED ORDER — METHOCARBAMOL 500 MG PO TABS
1000.0000 mg | ORAL_TABLET | Freq: Three times a day (TID) | ORAL | Status: DC
  Administered 2019-03-29 – 2019-03-30 (×4): 1000 mg via ORAL
  Filled 2019-03-29 (×4): qty 2

## 2019-03-29 MED ORDER — MORPHINE SULFATE (PF) 4 MG/ML IV SOLN: 4.0000 mg | Freq: Four times a day (QID) | INTRAVENOUS | Status: DC | PRN

## 2019-03-29 MED ORDER — MORPHINE SULFATE (PF) 2 MG/ML IV SOLN
2.0000 mg | Freq: Three times a day (TID) | INTRAVENOUS | Status: DC | PRN
  Administered 2019-03-29: 2 mg via INTRAVENOUS
  Filled 2019-03-29: qty 1

## 2019-03-29 NOTE — Progress Notes (Addendum)
4 Days Post-Op   Subjective/Chief Complaint: Taking fulls OK Walked on unit Had BM   Objective: Vital signs in last 24 hours: Temp:  [98 F (36.7 C)-99.1 F (37.3 C)] 98.9 F (37.2 C) (02/14 0528) Pulse Rate:  [68-90] 90 (02/14 0528) Resp:  [11-20] 16 (02/14 0528) BP: (128-150)/(84-95) 128/86 (02/14 0528) SpO2:  [94 %-100 %] 100 % (02/14 0528) Last BM Date: (prior to admission)  Intake/Output from previous day: 02/13 0701 - 02/14 0700 In: 670.2 [P.O.:440; I.V.:180.2; IV Piggyback:50] Out: 850 [Urine:850] Intake/Output this shift: Total I/O In: -  Out: 500 [Urine:500]  General appearance: alert and cooperative Resp: clear to auscultation bilaterally Cardio: regular rate and rhythm GI: soft, incision CDI, +BS  Lab Results:  Recent Labs    03/28/19 0533 03/29/19 0246  WBC 5.5 6.5  HGB 12.5* 13.6  HCT 37.7* 40.4  PLT 146* 152   BMET Recent Labs    03/28/19 0533 03/29/19 0246  NA 132* 133*  K 4.2 3.8  CL 98 96*  CO2 24 24  GLUCOSE 106* 137*  BUN <5* 7  CREATININE 0.85 1.10  CALCIUM 8.9 9.0   PT/INR No results for input(s): LABPROT, INR in the last 72 hours. ABG No results for input(s): PHART, HCO3 in the last 72 hours.  Invalid input(s): PCO2, PO2  Studies/Results: No results found.  Anti-infectives: Anti-infectives (From admission, onward)   Start     Dose/Rate Route Frequency Ordered Stop   03/25/19 1700  ceFAZolin (ANCEF) IVPB 2g/100 mL premix     2 g 200 mL/hr over 30 Minutes Intravenous Every 8 hours 03/25/19 1653 03/25/19 2159   03/25/19 0730  ceFAZolin (ANCEF) IVPB 2g/100 mL premix     2 g 200 mL/hr over 30 Minutes Intravenous On call to O.R. 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 Ambulate. Path pending  LOS: 4 days    [name] 03/29/2019

## 2019-03-30 LAB — COMPREHENSIVE METABOLIC PANEL
ALT: 35 U/L (ref 0–44)
AST: 30 U/L (ref 15–41)
Albumin: 2.8 g/dL — ABNORMAL LOW (ref 3.5–5.0)
Alkaline Phosphatase: 46 U/L (ref 38–126)
Anion gap: 10 (ref 5–15)
BUN: 8 mg/dL (ref 6–20)
CO2: 28 mmol/L (ref 22–32)
Calcium: 8.7 mg/dL — ABNORMAL LOW (ref 8.9–10.3)
Chloride: 96 mmol/L — ABNORMAL LOW (ref 98–111)
Creatinine, Ser: 0.97 mg/dL (ref 0.61–1.24)
GFR calc Af Amer: >60 mL/min (ref >60)
GFR calc non Af Amer: >60 mL/min (ref >60)
Glucose, Bld: 97 mg/dL (ref 70–99)
Potassium: 3.3 mmol/L — ABNORMAL LOW (ref 3.5–5.1)
Sodium: 134 mmol/L — ABNORMAL LOW (ref 135–145)
Total Bilirubin: 0.6 mg/dL (ref 0.3–1.2)
Total Protein: 6.1 g/dL — ABNORMAL LOW (ref 6.5–8.1)

## 2019-03-30 LAB — CBC
HCT: 36.3 % — ABNORMAL LOW (ref 39.0–52.0)
Hemoglobin: 12.1 g/dL — ABNORMAL LOW (ref 13.0–17.0)
MCH: 30.1 pg (ref 26.0–34.0)
MCHC: 33.3 g/dL (ref 30.0–36.0)
MCV: 90.3 fL (ref 80.0–100.0)
Platelets: 169 10*3/uL (ref 150–400)
RBC: 4.02 MIL/uL — ABNORMAL LOW (ref 4.22–5.81)
RDW: 13.3 % (ref 11.5–15.5)
WBC: 3.9 10*3/uL — ABNORMAL LOW (ref 4.0–10.5)
nRBC: 0 % (ref 0.0–0.2)

## 2019-03-30 MED ORDER — POTASSIUM CHLORIDE 20 MEQ/15ML (10%) PO SOLN
40.0000 meq | ORAL | Status: AC
  Administered 2019-03-30 (×2): 40 meq via ORAL
  Filled 2019-03-30 (×2): qty 30

## 2019-03-30 MED ORDER — ADULT MULTIVITAMIN W/MINERALS CH: 1.0000 | ORAL_TABLET | Freq: Every day | ORAL | Status: AC

## 2019-03-30 MED ORDER — ENSURE ENLIVE PO LIQD
237.0000 mL | Freq: Three times a day (TID) | ORAL | Status: DC
  Administered 2019-03-30 (×2): 237 mL via ORAL

## 2019-03-30 MED ORDER — ADULT MULTIVITAMIN W/MINERALS CH
1.0000 | ORAL_TABLET | Freq: Every day | ORAL | Status: DC
  Administered 2019-03-30: 10:00:00 1 via ORAL
  Filled 2019-03-30: qty 1

## 2019-03-30 NOTE — Progress Notes (Signed)
Patient discharged via private vehicle in stable condition. AVS reviewed with patient and patient verbalized understanding.

## 2019-03-30 NOTE — Progress Notes (Signed)
Trauma/Critical Care Follow Up Note  Subjective:    Overnight Issues: NAEON, +flatus and BM. Slept well. When asked how FLD was yesterday, responded "it wasn't enough".   Objective:  Vital signs for last 24 hours: Temp:  [97.9 F (36.6 C)-98.8 F (37.1 C)] 98.7 F (37.1 C) (02/15 0438) Pulse Rate:  [64-96] 64 (02/15 0438) Resp:  [16-20] 18 (02/15 0438) BP: (100-124)/(63-76) 114/67 (02/15 0438) SpO2:  [97 %-100 %] 100 % (02/15 0438)  Hemodynamic parameters for last 24 hours:    Intake/Output from previous day: 02/14 0701 - 02/15 0700 In: 1400 [P.O.:1400] Out: 3000 [Urine:3000]  Intake/Output this shift: Total I/O In: -  Out: 275 [Urine:275]  Vent settings for last 24 hours:    Physical Exam:  Gen: comfortable, no distress Neuro: non-focal exam HEENT: PERRL Neck: supple CV: RRR Pulm: unlabored breathing Abd: soft, incision c/d/i with staples GU: clear yellow urine, spont voids Extr: wwp, no edema   Results for orders placed or performed during the hospital encounter of 03/25/19 (from the past 24 hour(s))  Comprehensive metabolic panel     Status: Abnormal   Collection Time: 03/30/19  6:56 AM  Result Value Ref Range   Sodium 134 (L) 135 - 145 mmol/L   Potassium 3.3 (L) 3.5 - 5.1 mmol/L   Chloride 96 (L) 98 - 111 mmol/L   CO2 28 22 - 32 mmol/L   Glucose, Bld 97 70 - 99 mg/dL   BUN 8 6 - 20 mg/dL   Creatinine, Ser 0.97 0.61 - 1.24 mg/dL   Calcium 8.7 (L) 8.9 - 10.3 mg/dL   Total Protein 6.1 (L) 6.5 - 8.1 g/dL   Albumin 2.8 (L) 3.5 - 5.0 g/dL   AST 30 15 - 41 U/L   ALT 35 0 - 44 U/L   Alkaline Phosphatase 46 38 - 126 U/L   Total Bilirubin 0.6 0.3 - 1.2 mg/dL   GFR calc non Af Amer >60 >60 mL/min   GFR calc Af Amer >60 >60 mL/min   Anion gap 10 5 - 15  CBC     Status: Abnormal   Collection Time: 03/30/19  6:56 AM  Result Value Ref Range   WBC 3.9 (L) 4.0 - 10.5 K/uL   RBC 4.02 (L) 4.22 - 5.81 MIL/uL   Hemoglobin 12.1 (L) 13.0 - 17.0 g/dL   HCT 36.3  (L) 39.0 - 52.0 %   MCV 90.3 80.0 - 100.0 fL   MCH 30.1 26.0 - 34.0 pg   MCHC 33.3 30.0 - 36.0 g/dL   RDW 13.3 11.5 - 15.5 %   Platelets 169 150 - 400 K/uL   nRBC 0.0 0.0 - 0.2 %    Assessment & Plan:   Present on Admission: . Gastric cancer s/p distal gastrectomy 03/25/2019 . History of alcohol abuse . INSOMNIA, CHRONIC . Asthma . PANCREATITIS, CHRONIC    LOS: 5 days   Additional comments:I reviewed the patient's new clinical lab test results.    s/p Procedure(s): LAPAROSCOPY DIAGNOSTIC (N/A) DISTAL SUBTOTAL GASTRECTOMY (N/A) Gastric Lymph Node Biopsy (N/A) Surgery  03/25/2019  PRE-OPERATIVE DIAGNOSIS:adenocarcinoma of the body of the stomach  POST-OPERATIVE DIAGNOSIS: Same  PROCEDURE:  Diagnostic laparoscopy Distal subtotal gastrectomy  FEN - advance diet, replete K DVT - SCDs, LMWH Dispo -  Home this PM if not requiring IV pain meds and tolerating diet vs tomorrow. Path still pending.   Jesusita Oka, MD Trauma & General Surgery Please use [email or web address] to contact on call provider  03/30/2019  *Care during the described time interval was provided by me. I have reviewed this patient's available data, including medical history, events of note, physical examination and test results as part of my evaluation.

## 2019-03-30 NOTE — Progress Notes (Signed)
Nutrition Follow-up  RD working remotely.  DOCUMENTATION CODES:   Not applicable  INTERVENTION:   -D/c Boost Breeze po TID, each supplement provides 250 kcal and 9 grams of protein -Magic cup TID with meals, each supplement provides 290 kcal and 9 grams of protein -Ensure Enlive po TID, each supplement provides 350 kcal and 20 grams of protein -MVI with minerals daily -RD will follow for diet advancement and adjust supplement regimen as appropriate  NUTRITION DIAGNOSIS:   Increased nutrient needs related to post-op healing as evidenced by estimated needs.  Ongoing  GOAL:   Patient will meet greater than or equal to 90% of their needs  Progressing   MONITOR:   PO intake, Supplement acceptance, Labs, Weight trends, Skin  REASON FOR ASSESSMENT:   Consult Assessment of nutrition requirement/status  ASSESSMENT:   Pt is a 49 yo M who is referred for consultation by Dr. [name] for new diagnosis of gastric cancer 02/2019.  He presented with epigastric pain.  He has history of H pylori in 2016 and 2017.  He had a gastric ulcer in 2016, and the repeat endo in 2017 showed that the ulcer had healed despite the continued presence of H pylori.  He was retreated and got better.  He had a screening colonoscopy in 2020 that showed a few tiny adenomatous polyps.  2018 had a negative RUQ u/s.  His pain has been worse at night and worse after eating.  He denies weight loss or blood in stools.  He does have family history of cancer.  Maternal grandfather had colon cancer, maternal uncle had colon and pancreatic cancer, maternal cousin had pancreatic cancer, and a paternal grandfather had colon cancer.  2/10- s/pProcedure(s): LAPAROSCOPY DIAGNOSTIC (N/A) DISTAL SUBTOTAL GASTRECTOMY (N/A) Gastric Lymph Node Biopsy (N/A) 2/12- NGT out, advanced to clears 2/13- advanced to fulls  Reviewed I/O's: -1.6 L x 24 hours and -5.5 L since admission  UOP: 3 L x 24 hours  Pt advanced to full liquids.  Noted poor meal intake; PO: 0-25%. Per [employer] meal ordering system, pt did not order any meals yesterday. He is consuming Boost Breeze supplements. Now that diet is advanced, RD will adjust supplement regimen to more nutrient dense supplements (Ensure Enlive and [employer]).  Taking Boost Breeze  Medications reviewed and include thiamine and folvite.   Labs reviewed: Na: 134, K: 3.3.   Diet Order:   Diet Order            Diet bariatric full liquid Room service appropriate? Yes; Fluid consistency: Thin  Diet effective now              EDUCATION NEEDS:   Education needs have been addressed  Skin:  Skin Assessment: Skin Integrity Issues: Skin Integrity Issues:: Incisions Incisions: closed abdomen  Last BM:  03/29/19  Height:   Ht Readings from Last 1 Encounters:  03/25/19 6\' 2"  (1.88 m)    Weight:   Wt Readings from Last 1 Encounters:  03/25/19 77.1 kg    Ideal Body Weight:  86.4 kg  BMI:  Body mass index is 21.83 kg/m.  Estimated Nutritional Needs:   Kcal:  2300-2500  Protein:  115-130 grams  Fluid:  > 2.3 L    Loistine Chance, RD, LDN, [place] Registered Dietitian II Certified Diabetes Care and Education Specialist Please refer to [facility] for RD and/or RD on-call/weekend/after hours pager

## 2019-03-30 NOTE — Plan of Care (Signed)
  Problem: Pain Managment: Goal: General experience of comfort will improve Outcome: Progressing   Problem: Safety: Goal: Ability to remain free from injury will improve Outcome: Progressing   Problem: Skin Integrity: Goal: Risk for impaired skin integrity will decrease Outcome: Progressing   

## 2019-03-31 ENCOUNTER — Other Ambulatory Visit: Payer: Self-pay

## 2019-04-01 ENCOUNTER — Other Ambulatory Visit: Payer: Self-pay | Admitting: Internal Medicine

## 2019-04-01 LAB — SURGICAL PATHOLOGY

## 2019-04-01 NOTE — Discharge Summary (Signed)
Patient ID: [name] [id number] 09/08/1970 49 y.o.  Admit date: 03/25/2019 Discharge date: 04/01/2019  Admitting Diagnosis: Gastric cancer   Discharge Diagnosis Patient Active Problem List   Diagnosis Date Noted  . Gastric cancer s/p distal gastrectomy 03/25/2019 03/25/2019  . Personal history of colonic polyps 01/29/2019  . History of pancreatitis 11/26/2017  . Hyponatremia   . Partial seizure ([place] [facility]) 03/05/2015  . History of seizures 03/05/2015  . Abdominal pain, chronic, epigastric 07/02/2014  . ABDOMINAL PAIN, GENERALIZED 03/07/2010  . KNEE PAIN, RIGHT 11/18/2009  . HYPERGLYCEMIA 09/08/2009  . ANKLE SPRAIN, LEFT 02/05/2009  . BACK STRAIN, LUMBAR 02/05/2009  . POLYURIA 10/28/2008  . ABDOMINAL PAIN, EPIGASTRIC 10/28/2008  . INSOMNIA, CHRONIC 10/10/2007  . DEPRESSION, CHRONIC 10/10/2007  . Asthma 10/10/2007  . WOUND INFECTION 10/10/2007  . History of alcohol abuse 04/15/2007  . 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 PANCREATITIS, CHRONIC 04/15/2007    Consultants None  Reason for Admission: Gastric cancer  Procedures LAPAROSCOPY DIAGNOSTIC (N/A) DISTAL SUBTOTAL GASTRECTOMY (N/A) Gastric Lymph Node Biopsy (N/A) Surgery2/11/2019  Hospital Course:  49 year old male underwent a diagnostic laparoscopy and distal subtotal gastrectomy with gastric lymph node biopsy for a diagnosis of gastric cancer. Postoperatively, he did well and was able to begin working with therapy and ambulate, he was initiated on a diet and was tolerating this well, his pain was well controlled with oral pain medications and he was voiding. He was felt to be stable for discharge.  Physical Exam: Gen: comfortable, no distress Neuro: non-focal exam HEENT: PERRL Neck: supple CV: RRR Pulm: unlabored breathing Abd: soft, incision c/d/i with staples GU: clear yellow urine, spont voids Extr: wwp, no edema  Allergies as of 03/30/2019      Reactions   Aspirin Anaphylaxis   Ibuprofen  Anaphylaxis   Hydrocodone Hives, Itching   Tylenol [acetaminophen]    Upset stomach      Medication List    STOP taking these medications   doxycycline 100 MG capsule Commonly known as: VIBRAMYCIN   metroNIDAZOLE 250 MG tablet Commonly known as: FLAGYL     TAKE these medications   B-12 1000 MCG Subl Place 1,000 mcg under the tongue daily.   bismuth subsalicylate 99991111 MG chewable tablet Commonly known as: Pepto-Bismol Chew 2 tablets (524 mg total) by mouth 4 (four) times daily.   [employer] Stomach Relief 262 MG Tabs Generic drug: Bismuth Subsalicylate Take 2 tablets by mouth 4 (four) times daily.   dicyclomine 20 MG tablet Commonly known as: BENTYL Take 1 tablet (20 mg total) by mouth 4 (four) times daily -  before meals and at bedtime. What changed:   how much to take  when to take this   docusate sodium 100 MG capsule Commonly known as: COLACE TAKE 1 CAPSULE BY MOUTH TWICE A DAY   fluticasone 50 MCG/ACT nasal spray Commonly known as: FLONASE Place 1 spray into both nostrils daily as needed for allergies or rhinitis.   folic acid 1 MG tablet Commonly known as: FOLVITE TAKE 1 TABLET BY MOUTH EVERY DAY   levETIRAcetam 500 MG tablet Commonly known as: Keppra Take 1 tablet (500 mg total) by mouth 2 (two) times daily.   loratadine 10 MG tablet Commonly known as: CLARITIN Take 1 tablet (10 mg total) by mouth daily.   methocarbamol 500 MG tablet Commonly known as: ROBAXIN Take 1 tablet (500 mg total) by mouth every 8 (eight) hours as needed for muscle spasms. What changed: when to take this  metoCLOPramide 10 MG tablet Commonly known as: REGLAN TAKE 1 TO 2 TABLETS BY MOUTH EVERY DAY AT BEDTIME What changed: See the new instructions.   multivitamin with minerals Tabs tablet Take 1 tablet by mouth daily. What changed: Another medication with the same name was added. Make sure you understand how and when to take each.   multivitamin with minerals Tabs  tablet Take 1 tablet by mouth daily. What changed: You were already taking a medication with the same name, and this prescription was added. Make sure you understand how and when to take each.   omeprazole 40 MG capsule Commonly known as: PRILOSEC TAKE 1 CAPSULE BY MOUTH EVERY DAY   omeprazole 20 MG capsule Commonly known as: PRILOSEC Take 1 capsule (20 mg total) by mouth 2 (two) times daily before a meal.   ondansetron 8 MG tablet Commonly known as: ZOFRAN Take 1 tablet (8 mg total) by mouth every 8 (eight) hours as needed for nausea or vomiting.   oxyCODONE 5 MG immediate release tablet Commonly known as: Oxy IR/ROXICODONE Take 1-2 tablets (5-10 mg total) by mouth every 6 (six) hours as needed.   tetrahydrozoline-zinc 0.05-0.25 % ophthalmic solution Commonly known as: VISINE-AC Place 2 drops into both eyes 2 (two) times daily as needed (Itching).   thiamine 100 MG tablet Commonly known as: VITAMIN B-1 TAKE 1 TABLET BY MOUTH EVERY DAY        Follow-up Information    [name], MD Follow up in 2 week(s).   Specialty: General Surgery Contact information: [address] [place] [address] [phone]            Signed: Jesusita Oka, [facility] 04/01/2019, 10:03 AM

## 2019-04-06 ENCOUNTER — Ambulatory Visit: Payer: Self-pay

## 2019-04-06 ENCOUNTER — Telehealth: Payer: Self-pay

## 2019-04-06 ENCOUNTER — Other Ambulatory Visit: Payer: Self-pay

## 2019-04-06 DIAGNOSIS — K861 Other chronic pancreatitis: Secondary | ICD-10-CM

## 2019-04-06 DIAGNOSIS — Z87898 Personal history of other specified conditions: Secondary | ICD-10-CM

## 2019-04-06 DIAGNOSIS — G4700 Insomnia, unspecified: Secondary | ICD-10-CM

## 2019-04-06 DIAGNOSIS — C169 Malignant neoplasm of stomach, unspecified: Secondary | ICD-10-CM

## 2019-04-06 NOTE — Chronic Care Management (AMB) (Addendum)
  Care Management   Note  04/06/2019 Name: [name] MRN: [id number] DOB: July 13, 1970   Received the following Red EMMI Alert from [place] [facility], [place], [place];   Name: [name] DOB: 02/13/1970; Age 49 Yesterday's Red Flags: 1   Assigned to [employer], RN w/ [facility]  GENERAL DISCHARGE FOR FOLLOW-UP MC-6N SURGICAL-GD    Encounter ID: [id number] Patient ID: [id number]    Series Access Code: [id number] Enrolled Date: 04/01/19     Call Language: English Start Date: 04/02/19     Details Sun 04/05/19 Thu 04/02/19     [phone] [id number] Day # 4 Day # 1     PATIENT GENERAL DISCHARGE FOR FOLLOW-UP: [place] GENERAL DISCHARGE FOR FOLLOW-UP: DISCHARGE, FOLLOW-UP, MEDS, WOUND CARE     Time of Last Call Attempt 1:03 PM 10:06 AM     Who reached Patient Patient     Lost interest in things? Yes       [facility] Program: PATIENT SATISFACTION: AFTER DISCHARGE EXPECTATIONS   Issued    PLAN:   A telephone outreach has been scheduled to the patient on 04/07/19 to assess for CC RN CM needs and offer enrollment into the program.   Barb Merino, RN, BSN, CCM Care Management Coordinator [place] [facility]  Direct Phone: [phone]

## 2019-04-07 ENCOUNTER — Telehealth: Payer: Self-pay

## 2019-04-07 ENCOUNTER — Ambulatory Visit (INDEPENDENT_AMBULATORY_CARE_PROVIDER_SITE_OTHER): Payer: Medicare HMO

## 2019-04-07 ENCOUNTER — Other Ambulatory Visit: Payer: Self-pay

## 2019-04-07 DIAGNOSIS — G8929 Other chronic pain: Secondary | ICD-10-CM | POA: Diagnosis not present

## 2019-04-07 DIAGNOSIS — J45909 Unspecified asthma, uncomplicated: Secondary | ICD-10-CM | POA: Diagnosis not present

## 2019-04-07 DIAGNOSIS — C169 Malignant neoplasm of stomach, unspecified: Secondary | ICD-10-CM | POA: Diagnosis not present

## 2019-04-08 ENCOUNTER — Other Ambulatory Visit: Payer: Self-pay

## 2019-04-08 ENCOUNTER — Telehealth: Payer: Self-pay | Admitting: Gastroenterology

## 2019-04-08 NOTE — Patient Instructions (Signed)
Visit Information  Goals Addressed      Patient Stated   . "I may need help affording my medications in the near future" (pt-stated)       CARE PLAN ENTRY (see longtitudinal plan of care for additional care plan information)  Current Barriers:  [name] Knowledge Deficits related to pharmacy resources available to assist with drug cost . Chronic Disease Management support and education needs related to Malignant Neoplasm of stomach, History of Seizures, Chronic Insomnia, Chronic pancreatitis  Nurse Case Manager Clinical Goal(s):  [name] Over the next 30 days, patient will collaborate with embedded Pharm D to discuss options for financial assistance to help with drug cost and affordability . Over the next 90 days, patient will demonstrate ongoing adherence to prescribed treatment plan for medication management as evidenced by patient will not run out of this medications or miss doses of his medications due to not being able to afford them  CCM RN CM Interventions:  04/07/19 call completed with patient  . Evaluation of current treatment plan related to Medication management including adherence and affordability  and patient's adherence to plan as established by provider. . Reviewed medications with patient and discussed patient may need resources to help with medication cost in the near future; patient is unable to verbalize the names of the medications he may need help with during this call  . Collaborated with embedded Pharm D Lottie Dawson  regarding patient's need for pharmacy resources to help with drug cost . Discussed plans with patient for ongoing care management follow up and provided patient with direct contact information for care management team . Pharmacy referral for assistance with resources that may help with drug cost due to patient reported expected hardship in the near future  Patient Self Care Activities:  . Self administers medications as prescribed . Attends all scheduled provider  appointments . Calls pharmacy for medication refills . Calls provider office for new concerns or questions  Initial goal documentation     . "to have no Seizures" (pt-stated)       CARE PLAN ENTRY (see longtitudinal plan of care for additional care plan information)  Current Barriers:  [name] Knowledge Deficits related to disease process and Self Health management of Seizures . Chronic Disease Management support and education needs related to History of Seizures, Malignant Neoplasm of stomach, Chronic Insomnia, Chronic pancreatitis  Nurse Case Manager Clinical Goal(s):  [name] Over the next 90 days, the patient will demonstrate ongoing self health care management ability as evidenced by patient will verbalize having no Seizure activity *  CCM RN CM Interventions:  04/07/19 spoke to patient  . Evaluation of current treatment plan related to Seizures  and patient's adherence to plan as established by provider . Reviewed medications with patient and discussed indication, dosage and frequency of his prescribed anti-seizure medication and patient reports understanding and adherence with no noted SE . Determined patient has not experienced any Seizure activity while taking Keppra as prescribed . Discussed plans with patient for ongoing care management follow up and provided patient with direct contact information for care management team . Provided patient with printed educational materials related to Importance of taking Seizure Medications as Scheduled   Patient Self Care Activities:  . Self administers medications as prescribed . Attends all scheduled provider appointments . Calls pharmacy for medication refills . Calls provider office for new concerns or questions   Initial goal documentation     . "to heal from stomack surgery" (pt-stated)  CARE PLAN ENTRY (see longtitudinal plan of care for additional care plan information)  Current Barriers:  [name] Knowledge Deficits related to  pathology results for removal of lymph nodes and ongoing treatment management of Malignant Neoplasm of stomach . Chronic Disease Management support and education needs related to Malignant Neoplasm of stomach, History of Seizures, Chronic Insomnia, Chronic Pancreatitis . Risk for Nutritional Imbalance   Nurse Case Manager Clinical Goal(s):  [name] Over the next 60 days, patient will verbalize understanding of plan for ongoing MD follow up and management of Malignant Neoplasm of stomach . Over the next 90 days, patient will verbalize complete healing from his partial gastrectomy without complications  . Over the next 90 days, patient will demonstrate diet progression as directed by MD w/o complications such as bowel obstruction, dehydration, malnutrition and or extreme weight loss   CCM RN CM Interventions:  04/07/19 spoke to patient  . Assessed for s/s suggestive of depression and or anxiety following recent diagnosis and surgery for Malignant Neoplasm of the stomach . Determined patient is not experiencing depression or anxiety at this time despite his recent change with his health . Evaluation of current treatment plan related to s/p distal Gastrectomy and patient's adherence to plan as established by provider . Determined patient has a left upper abdominal incision that is closed, dry and intact with staples in place, no dressing required . Advised patient to continue to monitor for wound healing by visually inspecting his abdominal wound daily . Discussed importance of notifying his GI surgeon promptly of any symptoms suggestive of infection including, drainage, redness, new or worsening pain at the surgical site, new or worsening swelling at the surgical site and or odor noted to the surgical site . Assessed for knowledge and understanding of post d/c instructions including diet, activity and medications . Reviewed medications with patient and discussed patient is adhering to his medication regimen  as directed and has all medications in his home; patient reports getting good effectiveness from his prescribed pain meds and muscle relaxer . Education provided related to when to call the doctor if he has a change in his condition . Assessed for toleration of soft diet, adequate hydration, nausea, vomiting, diarrhea and or new or worsening abdominal pain, patient denies having any of these symptoms, is tolerating soft diet well   . Discussed plans with patient for ongoing care management follow up and provided patient with direct contact information for care management team . Provided patient with printed educational materials related to Gastrectomy discharge instructions, 6 Tips to be Water Wise . Reviewed scheduled/upcoming provider appointments including: post surgical follow up at [facility] scheduled for 05/08/19 with Dr. Barry Dienes; next AWV with PCP provider Audery Amel, PA scheduled for 04/16/19 @ 2:15 PM   Patient Self Care Activities:  . Self administers medications as prescribed . Attends all scheduled provider appointments . Calls pharmacy for medication refills . Calls provider office for new concerns or questions  Initial goal documentation        Patient verbalizes understanding of instructions provided today.   Telephone follow up appointment with care management team member scheduled for: 05/11/19  Barb Merino, RN, BSN, CCM Care Management Coordinator [place] [facility]  Direct Phone: [phone]

## 2019-04-08 NOTE — Chronic Care Management (AMB) (Addendum)
Chronic Care Management   Initial Visit Note  04/07/2019 Name: [name] MRN: [id number] DOB: 06/30/1970  Referred by: Shelby Mattocks, PA-C Reason for referral : Care Coordination (EMMI Ref #1 Initial Call Attempt - post d/c )   [name] is a 49 y.o. year old male who is a primary care patient of [place], [place], [place]. The CCM team was consulted for assistance with chronic disease management and care coordination needs related to Malignant Neoplasm of stomach, History of Seizures, Asthma, Chronic pain   Review of patient status, including review of consultants reports, relevant laboratory and other test results, and collaboration with appropriate care team members and the patient's provider was performed as part of comprehensive patient evaluation and provision of chronic care management services.    SDOH (Social Determinants of Health) assessments performed: No See Care Plan activities for detailed interventions related to [place])    Placed initial CCM RN CM outbound call to patient to follow up on Red EMMI Alert and offer CCM services.   Medications: Outpatient Encounter Medications as of 04/07/2019  Medication Sig  . bismuth subsalicylate (PEPTO-BISMOL) 262 MG chewable tablet Chew 2 tablets (524 mg total) by mouth 4 (four) times daily.  [name] dicyclomine (BENTYL) 20 MG tablet Take 1 tablet (20 mg total) by mouth 4 (four) times daily -  before meals and at bedtime. (Patient taking differently: Take 100 mg by mouth 2 (two) times daily. )  . docusate sodium (COLACE) 100 MG capsule TAKE 1 CAPSULE BY MOUTH TWICE A DAY (Patient taking differently: Take 100 mg by mouth 2 (two) times daily. )  . levETIRAcetam (KEPPRA) 500 MG tablet Take 1 tablet (500 mg total) by mouth 2 (two) times daily.  . methocarbamol (ROBAXIN) 500 MG tablet Take 1 tablet (500 mg total) by mouth every 8 (eight) hours as needed for muscle spasms.  . metoCLOPramide (REGLAN) 10 MG tablet  TAKE 1 TO 2 TABLETS BY MOUTH EVERY DAY AT BEDTIME (Patient taking differently: Take 20 mg by mouth at bedtime. )  . omeprazole (PRILOSEC) 20 MG capsule Take 1 capsule (20 mg total) by mouth 2 (two) times daily before a meal.  . ondansetron (ZOFRAN) 8 MG tablet Take 1 tablet (8 mg total) by mouth every 8 (eight) hours as needed for nausea or vomiting.  [name] oxyCODONE (OXY IR/ROXICODONE) 5 MG immediate release tablet Take 1-2 tablets (5-10 mg total) by mouth every 6 (six) hours as needed.  . [employer] STOMACH RELIEF 262 MG TABS Take 2 tablets by mouth 4 (four) times daily.  . Cyanocobalamin (B-12) 1000 MCG SUBL Place 1,000 mcg under the tongue daily.  . fluticasone (FLONASE) 50 MCG/ACT nasal spray Place 1 spray into both nostrils daily as needed for allergies or rhinitis.  . folic acid (FOLVITE) 1 MG tablet TAKE 1 TABLET BY MOUTH EVERY DAY  . loratadine (CLARITIN) 10 MG tablet Take 1 tablet (10 mg total) by mouth daily.  . Multiple Vitamin (MULTIVITAMIN WITH MINERALS) TABS tablet Take 1 tablet by mouth daily.  . Multiple Vitamin (MULTIVITAMIN WITH MINERALS) TABS tablet Take 1 tablet by mouth daily.  [name] omeprazole (PRILOSEC) 40 MG capsule TAKE 1 CAPSULE BY MOUTH EVERY DAY (Patient not taking: Reported on 03/11/2019)  . tetrahydrozoline-zinc (VISINE-AC) 0.05-0.25 % ophthalmic solution Place 2 drops into both eyes 2 (two) times daily as needed (Itching).   . thiamine (VITAMIN B-1) 100 MG tablet TAKE 1 TABLET BY MOUTH EVERY DAY (Patient taking differently: Take 100 mg by mouth daily. )  No facility-administered encounter medications on file as of 04/07/2019.     Objective:  Lab Results  Component Value Date   HGBA1C 5.4 11/26/2017   HGBA1C 5.7 (H) 03/06/2015   Lab Results  Component Value Date   LDLCALC 169 (H) 12/12/2007   CREATININE 0.97 03/30/2019   BP Readings from Last 3 Encounters:  03/30/19 126/67  03/19/19 (!) 124/98  03/09/19 114/80    Goals Addressed      Patient Stated   . "I may need  help affording my medications in the near future" (pt-stated)       CARE PLAN ENTRY (see longtitudinal plan of care for additional care plan information)  Current Barriers:  [name] Knowledge Deficits related to pharmacy resources available to assist with drug cost . Chronic Disease Management support and education needs related to Malignant Neoplasm of stomach, History of Seizures, Asthma, Chronic pain   Nurse Case Manager Clinical Goal(s):  [name] Over the next 30 days, patient will collaborate with embedded Pharm D to discuss options for financial assistance to help with drug cost and affordability . Over the next 90 days, patient will demonstrate ongoing adherence to prescribed treatment plan for medication management as evidenced by patient will not run out of this medications or miss doses of his medications due to not being able to afford them  CCM RN CM Interventions:  04/07/19 call completed with patient  . Evaluation of current treatment plan related to Medication management including adherence and affordability  and patient's adherence to plan as established by provider. . Reviewed medications with patient and discussed patient may need resources to help with medication cost in the near future; patient is unable to verbalize the names of the medications he may need help with during this call  . Collaborated with embedded Pharm D Lottie Dawson  regarding patient's need for pharmacy resources to help with drug cost . Discussed plans with patient for ongoing care management follow up and provided patient with direct contact information for care management team . Pharmacy referral for assistance with resources that may help with drug cost due to patient reported expected hardship in the near future  Patient Self Care Activities:  . Self administers medications as prescribed . Attends all scheduled provider appointments . Calls pharmacy for medication refills . Calls provider office for new concerns  or questions  Initial goal documentation     . "to have no Seizures" (pt-stated)       CARE PLAN ENTRY (see longtitudinal plan of care for additional care plan information)  Current Barriers:  [name] Knowledge Deficits related to disease process and Self Health management of Seizures . Chronic Disease Management support and education needs related to History of Seizures, Asthma, Chronic pain   Nurse Case Manager Clinical Goal(s):  [name] Over the next 90 days, the patient will demonstrate ongoing self health care management ability as evidenced by patient will verbalize having no Seizure activity *  CCM RN CM Interventions:  04/07/19 spoke to patient  . Evaluation of current treatment plan related to Seizures  and patient's adherence to plan as established by provider . Reviewed medications with patient and discussed indication, dosage and frequency of his prescribed anti-seizure medication and patient reports understanding and adherence with no noted SE . Determined patient has not experienced any Seizure activity while taking Keppra as prescribed . Discussed plans with patient for ongoing care management follow up and provided patient with direct contact information for care management team . Provided patient  with printed educational materials related to Importance of taking Seizure Medications as Scheduled   Patient Self Care Activities:  . Self administers medications as prescribed . Attends all scheduled provider appointments . Calls pharmacy for medication refills . Calls provider office for new concerns or questions   Initial goal documentation     . "to heal from stomack surgery" (pt-stated)       [place] (see longtitudinal plan of care for additional care plan information)  Current Barriers:  [name] Knowledge Deficits related to pathology results for removal of lymph nodes and ongoing treatment management of Malignant Neoplasm of stomach . Chronic Disease Management support  and education needs related to Malignant Neoplasm of stomach, Asthma, Chronic pain  . Risk for Nutritional Imbalance   Nurse Case Manager Clinical Goal(s):  [name] Over the next 60 days, patient will verbalize understanding of plan for ongoing MD follow up and management of Malignant Neoplasm of stomach . Over the next 90 days, patient will verbalize complete healing from his partial gastrectomy without complications  . Over the next 90 days, patient will demonstrate diet progression as directed by MD w/o complications such as bowel obstruction, dehydration, malnutrition and or extreme weight loss   CCM RN CM Interventions:  04/07/19 spoke to patient  . Assessed for s/s suggestive of depression and or anxiety following recent diagnosis and surgery for Malignant Neoplasm of the stomach . Determined patient is not experiencing depression or anxiety at this time despite his recent change with his health . Evaluation of current treatment plan related to s/p distal Gastrectomy and patient's adherence to plan as established by provider . Determined patient has a left upper abdominal incision that is closed, dry and intact with staples in place, no dressing required . Advised patient to continue to monitor for wound healing by visually inspecting his abdominal wound daily . Discussed importance of notifying his GI surgeon promptly of any symptoms suggestive of infection including, drainage, redness, new or worsening pain at the surgical site, new or worsening swelling at the surgical site and or odor noted to the surgical site . Assessed for knowledge and understanding of post d/c instructions including diet, activity and medications . Reviewed medications with patient and discussed patient is adhering to his medication regimen as directed and has all medications in his home; patient reports getting good effectiveness from his prescribed pain meds and muscle relaxer . Education provided related to when to call  the doctor if he has a change in his condition . Assessed for toleration of soft diet, adequate hydration, nausea, vomiting, diarrhea and or new or worsening abdominal pain, patient denies having any of these symptoms, is tolerating soft diet well   . Discussed plans with patient for ongoing care management follow up and provided patient with direct contact information for care management team . Provided patient with printed educational materials related to Gastrectomy discharge instructions, 6 Tips to be Water Wise . Reviewed scheduled/upcoming provider appointments including: post surgical follow up at [facility] scheduled for 05/08/19 with Dr. Barry Dienes; next AWV with PCP provider Audery Amel, PA scheduled for 04/16/19 @ 2:15 PM   Patient Self Care Activities:  . Self administers medications as prescribed . Attends all scheduled provider appointments . Calls pharmacy for medication refills . Calls provider office for new concerns or questions  Initial goal documentation         Mr. Gruenberg was given information about Chronic Care Management services today including:  1. CCM  service includes personalized support from designated clinical staff supervised by his physician, including individualized plan of care and coordination with other care providers 2. 24/7 contact phone numbers for assistance for urgent and routine care needs. 3. Service will only be billed when office clinical staff spend 20 minutes or more in a month to coordinate care. 4. Only one practitioner may furnish and bill the service in a calendar month. 5. The patient may stop CCM services at any time (effective at the end of the month) by phone call to the office staff. 6. The patient will be responsible for cost sharing (co-pay) of up to 20% of the service fee (after annual deductible is met).  Patient agreed to services and verbal consent obtained.   Plan:   Telephone follow up appointment with care  management team member scheduled for: 05/11/19  Barb Merino, RN, BSN, CCM Care Management Coordinator [place] [facility]  Direct Phone: [phone]

## 2019-04-08 NOTE — Telephone Encounter (Signed)
The patient has been notified of this information and all questions answered. Appt has been made and pt will call if he has any concerns prior to that appt.

## 2019-04-08 NOTE — Telephone Encounter (Signed)
Please call him.  Let him know that his case was discussed at today's multidisciplinary GI cancer conference.  Dr. Barry Dienes was present.  It does not look like he needs any further treatment for the cancer, it was likely completely cured by his surgery.  He needs my first available return office visit to discuss a surveillance regimen and also to see if his H. pylori has been eradicated finally.  Thanks

## 2019-04-08 NOTE — Progress Notes (Signed)
Patient presented at GI Conference, recommendation was follow up with Dr. [name] in 6 months, observation.

## 2019-04-14 ENCOUNTER — Ambulatory Visit: Payer: Medicare HMO

## 2019-04-14 ENCOUNTER — Encounter: Payer: Medicare HMO | Admitting: Internal Medicine

## 2019-04-14 ENCOUNTER — Ambulatory Visit: Payer: Self-pay

## 2019-04-14 DIAGNOSIS — J45909 Unspecified asthma, uncomplicated: Secondary | ICD-10-CM

## 2019-04-14 DIAGNOSIS — G8929 Other chronic pain: Secondary | ICD-10-CM

## 2019-04-14 NOTE — Chronic Care Management (AMB) (Signed)
  Chronic Care Management   Outreach Note  04/14/2019 Name: [name] MRN: [id number] DOB: 03/12/1970  Referred by: Shelby Mattocks, PA-C Reason for referral : Care Coordination   SW placed a successful outbound call to the patient to assist with care coordination needs and completed an SDOH (social determinants of health) screen. Upon confirming patient HIPAA identifiers, SW call experienced an "air drop". SW placed a return call to the patient which was unsuccessful. HIPAA compliant voice message left requesting a return call.  Follow Up Plan: The care management team will reach out to the patient again over the next 7 days.   [name], BSW, CDP Social Worker, Certified Dementia Practitioner [place] [facility] [place] [facility] [phone]

## 2019-04-16 ENCOUNTER — Ambulatory Visit (INDEPENDENT_AMBULATORY_CARE_PROVIDER_SITE_OTHER): Payer: Medicare HMO | Admitting: Internal Medicine

## 2019-04-16 ENCOUNTER — Other Ambulatory Visit: Payer: Self-pay

## 2019-04-16 ENCOUNTER — Encounter: Payer: Self-pay | Admitting: Internal Medicine

## 2019-04-16 ENCOUNTER — Ambulatory Visit (INDEPENDENT_AMBULATORY_CARE_PROVIDER_SITE_OTHER): Payer: Medicare HMO

## 2019-04-16 VITALS — BP 126/84 | HR 85 | Temp 98.2°F | Ht 74.0 in | Wt 156.0 lb

## 2019-04-16 DIAGNOSIS — Z125 Encounter for screening for malignant neoplasm of prostate: Secondary | ICD-10-CM

## 2019-04-16 DIAGNOSIS — Z85028 Personal history of other malignant neoplasm of stomach: Secondary | ICD-10-CM

## 2019-04-16 DIAGNOSIS — Z0000 Encounter for general adult medical examination without abnormal findings: Secondary | ICD-10-CM

## 2019-04-16 DIAGNOSIS — Z1211 Encounter for screening for malignant neoplasm of colon: Secondary | ICD-10-CM | POA: Diagnosis not present

## 2019-04-16 DIAGNOSIS — Z23 Encounter for immunization: Secondary | ICD-10-CM | POA: Diagnosis not present

## 2019-04-16 LAB — POC HEMOCCULT BLD/STL (OFFICE/1-CARD/DIAGNOSTIC)
Fecal Occult Blood, POC: POSITIVE — AB
Fecal Occult Blood, POC: NEGATIVE

## 2019-04-16 MED ORDER — TETANUS-DIPHTHERIA TOXOIDS TD 2-2 LF/0.5ML IM SUSP: 0.5000 mL | Freq: Once | INTRAMUSCULAR | 0 refills | Status: AC

## 2019-04-16 NOTE — Progress Notes (Signed)
This visit occurred during the SARS-CoV-2 public health emergency.  Safety protocols were in place, including screening questions prior to the visit, additional usage of staff PPE, and extensive cleaning of exam room while observing appropriate contact time as indicated for disinfecting solutions.  Subjective:   [name] is a 49 y.o. male who presents for Medicare Annual/Subsequent preventive examination.  Review of Systems:  n/a Cardiac Risk Factors include: male gender;sedentary lifestyle     Objective:    Vitals: BP 126/84   Pulse 85   Temp 98.2 F (36.8 C) (Oral)   Ht 6\' 2"  (1.88 m)   Wt 156 lb (70.8 kg)   BMI 20.03 kg/m   Body mass index is 20.03 kg/m.  Advanced Directives 04/16/2019 03/25/2019 03/19/2019 04/02/2018 09/25/2016 06/23/2016 05/01/2016  Does Patient Have a Medical Advance Directive? No No No No No No No  Would patient like information on creating a medical advance directive? No - Patient declined No - Patient declined Yes (MAU/Ambulatory/Procedural Areas - Information given) No - Patient declined - No - Patient declined -    Tobacco Social History   Tobacco Use  Smoking Status Former Smoker  . Packs/day: 0.00  . Types: Cigars  . Quit date: 03/2019  . Years since quitting: 0.0  Smokeless Tobacco Never Used  Tobacco Comment   smokes a black and mild daily     Counseling given: Not Answered Comment: smokes a black and mild daily   Clinical Intake:  Pre-visit preparation completed: Yes  Pain : No/denies pain     Nutritional Status: BMI of 19-24  Normal Nutritional Risks: None Diabetes: No  How often do you need to have someone help you when you read instructions, pamphlets, or other written materials from your doctor or pharmacy?: 1 - Never What is the last grade level you completed in school?: 12th grade  Interpreter Needed?: No  Information entered by :: NAllen LPN  Past Medical History:  Diagnosis Date  . Abdominal pain    intermittent  . Allergy    SEASONAL  . Anemia   . 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 Asthma   . Cancer [facility])    Gastric Cancer  . GERD (gastroesophageal reflux disease)   . Pancreatitis   . PTSD (post-traumatic stress disorder)    controlled  . PTSD (post-traumatic stress disorder)   . PTSD (post-traumatic stress disorder) 2008  . Rectal polyp 01/09/2006   Hyperplastic  . Seizures ([place])    ADMITS TAKING MEDICATION FOR SEIZURE D/T ANEURYSM ,DO NOT KNOW IF HAD SEIZURE. 11/10/18   Past Surgical History:  Procedure Laterality Date  . arm surgery Left    repair of tendons "work injury cut"-"pinky finger numb"  . BURR HOLE Right 02/28/2015   Procedure: BURR HOLES FOR SUBDURAL HEMATOMA;  Surgeon: Newman Pies, MD;  Location: [place] [facility];  Service: Neurosurgery;  Laterality: Right;  . CEREBRAL ANEURYSM REPAIR    . COLONOSCOPY  2016   colon polyps  . ESOPHAGOGASTRODUODENOSCOPY (EGD) WITH PROPOFOL N/A 01/13/2015   Procedure: ESOPHAGOGASTRODUODENOSCOPY (EGD) WITH PROPOFOL;  Surgeon: [name], MD;  Location: [facility];  Service: Endoscopy;  Laterality: N/A;  . ESOPHAGOGASTRODUODENOSCOPY (EGD) WITH PROPOFOL N/A 03/19/2019   Procedure: ESOPHAGOGASTRODUODENOSCOPY (EGD) WITH PROPOFOL;  Surgeon: [name], MD;  Location: [facility];  Service: Endoscopy;  Laterality: N/A;  . ESOPHAGOGASTRODUODENOSCOPY ENDOSCOPY    . EUS N/A 03/19/2019   Procedure: UPPER ENDOSCOPIC ULTRASOUND (EUS) RADIAL;  Surgeon: [name], MD;  Location: [facility];  Service: Endoscopy;  Laterality: N/A;  . HERNIA REPAIR     Umbilical  . LAPAROSCOPIC GASTRECTOMY  03/25/2019   LAPAROSCOPY DIAGNOSTIC (N/A )  . LAPAROSCOPY N/A 03/25/2019   Procedure: LAPAROSCOPY DIAGNOSTIC;  Surgeon: [name], MD;  Location: [place];  Service: General;  Laterality: N/A;  . LYMPH NODE BIOPSY N/A 03/25/2019   Procedure: Gastric Lymph Node Biopsy;  Surgeon: [name], MD;  Location: [place];  Service: General;  Laterality:  N/A;  . PARTIAL GASTRECTOMY N/A 03/25/2019   Procedure: DISTAL SUBTOTAL GASTRECTOMY;  Surgeon: [name], MD;  Location: [place];  Service: General;  Laterality: N/A;  . SUBMUCOSAL INJECTION  03/19/2019   Procedure: SUBMUCOSAL INJECTION;  Surgeon: [name], MD;  [facility];  Service: Endoscopy;;  . 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 UPPER GASTROINTESTINAL ENDOSCOPY     Family History  Problem Relation Age of Onset  . Stroke Mother   . Hypertension Mother   . Diabetes Father   . Pancreatic cancer Maternal Uncle   . Colon cancer Maternal Uncle   . Cancer Other   . Diabetes Other   . Colon cancer Maternal Grandfather   . Colon cancer Paternal Grandfather   . Pancreatic cancer Cousin   . Esophageal cancer Neg Hx   . Rectal cancer Neg Hx   . Stomach cancer Neg Hx    Social History   Socioeconomic History  . Marital status: Single    Spouse name: Not on file  . Number of children: Not on file  . Years of education: Not on file  . Highest education level: Not on file  Occupational History  . Occupation: disable  Tobacco Use  . Smoking status: Former Smoker    Packs/day: 0.00    Types: Cigars    Quit date: 03/2019    Years since quitting: 0.0  . Smokeless tobacco: Never Used  . Tobacco comment: smokes a black and mild daily  Substance and Sexual Activity  . Alcohol use: No  . Drug use: Yes    Types: Marijuana    Comment: approx [id number]/72021  . Sexual activity: Yes  Other Topics Concern  . 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 Difficulty of Paying Living Expenses: Not hard at all  Food Insecurity: No Food Insecurity  . Worried About Charity fundraiser in the Last Year: Never true  . Ran Out  of Food in the Last Year: Never true  Transportation Needs: No Transportation Needs  . Lack of Transportation (Medical): No  . Lack of Transportation (Non-Medical): No  Physical Activity: Inactive  . Days of Exercise per Week: 0 days  . Minutes of Exercise per Session: 0 min  Stress: No Stress Concern Present  . Feeling of Stress : Not at all  Social Connections:   . Frequency of Communication with Friends and Family: Not on file  . Frequency of Social Gatherings with Friends and Family: Not on file  . Attends Religious Services: Not on file  . Active Member of Clubs or Organizations: Not on file  . Attends Archivist Meetings: Not on file  . Marital Status:  Not on file    Outpatient Encounter Medications as of 04/16/2019  Medication Sig  . bismuth subsalicylate (PEPTO-BISMOL) 262 MG chewable tablet Chew 2 tablets (524 mg total) by mouth 4 (four) times daily.  . [employer] STOMACH RELIEF 262 MG TABS Take 2 tablets by mouth 4 (four) times daily.  . Cyanocobalamin (B-12) 1000 MCG SUBL Place 1,000 mcg under the tongue daily.  [name] dicyclomine (BENTYL) 20 MG tablet Take 1 tablet (20 mg total) by mouth 4 (four) times daily -  before meals and at bedtime. (Patient taking differently: Take 100 mg by mouth 2 (two) times daily. )  . diptheria-tetanus toxoids (DECAVAC) 2-2 LF/0.5ML injection Inject 0.5 mLs into the muscle once for 1 dose.  . docusate sodium (COLACE) 100 MG capsule TAKE 1 CAPSULE BY MOUTH TWICE A DAY (Patient taking differently: Take 100 mg by mouth 2 (two) times daily. )  . fluticasone (FLONASE) 50 MCG/ACT nasal spray Place 1 spray into both nostrils daily as needed for allergies or rhinitis.  . folic acid (FOLVITE) 1 MG tablet TAKE 1 TABLET BY MOUTH EVERY DAY  . levETIRAcetam (KEPPRA) 500 MG tablet Take 1 tablet (500 mg total) by mouth 2 (two) times daily.  [name] loratadine (CLARITIN) 10 MG tablet Take 1 tablet (10 mg total) by mouth daily.  . methocarbamol (ROBAXIN) 500 MG tablet  Take 1 tablet (500 mg total) by mouth every 8 (eight) hours as needed for muscle spasms.  . metoCLOPramide (REGLAN) 10 MG tablet TAKE 1 TO 2 TABLETS BY MOUTH EVERY DAY AT BEDTIME (Patient taking differently: Take 20 mg by mouth at bedtime. )  . Multiple Vitamin (MULTIVITAMIN WITH MINERALS) TABS tablet Take 1 tablet by mouth daily.  . Multiple Vitamin (MULTIVITAMIN WITH MINERALS) TABS tablet Take 1 tablet by mouth daily.  [name] omeprazole (PRILOSEC) 20 MG capsule Take 1 capsule (20 mg total) by mouth 2 (two) times daily before a meal.  . omeprazole (PRILOSEC) 40 MG capsule TAKE 1 CAPSULE BY MOUTH EVERY DAY  . ondansetron (ZOFRAN) 8 MG tablet Take 1 tablet (8 mg total) by mouth every 8 (eight) hours as needed for nausea or vomiting.  [name] oxyCODONE (OXY IR/ROXICODONE) 5 MG immediate release tablet Take 1-2 tablets (5-10 mg total) by mouth every 6 (six) hours as needed.  [name] tetrahydrozoline-zinc (VISINE-AC) 0.05-0.25 % ophthalmic solution Place 2 drops into both eyes 2 (two) times daily as needed (Itching).   . thiamine (VITAMIN B-1) 100 MG tablet TAKE 1 TABLET BY MOUTH EVERY DAY (Patient taking differently: Take 100 mg by mouth daily. )   No facility-administered encounter medications on file as of 04/16/2019.    Activities of Daily Living In your present state of health, do you have any difficulty performing the following activities: 04/16/2019 03/25/2019  Hearing? N -  Vision? N -  Difficulty concentrating or making decisions? N -  Walking or climbing stairs? N -  Dressing or bathing? N -  Doing errands, shopping? N N  Preparing Food and eating ? N -  Using the Toilet? N -  In the past six months, have you accidently leaked urine? N -  Do you have problems with loss of bowel control? N -  Managing your Medications? N -  Managing your Finances? N -  Housekeeping or managing your Housekeeping? N -  Some recent data might be hidden    Patient Care Team: Rodriguez-[place], Sandrea Matte as PCP -  General (Internal Medicine) Rex Kras, [name], RN as Case Manager  Assessment:   This is a routine wellness examination for [place].  Exercise Activities and Dietary recommendations Current Exercise Habits: The patient does not participate in regular exercise at present  Goals    . "I may need help affording my medications in the near future" (pt-stated)     CARE PLAN ENTRY (see longtitudinal plan of care for additional care plan information)  Current Barriers:  [name] Knowledge Deficits related to pharmacy resources available to assist with drug cost . Chronic Disease Management support and education needs related to Malignant Neoplasm of stomach, History of Seizures, Asthma, Chronic pain   Nurse Case Manager Clinical Goal(s):  [name] Over the next 30 days, patient will collaborate with embedded Pharm D to discuss options for financial assistance to help with drug cost and affordability . Over the next 90 days, patient will demonstrate ongoing adherence to prescribed treatment plan for medication management as evidenced by patient will not run out of this medications or miss doses of his medications due to not being able to afford them  CCM RN CM Interventions:  04/07/19 call completed with patient  . Evaluation of current treatment plan related to Medication management including adherence and affordability  and patient's adherence to plan as established by provider. . Reviewed medications with patient and discussed patient may need resources to help with medication cost in the near future; patient is unable to verbalize the names of the medications he may need help with during this call  . Collaborated with embedded Pharm D Lottie Dawson  regarding patient's need for pharmacy resources to help with drug cost . Discussed plans with patient for ongoing care management follow up and provided patient with direct contact information for care management team . Pharmacy referral for assistance with resources  that may help with drug cost due to patient reported expected hardship in the near future  Patient Self Care Activities:  . Self administers medications as prescribed . Attends all scheduled provider appointments . Calls pharmacy for medication refills . Calls provider office for new concerns or questions  Initial goal documentation     . "to have no Seizures" (pt-stated)     CARE PLAN ENTRY (see longtitudinal plan of care for additional care plan information)  Current Barriers:  [name] Knowledge Deficits related to disease process and Self Health management of Seizures . Chronic Disease Management support and education needs related to History of Seizures, Asthma, Chronic pain   Nurse Case Manager Clinical Goal(s):  [name] Over the next 90 days, the patient will demonstrate ongoing self health care management ability as evidenced by patient will verbalize having no Seizure activity *  CCM RN CM Interventions:  04/07/19 spoke to patient  . Evaluation of current treatment plan related to Seizures  and patient's adherence to plan as established by provider . Reviewed medications with patient and discussed indication, dosage and frequency of his prescribed anti-seizure medication and patient reports understanding and adherence with no noted SE . Determined patient has not experienced any Seizure activity while taking Keppra as prescribed . Discussed plans with patient for ongoing care management follow up and provided patient with direct contact information for care management team . Provided patient with printed educational materials related to Importance of taking Seizure Medications as Scheduled   Patient Self Care Activities:  . Self administers medications as prescribed . Attends all scheduled provider appointments . Calls pharmacy for medication refills . Calls provider office for new concerns or questions   Initial goal documentation     . "  to heal from stomack surgery" (pt-stated)      [place] (see longtitudinal plan of care for additional care plan information)  Current Barriers:  [name] Knowledge Deficits related to pathology results for removal of lymph nodes and ongoing treatment management of Malignant Neoplasm of stomach . Chronic Disease Management support and education needs related to Malignant Neoplasm of stomach, Asthma, Chronic Pain  . Risk for Nutritional Imbalance   Nurse Case Manager Clinical Goal(s):  [name] Over the next 60 days, patient will verbalize understanding of plan for ongoing MD follow up and management of Malignant Neoplasm of stomach . Over the next 90 days, patient will verbalize complete healing from his partial gastrectomy without complications  . Over the next 90 days, patient will demonstrate diet progression as directed by MD w/o complications such as bowel obstruction, dehydration, malnutrition and or extreme weight loss   CCM RN CM Interventions:  04/07/19 spoke to patient  . Assessed for s/s suggestive of depression and or anxiety following recent diagnosis and surgery for Malignant Neoplasm of the stomach . Determined patient is not experiencing depression or anxiety at this time despite his recent change with his health . Evaluation of current treatment plan related to s/p distal Gastrectomy and patient's adherence to plan as established by provider . Determined patient has a left upper abdominal incision that is closed, dry and intact with staples in place, no dressing required . Advised patient to continue to monitor for wound healing by visually inspecting his abdominal wound daily . Discussed importance of notifying his GI surgeon promptly of any symptoms suggestive of infection including, drainage, redness, new or worsening pain at the surgical site, new or worsening swelling at the surgical site and or odor noted to the surgical site . Assessed for knowledge and understanding of post d/c instructions including diet, activity and  medications . Reviewed medications with patient and discussed patient is adhering to his medication regimen as directed and has all medications in his home; patient reports getting good effectiveness from his prescribed pain meds and muscle relaxer . Education provided related to when to call the doctor if he has a change in his condition . Assessed for toleration of soft diet, adequate hydration, nausea, vomiting, diarrhea and or new or worsening abdominal pain, patient denies having any of these symptoms, is tolerating soft diet well   . Discussed plans with patient for ongoing care management follow up and provided patient with direct contact information for care management team . Provided patient with printed educational materials related to Gastrectomy discharge instructions, 6 Tips to be Water Wise . Reviewed scheduled/upcoming provider appointments including: post surgical [facility] up at [facility] scheduled for 05/08/19 with Dr. Barry Dienes; next AWV with PCP provider Audery Amel, PA scheduled for 04/16/19 @ 2:15 PM   Patient Self Care Activities:  . Self administers medications as prescribed . Attends all scheduled provider appointments . Calls pharmacy for medication refills . Calls provider office for new concerns or questions  Initial goal documentation     . Patient Stated (pt-stated)     Wants to get up to 300 pounds.    . Patient Stated     04/16/2019, wants to wok out more       Fall Risk Fall Risk  04/16/2019 03/09/2019 07/03/2018 05/01/2018 04/02/2018  Falls in the past year? 0 0 0 0 0  Number falls in past yr: 0 - - 0 -  Injury with Fall? 0 - - 0 -  Risk for fall due to : Medication side effect - - - -  Follow up Falls evaluation completed;Education provided;Falls prevention discussed - - - -   Is the patient's home free of loose throw rugs in walkways, pet beds, electrical cords, etc?   yes      Grab bars in the bathroom? no      Handrails on the stairs?    no      Adequate lighting?   yes  Timed Get Up and Go Performed: n/a  Depression Screen PHQ 2/9 Scores 04/16/2019 04/16/2019 03/09/2019 07/03/2018  PHQ - 2 Score 0 0 0 0  PHQ- 9 Score 0 - - -    Cognitive Function     6CIT Screen 04/16/2019 04/02/2018  What Year? 4 points 0 points  What month? 0 points 0 points  What time? 0 points 0 points  Count back from 20 2 points 0 points  Months in reverse 4 points 4 points  Repeat phrase 0 points 0 points  Total Score 10 4    Immunization History  Administered Date(s) Administered  . Influenza Whole 01/12/2009, 11/18/2009  . Influenza,inj,Quad PF,6+ Mos 03/01/2015, 12/24/2017  . Pneumococcal Polysaccharide-23 03/02/2015    Qualifies for Shingles Vaccine? yes  Screening Tests Health Maintenance  Topic Date Due  . TETANUS/TDAP  08/16/1987  . INFLUENZA VACCINE  11/25/2019 (Originally 09/13/2018)  . COLONOSCOPY  11/23/2021  . HIV Screening  Completed   Cancer Screenings: Lung: Low Dose CT Chest recommended if Age 15-80 years, 30 pack-year currently smoking OR have quit w/in 15years. Patient does not qualify. Colorectal: up to date  Additional Screenings:  Hepatitis C Screening:n/a      Plan:    Patients to work out more.  I have personally reviewed and noted the following in the patient's chart:   . Medical and social history . Use of alcohol, tobacco or illicit drugs  . Current medications and supplements . Functional ability and status . Nutritional status . Physical activity . Advanced directives . List of other physicians . Hospitalizations, surgeries, and ER visits in previous 12 months . Vitals . Screenings to include cognitive, depression, and falls . Referrals and appointments  In addition, I have reviewed and discussed with patient certain preventive protocols, quality metrics, and best practice recommendations. A written personalized care plan for preventive services as well as general preventive health  recommendations were provided to patient.     Kellie Simmering, LPN  [id number]

## 2019-04-16 NOTE — Progress Notes (Signed)
This visit occurred during the SARS-CoV-2 public health emergency.  Safety protocols were in place, including screening questions prior to the visit, additional usage of staff PPE, and extensive cleaning of exam room while observing appropriate contact time as indicated for disinfecting solutions.  Subjective:     Patient ID: [name] , male    DOB: Aug 16, 1970 , 49 y.o.   MRN: [id number]   Chief Complaint  Patient presents with  . Annual Exam    HPI Pt is here for yearly physical. Has been doing much better since he had an endoscopy and GI tumor was removed which had been cancer. States his abdominal pain has resolved.    Past Medical History:  Diagnosis Date  . Abdominal pain    intermittent  . Allergy    SEASONAL  . Anemia   . Aneurysm (HCC)    brain  . Asthma   . Cancer [facility])    Gastric Cancer  . GERD (gastroesophageal reflux disease)   . Pancreatitis   . PTSD (post-traumatic stress disorder)    controlled  . PTSD (post-traumatic stress disorder)   . PTSD (post-traumatic stress disorder) 2008  . Rectal polyp 01/09/2006   Hyperplastic  . Seizures (HCC)    ADMITS TAKING MEDICATION FOR SEIZURE D/T ANEURYSM ,DO NOT KNOW IF HAD SEIZURE. 11/10/18     Family History  Problem Relation Age of Onset  . Stroke Mother   . Hypertension Mother   . Diabetes Father   . Pancreatic cancer Maternal Uncle   . Colon cancer Maternal Uncle   . Cancer Other   . Diabetes Other   . Colon cancer Maternal Grandfather   . Colon cancer Paternal Grandfather   . Pancreatic cancer Cousin   . Esophageal cancer Neg Hx   . Rectal cancer Neg Hx   . Stomach cancer Neg Hx      Current Outpatient Medications:  .  bismuth subsalicylate (PEPTO-BISMOL) 262 MG chewable tablet, Chew 2 tablets (524 mg total) by mouth 4 (four) times daily., Disp: 80 tablet, Rfl: 0 .  [employer] STOMACH RELIEF 262 MG TABS, Take 2 tablets by mouth 4 (four) times daily., Disp: , Rfl:  .  Cyanocobalamin (B-12) 1000 MCG  SUBL, Place 1,000 mcg under the tongue daily., Disp: , Rfl:  .  dicyclomine (BENTYL) 20 MG tablet, Take 1 tablet (20 mg total) by mouth 4 (four) times daily -  before meals and at bedtime. (Patient taking differently: Take 100 mg by mouth 2 (two) times daily. ), Disp: 90 tablet, Rfl: 1 .  docusate sodium (COLACE) 100 MG capsule, TAKE 1 CAPSULE BY MOUTH TWICE A DAY (Patient taking differently: Take 100 mg by mouth 2 (two) times daily. ), Disp: 60 capsule, Rfl: 2 .  fluticasone (FLONASE) 50 MCG/ACT nasal spray, Place 1 spray into both nostrils daily as needed for allergies or rhinitis., Disp: , Rfl:  .  folic acid (FOLVITE) 1 MG tablet, TAKE 1 TABLET BY MOUTH EVERY DAY, Disp: 90 tablet, Rfl: 1 .  levETIRAcetam (KEPPRA) 500 MG tablet, Take 1 tablet (500 mg total) by mouth 2 (two) times daily., Disp: 180 tablet, Rfl: 3 .  loratadine (CLARITIN) 10 MG tablet, Take 1 tablet (10 mg total) by mouth daily., Disp: 30 tablet, Rfl: 5 .  methocarbamol (ROBAXIN) 500 MG tablet, Take 1 tablet (500 mg total) by mouth every 8 (eight) hours as needed for muscle spasms., Disp: 30 tablet, Rfl: 1 .  metoCLOPramide (REGLAN) 10 MG tablet, TAKE 1  TO 2 TABLETS BY MOUTH EVERY DAY AT BEDTIME (Patient taking differently: Take 20 mg by mouth at bedtime. ), Disp: 60 tablet, Rfl: 0 .  Multiple Vitamin (MULTIVITAMIN WITH MINERALS) TABS tablet, Take 1 tablet by mouth daily., Disp: , Rfl:  .  Multiple Vitamin (MULTIVITAMIN WITH MINERALS) TABS tablet, Take 1 tablet by mouth daily., Disp:  , Rfl:  .  omeprazole (PRILOSEC) 20 MG capsule, Take 1 capsule (20 mg total) by mouth 2 (two) times daily before a meal., Disp: 20 capsule, Rfl: 0 .  omeprazole (PRILOSEC) 40 MG capsule, TAKE 1 CAPSULE BY MOUTH EVERY DAY, Disp: 90 capsule, Rfl: 1 .  ondansetron (ZOFRAN) 8 MG tablet, Take 1 tablet (8 mg total) by mouth every 8 (eight) hours as needed for nausea or vomiting., Disp: 30 tablet, Rfl: 2 .  oxyCODONE (OXY IR/ROXICODONE) 5 MG immediate release  tablet, Take 1-2 tablets (5-10 mg total) by mouth every 6 (six) hours as needed., Disp: 30 tablet, Rfl: 0 .  tetrahydrozoline-zinc (VISINE-AC) 0.05-0.25 % ophthalmic solution, Place 2 drops into both eyes 2 (two) times daily as needed (Itching). , Disp: , Rfl:  .  thiamine (VITAMIN B-1) 100 MG tablet, TAKE 1 TABLET BY MOUTH EVERY DAY (Patient taking differently: Take 100 mg by mouth daily. ), Disp: 100 tablet, Rfl: 1 .  diptheria-tetanus toxoids (DECAVAC) 2-2 LF/0.5ML injection, Inject 0.5 mLs into the muscle once for 1 dose., Disp: 0.5 mL, Rfl: 0   Allergies  Allergen Reactions  . Aspirin Anaphylaxis  . Ibuprofen Anaphylaxis  . Hydrocodone Hives and Itching  . Tylenol [Acetaminophen]     Upset stomach     Review of Systems  Has had wt loss due to stomach cancer but his has resolved since the tumor was removed. The rest of his 10 point ROS is neg.  Today's Vitals   04/16/19 1434  BP: 126/84  Pulse: 85  Temp: 98.2 F (36.8 C)  Weight: 156 lb (70.8 kg)  Height: 6\' 2"  (1.88 m)   Body mass index is 20.03 kg/m.   Objective:  Physical Exam  BP 126/84   Pulse 85   Temp 98.2 F (36.8 C)   Ht 6\' 2"  (1.88 m)   Wt 156 lb (70.8 kg)   BMI 20.03 kg/m   General Appearance:    Alert, cooperative, no distress, appears stated age  Head:    Normocephalic, without obvious abnormality, atraumatic  Eyes:    PERRL, conjunctiva/corneas clear, EOM's intact, fundi    benign, both eyes       Ears:    Normal TM's and external ear canals, both ears  Nose:   Nares normal, septum midline, mucosa normal, no drainage   or sinus tenderness  Throat:   Lips, mucosa, and tongue normal; teeth and gums normal  Neck:   Supple, symmetrical, trachea midline, no adenopathy;       thyroid:  No enlargement/tenderness/nodules.  Back:    normal ROM, no curvature noted.   Lungs:     Clear to auscultation bilaterally, respirations unlabored  Chest wall:    No tenderness or deformity  Heart:    Regular rate and  rhythm, S1 and S2 normal, no murmur, rub   or gallop  Abdomen:     Has well healed incision on epigastric region. Soft, non-tender, bowel sounds active all four quadrants,    no masses, no organomegaly  Genitalia:    Normal male without lesion, discharge or tenderness  Rectal:    Normal  tone, normal prostate, no masses or tenderness;   guaiac negative stool  Extremities:   Extremities normal, atraumatic, no cyanosis or edema  Pulses:   2+ and symmetric all extremities  Skin:   Skin color, texture, turgor normal, no rashes or lesions  Lymph nodes:   Cervical, supraclavicular, and axillary nodes normal  Neurologic:   CNII-XII intact. Normal strength, sensation and reflexes      Throughout. Has normal Romberg, tandem gait, finger to nose, tip toe and heel gait.       Assessment And Plan:    1. Routine general medical examination at a health care facility-routin. FU 1 y - POC Hemoccult Bld/Stl (1-Cd Office Dx)- duplicate and results error occurred.  - POC Hemoccult Bld/Stl (1-Cd Office Dx)  2. Personal history of stomach cancer-doing well. - Lipid Profile - TSH  3. Prostate cancer screening- screen. - PSA We will inform him about his results when they are back.  Yuval Nolet RODRIGUEZ-SOUTHWORTH, PA-C    THE PATIENT IS ENCOURAGED TO PRACTICE SOCIAL DISTANCING DUE TO THE COVID-19 PANDEMIC.

## 2019-04-16 NOTE — Patient Instructions (Signed)
[name] , Thank you for taking time to come for your Medicare Wellness Visit. I appreciate your ongoing commitment to your health goals. Please review the following plan we discussed and let me know if I can assist you in the future.   Screening recommendations/referrals: Colonoscopy: 11/2018 Recommended yearly ophthalmology/optometry visit for glaucoma screening and checkup Recommended yearly dental visit for hygiene and checkup  Vaccinations: Influenza vaccine: decline Pneumococcal vaccine: 02/2015 Tdap vaccine: sent to pharmacy Shingles vaccine: discussed    Advanced directives: Advance directive discussed with you today. Even though you declined this today please call our office should you change your mind and we can give you the proper paperwork for you to fill out.   Conditions/risks identified: none  Next appointment: 10/29/2019 at 2:00  Preventive Care 40-64 Years, Male Preventive care refers to lifestyle choices and visits with your health care provider that can promote health and wellness. What does preventive care include?  A yearly physical exam. This is also called an annual well check.  Dental exams once or twice a year.  Routine eye exams. Ask your health care provider how often you should have your eyes checked.  Personal lifestyle choices, including:  Daily care of your teeth and gums.  Regular physical activity.  Eating a healthy diet.  Avoiding tobacco and drug use.  Limiting alcohol use.  Practicing safe sex.  Taking low-dose aspirin every day starting at age 52. What happens during an annual well check? The services and screenings done by your health care provider during your annual well check will depend on your age, overall health, lifestyle risk factors, and family history of disease. Counseling  Your health care provider may ask you questions about your:  Alcohol use.  Tobacco use.  Drug use.  Emotional well-being.  Home and  relationship well-being.  Sexual activity.  Eating habits.  Work and work Statistician. Screening  You may have the following tests or measurements:  Height, weight, and BMI.  Blood pressure.  Lipid and cholesterol levels. These may be checked every 5 years, or more frequently if you are over 65 years old.  Skin check.  Lung cancer screening. You may have this screening every year starting at age 3 if you have a 30-pack-year history of smoking and currently smoke or have quit within the past 15 years.  Fecal occult blood test (FOBT) of the stool. You may have this test every year starting at age 70.  Flexible sigmoidoscopy or colonoscopy. You may have a sigmoidoscopy every 5 years or a colonoscopy every 10 years starting at age 40.  Prostate cancer screening. Recommendations will vary depending on your family history and other risks.  Hepatitis C blood test.  Hepatitis B blood test.  Sexually transmitted disease (STD) testing.  Diabetes screening. This is done by checking your blood sugar (glucose) after you have not eaten for a while (fasting). You may have this done every 1-3 years. Discuss your test results, treatment options, and if necessary, the need for more tests with your health care provider. Vaccines  Your health care provider may recommend certain vaccines, such as:  Influenza vaccine. This is recommended every year.  Tetanus, diphtheria, and acellular pertussis (Tdap, Td) vaccine. You may need a Td booster every 10 years.  Zoster vaccine. You may need this after age 20.  Pneumococcal 13-valent conjugate (PCV13) vaccine. You may need this if you have certain conditions and have not been vaccinated.  Pneumococcal polysaccharide (PPSV23) vaccine. You may need one  or two doses if you smoke cigarettes or if you have certain conditions. Talk to your health care provider about which screenings and vaccines you need and how often you need them. This information is  not intended to replace advice given to you by your health care provider. Make sure you discuss any questions you have with your health care provider. Document Released: 02/25/2015 Document Revised: 10/19/2015 Document Reviewed: 11/30/2014 Elsevier Interactive Patient Education  2017 [place] Prevention in the Home Falls can cause injuries. They can happen to people of all ages. There are many things you can do to make your home safe and to help prevent falls. What can I do on the outside of my home?  Regularly fix the edges of walkways and driveways and fix any cracks.  Remove anything that might make you trip as you walk through a door, such as a raised step or threshold.  Trim any bushes or trees on the path to your home.  Use bright outdoor lighting.  Clear any walking paths of anything that might make someone trip, such as rocks or tools.  Regularly check to see if handrails are loose or broken. Make sure that both sides of any steps have handrails.  Any raised decks and porches should have guardrails on the edges.  Have any leaves, snow, or ice cleared regularly.  Use sand or salt on walking paths during winter.  Clean up any spills in your garage right away. This includes oil or grease spills. What can I do in the bathroom?  Use night lights.  Install grab bars by the toilet and in the tub and shower. Do not use towel bars as grab bars.  Use non-skid mats or decals in the tub or shower.  If you need to sit down in the shower, use a plastic, non-slip stool.  Keep the floor dry. Clean up any water that spills on the floor as soon as it happens.  Remove soap buildup in the tub or shower regularly.  Attach bath mats securely with double-sided non-slip rug tape.  Do not have throw rugs and other things on the floor that can make you trip. What can I do in the bedroom?  Use night lights.  Make sure that you have a light by your bed that is easy to  reach.  Do not use any sheets or blankets that are too big for your bed. They should not hang down onto the floor.  Have a firm chair that has side arms. You can use this for support while you get dressed.  Do not have throw rugs and other things on the floor that can make you trip. What can I do in the kitchen?  Clean up any spills right away.  Avoid walking on wet floors.  Keep items that you use a lot in easy-to-reach places.  If you need to reach something above you, use a strong step stool that has a grab bar.  Keep electrical cords out of the way.  Do not use floor polish or wax that makes floors slippery. If you must use wax, use non-skid floor wax.  Do not have throw rugs and other things on the floor that can make you trip. What can I do with my stairs?  Do not leave any items on the stairs.  Make sure that there are handrails on both sides of the stairs and use them. Fix handrails that are broken or loose. Make sure that handrails  are as long as the stairways.  Check any carpeting to make sure that it is firmly attached to the stairs. Fix any carpet that is loose or worn.  Avoid having throw rugs at the top or bottom of the stairs. If you do have throw rugs, attach them to the floor with carpet tape.  Make sure that you have a light switch at the top of the stairs and the bottom of the stairs. If you do not have them, ask someone to add them for you. What else can I do to help prevent falls?  Wear shoes that:  Do not have high heels.  Have rubber bottoms.  Are comfortable and fit you well.  Are closed at the toe. Do not wear sandals.  If you use a stepladder:  Make sure that it is fully opened. Do not climb a closed stepladder.  Make sure that both sides of the stepladder are locked into place.  Ask someone to hold it for you, if possible.  Clearly mark and make sure that you can see:  Any grab bars or handrails.  First and last steps.  Where the  edge of each step is.  Use tools that help you move around (mobility aids) if they are needed. These include:  Canes.  Walkers.  Scooters.  Crutches.  Turn on the lights when you go into a dark area. Replace any light bulbs as soon as they burn out.  Set up your furniture so you have a clear path. Avoid moving your furniture around.  If any of your floors are uneven, fix them.  If there are any pets around you, be aware of where they are.  Review your medicines with your doctor. Some medicines can make you feel dizzy. This can increase your chance of falling. Ask your doctor what other things that you can do to help prevent falls. This information is not intended to replace advice given to you by your health care provider. Make sure you discuss any questions you have with your health care provider. Document Released: 11/25/2008 Document Revised: 07/07/2015 Document Reviewed: 03/05/2014 Elsevier Interactive Patient Education  2017 [employer].

## 2019-04-17 ENCOUNTER — Telehealth: Payer: Self-pay

## 2019-04-17 ENCOUNTER — Ambulatory Visit: Payer: Self-pay

## 2019-04-17 DIAGNOSIS — G8929 Other chronic pain: Secondary | ICD-10-CM

## 2019-04-17 DIAGNOSIS — J45909 Unspecified asthma, uncomplicated: Secondary | ICD-10-CM

## 2019-04-17 NOTE — Chronic Care Management (AMB) (Signed)
  Chronic Care Management   Outreach Note  04/17/2019 Name: Clent Lafon MRN: [id number] DOB: September 07, 1970  Referred by: Shelby Mattocks, PA-C Reason for referral : Care Coordination   SW placed a second unsuccessful outbound call to the patient to assist with care coordination needs and conduct an SDOH ( social determinants of health) screening. SW left a HIPAA compliant voice message requesting a return call.  Follow Up Plan: The care management team will reach out to the patient again over the next 14 days.   [name], BSW, CDP Social Worker, Certified Dementia Practitioner [place] [facility] [place] [facility] [phone]

## 2019-04-19 DIAGNOSIS — Z0000 Encounter for general adult medical examination without abnormal findings: Secondary | ICD-10-CM | POA: Insufficient documentation

## 2019-04-21 ENCOUNTER — Ambulatory Visit: Payer: Self-pay

## 2019-04-21 ENCOUNTER — Telehealth: Payer: Self-pay

## 2019-04-21 DIAGNOSIS — J45909 Unspecified asthma, uncomplicated: Secondary | ICD-10-CM

## 2019-04-21 NOTE — Chronic Care Management (AMB) (Signed)
  Chronic Care Management   Outreach Note  04/21/2019 Name: [name] MRN: [id number] DOB: 10-19-1970  Referred by: Shelby Mattocks, PA-C Reason for referral : Care Coordination   Third unsuccessful telephone outreach was attempted today to conduct a SDOH (social determinants of health) screen. SW left a HIPAA compliant voice message requesting a return call.  Follow Up Plan: No further SW follow up planned at this time. The patient will remain active with RN Case Manager.  [name], BSW, CDP Social Worker, Certified Dementia Practitioner [place] [facility] [place] [facility] [phone]

## 2019-04-27 ENCOUNTER — Ambulatory Visit: Payer: Medicare HMO | Admitting: Neurology

## 2019-04-28 ENCOUNTER — Other Ambulatory Visit: Payer: Self-pay | Admitting: Internal Medicine

## 2019-04-28 ENCOUNTER — Other Ambulatory Visit: Payer: Self-pay

## 2019-04-28 ENCOUNTER — Other Ambulatory Visit: Payer: Medicare HMO

## 2019-04-28 DIAGNOSIS — Z125 Encounter for screening for malignant neoplasm of prostate: Secondary | ICD-10-CM | POA: Diagnosis not present

## 2019-04-28 DIAGNOSIS — Z0000 Encounter for general adult medical examination without abnormal findings: Secondary | ICD-10-CM | POA: Diagnosis not present

## 2019-04-29 LAB — LIPID PANEL
Chol/HDL Ratio: 2.5 ratio (ref 0.0–5.0)
Cholesterol, Total: 132 mg/dL (ref 100–199)
HDL: 52 mg/dL (ref >39)
LDL Chol Calc (NIH): 70 mg/dL (ref 0–99)
Triglycerides: 39 mg/dL (ref 0–149)
VLDL Cholesterol Cal: 10 mg/dL (ref 5–40)

## 2019-04-29 LAB — PSA: Prostate Specific Ag, Serum: 1 ng/mL (ref 0.0–4.0)

## 2019-04-29 LAB — TSH: TSH: 0.7 u[IU]/mL (ref 0.450–4.500)

## 2019-05-11 ENCOUNTER — Telehealth: Payer: Self-pay

## 2019-05-19 ENCOUNTER — Ambulatory Visit: Payer: Medicare HMO | Admitting: Gastroenterology

## 2019-07-07 ENCOUNTER — Other Ambulatory Visit: Payer: Self-pay

## 2019-07-07 ENCOUNTER — Encounter: Payer: Self-pay | Admitting: Gastroenterology

## 2019-07-07 ENCOUNTER — Ambulatory Visit (INDEPENDENT_AMBULATORY_CARE_PROVIDER_SITE_OTHER): Payer: Medicare HMO | Admitting: Gastroenterology

## 2019-07-07 DIAGNOSIS — Z85028 Personal history of other malignant neoplasm of stomach: Secondary | ICD-10-CM | POA: Diagnosis not present

## 2019-07-07 DIAGNOSIS — R1310 Dysphagia, unspecified: Secondary | ICD-10-CM | POA: Diagnosis not present

## 2019-07-07 NOTE — Patient Instructions (Signed)
If you are age 51 or older, your body mass index should be between 23-30. Your Body mass index is 19.44 kg/m. If this is out of the aforementioned range listed, please consider follow up with your Primary Care Provider.  If you are age 41 or younger, your body mass index should be between 19-25. Your Body mass index is 19.44 kg/m. If this is out of the aformentioned range listed, please consider follow up with your Primary Care Provider.   You have been scheduled for an endoscopy. Please follow written instructions given to you at your visit today. If you use inhalers (even only as needed), please bring them with you on the day of your procedure.  Due to recent changes in healthcare laws, you may see the results of your imaging and laboratory studies on MyChart before your provider has had a chance to review them.  We understand that in some cases there may be results that are confusing or concerning to you. Not all laboratory results come back in the same time frame and the provider may be waiting for multiple results in order to interpret others.  Please give [place] 48 hours in order for your provider to thoroughly review all the results before contacting the office for clarification of your results.   Thank you for entrusting me with your care and choosing [facility].  Dr [name]

## 2019-07-07 NOTE — Addendum Note (Signed)
Addended by: Stevan Born on: 07/07/2019 09:15 AM   Modules accepted: Orders

## 2019-07-07 NOTE — Progress Notes (Signed)
Review of pertinent gastrointestinal problems: 1.  Gastric adenocarcinoma.  EGD December 2016 found gastric ulcer that was H. pylori positive.  He was treated with appropriate antibiotics and repeat EGD 4 months later showed the ulcer completely resolved however H. pylori remain and so he was put on appropriate antibiotics again.  EGD January 2021 epigastric pain showed ulcer greater curvature of stomach, H. pylori positive again and also positive for adenocarcinoma.  Eventual gastric resection Dr. Barry Dienes February 2021 T1aN0 moderately differentiated gastric adenocarcinoma removed.  Clear margins.   HPI: This is a very pleasant 49 year old man whom I last saw at the time of a presurgical endoscopic ultrasound for staging of his gastric cancer.  Eventual gastric resection February 2021.  See the results above.  He has recovered quite well.  He does have some itching at the midline healing incision site.  He also has some mild dysphagia-like symptoms to pills only.  Food goes down fine.  He describes a certain pills as hanging really around the region of his midline incision.  His weight is stable.  He has had no overt GI bleeding.   ROS: complete GI ROS as described in HPI, all other review negative.  Constitutional:  No unintentional weight loss   Past Medical History:  Diagnosis Date  . Abdominal pain    intermittent  . Allergy    SEASONAL  . Anemia   . 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 Asthma   . Cancer [facility])    Gastric Cancer  . GERD (gastroesophageal reflux disease)   . Pancreatitis   . PTSD (post-traumatic stress disorder)    controlled  . PTSD (post-traumatic stress disorder)   . PTSD (post-traumatic stress disorder) 2008  . Rectal polyp 01/09/2006   Hyperplastic  . Seizures ([place])    ADMITS TAKING MEDICATION FOR SEIZURE D/T ANEURYSM ,DO NOT KNOW IF HAD SEIZURE. 11/10/18    Past Surgical History:  Procedure Laterality Date  . arm surgery Left    repair of tendons "work injury  cut"-"pinky finger numb"  . BURR HOLE Right 02/28/2015   Procedure: BURR HOLES FOR SUBDURAL HEMATOMA;  Surgeon: Newman Pies, MD;  Location: [place] [facility];  Service: Neurosurgery;  Laterality: Right;  . CEREBRAL ANEURYSM REPAIR    . COLONOSCOPY  2016   colon polyps  . ESOPHAGOGASTRODUODENOSCOPY (EGD) WITH PROPOFOL N/A 01/13/2015   Procedure: ESOPHAGOGASTRODUODENOSCOPY (EGD) WITH PROPOFOL;  Surgeon: [name], MD;  Location: [facility];  Service: Endoscopy;  Laterality: N/A;  . ESOPHAGOGASTRODUODENOSCOPY (EGD) WITH PROPOFOL N/A 03/19/2019   Procedure: ESOPHAGOGASTRODUODENOSCOPY (EGD) WITH PROPOFOL;  Surgeon: [name], MD;  Location: [facility];  Service: Endoscopy;  Laterality: N/A;  . ESOPHAGOGASTRODUODENOSCOPY ENDOSCOPY    . EUS N/A 03/19/2019   Procedure: UPPER ENDOSCOPIC ULTRASOUND (EUS) RADIAL;  Surgeon: [name], MD;  Location: [facility];  Service: Endoscopy;  Laterality: N/A;  . HERNIA REPAIR     Umbilical  . LAPAROSCOPIC GASTRECTOMY  03/25/2019   LAPAROSCOPY DIAGNOSTIC (N/A )  . LAPAROSCOPY N/A 03/25/2019   Procedure: LAPAROSCOPY DIAGNOSTIC;  Surgeon: [name], MD;  Location: [place];  Service: General;  Laterality: N/A;  . LYMPH NODE BIOPSY N/A 03/25/2019   Procedure: Gastric Lymph Node Biopsy;  Surgeon: [name], MD;  Location: [place];  Service: General;  Laterality: N/A;  . PARTIAL GASTRECTOMY N/A 03/25/2019   Procedure: DISTAL SUBTOTAL GASTRECTOMY;  Surgeon: [name], MD;  Location: [place];  Service: General;  Laterality: N/A;  . SUBMUCOSAL  INJECTION  03/19/2019   Procedure: SUBMUCOSAL INJECTION;  Surgeon: [name], MD;  Location: [facility];  Service: Endoscopy;;  . 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 UPPER GASTROINTESTINAL ENDOSCOPY      Current Outpatient Medications  Medication Sig Dispense Refill  . bismuth subsalicylate (PEPTO-BISMOL) 262 MG chewable tablet Chew 2 tablets (524 mg total) by mouth 4 (four)  times daily. 80 tablet 0  . [employer] STOMACH RELIEF 262 MG TABS Take 2 tablets by mouth 4 (four) times daily.    . Cyanocobalamin (B-12) 1000 MCG SUBL Place 1,000 mcg under the tongue daily.    [name] dicyclomine (BENTYL) 20 MG tablet Take 1 tablet (20 mg total) by mouth 4 (four) times daily -  before meals and at bedtime. (Patient taking differently: Take 100 mg by mouth 2 (two) times daily. ) 90 tablet 1  . docusate sodium (COLACE) 100 MG capsule TAKE 1 CAPSULE BY MOUTH TWICE A DAY (Patient taking differently: Take 100 mg by mouth 2 (two) times daily. ) 60 capsule 2  . fluticasone (FLONASE) 50 MCG/ACT nasal spray Place 1 spray into both nostrils daily as needed for allergies or rhinitis.    . folic acid (FOLVITE) 1 MG tablet TAKE 1 TABLET BY MOUTH EVERY DAY 90 tablet 1  . levETIRAcetam (KEPPRA) 500 MG tablet Take 1 tablet (500 mg total) by mouth 2 (two) times daily. 180 tablet 3  . loratadine (CLARITIN) 10 MG tablet Take 1 tablet (10 mg total) by mouth daily. 30 tablet 5  . methocarbamol (ROBAXIN) 500 MG tablet Take 1 tablet (500 mg total) by mouth every 8 (eight) hours as needed for muscle spasms. 30 tablet 1  . metoCLOPramide (REGLAN) 10 MG tablet TAKE 1 TO 2 TABLETS BY MOUTH EVERY DAY AT BEDTIME (Patient taking differently: Take 20 mg by mouth at bedtime. ) 60 tablet 0  . Multiple Vitamin (MULTIVITAMIN WITH MINERALS) TABS tablet Take 1 tablet by mouth daily.    [name] omeprazole (PRILOSEC) 20 MG capsule Take 1 capsule (20 mg total) by mouth 2 (two) times daily before a meal. 20 capsule 0  . ondansetron (ZOFRAN) 8 MG tablet Take 1 tablet (8 mg total) by mouth every 8 (eight) hours as needed for nausea or vomiting. 30 tablet 2  . oxyCODONE (OXY IR/ROXICODONE) 5 MG immediate release tablet Take 1-2 tablets (5-10 mg total) by mouth every 6 (six) hours as needed. 30 tablet 0  . tetrahydrozoline-zinc (VISINE-AC) 0.05-0.25 % ophthalmic solution Place 2 drops into both eyes 2 (two) times daily as needed (Itching).      . thiamine (VITAMIN B-1) 100 MG tablet TAKE 1 TABLET BY MOUTH EVERY DAY (Patient taking differently: Take 100 mg by mouth daily. ) 100 tablet 1   No current facility-administered medications for this visit.    Allergies as of 07/07/2019 - Review Complete 07/07/2019  Allergen Reaction Noted  . Aspirin Anaphylaxis 01/12/2009  . Ibuprofen Anaphylaxis 01/12/2009  . Hydrocodone Hives and Itching 12/30/2014  . Tylenol [acetaminophen]  04/02/2018    Family History  Problem Relation Age of Onset  . Stroke Mother   . Hypertension Mother   . Diabetes Father   . Pancreatic cancer Maternal Uncle   . Colon cancer Maternal Uncle   . Cancer Other   . Diabetes Other   . Colon cancer Maternal Grandfather   . Colon cancer Paternal Grandfather   . Pancreatic cancer Cousin   . Esophageal cancer Neg Hx   .  Rectal cancer Neg Hx   . Stomach cancer Neg Hx     Social History   Socioeconomic History  . Marital status: Single    Spouse name: Not on file  . Number of children: Not on file  . Years of education: Not on file  . Highest education level: Not on file  Occupational History  . Occupation: disable  Tobacco Use  . Smoking status: Former Smoker    Packs/day: 0.00    Types: Cigars    Quit date: 03/2019    Years since quitting: 0.3  . Smokeless tobacco: Never Used  . Tobacco comment: smokes a black and mild daily  Substance and Sexual Activity  . Alcohol use: No  . Drug use: Yes    Types: Marijuana    Comment: approx 2/72021  . Sexual activity: Yes  Other Topics Concern  . 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 Difficulty of Paying Living Expenses: Not hard at all  Food Insecurity: No Food Insecurity  . Worried About Charity fundraiser  in the Last Year: Never true  . Ran Out of Food in the Last Year: Never true  Transportation Needs: No Transportation Needs  . Lack of Transportation (Medical): No  . Lack of Transportation (Non-Medical): No  Physical Activity: Inactive  . Days of Exercise per Week: 0 days  . Minutes of Exercise per Session: 0 min  Stress: No Stress Concern Present  . Feeling of Stress : Not at all  Social Connections:   . Frequency of Communication with Friends and Family:   . Frequency of Social Gatherings with Friends and Family:   . Attends Religious Services:   . Active Member of Clubs or Organizations:   . Attends Archivist Meetings:   [name] Marital Status:   Intimate Partner Violence:   . Fear of Current or Ex-Partner:   . Emotionally Abused:   [name] Physically Abused:   . Sexually Abused:      Physical Exam: Ht _0  (1.854 m) Comment: height measured without shoes  Wt 147 lb 6 oz (66.8 kg)   BMI 19.44 kg/m  Constitutional: generally well-appearing Psychiatric: alert and oriented x3 Abdomen: soft, nontender, nondistended, no obvious ascites, no peritoneal signs, normal bowel sounds No peripheral edema noted in lower extremities  Assessment and plan: 49 y.o. male with history of gastric cancer, history of recurrent H. pylori infection  I think it is very important that we make sure his H. pylori infection is eradicated.  He has been treated for this 3 times now.  2016, 2017, and then about 3 or 4 months ago.  I am going to reach out to infectious disease to see if there are any special ways that I need to be collecting biopsies of his stomach which I plan to do in about a month with repeat EGD.  Hopefully he will not have any H. pylori at all but if he does I want to be prepared with the appropriate types of collection kit, jars as needed.  Please see the "Patient Instructions" section for addition details about the plan.  Owens Loffler, MD [facility] 07/07/2019,  8:27 AM   Total time on  date of encounter was 30 minutes (this included time spent preparing to see the patient reviewing records; obtaining and/or reviewing separately obtained history; performing a medically appropriate exam and/or evaluation; counseling and educating the patient and family if present; ordering medications, tests or procedures if applicable; and documenting clinical information in the health record).

## 2019-07-14 ENCOUNTER — Encounter: Payer: Medicare HMO | Attending: General Surgery | Admitting: Skilled Nursing Facility1

## 2019-07-14 ENCOUNTER — Encounter: Payer: Self-pay | Admitting: Skilled Nursing Facility1

## 2019-07-14 ENCOUNTER — Other Ambulatory Visit: Payer: Self-pay

## 2019-07-14 DIAGNOSIS — C169 Malignant neoplasm of stomach, unspecified: Secondary | ICD-10-CM | POA: Diagnosis not present

## 2019-07-14 DIAGNOSIS — Z903 Acquired absence of stomach [part of]: Secondary | ICD-10-CM | POA: Diagnosis not present

## 2019-07-14 NOTE — Progress Notes (Addendum)
  Assessment:  Primary concerns today: gastrectomy.   Pt states his usual body weight is 181. Pt states he had a brain aneurysm a few years ago since then has been taking thiamine. Pt states he does take his seizure medicine every day.  Pt states he struggles with nausea and vomiting daily (thinking he is eating too much at once). Pt states he has to wait 15-20 minutes between drinking and eating. Pt states he does experience some cold chills. Pt states he has a bowel movement every other day. Pt states his energy level is really good. Pt states he does not sleep very well at night. Pt states he sleeps about 2 hours consecutively. Pt states he thinks reflux is waking him. Pt states he does smoke marijuana every other day really helping with nausea and appetite. Pt states boost makes him sick.     Body Composition Scale 07/14/2019  Current Body Weight 141.3  Total Body Fat % 7.5  Visceral Fat 1  Fat-Free Mass % 92.5   Total Body Water % 73  Muscle-Mass lbs 27.1  BMI 18  Body Fat Displacement          Torso  lbs 6.5         Left Leg  lbs 1.3         Right Leg  lbs 1.3         Left Arm  lbs .6         Right Arm   lbs .6    MEDICATIONS: pepto, b-12, thiamine, bentyl, colace, folic acid, keppra, reglan, multivitamin-nature made, omeprezole, zofran.    DIETARY INTAKE:  Usual eating pattern includes 3 meals and 1 snacks per day.  Everyday foods include none stated.  Avoided foods include none stated.    24-hr recall:  B ( 8-9 AM): 3-4 eggs, 3 grits, 2 slices toast + butter, 2 slices bacon, 2 sausage  Snk ( AM):  L ( 12-1 PM): Fast food Snk ( PM): D ( PM): larger bowl of cereal (casued vomiting) Snk ( PM):  Beverages: water, coffee, gatorade, body armor, gingerale, lemonade  Usual physical activity: ADL's  Estimated energy needs: 2400 calories 270 g carbohydrates 180 g protein 67g fat  Progress Towards Goal(s):  In progress.   Nutritional Diagnosis:  [place]-1.4 Altered GI  function As related to gastrectomy.  As evidenced by vomiting daily.    Intervention:  Nutrition counseling. Dietitian educated pt on  Goals: -Do not drink anything 15 minutes before eating, nothing while eating, and wait 30 minutes after eating before you drink again  -Aim for eating 5-6 times a day; aim to eat every 3 hours   -Do not eat or drink anything within 2 hours of laying down; sit up in bed to help with pillows stacked behind you  -if you drink coffee let it be decaff  -Avoid Carbonation-Bubbles -Try ginger hot tea; fresh ginger and honey in the kettle -Avoid citrus like orange juice or pineapple juice -Cut your usual portions in half and eat the other half about 3 hours later -Take the bariatric multivitamins and stop the b12, multi, and thiamine   Teaching Method Utilized:  Visual Auditory Hands on   Barriers to learning/adherence to lifestyle change: GI dysfunction   Demonstrated degree of understanding via:  Teach Back   Monitoring/Evaluation:  Dietary intake, exercise, and body weight prn.

## 2019-07-16 ENCOUNTER — Telehealth: Payer: Self-pay

## 2019-07-16 NOTE — Telephone Encounter (Signed)
I called pathology and they are not sure how to answer this question. I am not sure which direction to go to find the answer.  Langley Gauss can you help with this?  I am not sure who to contact.

## 2019-07-16 NOTE — Telephone Encounter (Signed)
-----   Message from [name], MD sent at 07/16/2019  7:11 AM EDT ----- Regarding: RE: No emergency Thanks.  Chong Sicilian, can you reach out to [facility] long pathology and ask them about any specific unique containers or agents which we will need to have available at the time of his late June EGD in the [facility].  I am planning to biopsy his stomach and he has had H. pylori present several times previously, I want to do antibiotic resistance testing on any samples that I take.  Thank you very much ----- Message ----- From: Carlyle Basques, MD Sent: 07/13/2019  12:46 PM EDT To: [name], MD Subject: RE: No emergency                               I think we assume that it is clarithromycin R. I would repeat biopsy to send to lab for resistant testing. Let me see where I can find info to send to. We can see him in our clinic to do alternate regimen ----- Message ----- From: [name], MD Sent: 07/07/2019   8:39 AM EDT To: Carlyle Basques, MD Subject: No emergency                                     Hey, I treated him for H. pylori 2016, it was still in his stomach 3 months later and so I retreated him.  2021 I found gastric ulcer that had intramucosal cancer.  Also still positive for H. Pylori.  I am planning to bring him back for biopsies to check again for H. pylori.  Or is there any special way I should be collecting it if I want to check for resistant type strains?  thanks

## 2019-07-17 NOTE — Telephone Encounter (Signed)
Spoke with [name] at [employer].  She will consult with histology and determine what would be needed and will follow up on this today.

## 2019-07-17 NOTE — Telephone Encounter (Signed)
Received a call from Butch Penny at [facility] lab. She gave the following instructions for Dr. [name] request to perform antibiotic resistance testing on a tissue sample for a patient with recurring H. Pylori.  Butch Penny gave the following instructions for orders and sample collection.  Tissue Biopsy for H. Pylori Culture & Sensitivity Request has to be sent to [facility]  Per Donna/Jeanie at [facility] can order tests and have courier pick up  1. Request H. pylori culture #[id number] 2. Request H. pylori sensitivity as focus lab [phone] (she said to have the focus lab on the order) Specimen needs to be placed in sterile saline container and routed to [facility] [facility].    Note made in the patient Endoscopy appointment that this needs special collection. Routed to Koren Shiver, RN to assist with orders.  Email sent to [facility] also to ask if she needs further assistance to coordinate this.

## 2019-07-17 NOTE — Telephone Encounter (Signed)
Dr [name] I am not sure that this is something that I can order.  The tissue will be collected in the [facility].  We have never entered orders for any pathology and I have no idea where to begin to order this.  Please advise

## 2019-07-17 NOTE — Telephone Encounter (Signed)
Thank you, this is very very helpful.

## 2019-07-20 NOTE — Telephone Encounter (Signed)
Let me gather a little more information from our staff. It was my initial understanding that we only did the collection.  We typically do not place orders.

## 2019-07-20 NOTE — Telephone Encounter (Signed)
Dr Bryan Lemma have you dealt with this issue in the past? I am trying to find out what we need to do to make this happen.. I was told you had a patient in past with the same issue.

## 2019-07-20 NOTE — Telephone Encounter (Signed)
Yes I have (just once) and this is what we did:  - Schedule EGD with biopsies - Contact [facility] ahead of time to ensure that they can be onsite for immediate collection of sample for rapid processing, as the culture needs to be processed in media within 3 hours. This can't just be sent to them, as the bacteria doesn't survive too long in the saline. If positive, this will then be sent reflexively to [facility] for culture sensitivity/susceptibility, all using the below codes  Bottle 1- antrum and body gastric biopsies removed with cold forcep and placed in sterile container with saline only   [facility] Account [id number] [place] code [phone]  [facility] lab for ID and susceptibility/sensitivy with misc. Code 800349   Bottle 2- antrum and body gastric biopsies Sent to [facility] per normal routine with typical [place] staining

## 2019-07-20 NOTE — Telephone Encounter (Signed)
Patty, I am really not sure how this is done either.  Langley Gauss, Can the [facility] nurses order this the day of the procedure or should it be set up beforehand?

## 2019-07-20 NOTE — Telephone Encounter (Signed)
See Dr Vivia Ewing message regarding tissue sample and ordering.  Sheri or [place] who do I send this too? Not sure who orders for procedures.

## 2019-07-20 NOTE — Telephone Encounter (Signed)
Tissue samples are ordered by the office.  This is a path and the order should be placed by the and in the [place] to ensure it returns to the [facility] staff to make sure the results return.

## 2019-07-20 NOTE — Telephone Encounter (Signed)
Yes, the only thing would be coordinating with [facility] ahead of time when you know the date/time of the procedure to ensure they can be on site for an in-person handoff of the sample. Otherwise, everything else is ordered through the [place]. Hope that helps.

## 2019-07-21 ENCOUNTER — Emergency Department (HOSPITAL_COMMUNITY)
Admission: EM | Admit: 2019-07-21 | Discharge: 2019-07-22 | Disposition: A | Discharge status: Home / Self Care | Payer: Medicare HMO | Attending: Emergency Medicine | Admitting: Emergency Medicine

## 2019-07-21 ENCOUNTER — Encounter (HOSPITAL_COMMUNITY): Payer: Self-pay

## 2019-07-21 ENCOUNTER — Other Ambulatory Visit: Payer: Self-pay

## 2019-07-21 DIAGNOSIS — R109 Unspecified abdominal pain: Secondary | ICD-10-CM | POA: Insufficient documentation

## 2019-07-21 DIAGNOSIS — Z5321 Procedure and treatment not carried out due to patient leaving prior to being seen by health care provider: Secondary | ICD-10-CM | POA: Diagnosis not present

## 2019-07-21 LAB — URINALYSIS, ROUTINE W REFLEX MICROSCOPIC
Bacteria, UA: NONE SEEN
Bilirubin Urine: NEGATIVE
Glucose, UA: NEGATIVE mg/dL
Hgb urine dipstick: NEGATIVE
Ketones, ur: NEGATIVE mg/dL
Leukocytes,Ua: NEGATIVE
Nitrite: NEGATIVE
Protein, ur: 30 mg/dL — AB
Specific Gravity, Urine: 1.027 (ref 1.005–1.030)
pH: 5 (ref 5.0–8.0)

## 2019-07-21 LAB — CBC
HCT: 36.7 % — ABNORMAL LOW (ref 39.0–52.0)
Hemoglobin: 12 g/dL — ABNORMAL LOW (ref 13.0–17.0)
MCH: 30.4 pg (ref 26.0–34.0)
MCHC: 32.7 g/dL (ref 30.0–36.0)
MCV: 92.9 fL (ref 80.0–100.0)
Platelets: 177 10*3/uL (ref 150–400)
RBC: 3.95 MIL/uL — ABNORMAL LOW (ref 4.22–5.81)
RDW: 15.1 % (ref 11.5–15.5)
WBC: 4.1 10*3/uL (ref 4.0–10.5)
nRBC: 0 % (ref 0.0–0.2)

## 2019-07-21 NOTE — Telephone Encounter (Signed)
Verified with Randall Hiss, RN this morning that she typed up progress note with codes, as Dr. Bryan Lemma outlined and she placed this request with the patient [facility] sample.  No orders were placed.  Cyril Mourning will outline the procedure for future reference and educate our charge nurse group in the [place].  She will had an outline of the instructions in a charge nurse reference book.  The [facility] staff will contact the provider's GI nurse and have them coordinate the sample pick up.  For this patient, the date and time is already planned.  We will make a note in the patient appt to review this process for sample collection.

## 2019-07-21 NOTE — ED Triage Notes (Signed)
Pt arrives POV for eval of pain under old laceration to stomach. Pt reports that he stuck himself on something sharp in February, was not seen at that time. Pt has tiny circular scar to L side of abd, adjacent to large well healed midline scar. Pt reports that after he stuck himself on the sharp thing he didn't think anything of it, but today, it became tender to touch. Pt denies any pain w/ large incision, any pain in stomach, only c/o pain to overlying skin and this small old scar

## 2019-07-21 NOTE — Telephone Encounter (Signed)
Placed call to [place] # [id number] I spoke with several representatives to try and get the pick up of the sample on the day of the procedure. I was told by 2 different people that the sample does not need to be picked up as STAT that the sample is good for 72 hours.  I requested to be transferred to a supervisor and was told again that the sample is not time sensitive.  I tried to explain in detail the collection information provided by Dr Bryan Lemma.  They took my name and number and was told that someone would check into this and get back to me.

## 2019-07-21 NOTE — Telephone Encounter (Signed)
Received a call from Gordy Clement at [facility] with a message on my voicemail to call back to discuss [phone].  Received voice mail and left a message for a call back on Monday.

## 2019-07-22 ENCOUNTER — Ambulatory Visit: Payer: Self-pay

## 2019-07-22 DIAGNOSIS — G8929 Other chronic pain: Secondary | ICD-10-CM

## 2019-07-22 LAB — COMPREHENSIVE METABOLIC PANEL
ALT: 23 U/L (ref 0–44)
AST: 26 U/L (ref 15–41)
Albumin: 3.5 g/dL (ref 3.5–5.0)
Alkaline Phosphatase: 47 U/L (ref 38–126)
Anion gap: 8 (ref 5–15)
BUN: 10 mg/dL (ref 6–20)
CO2: 24 mmol/L (ref 22–32)
Calcium: 8.7 mg/dL — ABNORMAL LOW (ref 8.9–10.3)
Chloride: 107 mmol/L (ref 98–111)
Creatinine, Ser: 1.11 mg/dL (ref 0.61–1.24)
GFR calc Af Amer: >60 mL/min (ref >60)
GFR calc non Af Amer: >60 mL/min (ref >60)
Glucose, Bld: 130 mg/dL — ABNORMAL HIGH (ref 70–99)
Potassium: 3.6 mmol/L (ref 3.5–5.1)
Sodium: 139 mmol/L (ref 135–145)
Total Bilirubin: 0.7 mg/dL (ref 0.3–1.2)
Total Protein: 6.3 g/dL — ABNORMAL LOW (ref 6.5–8.1)

## 2019-07-22 LAB — LIPASE, BLOOD: Lipase: 25 U/L (ref 11–51)

## 2019-07-22 NOTE — Telephone Encounter (Signed)
Regardless of the sample timing, I believe we would still need to call to arrange a pick up as this is not our usual service.

## 2019-07-22 NOTE — Chronic Care Management (AMB) (Signed)
  Care Management    Consult Note  07/22/2019 Name: [name] MRN: [id number] DOB: Feb 10, 1971  Care management team received notification of patient's recent emergency department visit related to abdominal pain .Based on review of health record, Akiem Jasaun Carn is currently active in the embedded care coordination program.   Review of patient status, including review of consultants reports, relevant laboratory and other test results, and collaboration with appropriate care team members and the patient's provider was performed as part of comprehensive patient evaluation and provision of chronic care management services.    Plan: Collaboration with RN Care Manager to inform of patient recent ED visit. Next planned care management outreach call scheduled for July 7 with RN Care Manager.  [name], BSW, CDP Social Worker, Certified Dementia Practitioner [place] [facility] [place] [facility] [phone]

## 2019-07-22 NOTE — ED Notes (Signed)
Pt stated that he feels better and is going home but will call his PCP to see what they want him to do to further investigate the problem

## 2019-07-26 ENCOUNTER — Emergency Department (HOSPITAL_COMMUNITY): Payer: Medicare HMO

## 2019-07-26 ENCOUNTER — Emergency Department (HOSPITAL_COMMUNITY)
Admission: EM | Admit: 2019-07-26 | Discharge: 2019-07-26 | Disposition: A | Discharge status: Home / Self Care | Payer: Medicare HMO | Attending: Emergency Medicine | Admitting: Emergency Medicine

## 2019-07-26 ENCOUNTER — Encounter (HOSPITAL_COMMUNITY): Payer: Self-pay | Admitting: Emergency Medicine

## 2019-07-26 ENCOUNTER — Other Ambulatory Visit: Payer: Self-pay

## 2019-07-26 DIAGNOSIS — R112 Nausea with vomiting, unspecified: Secondary | ICD-10-CM | POA: Insufficient documentation

## 2019-07-26 DIAGNOSIS — R109 Unspecified abdominal pain: Secondary | ICD-10-CM | POA: Diagnosis not present

## 2019-07-26 DIAGNOSIS — R1084 Generalized abdominal pain: Secondary | ICD-10-CM | POA: Diagnosis not present

## 2019-07-26 DIAGNOSIS — Z79899 Other long term (current) drug therapy: Secondary | ICD-10-CM | POA: Insufficient documentation

## 2019-07-26 DIAGNOSIS — R197 Diarrhea, unspecified: Secondary | ICD-10-CM | POA: Insufficient documentation

## 2019-07-26 DIAGNOSIS — C169 Malignant neoplasm of stomach, unspecified: Secondary | ICD-10-CM | POA: Diagnosis not present

## 2019-07-26 LAB — URINALYSIS, ROUTINE W REFLEX MICROSCOPIC
Bilirubin Urine: NEGATIVE
Glucose, UA: NEGATIVE mg/dL
Hgb urine dipstick: NEGATIVE
Ketones, ur: NEGATIVE mg/dL
Leukocytes,Ua: NEGATIVE
Nitrite: NEGATIVE
Protein, ur: NEGATIVE mg/dL
Specific Gravity, Urine: 1.027 (ref 1.005–1.030)
pH: 5 (ref 5.0–8.0)

## 2019-07-26 LAB — CBC
HCT: 39.1 % (ref 39.0–52.0)
Hemoglobin: 12.4 g/dL — ABNORMAL LOW (ref 13.0–17.0)
MCH: 30 pg (ref 26.0–34.0)
MCHC: 31.7 g/dL (ref 30.0–36.0)
MCV: 94.4 fL (ref 80.0–100.0)
Platelets: 169 10*3/uL (ref 150–400)
RBC: 4.14 MIL/uL — ABNORMAL LOW (ref 4.22–5.81)
RDW: 14.9 % (ref 11.5–15.5)
WBC: 3 10*3/uL — ABNORMAL LOW (ref 4.0–10.5)
nRBC: 0 % (ref 0.0–0.2)

## 2019-07-26 LAB — COMPREHENSIVE METABOLIC PANEL
ALT: 19 U/L (ref 0–44)
AST: 21 U/L (ref 15–41)
Albumin: 3.5 g/dL (ref 3.5–5.0)
Alkaline Phosphatase: 45 U/L (ref 38–126)
Anion gap: 7 (ref 5–15)
BUN: 9 mg/dL (ref 6–20)
CO2: 25 mmol/L (ref 22–32)
Calcium: 9.1 mg/dL (ref 8.9–10.3)
Chloride: 106 mmol/L (ref 98–111)
Creatinine, Ser: 0.96 mg/dL (ref 0.61–1.24)
GFR calc Af Amer: >60 mL/min (ref >60)
GFR calc non Af Amer: >60 mL/min (ref >60)
Glucose, Bld: 82 mg/dL (ref 70–99)
Potassium: 4.3 mmol/L (ref 3.5–5.1)
Sodium: 138 mmol/L (ref 135–145)
Total Bilirubin: 0.7 mg/dL (ref 0.3–1.2)
Total Protein: 6.3 g/dL — ABNORMAL LOW (ref 6.5–8.1)

## 2019-07-26 LAB — LIPASE, BLOOD: Lipase: 22 U/L (ref 11–51)

## 2019-07-26 MED ORDER — MORPHINE SULFATE (PF) 4 MG/ML IV SOLN
4.0000 mg | Freq: Once | INTRAVENOUS | Status: AC
  Administered 2019-07-26: 4 mg via INTRAVENOUS
  Filled 2019-07-26: qty 1

## 2019-07-26 MED ORDER — SODIUM CHLORIDE 0.9% FLUSH
3.0000 mL | Freq: Once | INTRAVENOUS | Status: AC
  Administered 2019-07-26: 3 mL via INTRAVENOUS

## 2019-07-26 MED ORDER — ONDANSETRON 4 MG PO TBDP: 4.0000 mg | ORAL_TABLET | Freq: Three times a day (TID) | ORAL | 0 refills | Status: DC | PRN

## 2019-07-26 MED ORDER — HYDROCODONE-ACETAMINOPHEN 5-325 MG PO TABS: 1.0000 | ORAL_TABLET | ORAL | 0 refills | Status: DC | PRN

## 2019-07-26 MED ORDER — ONDANSETRON HCL 4 MG/2ML IJ SOLN
4.0000 mg | Freq: Once | INTRAMUSCULAR | Status: AC
  Administered 2019-07-26: 4 mg via INTRAVENOUS
  Filled 2019-07-26: qty 2

## 2019-07-26 MED ORDER — SODIUM CHLORIDE 0.9 % IV BOLUS
1000.0000 mL | Freq: Once | INTRAVENOUS | Status: AC
  Administered 2019-07-26: 1000 mL via INTRAVENOUS

## 2019-07-26 NOTE — ED Notes (Signed)
Pt being transported to x-ray

## 2019-07-26 NOTE — ED Provider Notes (Signed)
[facility] Provider Note   CSN: [id number] Arrival date & time: 07/26/19  1129     History Chief Complaint  Patient presents with  . Abdominal Pain    [name] is a 49 y.o. male.  Pt presents to the ED today with abd pain and n/v/d.  He said it's been going on for the past 5 days.  He denies f/c.  He has not been able to eat or drink anything today.  Pt does have a hx of gastric cancer with resection and recurrent h. Pylori.  He is followed by Dr. Rande Brunt of GI.  Pt also has chronic abd pain and pancreatitis.        Past Medical History:  Diagnosis Date  . Abdominal pain    intermittent  . Allergy    SEASONAL  . Anemia   . 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 Asthma   . Gastric cancer ([place])   . GERD (gastroesophageal reflux disease)   . Pancreatitis   . PTSD (post-traumatic stress disorder) 2008  . Rectal polyp 01/09/2006   Hyperplastic  . Seizures ([place])    ADMITS TAKING MEDICATION FOR SEIZURE D/T ANEURYSM ,DO NOT KNOW IF HAD SEIZURE. 11/10/18    Patient Active Problem List   Diagnosis Date Noted  . Personal history of stomach cancer 04/19/2019  . Gastric cancer s/p distal gastrectomy 03/25/2019 03/25/2019  . Personal history of colonic polyps 01/29/2019  . History of pancreatitis 11/26/2017  . Hyponatremia   . Partial seizure ([place]) 03/05/2015  . History of seizures 03/05/2015  . Abdominal pain, chronic, epigastric 07/02/2014  . ABDOMINAL PAIN, GENERALIZED 03/07/2010  . KNEE PAIN, RIGHT 11/18/2009  . HYPERGLYCEMIA 09/08/2009  . ANKLE SPRAIN, LEFT 02/05/2009  . BACK STRAIN, LUMBAR 02/05/2009  . POLYURIA 10/28/2008  . ABDOMINAL PAIN, EPIGASTRIC 10/28/2008  . INSOMNIA, CHRONIC 10/10/2007  . DEPRESSION, CHRONIC 10/10/2007  . Asthma 10/10/2007  . WOUND INFECTION 10/10/2007  . History of alcohol abuse 04/15/2007  . 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 PANCREATITIS, CHRONIC 04/15/2007  . History of amputation of finger, right  04/16/2003    Past Surgical History:  Procedure Laterality Date  . arm surgery Left    repair of tendons "work injury cut"-"pinky finger numb"  . BURR HOLE Right 02/28/2015   Procedure: BURR HOLES FOR SUBDURAL HEMATOMA;  Surgeon: Newman Pies, MD;  Location: [facility];  Service: Neurosurgery;  Laterality: Right;  . CEREBRAL ANEURYSM REPAIR    . COLONOSCOPY  2016   colon polyps  . ESOPHAGOGASTRODUODENOSCOPY (EGD) WITH PROPOFOL N/A 01/13/2015   Procedure: ESOPHAGOGASTRODUODENOSCOPY (EGD) WITH PROPOFOL;  Surgeon: [name], MD;  Location: [facility];  Service: Endoscopy;  Laterality: N/A;  . ESOPHAGOGASTRODUODENOSCOPY (EGD) WITH PROPOFOL N/A 03/19/2019   Procedure: ESOPHAGOGASTRODUODENOSCOPY (EGD) WITH PROPOFOL;  Surgeon: [name], MD;  Location: [facility];  Service: Endoscopy;  Laterality: N/A;  . ESOPHAGOGASTRODUODENOSCOPY ENDOSCOPY    . EUS N/A 03/19/2019   Procedure: UPPER ENDOSCOPIC ULTRASOUND (EUS) RADIAL;  Surgeon: [name], MD;  Location: [facility];  Service: Endoscopy;  Laterality: N/A;  . HERNIA REPAIR     Umbilical  . LAPAROSCOPIC GASTRECTOMY  03/25/2019   LAPAROSCOPY DIAGNOSTIC (N/A )  . LAPAROSCOPY N/A 03/25/2019   Procedure: LAPAROSCOPY DIAGNOSTIC;  Surgeon: [name], MD;  Location: [facility];  Service: General;  Laterality: N/A;  . LYMPH NODE BIOPSY N/A 03/25/2019   Procedure: Gastric Lymph Node Biopsy;  Surgeon: [name], MD;  Location: [facility];  Service: General;  Laterality: N/A;  . PARTIAL GASTRECTOMY N/A 03/25/2019   Procedure: DISTAL SUBTOTAL GASTRECTOMY;  Surgeon: [name], MD;  Location: [facility];  Service: General;  Laterality: N/A;  . SUBMUCOSAL INJECTION  03/19/2019   Procedure: SUBMUCOSAL INJECTION;  Surgeon: [name], MD;  Location: [facility];  Service: Endoscopy;;  . 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 UPPER GASTROINTESTINAL ENDOSCOPY         Family History  Problem Relation Age of Onset  .  Stroke Mother   . Hypertension Mother   . Diabetes Father   . Pancreatic cancer Maternal Uncle   . Colon cancer Maternal Uncle   . Cancer Other   . Diabetes Other   . Colon cancer Maternal Grandfather   . Colon cancer Paternal Grandfather   . Pancreatic cancer Cousin   . Esophageal cancer Neg Hx   . Rectal cancer Neg Hx   . Stomach cancer Neg Hx     Social History   Tobacco Use  . Smoking status: Former Smoker    Packs/day: 0.00    Types: Cigars    Quit date: 03/2019    Years since quitting: 0.3  . Smokeless tobacco: Never Used  . Tobacco comment: smokes a black and mild daily  Vaping Use  . Vaping Use: Never used  Substance Use Topics  . Alcohol use: No  . Drug use: Yes    Types: Marijuana    Comment: approx 2/72021    Home Medications Prior to Admission medications   Medication Sig Start Date End Date Taking? Authorizing Provider  bismuth subsalicylate (PEPTO-BISMOL) 262 MG chewable tablet Chew 2 tablets (524 mg total) by mouth 4 (four) times daily. 03/04/19   [name], MD  [employer] STOMACH RELIEF 262 MG TABS Take 2 tablets by mouth 4 (four) times daily. 03/04/19   [provider]  Cyanocobalamin (B-12) 1000 MCG SUBL Place 1,000 mcg under the tongue daily.    [provider]  dicyclomine (BENTYL) 20 MG tablet Take 1 tablet (20 mg total) by mouth 4 (four) times daily -  before meals and at bedtime. Patient taking differently: Take 100 mg by mouth 2 (two) times daily.  11/26/17   Rodriguez-Southworth, Sunday Spillers, PA-C  docusate sodium (COLACE) 100 MG capsule TAKE 1 CAPSULE BY MOUTH TWICE A DAY Patient taking differently: Take 100 mg by mouth 2 (two) times daily.  01/05/19   Rodriguez-Southworth, Sunday Spillers, PA-C  fluticasone (FLONASE) 50 MCG/ACT nasal spray Place 1 spray into both nostrils daily as needed for allergies or rhinitis.    [provider]  folic acid (FOLVITE) 1 MG tablet TAKE 1 TABLET BY MOUTH EVERY DAY 04/01/19   Rodriguez-Southworth,  Sunday Spillers, PA-C  HYDROcodone-acetaminophen (NORCO/VICODIN) 5-325 MG tablet Take 1 tablet by mouth every 4 (four) hours as needed. 07/26/19   Isla Pence, MD  levETIRAcetam (KEPPRA) 500 MG tablet Take 1 tablet (500 mg total) by mouth 2 (two) times daily. 05/01/18   Cameron Sprang, MD  loratadine (CLARITIN) 10 MG tablet Take 1 tablet (10 mg total) by mouth daily. 01/29/19   Rodriguez-Southworth, Sunday Spillers, PA-C  methocarbamol (ROBAXIN) 500 MG tablet Take 1 tablet (500 mg total) by mouth every 8 (eight) hours as needed for muscle spasms. 03/26/19   [name], MD  metoCLOPramide (REGLAN) 10 MG tablet TAKE 1 TO 2 TABLETS BY MOUTH EVERY DAY AT BEDTIME Patient taking differently: Take 20 mg by mouth at bedtime.  02/16/19   Rodriguez-Southworth, Sunday Spillers, PA-C  Multiple Vitamin (MULTIVITAMIN WITH MINERALS) TABS tablet Take 1 tablet by mouth daily. 03/31/19   Maczis, Barth Kirks, PA-C  omeprazole (PRILOSEC) 20 MG capsule Take 1 capsule (20 mg total) by mouth 2 (two) times daily before a meal. 03/04/19   [name], MD  ondansetron (ZOFRAN ODT) 4 MG disintegrating tablet Take 1 tablet (4 mg total) by mouth every 8 (eight) hours as needed. 07/26/19   Isla Pence, MD  ondansetron (ZOFRAN) 8 MG tablet Take 1 tablet (8 mg total) by mouth every 8 (eight) hours as needed for nausea or vomiting. 03/26/19   [name], MD  oxyCODONE (OXY IR/ROXICODONE) 5 MG immediate release tablet Take 1-2 tablets (5-10 mg total) by mouth every 6 (six) hours as needed. 03/26/19   [name], MD  tetrahydrozoline-zinc (VISINE-AC) 0.05-0.25 % ophthalmic solution Place 2 drops into both eyes 2 (two) times daily as needed (Itching).     [provider]  thiamine (VITAMIN B-1) 100 MG tablet TAKE 1 TABLET BY MOUTH EVERY DAY Patient taking differently: Take 100 mg by mouth daily.  01/05/19   Rodriguez-Southworth, Sunday Spillers, PA-C    Allergies    Aspirin, Ibuprofen, Hydrocodone, and Tylenol [acetaminophen]  Review of Systems     Review of Systems  Gastrointestinal: Positive for abdominal pain, diarrhea, nausea and vomiting.  All other systems reviewed and are negative.   Physical Exam Updated Vital Signs BP 110/76 (BP Location: Right Arm)   Pulse 60   Temp 98 F (36.7 C) (Oral)   Resp 16   SpO2 99%   Physical Exam Vitals and nursing note reviewed.  Constitutional:      Appearance: He is well-developed.  HENT:     Head: Normocephalic and atraumatic.     Mouth/Throat:     Mouth: Mucous membranes are dry.  Eyes:     Extraocular Movements: Extraocular movements intact.     Pupils: Pupils are equal, round, and reactive to light.  Cardiovascular:     Rate and Rhythm: Normal rate and regular rhythm.     Heart sounds: Normal heart sounds.  Pulmonary:     Effort: Pulmonary effort is normal.     Breath sounds: Normal breath sounds.  Abdominal:     General: Abdomen is flat. Bowel sounds are normal.     Palpations: Abdomen is soft.     Tenderness: There is abdominal tenderness in the left upper quadrant and left lower quadrant.  Skin:    General: Skin is warm.     Capillary Refill: Capillary refill takes less than 2 seconds.  Neurological:     General: No focal deficit present.     Mental Status: He is alert and oriented to person, place, and time.  Psychiatric:        Mood and Affect: Mood normal.        Behavior: Behavior normal.     ED Results / Procedures / Treatments   Labs (all labs ordered are listed, but only abnormal results are displayed) Labs Reviewed  COMPREHENSIVE METABOLIC PANEL - Abnormal; Notable for the following components:      Result Value   Total Protein 6.3 (*)    All other components within normal limits  CBC - Abnormal; Notable for the following components:   WBC 3.0 (*)    RBC 4.14 (*)    Hemoglobin 12.4 (*)    All other components within normal limits  LIPASE, BLOOD  URINALYSIS, ROUTINE W REFLEX  MICROSCOPIC    EKG None  Radiology DG Abdomen Acute  W/Chest  Result Date: 07/26/2019 CLINICAL DATA:  Diffuse abdominal pain EXAM: DG ABDOMEN ACUTE W/ 1V CHEST COMPARISON:  03/06/2019 CT FINDINGS: Cardiac shadows within normal limits. Lungs are hyperinflated bilaterally. No focal infiltrate or sizable effusion is seen. Bilateral nipple shadows are again noted and stable. Scattered large and small bowel gas is noted. No abnormal mass or abnormal calcifications are seen. No free air is noted. No acute bony abnormality is seen. IMPRESSION: No acute abnormality noted. Changes consistent with COPD. Electronically Signed   By: Inez Catalina M.D.   On: 07/26/2019 15:02    Procedures Procedures (including critical care time)  Medications Ordered in ED Medications  sodium chloride flush (NS) 0.9 % injection 3 mL (3 mLs Intravenous Given 07/26/19 1327)  sodium chloride 0.9 % bolus 1,000 mL (1,000 mLs Intravenous New Bag/Given 07/26/19 1326)  ondansetron (ZOFRAN) injection 4 mg (4 mg Intravenous Given 07/26/19 1327)  morphine 4 MG/ML injection 4 mg (4 mg Intravenous Given 07/26/19 1326)    ED Course  I have reviewed the triage vital signs and the nursing notes.  Pertinent labs & imaging results that were available during my care of the patient were reviewed by me and considered in my medical decision making (see chart for details).    MDM Rules/Calculators/A&P                          Labs are WNL.  Pt said he's had vomiting for 5 days, but he has no ketones. Pt is feeling better.  Pt is stable for d/c.  F/u with Dr. [name].  Final Clinical Impression(s) / ED Diagnoses Final diagnoses:  Generalized abdominal pain  Non-intractable vomiting with nausea, unspecified vomiting type    Rx / DC Orders ED Discharge Orders         Ordered    ondansetron (ZOFRAN ODT) 4 MG disintegrating tablet  Every 8 hours PRN     Discontinue  Reprint     07/26/19 1507    HYDROcodone-acetaminophen (NORCO/VICODIN) 5-325 MG tablet  Every 4 hours PRN     Discontinue   Reprint     07/26/19 1507           Isla Pence, MD 07/26/19 [phone]

## 2019-07-26 NOTE — ED Triage Notes (Signed)
C/o L upper abd pain, nausea, vomiting, and diarrhea x 5 days.

## 2019-07-27 NOTE — Telephone Encounter (Signed)
Message left for Gordy Clement to return call-he is on vacation this week.

## 2019-07-29 ENCOUNTER — Encounter: Payer: Self-pay | Admitting: Gastroenterology

## 2019-08-03 ENCOUNTER — Ambulatory Visit (INDEPENDENT_AMBULATORY_CARE_PROVIDER_SITE_OTHER): Payer: Medicare HMO | Admitting: Neurology

## 2019-08-03 ENCOUNTER — Encounter: Payer: Self-pay | Admitting: Neurology

## 2019-08-03 ENCOUNTER — Other Ambulatory Visit: Payer: Self-pay

## 2019-08-03 VITALS — BP 96/68 | HR 81 | Ht 74.0 in | Wt 145.4 lb

## 2019-08-03 DIAGNOSIS — Z8679 Personal history of other diseases of the circulatory system: Secondary | ICD-10-CM | POA: Diagnosis not present

## 2019-08-03 DIAGNOSIS — G40209 Localization-related (focal) (partial) symptomatic epilepsy and epileptic syndromes with complex partial seizures, not intractable, without status epilepticus: Secondary | ICD-10-CM | POA: Diagnosis not present

## 2019-08-03 MED ORDER — LEVETIRACETAM 500 MG PO TABS: 500.0000 mg | ORAL_TABLET | Freq: Two times a day (BID) | ORAL | 3 refills | Status: DC

## 2019-08-03 NOTE — Telephone Encounter (Signed)
I spoke with [name] at [facility] and he took all the information for the pt and date/time of the procedure.  He states he will work on getting someone here for the sample.  He will call back to confirm tomorrow.

## 2019-08-03 NOTE — Progress Notes (Signed)
NEUROLOGY FOLLOW UP OFFICE NOTE  [name] [id number] March 29, 1970  HISTORY OF PRESENT ILLNESS: I had the pleasure of seeing [name] in follow-up in the neurology clinic on 08/03/2019.  The patient was last seen over a year ago for well-controlled seizures. He denies any seizures since 2017 on Levetiracetam 500mg  BID. He denies any staring/unresponsive episodes, gaps in time, olfactory/gustatory hallucinations, focal numbness/tingling/weakness, myoclonic jerk. No headaches, vision changes, no falls. He has occasional dizziness. It takes a while to go to sleep. Mood is "okay, I guess." Since his last visit, he was diagnosed with gastric adenocarcinoma and underwent gastric resection in 03/2019, still recovering from this. Appetite okay.   History on Initial Assessment 05/01/2018: This is a pleasant 49 year old right-handed man with a history of pancreatitis, subdural hematoma with subsequent seizure, presenting to establish care for seizures. In January 2017, he was found in a car with altered mental status, per notes people at the gas station close by had seen him in the car 24 hours prior. He was incontinent of urine and staring at staff in the ER. He was found to have a right subdural hematoma with subfalcine herniation and left ventricular entrapment. He had burr holes to evacuate the hematoma with improvement in mental status. Family brought him back to the ER 3 days after hospital discharge reporting trance-like states, zoning out and staring straight ahead. He was witnessed to have a similar episode in the ER. Repeat head CT showed a persistent small amount of subdural fluid, right to left shift unchanged from prior scan. He was discharged home on Keppra 500mg  BID and denies any further seizures since then. He lives alone and denies being told of any staring/unresponsive episodes, he denies any gaps in time, olfactory/gustatory hallucinations, focal numbness/tingling/weakness, myoclonic  jerks. He denies any headaches. He recalls having bad headaches 2 days prior to his subdural, he denies any head injuries. He denies any diplopia, dysarthria/dysphagia, neck pain, bladder dysfunction. He has pancreatitis and wakes up every morning with nausea and vomiting. He feels better after he vomits but feels dizzy for a minute. He has had back pain even prior to his subdural, he takes Percocet for his stomach and back. He has constipation.   Epilepsy Risk Factors:  History of right subdural hematoma s/p burr hole. Otherwise he had a normal birth and early development.  There is no history of febrile convulsions, CNS infections such as meningitis/encephalitis, or family history of seizures.  I personally reviewed his most recent head CT done 09/2016 which did not show any acute changes, no further evidence of subdural fluid.   PAST MEDICAL HISTORY: Past Medical History:  Diagnosis Date  . Abdominal pain    intermittent  . Allergy    SEASONAL  . Anemia   . 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 Asthma   . Gastric cancer ([place])   . GERD (gastroesophageal reflux disease)   . Pancreatitis   . PTSD (post-traumatic stress disorder) 2008  . Rectal polyp 01/09/2006   Hyperplastic  . Seizures ([place])    ADMITS TAKING MEDICATION FOR SEIZURE D/T ANEURYSM ,DO NOT KNOW IF HAD SEIZURE. 11/10/18    MEDICATIONS: Current Outpatient Medications on File Prior to Visit  Medication Sig Dispense Refill  . bismuth subsalicylate (PEPTO-BISMOL) 262 MG chewable tablet Chew 2 tablets (524 mg total) by mouth 4 (four) times daily. 80 tablet 0  . [facility] STOMACH RELIEF 262 MG TABS Take 2 tablets by mouth 4 (four) times  daily.    . Cyanocobalamin (B-12) 1000 MCG SUBL Place 1,000 mcg under the tongue daily.    [name] dicyclomine (BENTYL) 20 MG tablet Take 1 tablet (20 mg total) by mouth 4 (four) times daily -  before meals and at bedtime. (Patient taking differently: Take 100 mg by mouth 2 (two) times daily. ) 90 tablet 1  . docusate  sodium (COLACE) 100 MG capsule TAKE 1 CAPSULE BY MOUTH TWICE A DAY (Patient taking differently: Take 100 mg by mouth 2 (two) times daily. ) 60 capsule 2  . fluticasone (FLONASE) 50 MCG/ACT nasal spray Place 1 spray into both nostrils daily as needed for allergies or rhinitis.    . folic acid (FOLVITE) 1 MG tablet TAKE 1 TABLET BY MOUTH EVERY DAY 90 tablet 1  . HYDROcodone-acetaminophen (NORCO/VICODIN) 5-325 MG tablet Take 1 tablet by mouth every 4 (four) hours as needed. 10 tablet 0  . levETIRAcetam (KEPPRA) 500 MG tablet Take 1 tablet (500 mg total) by mouth 2 (two) times daily. 180 tablet 3  . loratadine (CLARITIN) 10 MG tablet Take 1 tablet (10 mg total) by mouth daily. 30 tablet 5  . methocarbamol (ROBAXIN) 500 MG tablet Take 1 tablet (500 mg total) by mouth every 8 (eight) hours as needed for muscle spasms. 30 tablet 1  . metoCLOPramide (REGLAN) 10 MG tablet TAKE 1 TO 2 TABLETS BY MOUTH EVERY DAY AT BEDTIME (Patient taking differently: Take 20 mg by mouth at bedtime. ) 60 tablet 0  . Multiple Vitamin (MULTIVITAMIN WITH MINERALS) TABS tablet Take 1 tablet by mouth daily.    [name] omeprazole (PRILOSEC) 20 MG capsule Take 1 capsule (20 mg total) by mouth 2 (two) times daily before a meal. 20 capsule 0  . ondansetron (ZOFRAN ODT) 4 MG disintegrating tablet Take 1 tablet (4 mg total) by mouth every 8 (eight) hours as needed. 10 tablet 0  . ondansetron (ZOFRAN) 8 MG tablet Take 1 tablet (8 mg total) by mouth every 8 (eight) hours as needed for nausea or vomiting. 30 tablet 2  . oxyCODONE (OXY IR/ROXICODONE) 5 MG immediate release tablet Take 1-2 tablets (5-10 mg total) by mouth every 6 (six) hours as needed. 30 tablet 0  . tetrahydrozoline-zinc (VISINE-AC) 0.05-0.25 % ophthalmic solution Place 2 drops into both eyes 2 (two) times daily as needed (Itching).     . thiamine (VITAMIN B-1) 100 MG tablet TAKE 1 TABLET BY MOUTH EVERY DAY (Patient taking differently: Take 100 mg by mouth daily. ) 100 tablet 1    No current facility-administered medications on file prior to visit.    ALLERGIES: Allergies  Allergen Reactions  . Aspirin Anaphylaxis  . Ibuprofen Anaphylaxis  . Hydrocodone Hives and Itching  . Tylenol [Acetaminophen]     Upset stomach    FAMILY HISTORY: Family History  Problem Relation Age of Onset  . Stroke Mother   . Hypertension Mother   . Diabetes Father   . Pancreatic cancer Maternal Uncle   . Colon cancer Maternal Uncle   . Cancer Other   . Diabetes Other   . Colon cancer Maternal Grandfather   . Colon cancer Paternal Grandfather   . Pancreatic cancer Cousin   . Esophageal cancer Neg Hx   . Rectal cancer Neg Hx   . Stomach cancer Neg Hx     SOCIAL HISTORY: Social History   Socioeconomic History  . Marital status: Single    Spouse name: Not on file  . Number of children: Not on file  .  Years of education: Not on file  . Highest education level: Not on file  Occupational History  . Occupation: disable  Tobacco Use  . Smoking status: Current Some Day Smoker    Packs/day: 0.00    Types: Cigars    Last attempt to quit: 03/2019    Years since quitting: 0.3  . Smokeless tobacco: Never Used  . Tobacco comment: smokes a black and mild daily  Vaping Use  . Vaping Use: Never used  Substance and Sexual Activity  . Alcohol use: No  . Drug use: Yes    Types: Marijuana    Comment: approx 2/72021  . Sexual activity: Yes  Other Topics Concern  . 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 Difficulty of Paying Living Expenses: Not hard at all  Food Insecurity: No Food Insecurity  . Worried About Charity fundraiser in the Last Year: Never true  . Ran Out of Food in the Last Year: Never true  Transportation Needs: No  Transportation Needs  . Lack of Transportation (Medical): No  . Lack of Transportation (Non-Medical): No  Physical Activity: Inactive  . Days of Exercise per Week: 0 days  . Minutes of Exercise per Session: 0 min  Stress: No Stress Concern Present  . Feeling of Stress : Not at all  Social Connections:   . Frequency of Communication with Friends and Family:   . Frequency of Social Gatherings with Friends and Family:   . Attends Religious Services:   . Active Member of Clubs or Organizations:   . Attends Archivist Meetings:   [name] Marital Status:   Intimate Partner Violence:   . Fear of Current or Ex-Partner:   . Emotionally Abused:   [name] Physically Abused:   . Sexually Abused:    PHYSICAL EXAM: Vitals:   08/03/19 1418  BP: 96/68  Pulse: 81  SpO2: 99%   General: No acute distress Head:  Normocephalic/atraumatic Skin/Extremities: No rash, no edema Neurological Exam: alert and oriented to person, place, and time. No aphasia or dysarthria. Fund of knowledge is appropriate.  Recent and remote memory are intact.  Attention and concentration are normal. Cranial nerves: Pupils equal, round, reactive to light. Extraocular movements intact with no nystagmus. Visual fields full. No facial asymmetry. Motor: Bulk and tone normal, muscle strength 5/5 throughout with no pronator drift. Finger to nose testing intact.  Gait narrow-based and steady, able to tandem walk adequately.  Romberg negative.   IMPRESSION: This is a 49 yo RH man with a history of pancreatitis, subdural hematoma s/p evacuation in 2017 who had focal seizures with impaired awareness a few days after hospital discharge in 2017. He continues to deny any seizures or seizure-like symptoms since 2017, no side effects on Levetiracetam 500mg  BID. We had previously discussed potentially weaning off medication since he has been seizure-free for 4 years, would need repeat brain imaging and EEG, as well as driving restrictions once  medication weaned off. He opted to continue medication, refills sent. He is aware of [place] driving laws to stop driving after a seizure until 6 months seizure-free. Follow-up in 1 year, he knows to call for any changes.    Thank you for  allowing me to participate in his care.  Please do not hesitate to call for any questions or concerns.   Ellouise Newer, M.D.   CC: Shelby Mattocks, PA-C

## 2019-08-03 NOTE — Patient Instructions (Signed)
Good to see you! Continue Keppra 500mg  twice a day. Follow-up in 1 year, call for any changes.   Seizure Precautions: 1. If medication has been prescribed for you to prevent seizures, take it exactly as directed.  Do not stop taking the medicine without talking to your doctor first, even if you have not had a seizure in a long time.   2. Avoid activities in which a seizure would cause danger to yourself or to others.  Don't operate dangerous machinery, swim alone, or climb in high or dangerous places, such as on ladders, roofs, or girders.  Do not drive unless your doctor says you may.  3. If you have any warning that you may have a seizure, lay down in a safe place where you can't hurt yourself.    4.  No driving for 6 months from last seizure, as per [facility].   Please refer to the following link on the [place] website for more information: [email or web address]   6.  Maintain good sleep hygiene.  6.  Contact your doctor if you have any problems that may be related to the medicine you are taking.  7.  Call 911 and bring the patient back to the ED if:        A.  The seizure lasts longer than 5 minutes.       B.  The patient doesn't awaken shortly after the seizure  C.  The patient has new problems such as difficulty seeing, speaking or moving  D.  The patient was injured during the seizure  E.  The patient has a temperature over 102 F (39C)  F.  The patient vomited and now is having trouble breathing

## 2019-08-03 NOTE — Telephone Encounter (Signed)
Left message for Elta Guadeloupe to return call to discuss pick up of special tissue sample.

## 2019-08-07 ENCOUNTER — Other Ambulatory Visit: Payer: Self-pay | Admitting: Gastroenterology

## 2019-08-07 ENCOUNTER — Other Ambulatory Visit: Payer: Self-pay

## 2019-08-07 ENCOUNTER — Ambulatory Visit (AMBULATORY_SURGERY_CENTER): Payer: Medicare HMO | Admitting: Gastroenterology

## 2019-08-07 ENCOUNTER — Encounter: Payer: Self-pay | Admitting: Gastroenterology

## 2019-08-07 VITALS — BP 134/76 | HR 55 | Temp 97.1°F | Resp 7 | Ht 73.0 in | Wt 147.0 lb

## 2019-08-07 DIAGNOSIS — K2970 Gastritis, unspecified, without bleeding: Secondary | ICD-10-CM | POA: Diagnosis not present

## 2019-08-07 DIAGNOSIS — Z85028 Personal history of other malignant neoplasm of stomach: Secondary | ICD-10-CM | POA: Diagnosis not present

## 2019-08-07 DIAGNOSIS — R109 Unspecified abdominal pain: Secondary | ICD-10-CM | POA: Diagnosis not present

## 2019-08-07 DIAGNOSIS — K2950 Unspecified chronic gastritis without bleeding: Secondary | ICD-10-CM | POA: Diagnosis not present

## 2019-08-07 MED ORDER — SODIUM CHLORIDE 0.9 % IV SOLN: 500.0000 mL | Freq: Once | INTRAVENOUS | Status: DC

## 2019-08-07 NOTE — Progress Notes (Signed)
[place] Last seizure- 2 years ago per pt  06-15-19- 2nd dose of Covid vaccine per pt  Called to room to assist during endoscopic procedure.  Patient ID and intended procedure confirmed with present staff. Received instructions for my participation in the procedure from the performing physician.

## 2019-08-07 NOTE — Patient Instructions (Signed)
The biopsies taken today have been sent for pathology.  The results can take 1-3 weeks to receive.      You may resume your previous diet and medication schedule.  Thank you for allowing us to care for you today!!!   YOU HAD AN ENDOSCOPIC PROCEDURE TODAY AT THE [facility]:   Refer to the procedure report that was given to you for any specific questions about what was found during the examination.  If the procedure report does not answer your questions, please call your gastroenterologist to clarify.  If you requested that your care partner not be given the details of your procedure findings, then the procedure report has been included in a sealed envelope for you to review at your convenience later.  YOU SHOULD EXPECT: Some feelings of bloating in the abdomen. Passage of more gas than usual.  Walking can help get rid of the air that was put into your GI tract during the procedure and reduce the bloating.   Please Note:  You might notice some irritation and congestion in your nose or some drainage.  This is from the oxygen used during your procedure.  There is no need for concern and it should clear up in a day or so.  SYMPTOMS TO REPORT IMMEDIATELY:   Following upper endoscopy (EGD)  Vomiting of blood or coffee ground material  New chest pain or pain under the shoulder blades  Painful or persistently difficult swallowing  New shortness of breath  Fever of 100F or higher  Black, tarry-looking stools  For urgent or emergent issues, a gastroenterologist can be reached at any hour by calling [phone]. Do not use MyChart messaging for urgent concerns.    DIET:  We do recommend a small meal at first, but then you may proceed to your regular diet.  Drink plenty of fluids but you should avoid alcoholic beverages for 24 hours.  ACTIVITY:  You should plan to take it easy for the rest of today and you should NOT DRIVE or use heavy machinery until tomorrow (because of the  sedation medicines used during the test).    FOLLOW UP: Our staff will call the number listed on your records 48-72 hours following your procedure to check on you and address any questions or concerns that you may have regarding the information given to you following your procedure. If we do not reach you, we will leave a message.  We will attempt to reach you two times.  During this call, we will ask if you have developed any symptoms of COVID 19. If you develop any symptoms (ie: fever, flu-like symptoms, shortness of breath, cough etc.) before then, please call [phone].  If you test positive for Covid 19 in the 2 weeks post procedure, please call and report this information to us.    If any biopsies were taken you will be contacted by phone or by letter within the next 1-3 weeks.  Please call us at [phone] if you have not heard about the biopsies in 3 weeks.    SIGNATURES/CONFIDENTIALITY: You and/or your care partner have signed paperwork which will be entered into your electronic medical record.  These signatures attest to the fact that that the information above on your After Visit Summary has been reviewed and is understood.  Full responsibility of the confidentiality of this discharge information lies with you and/or your care-partner. 

## 2019-08-07 NOTE — Progress Notes (Signed)
A/ox3, pleased with MAC, report to RN 

## 2019-08-07 NOTE — Op Note (Signed)
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                            Barry Dienes, previous recurrent H. pylori Medicines:                Monitored Anesthesia Care Procedure:                Pre-Anesthesia Assessment:                           - Prior to the procedure, a History and Physical                            was performed, and patient medications and                            allergies were reviewed. The patient's tolerance of                            previous anesthesia was also reviewed. The risks                            and benefits of the procedure and the sedation                            options and risks were discussed with the patient.                            All questions were answered, and informed consent                            was obtained. Prior Anticoagulants: The patient has                            taken no previous anticoagulant or antiplatelet                            agents. ASA Grade Assessment: II - A patient with                            mild systemic disease. After reviewing the risks                            and benefits, the patient was deemed in                            satisfactory condition to undergo the procedure.  After obtaining informed consent, the endoscope was                            passed under direct vision. Throughout the                            procedure, the patient's blood pressure, pulse, and                            oxygen saturations were monitored continuously. The                            Endoscope was introduced through the mouth, and                            advanced to the afferent  and efferent jejunal                            loops. The upper GI endoscopy was accomplished                            without difficulty. The patient tolerated the                            procedure well. Scope In: Scope Out: Findings:                 Typical anatomy follow distal gastric resection,                            Bilroth II type anastomosis. The mucosa at the                            anastomosis was slightly edematous but not                            neoplastic appearing.                           Normal afferent and efferent limbs.                           Multiple biopsies taken from gastric remnant, sent                            for H. pylori culture as well as routine histology.                           The exam was otherwise without abnormality. Complications:            No immediate complications. Estimated blood loss:                            None. Estimated Blood Loss:     Estimated blood loss: none. Impression:               -  Typical anatomy follow distal gastric resection,                            Bilroth II type anastomosis. The mucosa at the                            anastomosis was slightly edematous but not                            neoplastic appearing.                           - Normal afferent and efferent limbs.                           - Multiple biopsies taken from gastric remnant,                            sent for H. pylori culture as well as routine                            histology.                           - The examination was otherwise normal. Recommendation:           - Patient has a contact number available for                            emergencies. The signs and symptoms of potential                            delayed complications were discussed with the                            patient. Return to normal activities tomorrow.                            Written discharge instructions were provided to the                             patient.                           - Resume previous diet.                           - Continue present medications.                           - Await pathology results. [name], MD 08/07/2019 10:38:52 AM This report has been signed electronically.

## 2019-08-07 NOTE — Progress Notes (Signed)
1035- biopsy samples given to [facility] from [employer] is aware that there will be only 1 bottle in the pathoogy bag for GPA, but it will be labeled as bottle 2

## 2019-08-07 NOTE — Progress Notes (Signed)
Bottle 1- antrum and body gastric biopsies removed with cold forcep  Pathology sen to [facility] per DO  [place] Account [id number] Requisition code [phone]  H Pylori culture  Specimen sent in sterile saline  [facility] for ID and susceptibility/sensitivity with misc code [phone]  Bottle 2-antrum and body gastric bx R/O HP- sent to [facility] Pathology per normal routine   [facility] sample immediately given to courier  Copy of this note sent with pathology to [place].

## 2019-08-10 LAB — SPECIMEN STATUS REPORT

## 2019-08-11 ENCOUNTER — Telehealth: Payer: Self-pay

## 2019-08-11 ENCOUNTER — Ambulatory Visit: Payer: Medicare HMO | Admitting: Skilled Nursing Facility1

## 2019-08-11 NOTE — Telephone Encounter (Signed)
  Follow up Call-  Call back number 08/07/2019 03/02/2019 11/24/2018  Post procedure Call Back phone  # [phone]  Permission to leave phone message Yes Yes Yes  Some recent data might be hidden     Patient questions:  Do you have a fever, pain , or abdominal swelling? No. Pain Score  0 *  Have you tolerated food without any problems? Yes.    Have you been able to return to your normal activities? Yes.    Do you have any questions about your discharge instructions: Diet   No. Medications  No. Follow up visit  No.  Do you have questions or concerns about your Care? No.  Actions: * If pain score is 4 or above: No action needed, pain <4.  1. Have you developed a fever since your procedure? no  2.   Have you had an respiratory symptoms (SOB or cough) since your procedure? no  3.   Have you tested positive for COVID 19 since your procedure no  4.   Have you had any family members/close contacts diagnosed with the COVID 19 since your procedure?  no   If yes to any of these questions please route to Joylene John, RN and Erenest Rasher, RN

## 2019-08-14 LAB — HELICOBACTER PYLORI CULTURE

## 2019-08-17 ENCOUNTER — Encounter (HOSPITAL_COMMUNITY): Payer: Self-pay | Admitting: *Deleted

## 2019-08-17 ENCOUNTER — Other Ambulatory Visit: Payer: Self-pay

## 2019-08-17 ENCOUNTER — Emergency Department (HOSPITAL_COMMUNITY)
Admission: EM | Admit: 2019-08-17 | Discharge: 2019-08-17 | Disposition: A | Discharge status: Home / Self Care | Payer: Medicare HMO | Attending: Emergency Medicine | Admitting: Emergency Medicine

## 2019-08-17 DIAGNOSIS — K0889 Other specified disorders of teeth and supporting structures: Secondary | ICD-10-CM | POA: Diagnosis not present

## 2019-08-17 DIAGNOSIS — Z85028 Personal history of other malignant neoplasm of stomach: Secondary | ICD-10-CM | POA: Diagnosis not present

## 2019-08-17 DIAGNOSIS — Z89021 Acquired absence of right finger(s): Secondary | ICD-10-CM | POA: Diagnosis not present

## 2019-08-17 DIAGNOSIS — J45909 Unspecified asthma, uncomplicated: Secondary | ICD-10-CM | POA: Insufficient documentation

## 2019-08-17 DIAGNOSIS — Z903 Acquired absence of stomach [part of]: Secondary | ICD-10-CM | POA: Insufficient documentation

## 2019-08-17 DIAGNOSIS — K0381 Cracked tooth: Secondary | ICD-10-CM | POA: Diagnosis not present

## 2019-08-17 DIAGNOSIS — K029 Dental caries, unspecified: Secondary | ICD-10-CM | POA: Insufficient documentation

## 2019-08-17 DIAGNOSIS — Z79899 Other long term (current) drug therapy: Secondary | ICD-10-CM | POA: Insufficient documentation

## 2019-08-17 DIAGNOSIS — Z87891 Personal history of nicotine dependence: Secondary | ICD-10-CM | POA: Diagnosis not present

## 2019-08-17 MED ORDER — PENICILLIN V POTASSIUM 250 MG PO TABS
500.0000 mg | ORAL_TABLET | Freq: Once | ORAL | Status: AC
  Administered 2019-08-17: 500 mg via ORAL
  Filled 2019-08-17: qty 2

## 2019-08-17 MED ORDER — PENICILLIN V POTASSIUM 500 MG PO TABS
500.0000 mg | ORAL_TABLET | Freq: Four times a day (QID) | ORAL | 0 refills | Status: AC
Start: 2019-08-17 — End: 2019-08-24

## 2019-08-17 NOTE — Discharge Instructions (Addendum)
I have prescribed you penicillin for your broken tooth.  You are going to take this 4 times a day for the next week.  If you develop worsening symptoms you can always return to the emergency department.  I would recommend following up with a dentist.  It was a pleasure to meet you.

## 2019-08-17 NOTE — ED Triage Notes (Signed)
Pt states he was eating and broke his upper left tooth. Took a percocet prior to arrival for pain, says he just needs some antibiotics.

## 2019-08-17 NOTE — ED Provider Notes (Signed)
[facility] EMERGENCY DEPARTMENT Provider Note   CSN: [id number] Arrival date & time: 08/17/19  2053     History Chief Complaint  Patient presents with  . Dental Pain    [name] is a 49 y.o. male.  HPI Patient is a 49 year old male with a medical history as noted below.  Patient states he was eating earlier today and felt one of his left upper molars break off while eating.  He reports moderate throbbing pain in the region.  He took a Percocet this afternoon which greatly resolved his pain.  His pain worsens when chewing.  He denies any facial swelling, fevers, chills, nausea, vomiting, chest pain, shortness of breath.    Past Medical History:  Diagnosis Date  . Abdominal pain    intermittent  . Allergy    SEASONAL  . Anemia   . 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 Asthma   . Gastric cancer ([place])   . GERD (gastroesophageal reflux disease)   . Pancreatitis   . PTSD (post-traumatic stress disorder) 2008  . Rectal polyp 01/09/2006   Hyperplastic  . Seizures ([place])    ADMITS TAKING MEDICATION FOR SEIZURE D/T ANEURYSM ,DO NOT KNOW IF HAD SEIZURE. 11/10/18    Patient Active Problem List   Diagnosis Date Noted  . Personal history of stomach cancer 04/19/2019  . Gastric cancer s/p distal gastrectomy 03/25/2019 03/25/2019  . Personal history of colonic polyps 01/29/2019  . History of pancreatitis 11/26/2017  . Hyponatremia   . Partial seizure ([place]) 03/05/2015  . History of seizures 03/05/2015  . Abdominal pain, chronic, epigastric 07/02/2014  . ABDOMINAL PAIN, GENERALIZED 03/07/2010  . KNEE PAIN, RIGHT 11/18/2009  . HYPERGLYCEMIA 09/08/2009  . ANKLE SPRAIN, LEFT 02/05/2009  . BACK STRAIN, LUMBAR 02/05/2009  . POLYURIA 10/28/2008  . ABDOMINAL PAIN, EPIGASTRIC 10/28/2008  . INSOMNIA, CHRONIC 10/10/2007  . DEPRESSION, CHRONIC 10/10/2007  . Asthma 10/10/2007  . WOUND INFECTION 10/10/2007  . History of alcohol abuse 04/15/2007  . 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 PANCREATITIS, CHRONIC 04/15/2007  . 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 arm surgery Left    repair of tendons "work injury cut"-"pinky finger numb"  . BURR HOLE Right 02/28/2015   Procedure: BURR HOLES FOR SUBDURAL HEMATOMA;  Surgeon: Newman Pies, MD;  Location: [facility];  Service: Neurosurgery;  Laterality: Right;  . CEREBRAL ANEURYSM REPAIR    . COLONOSCOPY  2016   colon polyps  . ESOPHAGOGASTRODUODENOSCOPY (EGD) WITH PROPOFOL N/A 01/13/2015   Procedure: ESOPHAGOGASTRODUODENOSCOPY (EGD) WITH PROPOFOL;  Surgeon: [name], MD;  Location: [facility];  Service: Endoscopy;  Laterality: N/A;  . ESOPHAGOGASTRODUODENOSCOPY (EGD) WITH PROPOFOL N/A 03/19/2019   Procedure: ESOPHAGOGASTRODUODENOSCOPY (EGD) WITH PROPOFOL;  Surgeon: [name], MD;  Location: [facility];  Service: Endoscopy;  Laterality: N/A;  . ESOPHAGOGASTRODUODENOSCOPY ENDOSCOPY    . EUS N/A 03/19/2019   Procedure: UPPER ENDOSCOPIC ULTRASOUND (EUS) RADIAL;  Surgeon: [name], MD;  Location: [facility];  Service: Endoscopy;  Laterality: N/A;  . HERNIA REPAIR     Umbilical  . LAPAROSCOPIC GASTRECTOMY  03/25/2019   LAPAROSCOPY DIAGNOSTIC (N/A )  . LAPAROSCOPY N/A 03/25/2019   Procedure: LAPAROSCOPY DIAGNOSTIC;  Surgeon: [name], MD;  Location: [place];  Service: General;  Laterality: N/A;  . LYMPH NODE BIOPSY N/A 03/25/2019   Procedure: Gastric Lymph Node Biopsy;  Surgeon: [name], MD;  Location:  [facility];  Service: General;  Laterality: N/A;  . PARTIAL GASTRECTOMY N/A 03/25/2019   Procedure: DISTAL SUBTOTAL GASTRECTOMY;  Surgeon: [name], MD;  Location: [place];  Service: General;  Laterality: N/A;  . SUBMUCOSAL INJECTION  03/19/2019   Procedure: SUBMUCOSAL INJECTION;  Surgeon: [name], MD;  Location: [facility];  Service: Endoscopy;;  . 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 UPPER  GASTROINTESTINAL ENDOSCOPY         Family History  Problem Relation Age of Onset  . Stroke Mother   . Hypertension Mother   . Diabetes Father   . Pancreatic cancer Maternal Uncle   . Colon cancer Maternal Uncle   . Cancer Other   . Diabetes Other   . Colon cancer Maternal Grandfather   . Colon cancer Paternal Grandfather   . Pancreatic cancer Cousin   . Esophageal cancer Neg Hx   . Rectal cancer Neg Hx   . Stomach cancer Neg Hx     Social History   Tobacco Use  . Smoking status: Former Smoker    Packs/day: 0.00    Types: Cigars    Quit date: 03/2019    Years since quitting: 0.4  . Smokeless tobacco: Never Used  . Tobacco comment: smokes a black and mild daily  Vaping Use  . Vaping Use: Never used  Substance Use Topics  . Alcohol use: No  . Drug use: Yes    Types: Marijuana    Comment: last use 3 days ago per per 08-07-19    Home Medications Prior to Admission medications   Medication Sig Start Date End Date Taking? Authorizing Provider  bismuth subsalicylate (PEPTO-BISMOL) 262 MG chewable tablet Chew 2 tablets (524 mg total) by mouth 4 (four) times daily. 03/04/19   [name], MD  [employer] STOMACH RELIEF 262 MG TABS Take 2 tablets by mouth 4 (four) times daily. 03/04/19   [provider]  Cyanocobalamin (B-12) 1000 MCG SUBL Place 1,000 mcg under the tongue daily.    [provider]  dicyclomine (BENTYL) 20 MG tablet Take 1 tablet (20 mg total) by mouth 4 (four) times daily -  before meals and at bedtime. Patient taking differently: Take 100 mg by mouth 2 (two) times daily.  11/26/17   Rodriguez-Southworth, Sunday Spillers, PA-C  docusate sodium (COLACE) 100 MG capsule TAKE 1 CAPSULE BY MOUTH TWICE A DAY Patient taking differently: Take 100 mg by mouth 2 (two) times daily.  01/05/19   Rodriguez-Southworth, Sunday Spillers, PA-C  fluticasone (FLONASE) 50 MCG/ACT nasal spray Place 1 spray into both nostrils daily as needed for allergies or rhinitis.    [provider]  folic acid (FOLVITE) 1 MG tablet TAKE 1 TABLET BY MOUTH EVERY DAY 04/01/19   Rodriguez-Southworth, Sunday Spillers, PA-C  HYDROcodone-acetaminophen (NORCO/VICODIN) 5-325 MG tablet Take 1 tablet by mouth every 4 (four) hours as needed. 07/26/19   Isla Pence, MD  levETIRAcetam (KEPPRA) 500 MG tablet Take 1 tablet (500 mg total) by mouth 2 (two) times daily. 08/03/19   Cameron Sprang, MD  loratadine (CLARITIN) 10 MG tablet Take 1 tablet (10 mg total) by mouth daily. 01/29/19   Rodriguez-Southworth, Sunday Spillers, PA-C  methocarbamol (ROBAXIN) 500 MG tablet Take 1 tablet (500 mg total) by mouth every 8 (eight) hours as needed for muscle spasms. 03/26/19   [name], MD  metoCLOPramide (REGLAN) 10 MG tablet TAKE 1 TO 2 TABLETS BY MOUTH EVERY DAY AT BEDTIME Patient taking differently: Take 20 mg  by mouth at bedtime.  02/16/19   Rodriguez-Southworth, Sunday Spillers, PA-C  Multiple Vitamin (MULTIVITAMIN WITH MINERALS) TABS tablet Take 1 tablet by mouth daily. 03/31/19   Maczis, Barth Kirks, PA-C  omeprazole (PRILOSEC) 20 MG capsule Take 1 capsule (20 mg total) by mouth 2 (two) times daily before a meal. 03/04/19   [name], MD  ondansetron (ZOFRAN ODT) 4 MG disintegrating tablet Take 1 tablet (4 mg total) by mouth every 8 (eight) hours as needed. 07/26/19   Isla Pence, MD  ondansetron (ZOFRAN) 8 MG tablet Take 1 tablet (8 mg total) by mouth every 8 (eight) hours as needed for nausea or vomiting. 03/26/19   [name], MD  oxyCODONE (OXY IR/ROXICODONE) 5 MG immediate release tablet Take 1-2 tablets (5-10 mg total) by mouth every 6 (six) hours as needed. 03/26/19   [name], MD  tetrahydrozoline-zinc (VISINE-AC) 0.05-0.25 % ophthalmic solution Place 2 drops into both eyes 2 (two) times daily as needed (Itching).     [provider]  thiamine (VITAMIN B-1) 100 MG tablet TAKE 1 TABLET BY MOUTH EVERY DAY Patient taking differently: Take 100 mg by mouth daily.  01/05/19   Rodriguez-Southworth,  Sunday Spillers, PA-C    Allergies    Aspirin, Ibuprofen, Hydrocodone, and Tylenol [acetaminophen]  Review of Systems   Review of Systems  Constitutional: Negative for chills and fever.  HENT: Positive for dental problem. Negative for drooling, facial swelling and trouble swallowing.   Respiratory: Negative for shortness of breath.   Cardiovascular: Negative for chest pain.  Gastrointestinal: Negative for nausea and vomiting.   Physical Exam Updated Vital Signs BP 107/70 (BP Location: Right Arm)   Pulse 62   Temp 98.5 F (36.9 C) (Oral)   Resp 18   SpO2 100%   Physical Exam Vitals and nursing note reviewed.  Constitutional:      General: He is not in acute distress.    Appearance: He is well-developed.  HENT:     Head: Normocephalic and atraumatic.     Right Ear: External ear normal.     Left Ear: External ear normal.     Mouth/Throat:     Mouth: Mucous membranes are moist.     Pharynx: Oropharynx is clear. No oropharyngeal exudate or posterior oropharyngeal erythema.     Comments: Diffuse dental caries and fractured teeth noted in the oropharynx.  Fractured left upper third molar noted.  Moderate TTP overlying the site.  Mild redness at the site of the fractured tooth with no active bleeding.  No palpable fluctuance.  Patient is readily handling secretions.  Uvula is midline.  Patient is phonating clearly and coherently.  No hot potato voice. Eyes:     General: No scleral icterus.       Right eye: No discharge.        Left eye: No discharge.     Conjunctiva/sclera: Conjunctivae normal.  Neck:     Trachea: No tracheal deviation.  Cardiovascular:     Rate and Rhythm: Normal rate.  Pulmonary:     Effort: Pulmonary effort is normal. No respiratory distress.     Breath sounds: No stridor.  Abdominal:     General: There is no distension.  Musculoskeletal:        General: No swelling or deformity.     Cervical back: Neck supple.  Skin:    General: Skin is warm and dry.      Findings: No rash.  Neurological:     Mental Status: He is  alert.     Cranial Nerves: Cranial nerve deficit: no gross deficits.    ED Results / Procedures / Treatments   Labs (all labs ordered are listed, but only abnormal results are displayed) Labs Reviewed - No data to display  EKG None  Radiology No results found.  Procedures Procedures (including critical care time)  Medications Ordered in ED Medications  penicillin v potassium (VEETID) tablet 500 mg (has no administration in time range)    ED Course  I have reviewed the triage vital signs and the nursing notes.  Pertinent labs & imaging results that were available during my care of the patient were reviewed by me and considered in my medical decision making (see chart for details).    MDM Rules/Calculators/A&P                          Patient is a 49 year old male with a history and physical exam as noted above.  Patient presents today due to a fractured tooth.  He request antibiotics at this time.  Will prescribe the patient penicillin VK.  We will give him his first dose here in the emergency department.  Recommended continued pain management with Tylenol and ibuprofen.  His questions were answered and he was amicable at the time of discharge.  His vital signs are stable.  He is afebrile at this time.  Not tachycardic.  Patient discharged to home/self care.  Condition at discharge: Stable  Note: Portions of this report may have been transcribed using voice recognition software. Every effort was made to ensure accuracy; however, inadvertent computerized transcription errors may be present.    Final Clinical Impression(s) / ED Diagnoses Final diagnoses:  Pain, dental    Rx / DC Orders ED Discharge Orders         Ordered    penicillin v potassium (VEETID) 500 MG tablet  4 times daily     Discontinue  Reprint     08/17/19 2133           Rayna Sexton, PA-C 08/17/19 2136    Lennice Sites,  DO 08/17/19 2152

## 2019-08-18 ENCOUNTER — Other Ambulatory Visit: Payer: Self-pay | Admitting: Physician Assistant

## 2019-08-18 NOTE — Telephone Encounter (Signed)
Ok to refill Pepcid 40 mg BID? Do not see mention specifically on your last office note or recent procedure note. Please advise. Thank you.

## 2019-08-19 ENCOUNTER — Telehealth: Payer: Self-pay

## 2019-09-07 ENCOUNTER — Ambulatory Visit: Payer: Medicare HMO | Admitting: Neurology

## 2019-09-09 ENCOUNTER — Other Ambulatory Visit: Payer: Self-pay

## 2019-09-09 ENCOUNTER — Encounter: Payer: Medicare HMO | Attending: General Surgery | Admitting: Skilled Nursing Facility1

## 2019-09-09 DIAGNOSIS — Z713 Dietary counseling and surveillance: Secondary | ICD-10-CM | POA: Insufficient documentation

## 2019-09-09 DIAGNOSIS — Z903 Acquired absence of stomach [part of]: Secondary | ICD-10-CM | POA: Diagnosis not present

## 2019-09-09 DIAGNOSIS — C169 Malignant neoplasm of stomach, unspecified: Secondary | ICD-10-CM | POA: Diagnosis not present

## 2019-09-09 DIAGNOSIS — R634 Abnormal weight loss: Secondary | ICD-10-CM

## 2019-09-09 NOTE — Progress Notes (Signed)
  Assessment:  Primary concerns today: gastrectomy.   Pt states he is no longer having emesis. Pt states he is enjoying the bariatric multivitamin. Pt states he has one normal bowel movement a day with no diarrhea reported. Activity level includes only ADL's.   Pts recall includes 3100 calories and at 500 calories increased per day giving 1 pound per week would have increased wt by 6 pounds given pts calories being increased by at minimum 1000 would have expected at minimim 12 pounds weight gain since previous visit.   Pt with wasted appearance.   Body Composition Scale 07/14/2019 09/09/2019  Current Body Weight 141.3 147.3   Total Body Fat % 7.5 6.5  Visceral Fat 1 2  Fat-Free Mass % 92.5 93.5   Total Body Water % 73 73  Muscle-Mass lbs 27.1 29.7  BMI 18 18.8  Body Fat Displacement           Torso  lbs 6.5 5.9         Left Leg  lbs 1.3 1.1         Right Leg  lbs 1.3 1.1         Left Arm  lbs .6 .5         Right Arm   lbs .6 .5    MEDICATIONS: pepto, b-12, thiamine, bentyl, colace, folic acid, keppra, reglan, multivitamin-nature made, omeprezole, zofran.    DIETARY INTAKE:  Usual eating pattern includes 3 meals and 1 snacks per day.  Everyday foods include none stated.  Avoided foods include none stated.    24-hr recall:  3100 calories calculated  B ( 8-9 AM): 4 eggs, 3 grits, 2 slices toast + butter, 2 slices bacon or 2 sausage  Snk ( AM):  L ( 12-1 PM): 12 insub  Snk ( PM): salad" lettuce, spinach, tomatoes, croutons, bacon bits, cheese: [place] D ( PM): 3-4 chicken wings  Snk ( PM): 4-5 ice cream sandwiches  Beverages: water, coffee + sugar + flavored creamer, gatorade, body armor, gingerale, lemonade, apple juice  Usual physical activity: ADL's  Estimated energy needs: 2400 calories 270 g carbohydrates 180 g protein 67g fat  Progress Towards Goal(s):  In progress.     Intervention:  Nutrition counseling. Dietitian educated pt on  Goals: -if your  weight decreases or you begin to vomit or have diarrhea include 2 meal replacements per day  Teaching Method Utilized:  Visual Auditory Hands on   Barriers to learning/adherence to lifestyle change: GI dysfunction   Demonstrated degree of understanding via:  Teach Back   Monitoring/Evaluation:  Dietary intake, exercise, and body weight prn.

## 2019-09-24 ENCOUNTER — Other Ambulatory Visit: Payer: Self-pay

## 2019-09-24 ENCOUNTER — Other Ambulatory Visit: Payer: Self-pay | Admitting: Internal Medicine

## 2019-10-06 ENCOUNTER — Telehealth: Payer: Self-pay

## 2019-10-07 ENCOUNTER — Telehealth: Payer: Self-pay

## 2019-10-07 ENCOUNTER — Telehealth: Payer: Medicare HMO

## 2019-10-07 NOTE — Telephone Encounter (Signed)
°  Chronic Care Management   Outreach Note  10/07/2019 Name: [name] MRN: [id number] DOB: [birth year]  Referred by: Shelby Mattocks, PA-C Reason for referral : Care Coordination   An unsuccessful telephone outreach was attempted today. The patient was referred to the case management team for assistance with care management and care coordination.   Follow Up Plan: A HIPPA compliant phone message was left for the patient providing contact information and requesting a return call.  The care management team will reach out to the patient again over the next 30 days.   [name], BSW, CDP Social Worker, Certified Dementia Practitioner [facility] [phone]

## 2019-10-27 ENCOUNTER — Telehealth: Payer: Self-pay

## 2019-10-27 ENCOUNTER — Telehealth: Payer: Medicare HMO

## 2019-10-27 NOTE — Telephone Encounter (Cosign Needed)
  Chronic Care Management   Outreach Note  10/27/2019 Name: [name] MRN: [id number] DOB: 30-Nov-1970  Referred by: No primary care provider on file. Reason for referral : Chronic Care Management (FU RN CM Call )   A second unsuccessful telephone outreach was attempted today. The patient was referred to the case management team for assistance with care management and care coordination.   Follow Up Plan: A HIPAA compliant phone message was left for the patient providing contact information and requesting a return call.  Telephone follow up appointment with care management team member scheduled for: 12/01/19  Barb Merino, RN, BSN, CCM Care Management Coordinator [place]  Direct Phone: [phone]

## 2019-10-29 ENCOUNTER — Encounter: Payer: Self-pay | Admitting: Nurse Practitioner

## 2019-10-29 ENCOUNTER — Ambulatory Visit: Payer: Medicare HMO | Admitting: Internal Medicine

## 2019-10-29 ENCOUNTER — Ambulatory Visit (INDEPENDENT_AMBULATORY_CARE_PROVIDER_SITE_OTHER): Payer: Medicare HMO | Admitting: Nurse Practitioner

## 2019-10-29 ENCOUNTER — Other Ambulatory Visit: Payer: Self-pay

## 2019-10-29 VITALS — BP 116/72 | HR 68 | Temp 98.1°F | Ht 73.0 in | Wt 151.4 lb

## 2019-10-29 DIAGNOSIS — R1084 Generalized abdominal pain: Secondary | ICD-10-CM

## 2019-10-29 DIAGNOSIS — Z1159 Encounter for screening for other viral diseases: Secondary | ICD-10-CM | POA: Diagnosis not present

## 2019-10-29 DIAGNOSIS — Z0000 Encounter for general adult medical examination without abnormal findings: Secondary | ICD-10-CM

## 2019-10-29 DIAGNOSIS — Z23 Encounter for immunization: Secondary | ICD-10-CM

## 2019-10-29 NOTE — Patient Instructions (Signed)
Influenza Virus Vaccine (Flucelvax) What is this medicine? INFLUENZA VIRUS VACCINE (in floo EN zuh VAHY ruhs vak SEEN) helps to reduce the risk of getting influenza also known as the flu. The vaccine only helps protect you against some strains of the flu. This medicine may be used for other purposes; ask your health care provider or pharmacist if you have questions. COMMON BRAND NAME(S): FLUCELVAX What should I tell my health care provider before I take this medicine? They need to know if you have any of these conditions:  bleeding disorder like hemophilia  fever or infection  Guillain-Barre syndrome or other neurological problems  immune system problems  infection with the human immunodeficiency virus (HIV) or AIDS  low blood platelet counts  multiple sclerosis  an unusual or allergic reaction to influenza virus vaccine, other medicines, foods, dyes or preservatives  pregnant or trying to get pregnant  breast-feeding How should I use this medicine? This vaccine is for injection into a muscle. It is given by a health care professional. A copy of Vaccine Information Statements will be given before each vaccination. Read this sheet carefully each time. The sheet may change frequently. Talk to your pediatrician regarding the use of this medicine in children. Special care may be needed. Overdosage: If you think you've taken too much of this medicine contact a poison control center or emergency room at once. Overdosage: If you think you have taken too much of this medicine contact a poison control center or emergency room at once. NOTE: This medicine is only for you. Do not share this medicine with others. What if I miss a dose? This does not apply. What may interact with this medicine?  chemotherapy or radiation therapy  medicines that lower your immune system like etanercept, anakinra, infliximab, and adalimumab  medicines that treat or prevent blood clots like  warfarin  phenytoin  steroid medicines like prednisone or cortisone  theophylline  vaccines This list may not describe all possible interactions. Give your health care provider a list of all the medicines, herbs, non-prescription drugs, or dietary supplements you use. Also tell them if you smoke, drink alcohol, or use illegal drugs. Some items may interact with your medicine. What should I watch for while using this medicine? Report any side effects that do not go away within 3 days to your doctor or health care professional. Call your health care provider if any unusual symptoms occur within 6 weeks of receiving this vaccine. You may still catch the flu, but the illness is not usually as bad. You cannot get the flu from the vaccine. The vaccine will not protect against colds or other illnesses that may cause fever. The vaccine is needed every year. What side effects may I notice from receiving this medicine? Side effects that you should report to your doctor or health care professional as soon as possible:  allergic reactions like skin rash, itching or hives, swelling of the face, lips, or tongue Side effects that usually do not require medical attention (Report these to your doctor or health care professional if they continue or are bothersome.):  fever  headache  muscle aches and pains  pain, tenderness, redness, or swelling at the injection site  tiredness This list may not describe all possible side effects. Call your doctor for medical advice about side effects. You may report side effects to FDA at 1-800-FDA-1088. Where should I keep my medicine? The vaccine will be given by a health care professional in a clinic, pharmacy, doctor's   office, or other health care setting. You will not be given vaccine doses to store at home. NOTE: This sheet is a summary. It may not cover all possible information. If you have questions about this medicine, talk to your doctor, pharmacist, or  health care provider.  2020 Elsevier/Gold Standard (2011-01-10 14:06:47)  

## 2019-10-29 NOTE — Progress Notes (Addendum)
I,Yamilka Roman [employer] as a Education administrator for [employer], FNP.,have documented all relevant documentation on the behalf of [name], FNP,as directed by  [name], FNP while in the presence of [name], [place].  This visit occurred during the SARS-CoV-2 public health emergency.  Safety protocols were in place, including screening questions prior to the visit, additional usage of staff PPE, and extensive cleaning of exam room while observing appropriate contact time as indicated for disinfecting solutions.  Subjective:     Patient ID: [name] , male    DOB: 07/01/1970 , 49 y.o.   MRN: [id number]   Chief Complaint  Patient presents with  . follow up    HPI  Here for follow up abdominal pain.  He continues to have intermittent abdominal pain but has improved significantly.  He continues to see Dr. Channing Mutters - surgeon for his stomach tumor which was removed completely.  He is being followed by Dr. Delice Lesch - Neurology He reports had both covid vaccines but does not have the dates with him   Wt Readings from Last 3 Encounters: 10/29/19 : 151 lb 6.4 oz (68.7 kg) 09/09/19 : 147 lb 4.8 oz (66.8 kg) 08/07/19 : 147 lb (66.7 kg)       Past Medical History:  Diagnosis Date  . Abdominal pain    intermittent  . Allergy    SEASONAL  . Anemia   . 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 Asthma   . Gastric cancer ([place])   . GERD (gastroesophageal reflux disease)   . Pancreatitis   . PTSD (post-traumatic stress disorder) 2008  . Rectal polyp 01/09/2006   Hyperplastic  . Seizures ([place])    ADMITS TAKING MEDICATION FOR SEIZURE D/T ANEURYSM ,DO NOT KNOW IF HAD SEIZURE. 11/10/18     Family History  Problem Relation Age of Onset  . Stroke Mother   . Hypertension Mother   . Diabetes Father   . Pancreatic cancer Maternal Uncle   . Colon cancer Maternal Uncle   . Cancer Other   . Diabetes Other   . Colon cancer Maternal Grandfather   . Colon cancer Paternal Grandfather   . Pancreatic  cancer Cousin   . Esophageal cancer Neg Hx   . Rectal cancer Neg Hx   . Stomach cancer Neg Hx      Current Outpatient Medications:  .  bismuth subsalicylate (PEPTO-BISMOL) 262 MG chewable tablet, Chew 2 tablets (524 mg total) by mouth 4 (four) times daily., Disp: 80 tablet, Rfl: 0 .  [employer] STOMACH RELIEF 262 MG TABS, Take 2 tablets by mouth 4 (four) times daily., Disp: , Rfl:  .  Cyanocobalamin (B-12) 1000 MCG SUBL, Place 1,000 mcg under the tongue daily., Disp: , Rfl:  .  dicyclomine (BENTYL) 20 MG tablet, Take 1 tablet (20 mg total) by mouth 4 (four) times daily -  before meals and at bedtime. (Patient taking differently: Take 100 mg by mouth 2 (two) times daily. ), Disp: 90 tablet, Rfl: 1 .  docusate sodium (COLACE) 100 MG capsule, TAKE 1 CAPSULE BY MOUTH TWICE A DAY (Patient taking differently: Take 100 mg by mouth 2 (two) times daily. ), Disp: 60 capsule, Rfl: 2 .  famotidine (PEPCID) 40 MG tablet, TAKE 1 TABLET BY MOUTH TWICE A DAY, Disp: 180 tablet, Rfl: 1 .  fluticasone (FLONASE) 50 MCG/ACT nasal spray, Place 1 spray into both nostrils daily as needed for allergies or rhinitis., Disp: , Rfl:  .  folic acid (FOLVITE)  1 MG tablet, TAKE 1 TABLET BY MOUTH EVERY DAY, Disp: 90 tablet, Rfl: 0 .  levETIRAcetam (KEPPRA) 500 MG tablet, Take 1 tablet (500 mg total) by mouth 2 (two) times daily., Disp: 180 tablet, Rfl: 3 .  loratadine (CLARITIN) 10 MG tablet, Take 1 tablet (10 mg total) by mouth daily., Disp: 30 tablet, Rfl: 5 .  methocarbamol (ROBAXIN) 500 MG tablet, Take 1 tablet (500 mg total) by mouth every 8 (eight) hours as needed for muscle spasms., Disp: 30 tablet, Rfl: 1 .  Multiple Vitamin (MULTIVITAMIN WITH MINERALS) TABS tablet, Take 1 tablet by mouth daily., Disp:  , Rfl:  .  omeprazole (PRILOSEC) 20 MG capsule, Take 1 capsule (20 mg total) by mouth 2 (two) times daily before a meal., Disp: 20 capsule, Rfl: 0 .  ondansetron (ZOFRAN ODT) 4 MG disintegrating tablet, Take 1 tablet (4 mg  total) by mouth every 8 (eight) hours as needed., Disp: 10 tablet, Rfl: 0 .  oxyCODONE (OXY IR/ROXICODONE) 5 MG immediate release tablet, Take 1-2 tablets (5-10 mg total) by mouth every 6 (six) hours as needed., Disp: 30 tablet, Rfl: 0 .  tetrahydrozoline-zinc (VISINE-AC) 0.05-0.25 % ophthalmic solution, Place 2 drops into both eyes 2 (two) times daily as needed (Itching). , Disp: , Rfl:  .  thiamine (VITAMIN B-1) 100 MG tablet, TAKE 1 TABLET BY MOUTH EVERY DAY (Patient taking differently: Take 100 mg by mouth daily. ), Disp: 100 tablet, Rfl: 1 .  metoCLOPramide (REGLAN) 10 MG tablet, TAKE 1 TO 2 TABLETS BY MOUTH EVERY DAY AT BEDTIME (Patient not taking: Reported on 10/29/2019), Disp: 60 tablet, Rfl: 0   Allergies  Allergen Reactions  . Aspirin Anaphylaxis  . Ibuprofen Anaphylaxis  . Hydrocodone Hives and Itching  . Tylenol [Acetaminophen]     Upset stomach     Review of Systems  Constitutional: Negative.  Negative for fatigue.  Respiratory: Negative.  Negative for shortness of breath.   Cardiovascular: Negative.  Negative for chest pain, palpitations and leg swelling.  Gastrointestinal: Positive for abdominal pain (intermittent abdominal pain).  Neurological: Negative.   Psychiatric/Behavioral: Negative.      Today's Vitals   10/29/19 1448  BP: 116/72  Pulse: 68  Temp: 98.1 F (36.7 C)  Weight: 151 lb 6.4 oz (68.7 kg)  Height: 6\' 1"  (1.854 m)  PainSc: 9   PainLoc: Abdomen   Body mass index is 19.97 kg/m.   Objective:  Physical Exam Constitutional:      General: He is not in acute distress.    Appearance: Normal appearance.     Comments: Thin frame  Cardiovascular:     Rate and Rhythm: Normal rate and regular rhythm.     Pulses: Normal pulses.     Heart sounds: Normal heart sounds. No murmur heard.   Pulmonary:     Effort: Pulmonary effort is normal. No respiratory distress.     Breath sounds: Normal breath sounds.  Abdominal:     General: Abdomen is flat. There is  no distension.     Palpations: Abdomen is soft.     Tenderness: There is no abdominal tenderness.  Neurological:     General: No focal deficit present.     Mental Status: He is alert and oriented to person, place, and time.     Cranial Nerves: No cranial nerve deficit.  Psychiatric:        Mood and Affect: Mood normal.        Behavior: Behavior normal.  Thought Content: Thought content normal.        Judgment: Judgment normal.         Assessment And Plan:     1. Abdominal pain, generalized  Chronic, he continues to have intermittent abdomen pain but feels has improved significantly.  Continue follow up with Dr. Channing Mutters  2. Encounter for hepatitis C screening test for low risk patient  Will check Hepatitis C screening due to recent recommendations to screen all adults 18 years and older - Hepatitis C antibody  2. Encounter for immunization  Influenza vaccine administered  Encouraged to take Tylenol as needed for fever or muscle aches. - Flu Vaccine QUAD 6+ mos PF IM (Fluarix Quad PF)  3. Personal history of stomach cancer  He is being followed by GI   He will provide [place] the dates of his Covid vaccine  Patient was given opportunity to ask questions. Patient verbalized understanding of the plan and was able to repeat key elements of the plan. All questions were answered to their satisfaction.    Teola Bradley, FNP, have reviewed all documentation for this visit. The documentation on 11/01/19 for the exam, diagnosis, procedures, and orders are all accurate and complete.  THE PATIENT IS ENCOURAGED TO PRACTICE SOCIAL DISTANCING DUE TO THE COVID-19 PANDEMIC.

## 2019-10-30 LAB — HEPATITIS C ANTIBODY: Hep C Virus Ab: 0.2 s/co ratio (ref 0.0–0.9)

## 2019-11-06 ENCOUNTER — Other Ambulatory Visit: Payer: Self-pay | Admitting: Physician Assistant

## 2019-12-01 ENCOUNTER — Telehealth: Payer: Medicare HMO

## 2019-12-02 ENCOUNTER — Ambulatory Visit: Payer: Self-pay

## 2019-12-02 ENCOUNTER — Telehealth: Payer: Medicare HMO

## 2019-12-02 ENCOUNTER — Other Ambulatory Visit: Payer: Self-pay

## 2019-12-02 DIAGNOSIS — J45909 Unspecified asthma, uncomplicated: Secondary | ICD-10-CM

## 2019-12-02 DIAGNOSIS — G8929 Other chronic pain: Secondary | ICD-10-CM

## 2019-12-02 DIAGNOSIS — Z0000 Encounter for general adult medical examination without abnormal findings: Secondary | ICD-10-CM

## 2019-12-02 DIAGNOSIS — R569 Unspecified convulsions: Secondary | ICD-10-CM

## 2019-12-03 NOTE — Patient Instructions (Signed)
Visit Information  Goals Addressed      Patient Stated   .  "I may need help affording my medications in the near future" (pt-stated)   On track     [place] (see longtitudinal plan of care for additional care plan information)  Current Barriers:  [name] Knowledge Deficits related to pharmacy resources available to assist with drug cost . Chronic Disease Management support and education needs related to Malignant Neoplasm of stomach, History of Seizures, Asthma, Chronic pain   Nurse Case Manager Clinical Goal(s):  [name] Over the next 6 months, patient will demonstrate ongoing adherence to prescribed treatment plan for medication management as evidenced by patient will not run out of this medications or miss doses of his medications due to not being able to afford them  CCM RN CM Interventions:  12/02/19 call completed with patient  . Evaluation of current treatment plan related to Medication management including adherence and affordability  and patient's adherence to plan as established by provider. . Reviewed medications with patient and discussed patient may need resources to help with medication cost in the near future; patient is unable to verbalize the names of the medications he may need help with during this call  . Collaborated with embedded Pharm regarding patient's need for pharmacy resources to help with drug cost . Discussed plans with patient for ongoing care management follow up and provided patient with direct contact information for care management team . Pharmacy referral for assistance with resources that may help with drug cost due to patient reported expected hardship in the near future  Patient Self Care Activities:  . Self administers medications as prescribed . Attends all scheduled provider appointments . Calls pharmacy for medication refills . Calls provider office for new concerns or questions  Please see past updates related to this goal by clicking on the "Past  Updates" button in the selected goal      .  "to have less fatigue" (pt-stated)        CARE PLAN ENTRY (see longitudinal plan of care for additional care plan information)  Current Barriers:  [name] Knowledge Deficits related to evaluation and treatment of Vitamin D deficiency  . Chronic Disease Management support and education needs related to Asthma, chronic pain, personal history of stomach cancer, partial Seizure    Nurse Case Manager Clinical Goal(s):  [name] Over the next 6 months, patient will work with the CCM team and PCP to address needs related to evaluation and treatment of Vitamin D deficiency   CCM RN CM Interventions:  12/02/19 call completed with patient  . Inter-disciplinary care team collaboration (see longitudinal plan of care) . Evaluation of current treatment plan related to Vitamin D deficiency and patient's adherence to plan as established by provider. . Provided education to patient re: potential cause and effect of Vitamin D deficiency; Educated patient on treatment and dietary recommendations; Educated on target Vitamin D range for optimal health  . Reviewed medications with patient and discussed patient is currently not taking a Vitamin D supplement  . Collaborated with PCP provider [name], FNP  regarding patient's c/o symptoms suggestive of Vitamin D deficiency and patient's request to have this checked at next appointment  . Discussed plans with patient for ongoing care management follow up and provided patient with direct contact information for care management team  Patient Self Care Activities:  . Self administers medications as prescribed . Attends all scheduled provider appointments . Calls pharmacy for medication refills . Calls  provider office for new concerns or questions  Initial CM RN clinical goal     .  COMPLETED: "to have no Seizures" (pt-stated)        CARE PLAN ENTRY (see longtitudinal plan of care for additional care plan information)   Current Barriers:  [name] Knowledge Deficits related to disease process and Self Health management of Seizures . Chronic Disease Management support and education needs related to History of Seizures, Asthma, Chronic pain   Nurse Case Manager Clinical Goal(s):  [name] Over the next 90 days, the patient will demonstrate ongoing self health care management ability as evidenced by patient will verbalize having no Seizure activity *  CCM RN CM Interventions:  12/02/19 spoke to patient  . Evaluation of current treatment plan related to Seizures  and patient's adherence to plan as established by provider . Reviewed medications with patient and discussed indication, dosage and frequency of his prescribed anti-seizure medication and patient reports understanding and adherence with no noted SE . Determined patient has not experienced any Seizure activity while taking Keppra as prescribed, he is being followed by Dr. Delice Lesch - Neurology . Discussed plans with patient for ongoing care management follow up and provided patient with direct contact information for care management team  Patient Self Care Activities:  . Self administers medications as prescribed . Attends all scheduled provider appointments . Calls pharmacy for medication refills . Calls provider office for new concerns or questions  Please see past updates related to this goal by clicking on the "Past Updates" button in the selected goal      .  COMPLETED: "to heal from stomach surgery" (pt-stated)        CARE PLAN ENTRY (see longtitudinal plan of care for additional care plan information)  Current Barriers:  [name] Knowledge Deficits related to pathology results for removal of lymph nodes and ongoing treatment management of Malignant Neoplasm of stomach . Chronic Disease Management support and education needs related to Malignant Neoplasm of stomach, Asthma, Chronic Pain  . Risk for Nutritional Imbalance   Nurse Case Manager Clinical Goal(s):  [name]  Over the next 60 days, patient will verbalize understanding of plan for ongoing MD follow up and management of Malignant Neoplasm of stomach . Over the next 90 days, patient will verbalize complete healing from his partial gastrectomy without complications  . Over the next 90 days, patient will demonstrate diet progression as directed by MD w/o complications such as bowel obstruction, dehydration, malnutrition and or extreme weight loss   CCM RN CM Interventions:  12/02/19 spoke to patient  . Assessed for s/s suggestive of depression and or anxiety following recent diagnosis and surgery for Malignant Neoplasm of the stomach . Determined patient is not experiencing depression or anxiety at this time despite his recent change with his health . Evaluation of current treatment plan related to s/p distal Gastrectomy and patient's adherence to plan as established by provider . Determined patient continues to have intermittent abdominal pain but has improved significantly.  He continues to see Dr. Channing Mutters - surgeon for his stomach tumor which was removed completely  . Discussed plans with patient for ongoing care management follow up and provided patient with direct contact information for care management team  Patient Self Care Activities:  . Self administers medications as prescribed . Attends all scheduled provider appointments . Calls pharmacy for medication refills . Calls provider office for new concerns or questions  Please see past updates related to this goal by clicking on the "  Past Updates" button in the selected goal         Patient verbalizes understanding of instructions provided today.   Telephone follow up appointment with care management team member scheduled for: 04/26/19  Barb Merino, RN, BSN, CCM Care Management Coordinator [facility]  Direct Phone: [phone]

## 2019-12-03 NOTE — Chronic Care Management (AMB) (Signed)
Chronic Care Management   Follow Up Note   12/02/2019 Name: [name] MRN: [id number] DOB: February 10, 1971  Referred by: [name], FNP Reason for referral : Chronic Care Management (CCM RNCM FU Call )   [name] is a 49 y.o. year old male who is a primary care patient of [name], [place]. The CCM team was consulted for assistance with chronic disease management and care coordination needs.    Review of patient status, including review of consultants reports, relevant laboratory and other test results, and collaboration with appropriate care team members and the patient's provider was performed as part of comprehensive patient evaluation and provision of chronic care management services.    SDOH (Social Determinants of Health) assessments performed: Yes - no acute challenges identified at this time  See Care Plan activities for detailed interventions related to [place])   Placed outbound CCM RN CM follow up call with patient for a care plan update.     Outpatient Encounter Medications as of 12/02/2019  Medication Sig  . bismuth subsalicylate (PEPTO-BISMOL) 262 MG chewable tablet Chew 2 tablets (524 mg total) by mouth 4 (four) times daily.  . [employer] STOMACH RELIEF 262 MG TABS Take 2 tablets by mouth 4 (four) times daily.  . Cyanocobalamin (B-12) 1000 MCG SUBL Place 1,000 mcg under the tongue daily.  [name] dicyclomine (BENTYL) 20 MG tablet Take 1 tablet (20 mg total) by mouth 4 (four) times daily -  before meals and at bedtime. (Patient taking differently: Take 100 mg by mouth 2 (two) times daily. )  . docusate sodium (COLACE) 100 MG capsule TAKE 1 CAPSULE BY MOUTH TWICE A DAY (Patient taking differently: Take 100 mg by mouth 2 (two) times daily. )  . famotidine (PEPCID) 40 MG tablet TAKE 1 TABLET BY MOUTH TWICE A DAY  . fluticasone (FLONASE) 50 MCG/ACT nasal spray Place 1 spray into both nostrils daily as needed for allergies or rhinitis.  . folic acid (FOLVITE) 1 MG tablet  TAKE 1 TABLET BY MOUTH EVERY DAY  . levETIRAcetam (KEPPRA) 500 MG tablet Take 1 tablet (500 mg total) by mouth 2 (two) times daily.  [name] loratadine (CLARITIN) 10 MG tablet Take 1 tablet (10 mg total) by mouth daily.  . methocarbamol (ROBAXIN) 500 MG tablet Take 1 tablet (500 mg total) by mouth every 8 (eight) hours as needed for muscle spasms.  . metoCLOPramide (REGLAN) 10 MG tablet TAKE 1 TO 2 TABLETS BY MOUTH EVERY DAY AT BEDTIME (Patient not taking: Reported on 10/29/2019)  . Multiple Vitamin (MULTIVITAMIN WITH MINERALS) TABS tablet Take 1 tablet by mouth daily.  [name] omeprazole (PRILOSEC) 20 MG capsule Take 1 capsule (20 mg total) by mouth 2 (two) times daily before a meal.  . ondansetron (ZOFRAN ODT) 4 MG disintegrating tablet Take 1 tablet (4 mg total) by mouth every 8 (eight) hours as needed.  [name] oxyCODONE (OXY IR/ROXICODONE) 5 MG immediate release tablet Take 1-2 tablets (5-10 mg total) by mouth every 6 (six) hours as needed.  . pantoprazole (PROTONIX) 40 MG tablet TAKE 1 TABLET BY MOUTH TWICE A DAY  . tetrahydrozoline-zinc (VISINE-AC) 0.05-0.25 % ophthalmic solution Place 2 drops into both eyes 2 (two) times daily as needed (Itching).   . thiamine (VITAMIN B-1) 100 MG tablet TAKE 1 TABLET BY MOUTH EVERY DAY (Patient taking differently: Take 100 mg by mouth daily. )   No facility-administered encounter medications on file as of 12/02/2019.     Objective:  Lab Results  Component  Value Date   HGBA1C 5.4 11/26/2017   HGBA1C 5.7 (H) 03/06/2015   Lab Results  Component Value Date   LDLCALC 70 04/28/2019   CREATININE 0.96 07/26/2019   BP Readings from Last 3 Encounters:  10/29/19 116/72  08/17/19 107/70  08/07/19 134/76    Goals Addressed      Patient Stated   .  "I may need help affording my medications in the near future" (pt-stated)   On track     [place] (see longtitudinal plan of care for additional care plan information)  Current Barriers:  [name] Knowledge Deficits  related to pharmacy resources available to assist with drug cost . Chronic Disease Management support and education needs related to Malignant Neoplasm of stomach, History of Seizures, Asthma, Chronic pain   Nurse Case Manager Clinical Goal(s):  [name] Over the next 6 months, patient will demonstrate ongoing adherence to prescribed treatment plan for medication management as evidenced by patient will not run out of this medications or miss doses of his medications due to not being able to afford them  CCM RN CM Interventions:  12/02/19 call completed with patient  . Evaluation of current treatment plan related to Medication management including adherence and affordability  and patient's adherence to plan as established by provider. . Reviewed medications with patient and discussed patient may need resources to help with medication cost in the near future; patient is unable to verbalize the names of the medications he may need help with during this call  . Collaborated with embedded Pharm regarding patient's need for pharmacy resources to help with drug cost . Discussed plans with patient for ongoing care management follow up and provided patient with direct contact information for care management team . Pharmacy referral for assistance with resources that may help with drug cost due to patient reported expected hardship in the near future  Patient Self Care Activities:  . Self administers medications as prescribed . Attends all scheduled provider appointments . Calls pharmacy for medication refills . Calls provider office for new concerns or questions  Please see past updates related to this goal by clicking on the "Past Updates" button in the selected goal      .  "to have less fatigue" (pt-stated)        CARE PLAN ENTRY (see longitudinal plan of care for additional care plan information)  Current Barriers:  [name] Knowledge Deficits related to evaluation and treatment of Vitamin D deficiency   . Chronic Disease Management support and education needs related to Asthma, chronic pain, personal history of stomach cancer, partial Seizure    Nurse Case Manager Clinical Goal(s):  [name] Over the next 6 months, patient will work with the CCM team and PCP to address needs related to evaluation and treatment of Vitamin D deficiency   CCM RN CM Interventions:  12/02/19 call completed with patient  . Inter-disciplinary care team collaboration (see longitudinal plan of care) . Evaluation of current treatment plan related to Vitamin D deficiency and patient's adherence to plan as established by provider. . Provided education to patient re: potential cause and effect of Vitamin D deficiency; Educated patient on treatment and dietary recommendations; Educated on target Vitamin D range for optimal health  . Reviewed medications with patient and discussed patient is currently not taking a Vitamin D supplement  . Collaborated with PCP provider [name], FNP  regarding patient's c/o symptoms suggestive of Vitamin D deficiency and patient's request to have this checked at next appointment  .  Discussed plans with patient for ongoing care management follow up and provided patient with direct contact information for care management team  Patient Self Care Activities:  . Self administers medications as prescribed . Attends all scheduled provider appointments . Calls pharmacy for medication refills . Calls provider office for new concerns or questions  Initial CM RN clinical goal     .  COMPLETED: "to have no Seizures" (pt-stated)        CARE PLAN ENTRY (see longtitudinal plan of care for additional care plan information)  Current Barriers:  [name] Knowledge Deficits related to disease process and Self Health management of Seizures . Chronic Disease Management support and education needs related to History of Seizures, Asthma, Chronic pain   Nurse Case Manager Clinical Goal(s):  [name] Over the next 90 days,  the patient will demonstrate ongoing self health care management ability as evidenced by patient will verbalize having no Seizure activity *  CCM RN CM Interventions:  12/02/19 spoke to patient  . Evaluation of current treatment plan related to Seizures  and patient's adherence to plan as established by provider . Reviewed medications with patient and discussed indication, dosage and frequency of his prescribed anti-seizure medication and patient reports understanding and adherence with no noted SE . Determined patient has not experienced any Seizure activity while taking Keppra as prescribed, he is being followed by Dr. Delice Lesch - Neurology . Discussed plans with patient for ongoing care management follow up and provided patient with direct contact information for care management team  Patient Self Care Activities:  . Self administers medications as prescribed . Attends all scheduled provider appointments . Calls pharmacy for medication refills . Calls provider office for new concerns or questions  Please see past updates related to this goal by clicking on the "Past Updates" button in the selected goal      .  COMPLETED: "to heal from stomach surgery" (pt-stated)        CARE PLAN ENTRY (see longtitudinal plan of care for additional care plan information)  Current Barriers:  [name] Knowledge Deficits related to pathology results for removal of lymph nodes and ongoing treatment management of Malignant Neoplasm of stomach . Chronic Disease Management support and education needs related to Malignant Neoplasm of stomach, Asthma, Chronic Pain  . Risk for Nutritional Imbalance   Nurse Case Manager Clinical Goal(s):  [name] Over the next 60 days, patient will verbalize understanding of plan for ongoing MD follow up and management of Malignant Neoplasm of stomach . Over the next 90 days, patient will verbalize complete healing from his partial gastrectomy without complications  . Over the next 90 days,  patient will demonstrate diet progression as directed by MD w/o complications such as bowel obstruction, dehydration, malnutrition and or extreme weight loss   CCM RN CM Interventions:  12/02/19 spoke to patient  . Assessed for s/s suggestive of depression and or anxiety following recent diagnosis and surgery for Malignant Neoplasm of the stomach . Determined patient is not experiencing depression or anxiety at this time despite his recent change with his health . Evaluation of current treatment plan related to s/p distal Gastrectomy and patient's adherence to plan as established by provider . Determined patient continues to have intermittent abdominal pain but has improved significantly.  He continues to see Dr. Channing Mutters - surgeon for his stomach tumor which was removed completely  . Discussed plans with patient for ongoing care management follow up and provided patient with direct contact information for care management  team  Patient Self Care Activities:  . Self administers medications as prescribed . Attends all scheduled provider appointments . Calls pharmacy for medication refills . Calls provider office for new concerns or questions  Please see past updates related to this goal by clicking on the "Past Updates" button in the selected goal         Plan:   Telephone follow up appointment with care management team member scheduled for: 04/26/19  Barb Merino, RN, BSN, CCM Care Management Coordinator [facility]  Direct Phone: [phone]

## 2019-12-28 DIAGNOSIS — Z8619 Personal history of other infectious and parasitic diseases: Secondary | ICD-10-CM | POA: Diagnosis not present

## 2019-12-28 DIAGNOSIS — R1010 Upper abdominal pain, unspecified: Secondary | ICD-10-CM | POA: Diagnosis not present

## 2019-12-28 DIAGNOSIS — C169 Malignant neoplasm of stomach, unspecified: Secondary | ICD-10-CM | POA: Diagnosis not present

## 2019-12-31 ENCOUNTER — Other Ambulatory Visit: Payer: Self-pay | Admitting: General Surgery

## 2019-12-31 DIAGNOSIS — R1010 Upper abdominal pain, unspecified: Secondary | ICD-10-CM

## 2020-01-20 ENCOUNTER — Ambulatory Visit
Admission: RE | Admit: 2020-01-20 | Discharge: 2020-01-20 | Disposition: A | Discharge status: Home / Self Care | Payer: Medicare HMO | Source: Ambulatory Visit | Attending: General Surgery | Admitting: General Surgery

## 2020-01-20 DIAGNOSIS — R1010 Upper abdominal pain, unspecified: Secondary | ICD-10-CM | POA: Diagnosis not present

## 2020-01-20 MED ORDER — IOPAMIDOL (ISOVUE-300) INJECTION 61%
100.0000 mL | Freq: Once | INTRAVENOUS | Status: AC | PRN
  Administered 2020-01-20: 100 mL via INTRAVENOUS

## 2020-02-12 ENCOUNTER — Other Ambulatory Visit: Payer: Self-pay | Admitting: Gastroenterology

## 2020-04-21 ENCOUNTER — Ambulatory Visit (INDEPENDENT_AMBULATORY_CARE_PROVIDER_SITE_OTHER): Payer: Medicare HMO

## 2020-04-21 ENCOUNTER — Encounter: Payer: Self-pay | Admitting: Nurse Practitioner

## 2020-04-21 ENCOUNTER — Other Ambulatory Visit: Payer: Self-pay

## 2020-04-21 ENCOUNTER — Ambulatory Visit (INDEPENDENT_AMBULATORY_CARE_PROVIDER_SITE_OTHER): Payer: Medicare HMO | Admitting: Nurse Practitioner

## 2020-04-21 VITALS — BP 108/62 | HR 71 | Temp 98.3°F | Ht 72.0 in | Wt 153.4 lb

## 2020-04-21 DIAGNOSIS — Z13228 Encounter for screening for other metabolic disorders: Secondary | ICD-10-CM

## 2020-04-21 DIAGNOSIS — R5383 Other fatigue: Secondary | ICD-10-CM

## 2020-04-21 DIAGNOSIS — R413 Other amnesia: Secondary | ICD-10-CM | POA: Diagnosis not present

## 2020-04-21 DIAGNOSIS — Z0000 Encounter for general adult medical examination without abnormal findings: Secondary | ICD-10-CM

## 2020-04-21 DIAGNOSIS — E559 Vitamin D deficiency, unspecified: Secondary | ICD-10-CM | POA: Diagnosis not present

## 2020-04-21 DIAGNOSIS — Z23 Encounter for immunization: Secondary | ICD-10-CM

## 2020-04-21 MED ORDER — SHINGRIX 50 MCG/0.5ML IM SUSR: 0.5000 mL | Freq: Once | INTRAMUSCULAR | 0 refills | Status: AC

## 2020-04-21 NOTE — Progress Notes (Signed)
I,Yamilka Roman [employer] as a Education administrator for [employer], FNP.,have documented all relevant documentation on the behalf of [name], FNP,as directed by  [name], FNP while in the presence of [name], [place]. This visit occurred during the SARS-CoV-2 public health emergency.  Safety protocols were in place, including screening questions prior to the visit, additional usage of staff PPE, and extensive cleaning of exam room while observing appropriate contact time as indicated for disinfecting solutions.  Subjective:     Patient ID: [name] , male    DOB: 21-May-1970 , 50 y.o.   MRN: [id number]   Chief Complaint  Patient presents with  . vitamin d recheck  . Hyperlipidemia    HPI  Patient presents today for a f/u on his vitamin d and chol.   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     Past Medical History:  Diagnosis Date  . Abdominal pain    intermittent  . Allergy    SEASONAL  . Anemia   . Aneurysm ([place])    brain  . Asthma   . Gastric cancer ([place])   . GERD (gastroesophageal reflux disease)   . Pancreatitis   . PTSD (post-traumatic stress disorder) 2008  . Rectal polyp 01/09/2006   Hyperplastic  . Seizures ([place])    ADMITS TAKING MEDICATION FOR SEIZURE D/T ANEURYSM ,DO NOT KNOW IF HAD SEIZURE. 11/10/18     Family History  Problem Relation Age of Onset  . Stroke Mother   . Hypertension Mother   . Diabetes Father   . Pancreatic cancer Maternal Uncle   . Colon cancer Maternal Uncle   . Cancer Other   . Diabetes Other   . Colon cancer Maternal Grandfather   . Colon cancer Paternal Grandfather   . Pancreatic cancer Cousin   . Esophageal cancer Neg Hx   . Rectal cancer Neg Hx   . Stomach cancer Neg Hx      Current Outpatient Medications:  .  Zoster Vaccine Adjuvanted [facility]) injection, Inject 0.5 mLs into the muscle once for 1 dose., Disp: 0.5 mL, Rfl: 0 .  bismuth subsalicylate (PEPTO-BISMOL) 262 MG  chewable tablet, Chew 2 tablets (524 mg total) by mouth 4 (four) times daily., Disp: 80 tablet, Rfl: 0 .  [employer]S STOMACH RELIEF 262 MG TABS, Take 2 tablets by mouth 4 (four) times daily., Disp: , Rfl:  .  Cyanocobalamin (B-12) 1000 MCG SUBL, Place 1,000 mcg under the tongue daily., Disp: , Rfl:  .  dicyclomine (BENTYL) 20 MG tablet, Take 1 tablet (20 mg total) by mouth 4 (four) times daily -  before meals and at bedtime. (Patient taking differently: Take 100 mg by mouth 2 (two) times daily.), Disp: 90 tablet, Rfl: 1 .  docusate sodium (COLACE) 100 MG capsule, TAKE 1 CAPSULE BY MOUTH TWICE A DAY (Patient taking differently: Take 100 mg by mouth 2 (two) times daily.), Disp: 60 capsule, Rfl: 2 .  famotidine (PEPCID) 40 MG tablet, TAKE 1 TABLET BY MOUTH TWICE A DAY, Disp: 180 tablet, Rfl: 1 .  fluticasone (FLONASE) 50 MCG/ACT nasal spray, Place 1 spray into both nostrils daily as needed for allergies or rhinitis., Disp: , Rfl:  .  folic acid (FOLVITE) 1 MG tablet, TAKE 1 TABLET BY MOUTH EVERY DAY, Disp: 90 tablet, Rfl: 0 .  levETIRAcetam (KEPPRA) 500 MG tablet, Take 1 tablet (500 mg total) by mouth 2 (two) times daily., Disp: 180 tablet, Rfl:  3 .  loratadine (CLARITIN) 10 MG tablet, Take 1 tablet (10 mg total) by mouth daily., Disp: 30 tablet, Rfl: 5 .  methocarbamol (ROBAXIN) 500 MG tablet, Take 1 tablet (500 mg total) by mouth every 8 (eight) hours as needed for muscle spasms., Disp: 30 tablet, Rfl: 1 .  metoCLOPramide (REGLAN) 10 MG tablet, TAKE 1 TO 2 TABLETS BY MOUTH EVERY DAY AT BEDTIME (Patient not taking: No sig reported), Disp: 60 tablet, Rfl: 0 .  Multiple Vitamin (MULTIVITAMIN WITH MINERALS) TABS tablet, Take 1 tablet by mouth daily., Disp:  , Rfl:  .  omeprazole (PRILOSEC) 20 MG capsule, Take 1 capsule (20 mg total) by mouth 2 (two) times daily before a meal., Disp: 20 capsule, Rfl: 0 .  ondansetron (ZOFRAN ODT) 4 MG disintegrating tablet, Take 1 tablet (4 mg total) by mouth every 8 (eight) hours  as needed., Disp: 10 tablet, Rfl: 0 .  oxyCODONE (OXY IR/ROXICODONE) 5 MG immediate release tablet, Take 1-2 tablets (5-10 mg total) by mouth every 6 (six) hours as needed., Disp: 30 tablet, Rfl: 0 .  pantoprazole (PROTONIX) 40 MG tablet, TAKE 1 TABLET BY MOUTH TWICE A DAY, Disp: 180 tablet, Rfl: 0 .  tetrahydrozoline-zinc (VISINE-AC) 0.05-0.25 % ophthalmic solution, Place 2 drops into both eyes 2 (two) times daily as needed (Itching). , Disp: , Rfl:  .  thiamine (VITAMIN B-1) 100 MG tablet, TAKE 1 TABLET BY MOUTH EVERY DAY (Patient taking differently: Take 100 mg by mouth daily.), Disp: 100 tablet, Rfl: 1   Allergies  Allergen Reactions  . Aspirin Anaphylaxis  . Ibuprofen Anaphylaxis  . Hydrocodone Hives and Itching  . Tylenol [Acetaminophen]     Upset stomach     Review of Systems  Constitutional: Negative.   HENT: Negative.   Eyes: Negative.   Respiratory: Negative.   Cardiovascular: Negative.   Gastrointestinal: Negative.   Endocrine: Negative.   Genitourinary: Negative.   Musculoskeletal: Negative.   Skin: Negative.   Neurological: Negative.   Hematological: Negative.   Psychiatric/Behavioral: Negative.      Today's Vitals   04/21/20 1428  BP: 108/62  Pulse: 71  Temp: 98.3 F (36.8 C)  TempSrc: Oral  Weight: 153 lb 7 oz (69.6 kg)  Height: 6' (1.829 m)  PainSc: 0-No pain   Body mass index is 20.81 kg/m.   Objective:  Physical Exam Constitutional:      General: He is not in acute distress.    Appearance: Normal appearance. He is normal weight.  Cardiovascular:     Rate and Rhythm: Normal rate and regular rhythm.     Pulses: Normal pulses.     Heart sounds: Normal heart sounds. No murmur heard.   Pulmonary:     Effort: Pulmonary effort is normal. No respiratory distress.     Breath sounds: Normal breath sounds. No wheezing.  Musculoskeletal:        General: Normal range of motion.     Cervical back: Normal range of motion and neck supple.  Skin:     General: Skin is warm and dry.     Capillary Refill: Capillary refill takes less than 2 seconds.  Neurological:     General: No focal deficit present.     Mental Status: He is alert and oriented to person, place, and time.     Cranial Nerves: No cranial nerve deficit.  Psychiatric:        Mood and Affect: Mood normal.        Behavior: Behavior normal.  Thought Content: Thought content normal.        Judgment: Judgment normal.         Assessment And Plan:     1. Fatigue, unspecified type  Will check vitamin B12   He has had gastric surgery so his Vitamin B12 may be low - Vitamin B12  2. Encounter for screening for metabolic disorder - VITAMIN D 25 Hydroxy (Vit-D Deficiency, Fractures) - Lipid panel  3. Encounter for immunization  Sent Rx to pharmacy to pharmacy I have advised him to check the cost - Zoster Vaccine Adjuvanted [facility]) injection; Inject 0.5 mLs into the muscle once for 1 dose.  Dispense: 0.5 mL; Refill: 0     Patient was given opportunity to ask questions. Patient verbalized understanding of the plan and was able to repeat key elements of the plan. All questions were answered to their satisfaction.  [name], FNP   I, [name], FNP, have reviewed all documentation for this visit. The documentation on 04/21/20 for the exam, diagnosis, procedures, and orders are all accurate and complete.   THE PATIENT IS ENCOURAGED TO PRACTICE SOCIAL DISTANCING DUE TO THE COVID-19 PANDEMIC.

## 2020-04-21 NOTE — Patient Instructions (Signed)
[name] , Thank you for taking time to come for your Medicare Wellness Visit. I appreciate your ongoing commitment to your health goals. Please review the following plan we discussed and let me know if I can assist you in the future.   Screening recommendations/referrals: Colonoscopy: completed 11/24/2018, due 11/23/2021 Recommended yearly ophthalmology/optometry visit for glaucoma screening and checkup Recommended yearly dental visit for hygiene and checkup  Vaccinations: Influenza vaccine: completed 10/29/2019, due 09/12/2020 Pneumococcal vaccine: n/a Tdap vaccine: completed 04/05/2018, due 04/05/2028 Shingles vaccine: discussed   Covid-19:  Completed per patient  Advanced directives: Advance directive discussed with you today. Even though you declined this today please call our office should you change your mind and we can give you the proper paperwork for you to fill out.  Conditions/risks identified: none  Next appointment: Follow up in one year for your annual wellness visit   Preventive Care 40-64 Years, Male Preventive care refers to lifestyle choices and visits with your health care provider that can promote health and wellness. What does preventive care include?  A yearly physical exam. This is also called an annual well check.  Dental exams once or twice a year.  Routine eye exams. Ask your health care provider how often you should have your eyes checked.  Personal lifestyle choices, including:  Daily care of your teeth and gums.  Regular physical activity.  Eating a healthy diet.  Avoiding tobacco and drug use.  Limiting alcohol use.  Practicing safe sex.  Taking low-dose aspirin every day starting at age 56. What happens during an annual well check? The services and screenings done by your health care provider during your annual well check will depend on your age, overall health, lifestyle risk factors, and family history of disease. Counseling  Your  health care provider may ask you questions about your:  Alcohol use.  Tobacco use.  Drug use.  Emotional well-being.  Home and relationship well-being.  Sexual activity.  Eating habits.  Work and work Statistician. Screening  You may have the following tests or measurements:  Height, weight, and BMI.  Blood pressure.  Lipid and cholesterol levels. These may be checked every 5 years, or more frequently if you are over 41 years old.  Skin check.  Lung cancer screening. You may have this screening every year starting at age 59 if you have a 30-pack-year history of smoking and currently smoke or have quit within the past 15 years.  Fecal occult blood test (FOBT) of the stool. You may have this test every year starting at age 83.  Flexible sigmoidoscopy or colonoscopy. You may have a sigmoidoscopy every 5 years or a colonoscopy every 10 years starting at age 58.  Prostate cancer screening. Recommendations will vary depending on your family history and other risks.  Hepatitis C blood test.  Hepatitis B blood test.  Sexually transmitted disease (STD) testing.  Diabetes screening. This is done by checking your blood sugar (glucose) after you have not eaten for a while (fasting). You may have this done every 1-3 years. Discuss your test results, treatment options, and if necessary, the need for more tests with your health care provider. Vaccines  Your health care provider may recommend certain vaccines, such as:  Influenza vaccine. This is recommended every year.  Tetanus, diphtheria, and acellular pertussis (Tdap, Td) vaccine. You may need a Td booster every 10 years.  Zoster vaccine. You may need this after age 79.  Pneumococcal 13-valent conjugate (PCV13) vaccine. You may need this  if you have certain conditions and have not been vaccinated.  Pneumococcal polysaccharide (PPSV23) vaccine. You may need one or two doses if you smoke cigarettes or if you have certain  conditions. Talk to your health care provider about which screenings and vaccines you need and how often you need them. This information is not intended to replace advice given to you by your health care provider. Make sure you discuss any questions you have with your health care provider. Document Released: 02/25/2015 Document Revised: 10/19/2015 Document Reviewed: 11/30/2014 Elsevier Interactive Patient Education  2017 [place] Prevention in the Home Falls can cause injuries. They can happen to people of all ages. There are many things you can do to make your home safe and to help prevent falls. What can I do on the outside of my home?  Regularly fix the edges of walkways and driveways and fix any cracks.  Remove anything that might make you trip as you walk through a door, such as a raised step or threshold.  Trim any bushes or trees on the path to your home.  Use bright outdoor lighting.  Clear any walking paths of anything that might make someone trip, such as rocks or tools.  Regularly check to see if handrails are loose or broken. Make sure that both sides of any steps have handrails.  Any raised decks and porches should have guardrails on the edges.  Have any leaves, snow, or ice cleared regularly.  Use sand or salt on walking paths during winter.  Clean up any spills in your garage right away. This includes oil or grease spills. What can I do in the bathroom?  Use night lights.  Install grab bars by the toilet and in the tub and shower. Do not use towel bars as grab bars.  Use non-skid mats or decals in the tub or shower.  If you need to sit down in the shower, use a plastic, non-slip stool.  Keep the floor dry. Clean up any water that spills on the floor as soon as it happens.  Remove soap buildup in the tub or shower regularly.  Attach bath mats securely with double-sided non-slip rug tape.  Do not have throw rugs and other things on the floor that  can make you trip. What can I do in the bedroom?  Use night lights.  Make sure that you have a light by your bed that is easy to reach.  Do not use any sheets or blankets that are too big for your bed. They should not hang down onto the floor.  Have a firm chair that has side arms. You can use this for support while you get dressed.  Do not have throw rugs and other things on the floor that can make you trip. What can I do in the kitchen?  Clean up any spills right away.  Avoid walking on wet floors.  Keep items that you use a lot in easy-to-reach places.  If you need to reach something above you, use a strong step stool that has a grab bar.  Keep electrical cords out of the way.  Do not use floor polish or wax that makes floors slippery. If you must use wax, use non-skid floor wax.  Do not have throw rugs and other things on the floor that can make you trip. What can I do with my stairs?  Do not leave any items on the stairs.  Make sure that there are handrails on  both sides of the stairs and use them. Fix handrails that are broken or loose. Make sure that handrails are as long as the stairways.  Check any carpeting to make sure that it is firmly attached to the stairs. Fix any carpet that is loose or worn.  Avoid having throw rugs at the top or bottom of the stairs. If you do have throw rugs, attach them to the floor with carpet tape.  Make sure that you have a light switch at the top of the stairs and the bottom of the stairs. If you do not have them, ask someone to add them for you. What else can I do to help prevent falls?  Wear shoes that:  Do not have high heels.  Have rubber bottoms.  Are comfortable and fit you well.  Are closed at the toe. Do not wear sandals.  If you use a stepladder:  Make sure that it is fully opened. Do not climb a closed stepladder.  Make sure that both sides of the stepladder are locked into place.  Ask someone to hold it for  you, if possible.  Clearly mark and make sure that you can see:  Any grab bars or handrails.  First and last steps.  Where the edge of each step is.  Use tools that help you move around (mobility aids) if they are needed. These include:  Canes.  Walkers.  Scooters.  Crutches.  Turn on the lights when you go into a dark area. Replace any light bulbs as soon as they burn out.  Set up your furniture so you have a clear path. Avoid moving your furniture around.  If any of your floors are uneven, fix them.  If there are any pets around you, be aware of where they are.  Review your medicines with your doctor. Some medicines can make you feel dizzy. This can increase your chance of falling. Ask your doctor what other things that you can do to help prevent falls. This information is not intended to replace advice given to you by your health care provider. Make sure you discuss any questions you have with your health care provider. Document Released: 11/25/2008 Document Revised: 07/07/2015 Document Reviewed: 03/05/2014 Elsevier Interactive Patient Education  2017 [employer].

## 2020-04-21 NOTE — Progress Notes (Signed)
This visit occurred during the SARS-CoV-2 public health emergency.  Safety protocols were in place, including screening questions prior to the visit, additional usage of staff PPE, and extensive cleaning of exam room while observing appropriate contact time as indicated for disinfecting solutions.  Subjective:   [name] is a 50 y.o. male who presents for Medicare Annual/Subsequent preventive examination.  Review of Systems     Cardiac Risk Factors include: male gender;sedentary lifestyle     Objective:    Today's Vitals   04/21/20 1414 04/21/20 1418  BP: 108/62   Pulse: 71   Temp: 98.3 F (36.8 C)   TempSrc: Oral   SpO2: 93%   Weight: 153 lb 6.4 oz (69.6 kg)   Height: 6' (1.829 m)   PainSc:  9    Body mass index is 20.8 kg/m.  Advanced Directives 04/21/2020 08/03/2019 04/16/2019 03/25/2019 03/19/2019 04/02/2018 09/25/2016  Does Patient Have a Medical Advance Directive? No No No No No No No  Would patient like information on creating a medical advance directive? No - Patient declined - No - Patient declined No - Patient declined Yes (MAU/Ambulatory/Procedural Areas - Information given) No - Patient declined -    Current Medications (verified) Outpatient Encounter Medications as of 04/21/2020  Medication Sig  . bismuth subsalicylate (PEPTO-BISMOL) 262 MG chewable tablet Chew 2 tablets (524 mg total) by mouth 4 (four) times daily.  . [employer] STOMACH RELIEF 262 MG TABS Take 2 tablets by mouth 4 (four) times daily.  . Cyanocobalamin (B-12) 1000 MCG SUBL Place 1,000 mcg under the tongue daily.  [name] dicyclomine (BENTYL) 20 MG tablet Take 1 tablet (20 mg total) by mouth 4 (four) times daily -  before meals and at bedtime. (Patient taking differently: Take 100 mg by mouth 2 (two) times daily.)  . docusate sodium (COLACE) 100 MG capsule TAKE 1 CAPSULE BY MOUTH TWICE A DAY (Patient taking differently: Take 100 mg by mouth 2 (two) times daily.)  . famotidine (PEPCID) 40 MG tablet TAKE 1  TABLET BY MOUTH TWICE A DAY  . fluticasone (FLONASE) 50 MCG/ACT nasal spray Place 1 spray into both nostrils daily as needed for allergies or rhinitis.  . folic acid (FOLVITE) 1 MG tablet TAKE 1 TABLET BY MOUTH EVERY DAY  . levETIRAcetam (KEPPRA) 500 MG tablet Take 1 tablet (500 mg total) by mouth 2 (two) times daily.  [name] loratadine (CLARITIN) 10 MG tablet Take 1 tablet (10 mg total) by mouth daily.  . methocarbamol (ROBAXIN) 500 MG tablet Take 1 tablet (500 mg total) by mouth every 8 (eight) hours as needed for muscle spasms.  . Multiple Vitamin (MULTIVITAMIN WITH MINERALS) TABS tablet Take 1 tablet by mouth daily.  [name] omeprazole (PRILOSEC) 20 MG capsule Take 1 capsule (20 mg total) by mouth 2 (two) times daily before a meal.  . ondansetron (ZOFRAN ODT) 4 MG disintegrating tablet Take 1 tablet (4 mg total) by mouth every 8 (eight) hours as needed.  [name] oxyCODONE (OXY IR/ROXICODONE) 5 MG immediate release tablet Take 1-2 tablets (5-10 mg total) by mouth every 6 (six) hours as needed.  . pantoprazole (PROTONIX) 40 MG tablet TAKE 1 TABLET BY MOUTH TWICE A DAY  . tetrahydrozoline-zinc (VISINE-AC) 0.05-0.25 % ophthalmic solution Place 2 drops into both eyes 2 (two) times daily as needed (Itching).   . thiamine (VITAMIN B-1) 100 MG tablet TAKE 1 TABLET BY MOUTH EVERY DAY (Patient taking differently: Take 100 mg by mouth daily.)  . metoCLOPramide (REGLAN) 10 MG tablet  TAKE 1 TO 2 TABLETS BY MOUTH EVERY DAY AT BEDTIME (Patient not taking: No sig reported)   No facility-administered encounter medications on file as of 04/21/2020.    Allergies (verified) Aspirin, Ibuprofen, Hydrocodone, and Tylenol [acetaminophen]   History: Past Medical History:  Diagnosis Date  . Abdominal pain    intermittent  . Allergy    SEASONAL  . Anemia   . 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 Asthma   . Gastric cancer ([place])   . GERD (gastroesophageal reflux disease)   . Pancreatitis   . PTSD (post-traumatic stress disorder) 2008   . Rectal polyp 01/09/2006   Hyperplastic  . Seizures ([place])    ADMITS TAKING MEDICATION FOR SEIZURE D/T ANEURYSM ,DO NOT KNOW IF HAD SEIZURE. 11/10/18   Past Surgical History:  Procedure Laterality Date  . arm surgery Left    repair of tendons "work injury cut"-"pinky finger numb"  . BURR HOLE Right 02/28/2015   Procedure: BURR HOLES FOR SUBDURAL HEMATOMA;  Surgeon: Newman Pies, MD;  Location: [facility];  Service: Neurosurgery;  Laterality: Right;  . CEREBRAL ANEURYSM REPAIR    . COLONOSCOPY  2016   colon polyps  . ESOPHAGOGASTRODUODENOSCOPY (EGD) WITH PROPOFOL N/A 01/13/2015   Procedure: ESOPHAGOGASTRODUODENOSCOPY (EGD) WITH PROPOFOL;  Surgeon: [name], MD;  Location: [facility];  Service: Endoscopy;  Laterality: N/A;  . ESOPHAGOGASTRODUODENOSCOPY (EGD) WITH PROPOFOL N/A 03/19/2019   Procedure: ESOPHAGOGASTRODUODENOSCOPY (EGD) WITH PROPOFOL;  Surgeon: [name], MD;  Location: [facility];  Service: Endoscopy;  Laterality: N/A;  . ESOPHAGOGASTRODUODENOSCOPY ENDOSCOPY    . EUS N/A 03/19/2019   Procedure: UPPER ENDOSCOPIC ULTRASOUND (EUS) RADIAL;  Surgeon: [name], MD;  Location: [facility];  Service: Endoscopy;  Laterality: N/A;  . HERNIA REPAIR     Umbilical  . LAPAROSCOPIC GASTRECTOMY  03/25/2019   LAPAROSCOPY DIAGNOSTIC (N/A )  . LAPAROSCOPY N/A 03/25/2019   Procedure: LAPAROSCOPY DIAGNOSTIC;  Surgeon: [name], MD;  Location: [facility];  Service: General;  Laterality: N/A;  . LYMPH NODE BIOPSY N/A 03/25/2019   Procedure: Gastric Lymph Node Biopsy;  Surgeon: [name], MD;  Location: [facility];  Service: General;  Laterality: N/A;  . PARTIAL GASTRECTOMY N/A 03/25/2019   Procedure: DISTAL SUBTOTAL GASTRECTOMY;  Surgeon: [name], MD;  Location: [facility];  Service: General;  Laterality: N/A;  . SUBMUCOSAL INJECTION  03/19/2019   Procedure: SUBMUCOSAL INJECTION;  Surgeon: [name], MD;  Location: [facility];  Service: Endoscopy;;  . 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 UPPER GASTROINTESTINAL ENDOSCOPY     Family History  Problem Relation Age of Onset  . Stroke Mother   . Hypertension Mother   . Diabetes Father   . Pancreatic cancer Maternal Uncle   . Colon cancer Maternal Uncle   . Cancer Other   . Diabetes Other   . Colon cancer Maternal Grandfather   . Colon cancer Paternal Grandfather   . Pancreatic cancer Cousin   . Esophageal cancer Neg Hx   . Rectal cancer Neg Hx   . Stomach cancer Neg Hx    Social History   Socioeconomic History  . Marital status: Single    Spouse name: Not on file  . Number of children: Not on file  . Years of education: Not on file  . Highest education level: Not on file  Occupational History  . Occupation: disable  Tobacco Use  . Smoking status: Former Smoker  Packs/day: 0.00    Types: Cigars    Quit date: 03/2019    Years since quitting: 1.1  . Smokeless tobacco: Never Used  . Tobacco comment: smokes a black and mild daily  Vaping Use  . Vaping Use: Never used  Substance and Sexual Activity  . Alcohol use: No  . Drug use: Yes    Types: Marijuana    Comment: last use 3 days ago per per 08-07-19  . Sexual activity: Yes  Other Topics Concern  . 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 Difficulty of Paying Living Expenses: Not hard at all  Food Insecurity: No Food Insecurity  . Worried About Charity fundraiser in the Last Year: Never true  . Ran Out of Food in the Last Year: Never true  Transportation Needs: No Transportation Needs  . Lack of Transportation (Medical): No  . Lack of Transportation (Non-Medical): No  Physical Activity: Inactive  . Days of Exercise per Week: 0 days  . Minutes of Exercise per  Session: 0 min  Stress: Stress Concern Present  . Feeling of Stress : To some extent  Social Connections: Not on file    Tobacco Counseling Counseling given: Not Answered Comment: smokes a black and mild daily   Clinical Intake:  Pre-visit preparation completed: Yes  Pain : 0-10 Pain Score: 9  Pain Type: Acute pain Pain Location: Abdomen Pain Descriptors / Indicators: Burning Pain Onset: More than a month ago Pain Frequency: Intermittent     Nutritional Status: BMI of 19-24  Normal Nutritional Risks: Nausea/ vomitting/ diarrhea (having nausea)  How often do you need to have someone help you when you read instructions, pamphlets, or other written materials from your doctor or pharmacy?: 1 - Never  Diabetic? no  Interpreter Needed?: No  Information entered by :: NAllen LPN   Activities of Daily Living In your present state of health, do you have any difficulty performing the following activities: 04/21/2020  Hearing? N  Vision? N  Difficulty concentrating or making decisions? N  Walking or climbing stairs? N  Dressing or bathing? N  Doing errands, shopping? N  Preparing Food and eating ? N  Using the Toilet? N  In the past six months, have you accidently leaked urine? N  Do you have problems with loss of bowel control? N  Managing your Medications? N  Managing your Finances? N  Housekeeping or managing your Housekeeping? N  Some recent data might be hidden    Patient Care Team: [name], FNP as PCP - General ([place]) Rex Kras [name], RN as Case Manager Delice Lesch Lezlie Octave, MD as Consulting Physician (Neurology)  Indicate any recent Medical Services you may have received from other than [facility] providers in the past year (date may be approximate).     Assessment:   This is a routine wellness examination for [name].  Hearing/Vision screen No exam data present  Dietary issues and exercise activities discussed: Current Exercise Habits: The patient  does not participate in regular exercise at present  Goals    .  "I may need help affording my medications in the near future" (pt-stated)      [place] (see longtitudinal  plan of care for additional care plan information)  Current Barriers:  [name] Knowledge Deficits related to pharmacy resources available to assist with drug cost . Chronic Disease Management support and education needs related to Malignant Neoplasm of stomach, History of Seizures, Asthma, Chronic pain   Nurse Case Manager Clinical Goal(s):  [name] Over the next 6 months, patient will demonstrate ongoing adherence to prescribed treatment plan for medication management as evidenced by patient will not run out of this medications or miss doses of his medications due to not being able to afford them  CCM RN CM Interventions:  12/02/19 call completed with patient  . Evaluation of current treatment plan related to Medication management including adherence and affordability  and patient's adherence to plan as established by provider. . Reviewed medications with patient and discussed patient may need resources to help with medication cost in the near future; patient is unable to verbalize the names of the medications he may need help with during this call  . Collaborated with embedded Pharm regarding patient's need for pharmacy resources to help with drug cost . Discussed plans with patient for ongoing care management follow up and provided patient with direct contact information for care management team . Pharmacy referral for assistance with resources that may help with drug cost due to patient reported expected hardship in the near future  Patient Self Care Activities:  . Self administers medications as prescribed . Attends all scheduled provider appointments . Calls pharmacy for medication refills . Calls provider office for new concerns or questions  Please see past updates related to this goal by clicking on the "Past  Updates" button in the selected goal      .  "to have less fatigue" (pt-stated)      CARE PLAN ENTRY (see longitudinal plan of care for additional care plan information)  Current Barriers:  [name] Knowledge Deficits related to evaluation and treatment of Vitamin D deficiency  . Chronic Disease Management support and education needs related to Asthma, chronic pain, personal history of stomach cancer, partial Seizure    Nurse Case Manager Clinical Goal(s):  [name] Over the next 6 months, patient will work with the CCM team and PCP to address needs related to evaluation and treatment of Vitamin D deficiency   CCM RN CM Interventions:  12/02/19 call completed with patient  . Inter-disciplinary care team collaboration (see longitudinal plan of care) . Evaluation of current treatment plan related to Vitamin D deficiency and patient's adherence to plan as established by provider. . Provided education to patient re: potential cause and effect of Vitamin D deficiency; Educated patient on treatment and dietary recommendations; Educated on target Vitamin D range for optimal health  . Reviewed medications with patient and discussed patient is currently not taking a Vitamin D supplement  . Collaborated with PCP provider [name], FNP  regarding patient's c/o symptoms suggestive of Vitamin D deficiency and patient's request to have this checked at next appointment  . Discussed plans with patient for ongoing care management follow up and provided patient with direct contact information for care management team  Patient Self Care Activities:  . Self administers medications as prescribed . Attends all scheduled provider appointments . Calls pharmacy for medication refills . Calls provider office for new concerns or questions  Initial CM RN clinical goal     .  Patient Stated (pt-stated)      Wants to get up to 300 pounds.    .  Patient Stated  04/16/2019, wants to wok out more    .  Patient Stated       04/22/2019, stay healthy      Depression Screen PHQ 2/9 Scores 04/21/2020 04/16/2019 04/16/2019 03/09/2019 07/03/2018 04/02/2018 02/14/2018  PHQ - 2 Score 0 0 0 0 0 2 0  PHQ- 9 Score - 0 - - - 7 -    Fall Risk Fall Risk  04/21/2020 08/03/2019 04/16/2019 03/09/2019 07/03/2018  Falls in the past year? 0 0 0 0 0  Number falls in past yr: - 0 0 - -  Injury with Fall? - 0 0 - -  Risk for fall due to : Medication side effect - Medication side effect - -  Follow up Falls evaluation completed;Education provided;Falls prevention discussed - Falls evaluation completed;Education provided;Falls prevention discussed - -    FALL RISK PREVENTION PERTAINING TO THE HOME:  Any stairs in or around the home? Yes  If so, are there any without handrails? Yes  Home free of loose throw rugs in walkways, pet beds, electrical cords, etc? Yes  Adequate lighting in your home to reduce risk of falls? Yes   ASSISTIVE DEVICES UTILIZED TO PREVENT FALLS:  Life alert? No  Use of a cane, walker or w/c? No  Grab bars in the bathroom? No  Shower chair or bench in shower? No  Elevated toilet seat or a handicapped toilet? No   TIMED UP AND GO:  Was the test performed? No .   Gait steady and fast without use of assistive device  Cognitive Function:     6CIT Screen 04/21/2020 04/16/2019 04/02/2018  What Year? 0 points 4 points 0 points  What month? 0 points 0 points 0 points  What time? 0 points 0 points 0 points  Count back from 20 2 points 2 points 0 points  Months in reverse 4 points 4 points 4 points  Repeat phrase 4 points 0 points 0 points  Total Score 10 10 4     Immunizations Immunization History  Administered Date(s) Administered  . Influenza Whole 01/12/2009, 11/18/2009  . Influenza,inj,Quad PF,6+ Mos 03/01/2015, 12/24/2017, 10/29/2019  . Pneumococcal Polysaccharide-23 03/02/2015    TDAP status: Due, Education has been provided regarding the importance of this vaccine. Advised may receive this vaccine at  local pharmacy or Health Dept. Aware to provide a copy of the vaccination record if obtained from local pharmacy or Health Dept. Verbalized acceptance and understanding.  Flu Vaccine status: Up to date  Pneumococcal vaccine status: Up to date  Covid-19 vaccine status: Completed vaccines  Qualifies for Shingles Vaccine? Yes   Zostavax completed No   Shingrix Completed?: No.    Education has been provided regarding the importance of this vaccine. Patient has been advised to call insurance company to determine out of pocket expense if they have not yet received this vaccine. Advised may also receive vaccine at local pharmacy or Health Dept. Verbalized acceptance and understanding.  Screening Tests Health Maintenance  Topic Date Due  . COVID-19 Vaccine (1) Never done  . TETANUS/TDAP  Never done  . COLONOSCOPY (Pts 45-43yrs Insurance coverage will need to be confirmed)  11/23/2021  . INFLUENZA VACCINE  Completed  . Hepatitis C Screening  Completed  . HIV Screening  Completed  . HPV VACCINES  Aged Out    Health Maintenance  Health Maintenance Due  Topic Date Due  . COVID-19 Vaccine (1) Never done  . TETANUS/TDAP  Never done    Colorectal cancer screening: Type of screening:  Colonoscopy. Completed 10. Repeat every 11/24/2018 years  Lung Cancer Screening: (Low Dose CT Chest recommended if Age 17-80 years, 30 pack-year currently smoking OR have quit w/in 15years.) does not qualify.   Lung Cancer Screening Referral: no  Additional Screening:  Hepatitis C Screening: does qualify; Completed 10/29/2019   Vision Screening: Recommended annual ophthalmology exams for early detection of glaucoma and other disorders of the eye. Is the patient up to date with their annual eye exam?  No  Who is the provider or what is the name of the office in which the patient attends annual eye exams? none If pt is not established with a provider, would they like to be referred to a provider to establish  care? No .   Dental Screening: Recommended annual dental exams for proper oral hygiene  Community Resource Referral / Chronic Care Management: CRR required this visit?  No   CCM required this visit?  No      Plan:     I have personally reviewed and noted the following in the patient's chart:   . Medical and social history . Use of alcohol, tobacco or illicit drugs  . Current medications and supplements . Functional ability and status . Nutritional status . Physical activity . Advanced directives . List of other physicians . Hospitalizations, surgeries, and ER visits in previous 12 months . Vitals . Screenings to include cognitive, depression, and falls . Referrals and appointments  In addition, I have reviewed and discussed with patient certain preventive protocols, quality metrics, and best practice recommendations. A written personalized care plan for preventive services as well as general preventive health recommendations were provided to patient.     Kellie Simmering, LPN   04/22/2160   Nurse Notes:

## 2020-04-22 LAB — LIPID PANEL
Chol/HDL Ratio: 2.4 ratio (ref 0.0–5.0)
Cholesterol, Total: 147 mg/dL (ref 100–199)
HDL: 61 mg/dL (ref >39)
LDL Chol Calc (NIH): 77 mg/dL (ref 0–99)
Triglycerides: 37 mg/dL (ref 0–149)
VLDL Cholesterol Cal: 9 mg/dL (ref 5–40)

## 2020-04-22 LAB — VITAMIN B12: Vitamin B-12: 587 pg/mL (ref 232–1245)

## 2020-04-22 LAB — VITAMIN D 25 HYDROXY (VIT D DEFICIENCY, FRACTURES): Vit D, 25-Hydroxy: 12.4 ng/mL — ABNORMAL LOW (ref 30.0–100.0)

## 2020-04-25 ENCOUNTER — Ambulatory Visit (INDEPENDENT_AMBULATORY_CARE_PROVIDER_SITE_OTHER): Payer: Medicare HMO

## 2020-04-25 ENCOUNTER — Telehealth: Payer: Medicare HMO

## 2020-04-25 DIAGNOSIS — Z0000 Encounter for general adult medical examination without abnormal findings: Secondary | ICD-10-CM

## 2020-04-25 DIAGNOSIS — G8929 Other chronic pain: Secondary | ICD-10-CM

## 2020-04-25 DIAGNOSIS — J45909 Unspecified asthma, uncomplicated: Secondary | ICD-10-CM

## 2020-04-25 DIAGNOSIS — E559 Vitamin D deficiency, unspecified: Secondary | ICD-10-CM

## 2020-04-25 DIAGNOSIS — R569 Unspecified convulsions: Secondary | ICD-10-CM

## 2020-04-26 ENCOUNTER — Other Ambulatory Visit: Payer: Self-pay | Admitting: Nurse Practitioner

## 2020-04-26 DIAGNOSIS — E559 Vitamin D deficiency, unspecified: Secondary | ICD-10-CM

## 2020-04-26 MED ORDER — VITAMIN D (ERGOCALCIFEROL) 1.25 MG (50000 UNIT) PO CAPS: 50000.0000 [IU] | ORAL_CAPSULE | ORAL | 1 refills | Status: AC

## 2020-04-26 NOTE — Chronic Care Management (AMB) (Signed)
Chronic Care Management   CCM RN Visit Note  04/25/2020 Name: [name] MRN: [id number] DOB: 1970/06/12  Subjective: [name] is a 50 y.o. year old male who is a primary care patient of [name], [place]. The care management team was consulted for assistance with disease management and care coordination needs.    Engaged with patient by telephone for follow up visit in response to provider referral for case management and/or care coordination services.   Consent to Services:  The patient was given information about Chronic Care Management services, agreed to services, and gave verbal consent prior to initiation of services.  Please see initial visit note for detailed documentation.   Patient agreed to services and verbal consent obtained.   Assessment: Review of patient past medical history, allergies, medications, health status, including review of consultants reports, laboratory and other test data, was performed as part of comprehensive evaluation and provision of chronic care management services.   SDOH (Social Determinants of Health) assessments and interventions performed:  Yes, no acute challenges noted   CCM Care Plan  Allergies  Allergen Reactions  . Aspirin Anaphylaxis  . Ibuprofen Anaphylaxis  . Hydrocodone Hives and Itching  . Tylenol [Acetaminophen]     Upset stomach    Outpatient Encounter Medications as of 04/25/2020  Medication Sig  . bismuth subsalicylate (PEPTO-BISMOL) 262 MG chewable tablet Chew 2 tablets (524 mg total) by mouth 4 (four) times daily.  . [employer] STOMACH RELIEF 262 MG TABS Take 2 tablets by mouth 4 (four) times daily.  . Cyanocobalamin (B-12) 1000 MCG SUBL Place 1,000 mcg under the tongue daily.  [name] dicyclomine (BENTYL) 20 MG tablet Take 1 tablet (20 mg total) by mouth 4 (four) times daily -  before meals and at bedtime. (Patient taking differently: Take 100 mg by mouth 2 (two) times daily.)  . docusate sodium (COLACE) 100 MG capsule  TAKE 1 CAPSULE BY MOUTH TWICE A DAY (Patient taking differently: Take 100 mg by mouth 2 (two) times daily.)  . famotidine (PEPCID) 40 MG tablet TAKE 1 TABLET BY MOUTH TWICE A DAY  . fluticasone (FLONASE) 50 MCG/ACT nasal spray Place 1 spray into both nostrils daily as needed for allergies or rhinitis.  . folic acid (FOLVITE) 1 MG tablet TAKE 1 TABLET BY MOUTH EVERY DAY  . levETIRAcetam (KEPPRA) 500 MG tablet Take 1 tablet (500 mg total) by mouth 2 (two) times daily.  [name] loratadine (CLARITIN) 10 MG tablet Take 1 tablet (10 mg total) by mouth daily.  . methocarbamol (ROBAXIN) 500 MG tablet Take 1 tablet (500 mg total) by mouth every 8 (eight) hours as needed for muscle spasms.  . metoCLOPramide (REGLAN) 10 MG tablet TAKE 1 TO 2 TABLETS BY MOUTH EVERY DAY AT BEDTIME (Patient not taking: No sig reported)  . Multiple Vitamin (MULTIVITAMIN WITH MINERALS) TABS tablet Take 1 tablet by mouth daily.  [name] omeprazole (PRILOSEC) 20 MG capsule Take 1 capsule (20 mg total) by mouth 2 (two) times daily before a meal.  . ondansetron (ZOFRAN ODT) 4 MG disintegrating tablet Take 1 tablet (4 mg total) by mouth every 8 (eight) hours as needed.  [name] oxyCODONE (OXY IR/ROXICODONE) 5 MG immediate release tablet Take 1-2 tablets (5-10 mg total) by mouth every 6 (six) hours as needed.  . pantoprazole (PROTONIX) 40 MG tablet TAKE 1 TABLET BY MOUTH TWICE A DAY  . tetrahydrozoline-zinc (VISINE-AC) 0.05-0.25 % ophthalmic solution Place 2 drops into both eyes 2 (two) times daily as needed (Itching).   [name]  thiamine (VITAMIN B-1) 100 MG tablet TAKE 1 TABLET BY MOUTH EVERY DAY (Patient taking differently: Take 100 mg by mouth daily.)   No facility-administered encounter medications on file as of 04/25/2020.    Patient Active Problem List   Diagnosis Date Noted  . Personal history of stomach cancer 04/19/2019  . Gastric cancer s/p distal gastrectomy 03/25/2019 03/25/2019  . Personal history of colonic polyps 01/29/2019  . History of  pancreatitis 11/26/2017  . Hyponatremia   . Partial seizure ([place]) 03/05/2015  . History of seizures 03/05/2015  . Abdominal pain, chronic, epigastric 07/02/2014  . ABDOMINAL PAIN, GENERALIZED 03/07/2010  . KNEE PAIN, RIGHT 11/18/2009  . HYPERGLYCEMIA 09/08/2009  . ANKLE SPRAIN, LEFT 02/05/2009  . BACK STRAIN, LUMBAR 02/05/2009  . POLYURIA 10/28/2008  . ABDOMINAL PAIN, EPIGASTRIC 10/28/2008  . INSOMNIA, CHRONIC 10/10/2007  . DEPRESSION, CHRONIC 10/10/2007  . Asthma 10/10/2007  . WOUND INFECTION 10/10/2007  . History of alcohol abuse 04/15/2007  . 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 PANCREATITIS, CHRONIC 04/15/2007  . History of amputation of finger, right 04/16/2003    Conditions to be addressed/monitored:Asthma, chronic pain, personal history of stomach cancer, partial Seizure, Vitamin D deficiency     Care Plan : Wellness (Adult)  Updates made by [name], RN since 04/26/2020 12:00 AM    Problem: Medication Adherence (Wellness)   Priority: Medium    Long-Range Goal: Medication Adherence Maintained   Start Date: 04/25/2020  Expected End Date: 10/26/2020  This Visit's Progress: On track  Priority: Medium  Note:   Current Barriers:   Ineffective Self Health Maintenance Clinical Goal(s):  [name] Collaboration with [name], FNP regarding development and update of comprehensive plan of care as evidenced by provider attestation and co-signature . Inter-disciplinary care team collaboration (see longitudinal plan of care)  patient will work with care management team to address care coordination and chronic disease management needs related to Disease Management  Educational Needs  Care Coordination  Medication Management and Education  Psychosocial Support   Interventions:   Evaluation of current treatment plan related to Asthma, chronic pain, personal history of stomach cancer, partial Seizures, Vitamin D deficiency and patient's adherence to plan as established by  provider  Collaboration with [name], [place] regarding development and update of comprehensive plan of care as evidenced by provider attestation       and co-signature  Inter-disciplinary care team collaboration (see longitudinal plan of care)  Completed a medication adherence assessment including barriers to medication adherence  Determined patient has some difficulty paying for his medications, but reports med adherence   Sent embedded Pharm D referral for assistance with drug cost   Discussed plans with patient for ongoing care management follow up and provided patient with direct contact information for care management team Self Care Activities:  . Collaborate with the embedded Pharm D for assistance with drug cost  . Take all medications exactly as prescribed . Call pharmacy for refills 7 days before taking last dose  . Notify the CCM team and or PCP if unable to afford medication refills  Patient Goals: - To be able to afford my medications  Follow Up Plan: Telephone follow up appointment with care management team member scheduled for: 08/08/20    Care Plan : Vitamin D deficiency  Updates made by [name], RN since 04/26/2020 12:00 AM    Problem: Vitamin D deficiency   Priority: High  Onset Date: 04/25/2020    Long-Range Goal: Vitamin D deficiency   Start  Date: 04/25/2020  This Visit's Progress: On track  Priority: High  Note:   Current Barriers:   Ineffective Self Health Maintenance Clinical Goal(s):  [name] Collaboration with [name], FNP regarding development and update of comprehensive plan of care as evidenced by provider attestation and co-signature . Inter-disciplinary care team collaboration (see longitudinal plan of care)  patient will work with care management team to address care coordination and chronic disease management needs related to Disease Management  Educational Needs  Care Coordination  Medication Management and  Education  Psychosocial Support   Interventions:   Evaluation of current treatment plan related to  Vitamin D deficiency , self-management and patient's adherence to plan as established by provider.  Collaboration with [name], FNP regarding development and update of comprehensive plan of care as evidenced by provider attestation       and co-signature  Inter-disciplinary care team collaboration (see longitudinal plan of care)  Discussed and reviewed current Vitamin D level noted to be 12.4; Educated patient on target range  Educated on dietary recommendations and importance of getting natural Vitamin D by spending time outdoors when possible   Reviewed and discussed on going monitoring of Vitamin D   Mailed printed educational materials related to Vitamin D deficiency for patient to review and discuss at next CCM RN f/u call   Discussed plans with patient for ongoing care management follow up and provided patient with direct contact information for care management team Self Care Activities:  . Take Vitamin D supplement as prescribed by PCP . Try to eat a Vitamin D rich diet  . Try to get at least 15 minutes of sunshine daily when possible  . Review and discuss patient educational materials related to Vitamin D deficiency  . Keep all PCP follow up appointments . Call PCP for questions or concerns  Patient Goals: - To improve and or resolve Vitamin D deficiency  Follow Up Plan: Telephone follow up appointment with care management team member scheduled for: 08/08/20    Plan:Telephone follow up appointment with care management team member scheduled for:  08/08/20  Barb Merino, RN, BSN, CCM Care Management Coordinator [place] [facility]  Direct Phone: [phone]

## 2020-04-26 NOTE — Patient Instructions (Signed)
Goals Addressed      Other   .  Improve and or Resolve Vitamin D deficiency   On track     Timeframe:  Long-Range Goal Priority:  High Start Date: 04/25/20                            Expected End Date: 10/26/20    Next Follow Up date:  08/08/20   Patient Goals/Self- Care Activities:      . Take Vitamin D supplement as prescribed by PCP . Try to eat a Vitamin D rich diet  . Try to get at least 15 minutes of sunshine daily when possible  . Review and discuss patient educational materials related to Vitamin D deficiency  . Keep all PCP follow up appointments . Call PCP for questions or concerns                 .  Medication Adherence Maintained   On track     Timeframe:  Long-Range Goal Priority:  Medium Start Date: 04/20/20                            Expected End Date: 10/21/20  Next Scheduled Follow Up date: 08/08/20  Patient Goals/Self-Care Activities:         . Collaborate with the embedded Pharm D for assistance with drug cost  . Take all medications exactly as prescribed . Call pharmacy for refills 7 days before taking last dose  . Notify the CCM team and or PCP if unable to afford medication refills

## 2020-05-05 ENCOUNTER — Telehealth: Payer: Self-pay | Admitting: *Deleted

## 2020-05-05 NOTE — Chronic Care Management (AMB) (Signed)
  Chronic Care Management   Note  05/05/2020 Name: Trevin Gartrell MRN: [id number] DOB: February 29, 1970  Jiro Manjinder Breau is a 50 y.o. year old male who is a primary care patient of [name], [place]. I reached out to [employer] by phone today in response to a referral sent by Mr. Jaran Dellis Filbert Chandley's PCP, [name], FNP.     Mr. Economou was given information about Chronic Care Management services today including:  1. CCM service includes personalized support from designated clinical staff supervised by his physician, including individualized plan of care and coordination with other care providers 2. 24/7 contact phone numbers for assistance for urgent and routine care needs. 3. Service will only be billed when office clinical staff spend 20 minutes or more in a month to coordinate care. 4. Only one practitioner may furnish and bill the service in a calendar month. 5. The patient may stop CCM services at any time (effective at the end of the month) by phone call to the office staff. 6. The patient will be responsible for cost sharing (co-pay) of up to 20% of the service fee (after annual deductible is met).  Patient agreed to services and verbal consent obtained.   Follow up plan: Telephone appointment with care management team member scheduled for:05/18/2020  [place] [facility]

## 2020-05-17 ENCOUNTER — Telehealth: Payer: Self-pay

## 2020-05-17 NOTE — Chronic Care Management (AMB) (Signed)
Chronic Care Management Pharmacy Assistant   Name: [name]  MRN: [id number] DOB: 04-02-1970   Reason for Encounter: Medication Review/Initial Questions for Pharmacist visit on 05/18/20.    Recent office visits:  04/21/20-Moore, Doreene Burke, [place] (OV).   Recent consult visits:  12/28/19-Byerly, Dorris Fetch (General Surgery).  Hospital visits:  None in previous 6 months   Medication changes indicated: 04/21/20- Zoster Vaccine Adjuvanted [facility]) injection.   Medications: Outpatient Encounter Medications as of 05/17/2020  Medication Sig  . bismuth subsalicylate (PEPTO-BISMOL) 262 MG chewable tablet Chew 2 tablets (524 mg total) by mouth 4 (four) times daily.  . [facility] STOMACH RELIEF 262 MG TABS Take 2 tablets by mouth 4 (four) times daily.  . Cyanocobalamin (B-12) 1000 MCG SUBL Place 1,000 mcg under the tongue daily.  [name] dicyclomine (BENTYL) 20 MG tablet Take 1 tablet (20 mg total) by mouth 4 (four) times daily -  before meals and at bedtime. (Patient taking differently: Take 100 mg by mouth 2 (two) times daily.)  . docusate sodium (COLACE) 100 MG capsule TAKE 1 CAPSULE BY MOUTH TWICE A DAY (Patient taking differently: Take 100 mg by mouth 2 (two) times daily.)  . famotidine (PEPCID) 40 MG tablet TAKE 1 TABLET BY MOUTH TWICE A DAY  . fluticasone (FLONASE) 50 MCG/ACT nasal spray Place 1 spray into both nostrils daily as needed for allergies or rhinitis.  . folic acid (FOLVITE) 1 MG tablet TAKE 1 TABLET BY MOUTH EVERY DAY  . levETIRAcetam (KEPPRA) 500 MG tablet Take 1 tablet (500 mg total) by mouth 2 (two) times daily.  [name] loratadine (CLARITIN) 10 MG tablet Take 1 tablet (10 mg total) by mouth daily.  . methocarbamol (ROBAXIN) 500 MG tablet Take 1 tablet (500 mg total) by mouth every 8 (eight) hours as needed for muscle spasms.  . metoCLOPramide (REGLAN) 10 MG tablet TAKE 1 TO 2 TABLETS BY MOUTH EVERY DAY AT BEDTIME (Patient not taking: No sig reported)  . Multiple Vitamin (MULTIVITAMIN  WITH MINERALS) TABS tablet Take 1 tablet by mouth daily.  [name] omeprazole (PRILOSEC) 20 MG capsule Take 1 capsule (20 mg total) by mouth 2 (two) times daily before a meal.  . ondansetron (ZOFRAN ODT) 4 MG disintegrating tablet Take 1 tablet (4 mg total) by mouth every 8 (eight) hours as needed.  [name] oxyCODONE (OXY IR/ROXICODONE) 5 MG immediate release tablet Take 1-2 tablets (5-10 mg total) by mouth every 6 (six) hours as needed.  . pantoprazole (PROTONIX) 40 MG tablet TAKE 1 TABLET BY MOUTH TWICE A DAY  . tetrahydrozoline-zinc (VISINE-AC) 0.05-0.25 % ophthalmic solution Place 2 drops into both eyes 2 (two) times daily as needed (Itching).   . thiamine (VITAMIN B-1) 100 MG tablet TAKE 1 TABLET BY MOUTH EVERY DAY (Patient taking differently: Take 100 mg by mouth daily.)  . Vitamin D, Ergocalciferol, (DRISDOL) 1.25 MG (50000 UNIT) CAPS capsule Take 1 capsule (50,000 Units total) by mouth 2 (two) times a week.   No facility-administered encounter medications on file as of 05/17/2020.      Note: Attempted to outreach the patient today to review any changes to medications or health for initial visit with Orlando Penner, CPP tomorrow 05/18/20 at 2:00 PM. No answer, left a voicemail with a call back number. Notified the patient in the message of appointment date and time with Orlando Penner, CPP and to have medications and supplements near for phone visit.  Star Rating Drugs: None.  Jeni Salles, CPP Notified.  Raynelle Highland, CMA Clinical  Pharmacist Assistant [phone] CCM Total Time: 14 minutes

## 2020-05-18 ENCOUNTER — Ambulatory Visit (INDEPENDENT_AMBULATORY_CARE_PROVIDER_SITE_OTHER): Payer: Medicare HMO

## 2020-05-18 DIAGNOSIS — G8929 Other chronic pain: Secondary | ICD-10-CM

## 2020-05-18 DIAGNOSIS — J45909 Unspecified asthma, uncomplicated: Secondary | ICD-10-CM

## 2020-05-18 DIAGNOSIS — R569 Unspecified convulsions: Secondary | ICD-10-CM

## 2020-05-18 DIAGNOSIS — R1013 Epigastric pain: Secondary | ICD-10-CM

## 2020-05-18 DIAGNOSIS — Z0000 Encounter for general adult medical examination without abnormal findings: Secondary | ICD-10-CM

## 2020-05-18 DIAGNOSIS — E559 Vitamin D deficiency, unspecified: Secondary | ICD-10-CM

## 2020-05-18 NOTE — Progress Notes (Addendum)
Chronic Care Management Pharmacy Note  06/03/2020 Name:  [name] MRN:  [id number] DOB:  1970-12-03  Subjective: [name] is an 50 y.o. year old male who is a primary patient of [name], [place].  The CCM team was consulted for assistance with disease management and care coordination needs.  Patient reports that the cost of his medications are very high. Patient reports the medication was one thousand dollars and the cost went down to 95 dollars with insurance.  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 He is able to eat but sometimes the tenderness of his stomach is painful he reports that is not often.   Engaged with patient by telephone for initial visit in response to provider referral for pharmacy case management and/or care coordination services.   Consent to Services:  The patient was given the following information about Chronic Care Management services today, agreed to services, and gave verbal consent: 1. CCM service includes personalized support from designated clinical staff supervised by the primary care provider, including individualized plan of care and coordination with other care providers 2. 24/7 contact phone numbers for assistance for urgent and routine care needs. 3. Service will only be billed when office clinical staff spend 20 minutes or more in a month to coordinate care. 4. Only one practitioner may furnish and bill the service in a calendar month. 5.The patient may stop CCM services at any time (effective at the end of the month) by phone call to the office staff. 6. The patient will be responsible for cost sharing (co-pay) of up to 20% of the service fee (after annual deductible is met). Patient agreed to services and consent obtained.  Patient Care Team: [name], [place] as PCP - General ([place]) Rex Kras, [name], RN as Case Manager Delice Lesch Lezlie Octave, MD as  Consulting Physician (Neurology) Mayford Knife, [facility] (Pharmacist)  Recent office visits: 04/21/2020 PCP OV   Recent consult visits: 12/28/2019 [facility] visits: None in previous 6 months  Objective:  Lab Results  Component Value Date   CREATININE 0.96 07/26/2019   BUN 9 07/26/2019   GFRNONAA >60 07/26/2019   GFRAA >60 07/26/2019   NA 138 07/26/2019   K 4.3 07/26/2019   CALCIUM 9.1 07/26/2019   CO2 25 07/26/2019   GLUCOSE 82 07/26/2019    Lab Results  Component Value Date/Time   HGBA1C 5.4 11/26/2017 12:18 PM   HGBA1C 5.7 (H) 03/06/2015 12:08 AM    Last diabetic Eye exam: No results found for: HMDIABEYEEXA  Last diabetic Foot exam: No results found for: HMDIABFOOTEX   Lab Results  Component Value Date   CHOL 147 04/21/2020   HDL 61 04/21/2020   LDLCALC 77 04/21/2020   TRIG 37 04/21/2020   CHOLHDL 2.4 04/21/2020    Hepatic Function Latest Ref Rng & Units 07/26/2019 07/21/2019 03/30/2019  Total Protein 6.5 - 8.1 g/dL 6.3(L) 6.3(L) 6.1(L)  Albumin 3.5 - 5.0 g/dL 3.5 3.5 2.8(L)  AST 15 - 41 U/L 21 26 30   ALT 0 - 44 U/L 19 23 35  Alk Phosphatase 38 - 126 U/L 45 47 46  Total Bilirubin 0.3 - 1.2 mg/dL 0.7 0.7 0.6  Bilirubin, Direct 0.0 - 0.3 mg/dL - - -    Lab Results  Component Value Date/Time   TSH 0.700 04/28/2019 09:58 AM   TSH 0.68 03/11/2007 02:37 PM  CBC Latest Ref Rng & Units 07/26/2019 07/21/2019 03/30/2019  WBC 4.0 - 10.5 K/uL 3.0(L) 4.1 3.9(L)  Hemoglobin 13.0 - 17.0 g/dL 12.4(L) 12.0(L) 12.1(L)  Hematocrit 39.0 - 52.0 % 39.1 36.7(L) 36.3(L)  Platelets 150 - 400 K/uL 169 177 169    Lab Results  Component Value Date/Time   VD25OH 12.4 (L) 04/21/2020 03:05 PM    Clinical ASCVD: Yes  The 10-year ASCVD risk score Mikey Bussing DC Jr., et al., 2013) is: 3.6%   Values used to calculate the score:     Age: 31 years     Sex: Male     Is Non-Hispanic African American: Yes     Diabetic: No     Tobacco smoker: No     Systolic Blood Pressure:  108 mmHg     Is BP treated: No     HDL Cholesterol: 61 mg/dL     Total Cholesterol: 147 mg/dL    Depression screen [facility] 2/9 04/21/2020 04/16/2019 04/16/2019  Decreased Interest 0 0 0  Down, Depressed, Hopeless 0 0 0  PHQ - 2 Score 0 0 0  Altered sleeping - 0 -  Tired, decreased energy - 0 -  Change in appetite - 0 -  Feeling bad or failure about yourself  - 0 -  Trouble concentrating - 0 -  Moving slowly or fidgety/restless - 0 -  Suicidal thoughts - 0 -  PHQ-9 Score - 0 -  Difficult doing work/chores - Not difficult at all -     Social History   Tobacco Use  Smoking Status Former Smoker  . Packs/day: 0.00  . Types: Cigars  . Quit date: 03/2019  . Years since quitting: 1.2  Smokeless Tobacco Never Used  Tobacco Comment   smokes a black and mild daily   BP Readings from Last 3 Encounters:  04/21/20 108/62  04/21/20 108/62  10/29/19 116/72   Pulse Readings from Last 3 Encounters:  04/21/20 71  04/21/20 71  10/29/19 68   Wt Readings from Last 3 Encounters:  04/21/20 153 lb 7 oz (69.6 kg)  04/21/20 153 lb 6.4 oz (69.6 kg)  10/29/19 151 lb 6.4 oz (68.7 kg)   BMI Readings from Last 3 Encounters:  04/21/20 20.81 kg/m  04/21/20 20.80 kg/m  10/29/19 19.97 kg/m    Assessment/Interventions: Review of patient past medical history, allergies, medications, health status, including review of consultants reports, laboratory and other test data, was performed as part of comprehensive evaluation and provision of chronic care management services.   SDOH:  (Social Determinants of Health) assessments and interventions performed: Yes SDOH Interventions   Flowsheet Row Most Recent Value  SDOH Interventions   Food Insecurity Interventions Intervention Not Indicated     SDOH Screenings   Alcohol Screen: Not on file  Depression (PHQ2-9): Low Risk   . PHQ-2 Score: 0  Financial Resource Strain: Low Risk   . Difficulty of Paying Living Expenses: Not hard at all  Food Insecurity:  No Food Insecurity  . Worried About Charity fundraiser in the Last Year: Never true  . Ran Out of Food in the Last Year: Never true  Housing: Not on file  Physical Activity: Inactive  . Days of Exercise per Week: 0 days  . Minutes of Exercise per Session: 0 min  Social Connections: Not on file  Stress: Stress Concern Present  . Feeling of Stress : To some extent  Tobacco Use: Medium Risk  . Smoking Tobacco Use: Former Smoker  .  Smokeless Tobacco Use: Never Used  Transportation Needs: No Transportation Needs  . Lack of Transportation (Medical): No  . Lack of Transportation (Non-Medical): No    CCM Care Plan  Allergies  Allergen Reactions  . Aspirin Anaphylaxis  . Ibuprofen Anaphylaxis  . Hydrocodone Hives and Itching  . Tylenol [Acetaminophen]     Upset stomach    Medications Reviewed Today    Reviewed by Mayford Knife, RPH (Pharmacist) on 05/18/20 at 1452  Med List Status: <None>  Medication Order Taking? Sig Documenting Provider Last Dose Status Informant  bismuth subsalicylate (PEPTO-BISMOL) 262 MG chewable tablet [id number]  Chew 2 tablets (524 mg total) by mouth 4 (four) times daily. [name], MD  Active Self  [facility] STOMACH RELIEF 262 MG TABS [id number]  Take 2 tablets by mouth 4 (four) times daily. [provider]  Active   Cyanocobalamin (B-12) 1000 MCG SUBL [id number] Yes Place 1,000 mcg under the tongue daily. [provider] Taking Active Self  dicyclomine (BENTYL) 20 MG tablet [id number]  Take 1 tablet (20 mg total) by mouth 4 (four) times daily -  before meals and at bedtime.  Patient taking differently: Take 100 mg by mouth 2 (two) times daily.   Rodriguez-Southworth, Sunday Spillers, PA-C  Active Self  docusate sodium (COLACE) 100 MG capsule [id number] Yes TAKE 1 CAPSULE BY MOUTH TWICE A DAY  Patient taking differently: Take 100 mg by mouth 2 (two) times daily.   Rodriguez-Southworth, Sunday Spillers, PA-C Taking Active   famotidine (PEPCID) 40 MG tablet  [id number] Yes TAKE 1 TABLET BY MOUTH TWICE A DAY [name], MD Taking Active   fluticasone [facility]) 50 MCG/ACT nasal spray [id number] Yes Place 1 spray into both nostrils daily as needed for allergies or rhinitis. [provider] Taking Active Self  folic acid (FOLVITE) 1 MG tablet [id number] Yes TAKE 1 TABLET BY MOUTH EVERY DAY [name], FNP Taking Active   levETIRAcetam (KEPPRA) 500 MG tablet [id number] Yes Take 1 tablet (500 mg total) by mouth 2 (two) times daily. Cameron Sprang, MD Taking Active   loratadine (CLARITIN) 10 MG tablet [id number] Yes Take 1 tablet (10 mg total) by mouth daily. Rodriguez-Southworth, Sunday Spillers, PA-C Taking Active Self  methocarbamol (ROBAXIN) 500 MG tablet [id number]  Take 1 tablet (500 mg total) by mouth every 8 (eight) hours as needed for muscle spasms. [name], MD  Active   metoCLOPramide (REGLAN) 10 MG tablet [id number]  TAKE 1 TO 2 TABLETS BY MOUTH EVERY DAY AT BEDTIME  Patient not taking: No sig reported   Rodriguez-Southworth, Sunday Spillers, PA-C  Active Self  Multiple Vitamin (MULTIVITAMIN WITH MINERALS) TABS tablet [id number] Yes Take 1 tablet by mouth daily. Maczis, Barth Kirks, PA-C Taking Active   omeprazole (PRILOSEC) 20 MG capsule [id number] Yes Take 1 capsule (20 mg total) by mouth 2 (two) times daily before a meal. [name], MD Taking Active Self  ondansetron (ZOFRAN ODT) 4 MG disintegrating tablet [id number] Yes Take 1 tablet (4 mg total) by mouth every 8 (eight) hours as needed. Isla Pence, MD Taking Active   oxyCODONE (OXY IR/ROXICODONE) 5 MG immediate release tablet [id number] Yes Take 1-2 tablets (5-10 mg total) by mouth every 6 (six) hours as needed. [name], MD Taking Active   pantoprazole (PROTONIX) 40 MG tablet [id number] Yes TAKE 1 TABLET BY MOUTH TWICE A DAY [name], MD Taking Active   tetrahydrozoline-zinc (VISINE-AC) 0.05-0.25 % ophthalmic solution [id number] Yes Place 2 drops into both eyes 2 (two) times  daily as needed (Itching).  [provider] Taking Active Self  thiamine (VITAMIN B-1) 100 MG tablet [id number] Yes TAKE 1 TABLET BY MOUTH EVERY DAY  Patient taking differently: Take 100 mg by mouth daily.   Rodriguez-Southworth, Sunday Spillers, PA-C Taking Active   Vitamin D, Ergocalciferol, (DRISDOL) 1.25 MG (50000 UNIT) CAPS capsule [id number] Yes Take 1 capsule (50,000 Units total) by mouth 2 (two) times a week. [name], FNP Taking Active           Patient Active Problem List   Diagnosis Date Noted  . Personal history of stomach cancer 04/19/2019  . Gastric cancer s/p distal gastrectomy 03/25/2019 03/25/2019  . Personal history of colonic polyps 01/29/2019  . History of pancreatitis 11/26/2017  . Hyponatremia   . Partial seizure ([place]) 03/05/2015  . History of seizures 03/05/2015  . Abdominal pain, chronic, epigastric 07/02/2014  . ABDOMINAL PAIN, GENERALIZED 03/07/2010  . KNEE PAIN, RIGHT 11/18/2009  . HYPERGLYCEMIA 09/08/2009  . ANKLE SPRAIN, LEFT 02/05/2009  . BACK STRAIN, LUMBAR 02/05/2009  . POLYURIA 10/28/2008  . ABDOMINAL PAIN, EPIGASTRIC 10/28/2008  . INSOMNIA, CHRONIC 10/10/2007  . DEPRESSION, CHRONIC 10/10/2007  . Asthma 10/10/2007  . WOUND INFECTION 10/10/2007  . History of alcohol abuse 04/15/2007  . 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 PANCREATITIS, CHRONIC 04/15/2007  . History of amputation of finger, right 04/16/2003    Immunization History  Administered Date(s) Administered  . Influenza Whole 01/12/2009, 11/18/2009  . Influenza,inj,Quad PF,6+ Mos 03/01/2015, 12/24/2017, 10/29/2019  . PFIZER Comirnaty([place])Covid-19 Tri-Sucrose Vaccine 05/26/2020  . Pneumococcal Polysaccharide-23 03/02/2015  . Tdap 04/05/2018    Conditions to be addressed/monitored:  Tobacco use and Vitamin D Deficiency     Current Barriers:  . Unable to independently monitor therapeutic efficacy   Pharmacist Clinical Goal(s):  [name] Patient will achieve adherence to monitoring  guidelines and medication adherence to achieve therapeutic efficacy through collaboration with PharmD and provider.    Interventions: . 1:1 collaboration with [name], FNP regarding development and update of comprehensive plan of care as evidenced by provider attestation and co-signature . Inter-disciplinary care team collaboration (see longitudinal plan of care) . Comprehensive medication review performed; medication list updated in electronic medical record   Tobacco use (Goal:Smoking Cessation) -Uncontrolled -Previous quit attempts: has tried to quit smoking previously would not give a total number  -Current treatment  . Nothing at this time  -Patient smokes After 30 minutes of waking -Patient triggers include: anxiety -On a scale of 1-10, reports MOTIVATION to quit is 10 -On a scale of 1-10, reports CONFIDENCE in quitting is 10  -Patient set quit date of 05/27/2020. Provided contact information for [place] Quit Line (1-800-QUIT-NOW) and encouraged patient to reach out to this group for support. -Counseled on ways to quit smoking.  -Will have assistant call patient on 05/27/2020  -Will recommend that patient be placed on nicotine patches.  -Goal 21 mg nicotine patch once per day. Patient reports that at this time he would not be interested in starting two different smoking cessation techniques.  -Collaborate with PCP team to start patient on smoking cessation.  -Assistant to follow up with patient in one week to determine how patient is doing with smoking cessation.   Vitamin D Deficiency  (Goal:Vitamin D between 30.0 - 100.0 ng/mL) -Uncontrolled -Current treatment  . Vitamin D - taking 1 capsule two times per week.  o Patient reported taking two capsules once per week.  - Explained to patient the importance of taking 1 capsule twice per  week on different days.   -Recommended to continue current medication  -Explained the importance of taking medication at the same time twice per  week.   We discussed:  Recommend 1200 mg of calcium daily from dietary and supplemental sources. Recommend weight-bearing and muscle strengthening exercises for building and maintaining bone density.  Plan Continue current medications  Past History of Stomach Cancer   (Goal: alleviate signs and symptoms) -Controlled -Current treatment  . Dicyclomine 20 mg tablet four times daily before meals and at bedtime  . Famotidine 40 mg tablet twice a day o Was prescribed by Dr. Laverta Baltimore on 08/2019  o Breakthrough reflux at bedtime  . Pantoprazole 40 mg tablet twice a day o 180 tablets last year in September  -Patient is not compliant, [facility] and determined that patient has not been filling any of his medications. Contacted his gastroenterologist Dr. Edison Nasuti at [phone] for more information about patients medication regimen and which PPI he should be taking and if he should still be taking Famotidine. Spoke with Juliann Pulse at office who will provide to the nurse, Precious Bard and she will call me back with information on patient information . -Patient reports that he takes his medication everyday but [facility] that [facility]  -Will collaborate with Dr. Laverta Baltimore and PCP team to determine what medications should be on. -Spoke with Precious Bard RN who is the nurse of Dr. Laverta Baltimore:  -Has not been seen since June 2021.   -Patient had a no show in April, but he kept a May appointment last year.   -Scheduled for a recall Endoscopy in June.                  -Will get a letter when it is time that it will be scheduled at that time.   -May of last year treated in 16, 17, and 2021 for H. Pylori   -After Biopsy did not have h. Pylori  -Dr. [name] does not mention patient stopping the pantoprazole   -Patient has not requested any refills of these medications per Precious Bard   -Recommended to continue current medication    Health Maintenance -Vaccine gaps:  -Vaccinated against COVID-19 at [place]    -patient reports received first dose in February and second dose on May 3rd       -[facility]                    -Eligible for booster, spoke with patient and advised that the patient needs to have these completed.   -[facility] - patient got Booster  -Reviewed patients NCIR information and did not see where the COVID -19 vaccine was documented in registry. Spoke with patient who reports having card.  -Patients information will be updated in Epic once patient brings in [place] -19 vaccination card  -Educated on Cost vs benefit of each product must be carefully weighed by individual consumer -Patient is satisfied with current therapy and denies issues -Recommended to continue current medication   Patient Goals/Self-Care Activities . Patient will:  - take medications as prescribed focus on medication adherence by taking medication at the same time each day.   Follow Up Plan: Telephone follow up appointment with care management team member scheduled for: 08/2020 The patient has been provided with contact information for the care management team and has been advised to call with any  health related questions or concerns.   Medication Assistance: Patient reports that there was a high cost medication, but at this time he does not remember what it was, he will contact me when he remembers.   [id number] [place] [place]  [id number]: [phone]: [phone] Uses pill box? Yes Pt endorses 90% compliance  We discussed: Current pharmacy is preferred with insurance plan and patient is satisfied with pharmacy services Patient decided to: Continue current medication management strategy  Care Plan and Follow Up Patient Decision:  Patient agrees to Care Plan  and Follow-up.  Plan: The patient has been provided with contact information for the care management team and has been advised to call with any health related questions or concerns.  and The care management team will reach out to the patient again over the next 14 days.  VOrlando Penner PharmD Clinical Pharmacist [facility] [phone]

## 2020-05-25 ENCOUNTER — Telehealth: Payer: Self-pay | Admitting: Gastroenterology

## 2020-05-25 NOTE — Telephone Encounter (Signed)
[name] with [facility] would like to know what medication pt needs to be on. Pls call her at [phone], ask to be transferred to [facility].

## 2020-05-25 NOTE — Telephone Encounter (Signed)
Returned call and was transferred to voice mail. Message left.

## 2020-05-25 NOTE — Telephone Encounter (Signed)
Spoke with [facility] (pharmacist) with PCP and discussed medications the pt should be taking.  She has also been advised that he will be due for EGD in June.  She will make pt aware and also have him reach out to the office to make a follow up to discuss meds.  She does not feel that the pt is being compliant.

## 2020-05-26 ENCOUNTER — Other Ambulatory Visit: Payer: Self-pay | Admitting: Nurse Practitioner

## 2020-05-26 DIAGNOSIS — Z720 Tobacco use: Secondary | ICD-10-CM

## 2020-05-26 MED ORDER — NICOTINE 21 MG/24HR TD PT24: 21.0000 mg | MEDICATED_PATCH | TRANSDERMAL | 1 refills | Status: DC

## 2020-05-26 MED ORDER — NICOTINE POLACRILEX 4 MG MT GUM: 4.0000 mg | CHEWING_GUM | OROMUCOSAL | 0 refills | Status: DC | PRN

## 2020-05-26 NOTE — Patient Instructions (Signed)
Visit Information It was great speaking with you today!  Please let me know if you have any questions about our visit.  Goals Addressed            This Visit's Progress   . Manage My Medicine       Timeframe:  Long-Range Goal Priority:  High Start Date:                             Expected End Date:                       Follow Up Date: 08/25/2019     - call for medicine refill 2 or 3 days before it runs out - call if I am sick and can't take my medicine - use a pillbox to sort medicine    Why is this important?   . These steps will help you keep on track with your medicines.         Patient Care Plan: Wellness (Adult)    Problem Identified: Medication Adherence (Wellness)   Priority: Medium    Long-Range Goal: Medication Adherence Maintained   Start Date: 04/25/2020  Expected End Date: 10/26/2020  This Visit's Progress: On track  Priority: Medium  Note:   Current Barriers:   Ineffective Self Health Maintenance Clinical Goal(s):  [name] Collaboration with [name], FNP regarding development and update of comprehensive plan of care as evidenced by provider attestation and co-signature . Inter-disciplinary care team collaboration (see longitudinal plan of care)  patient will work with care management team to address care coordination and chronic disease management needs related to Disease Management  Educational Needs  Care Coordination  Medication Management and Education  Psychosocial Support   Interventions:   Evaluation of current treatment plan related to Asthma, chronic pain, personal history of stomach cancer, partial Seizures, Vitamin D deficiency and patient's adherence to plan as established by provider  Collaboration with [name], [place] regarding development and update of comprehensive plan of care as evidenced by provider attestation       and co-signature  Inter-disciplinary care team collaboration (see longitudinal plan of care)  Completed  a medication adherence assessment including barriers to medication adherence  Determined patient has some difficulty paying for his medications, but reports med adherence   Sent embedded Pharm D referral for assistance with drug cost   Discussed plans with patient for ongoing care management follow up and provided patient with direct contact information for care management team Self Care Activities:  . Collaborate with the embedded Pharm D for assistance with drug cost  . Take all medications exactly as prescribed . Call pharmacy for refills 7 days before taking last dose  . Notify the CCM team and or PCP if unable to afford medication refills  Patient Goals: - To be able to afford my medications  Follow Up Plan: Telephone follow up appointment with care management team member scheduled for: 08/08/20    Patient Care Plan: Vitamin D deficiency    Problem Identified: Vitamin D deficiency   Priority: High  Onset Date: 04/25/2020    Long-Range Goal: Vitamin D deficiency   Start Date: 04/25/2020  This Visit's Progress: On track  Priority: High  Note:   Current Barriers:   Ineffective Self Health Maintenance Clinical Goal(s):  [name] Collaboration with [name], FNP regarding development and update of comprehensive plan of care as evidenced by provider attestation and  co-signature . Inter-disciplinary care team collaboration (see longitudinal plan of care)  patient will work with care management team to address care coordination and chronic disease management needs related to Disease Management  Educational Needs  Care Coordination  Medication Management and Education  Psychosocial Support   Interventions:   Evaluation of current treatment plan related to  Vitamin D deficiency , self-management and patient's adherence to plan as established by provider.  Collaboration with [name], FNP regarding development and update of comprehensive plan of care as evidenced by provider  attestation       and co-signature  Inter-disciplinary care team collaboration (see longitudinal plan of care)  Discussed and reviewed current Vitamin D level noted to be 12.4; Educated patient on target range  Educated on dietary recommendations and importance of getting natural Vitamin D by spending time outdoors when possible   Reviewed and discussed on going monitoring of Vitamin D   Mailed printed educational materials related to Vitamin D deficiency for patient to review and discuss at next CCM RN f/u call   Discussed plans with patient for ongoing care management follow up and provided patient with direct contact information for care management team Self Care Activities:  . Take Vitamin D supplement as prescribed by PCP . Try to eat a Vitamin D rich diet  . Try to get at least 15 minutes of sunshine daily when possible  . Review and discuss patient educational materials related to Vitamin D deficiency  . Keep all PCP follow up appointments . Call PCP for questions or concerns  Patient Goals: - To improve and or resolve Vitamin D deficiency  Follow Up Plan: Telephone follow up appointment with care management team member scheduled for: 08/08/20    Patient Care Plan: CCM Pharmacy Care Plan    Problem Identified: Past History of Stomach Cancer, TOBACCO USE, VITAMIN D Deficiency   Priority: High    Long-Range Goal: Disease Management   Start Date: 05/18/2020  This Visit's Progress: On track  Priority: High  Note:     Current Barriers:  . Unable to independently monitor therapeutic efficacy   Pharmacist Clinical Goal(s):  [name] Patient will achieve adherence to monitoring guidelines and medication adherence to achieve therapeutic efficacy through collaboration with PharmD and provider.    Interventions: . 1:1 collaboration with [name], FNP regarding development and update of comprehensive plan of care as evidenced by provider attestation and  co-signature . Inter-disciplinary care team collaboration (see longitudinal plan of care) . Comprehensive medication review performed; medication list updated in electronic medical record   Tobacco use (Goal:Smoking Cessation) -Uncontrolled -Previous quit attempts: has tried to quit smoking previously would not give a total number  -Current treatment  . Nothing at this time  -Patient smokes After 30 minutes of waking -Patient triggers include: anxiety -On a scale of 1-10, reports MOTIVATION to quit is 10 -On a scale of 1-10, reports CONFIDENCE in quitting is 10  -Patient set quit date of 05/27/2020. Provided contact information for [place] [facility] (1-800-QUIT-NOW) and encouraged patient to reach out to this group for support. -Counseled on ways to quit smoking.  -Will have assistant call patient on 05/27/2020  -Will recommend that patient be placed on nicotine patches.  -Goal 21 mg nicotine patch once per day. Patient reports that at this time he would not be interested in starting two different smoking cessation techniques.   Vitamin D Deficiency  (Goal:Vitamin D between 30.0 - 100.0 ng/mL) -Uncontrolled -Current treatment  . Vitamin  D - taking 1 capsule two times per week.  o Patient reported taking two capsules once per week.  - Explained to patient the importance of taking 1 capsule twice per week on different days.   -Recommended to continue current medication  -Explained the importance of taking medication at the same time twice per week.   We discussed:  Recommend 1200 mg of calcium daily from dietary and supplemental sources. Recommend weight-bearing and muscle strengthening exercises for building and maintaining bone density.  Plan Continue current medications  Past History of Stomach Cancer   (Goal: alleviate signs and symptoms) -Controlled -Current treatment  . Dicyclomine 20 mg tablet four times daily before meals and at bedtime  . Famotidine 40 mg tablet twice a  day o Was prescribed by Dr. Laverta Baltimore on 08/2019  o Breakthrough reflux at bedtime  . Pantoprazole 40 mg tablet twice a day o 180 tablets last year in September  -Patient is not compliant, contacted [facility] for fill history and determined that patient has not been filling any of his medications. Contacted his gastroenterologist Dr. Edison Nasuti at [phone] for more information about patients medication regimen and which PPI he should be taking and if he should still be taking Famotidine. Spoke with Juliann Pulse at office who will provide to the nurse, Precious Bard and she will call me back with information on patient information . -Patient reports that he takes his medication everyday but [facility] reports that he does not fill them  -Will collaborate with Dr. Laverta Baltimore and PCP team to determine what medications should be on. -Spoke with Precious Bard RN who is the nurse of Dr. Laverta Baltimore:  -Has not been seen since June 2021.   -Patient had a no show in April, but he kept a May appointment last year.   -Scheduled for a recall Endoscopy in June.                  -Will get a letter when it is time that it will be scheduled at that time.   -May of last year treated in 16, 17, and 2021 for H. Pylori   -After Biopsy did not have h. Pylori  -Dr. [name] does not mention patient stopping the pantoprazole   -Patient has not requested any refills of these medications per Precious Bard   -Recommended to continue current medication    Health Maintenance -Vaccine gaps:  -Vaccinated against COVID-19 at [place]    -patient reports received first dose in February and second dose on May 3rd       -[facility]ed [facility] for more information                    -Eligible for booster, spoke with patient and advised that the patient needs to have these completed.   -[facility] at [facility]. - patient got Booster  -Reviewed patients [facility] information and did not see where the COVID -19 vaccine was documented in  registry. Spoke with patient who reports having card.  -Patients information will be updated in Epic once patient brings in [place] -19 vaccination card  -Educated on Cost vs benefit of each product must be carefully weighed by individual consumer -Patient is satisfied with current therapy and denies issues -Recommended to continue current medication   Patient Goals/Self-Care Activities . Patient will:  - take medications as prescribed focus on medication adherence by taking medication at the same time each day.   Follow Up Plan: Telephone  follow up appointment with care management team member scheduled for: 08/2020 The patient has been provided with contact information for the care management team and has been advised to call with any health related questions or concerns.       Mr. Lienau was given information about Chronic Care Management services today including:  1. CCM service includes personalized support from designated clinical staff supervised by his physician, including individualized plan of care and coordination with other care providers 2. 24/7 contact phone numbers for assistance for urgent and routine care needs. 3. Standard insurance, coinsurance, copays and deductibles apply for chronic care management only during months in which we provide at least 20 minutes of these services. Most insurances cover these services at 100%, however patients may be responsible for any copay, coinsurance and/or deductible if applicable. This service may help you avoid the need for more expensive face-to-face services. 4. Only one practitioner may furnish and bill the service in a calendar month. 5. The patient may stop CCM services at any time (effective at the end of the month) by phone call to the office staff.  Patient agreed to services and verbal consent obtained.   The patient verbalized understanding of instructions, educational materials, and care plan provided today and agreed to  receive a mailed copy of patient instructions, educational materials, and care plan.   Orlando Penner, PharmD Clinical Pharmacist [facility] [phone]

## 2020-06-15 ENCOUNTER — Telehealth: Payer: Self-pay

## 2020-06-15 NOTE — Chronic Care Management (AMB) (Signed)
Chronic Care Management Pharmacy Assistant   Name: [name]  MRN: [id number] DOB: 1970/12/30  Reason for Encounter: Disease State/ Smoking cessation  Recent office visits:  05-26-2020  Recent consult visits:  None  Hospital visits:  None in previous 6 months  Medications: Outpatient Encounter Medications as of 06/15/2020  Medication Sig  . bismuth subsalicylate (PEPTO-BISMOL) 262 MG chewable tablet Chew 2 tablets (524 mg total) by mouth 4 (four) times daily.  . [facility] STOMACH RELIEF 262 MG TABS Take 2 tablets by mouth 4 (four) times daily.  . Cyanocobalamin (B-12) 1000 MCG SUBL Place 1,000 mcg under the tongue daily.  [name] dicyclomine (BENTYL) 20 MG tablet Take 1 tablet (20 mg total) by mouth 4 (four) times daily -  before meals and at bedtime. (Patient taking differently: Take 100 mg by mouth 2 (two) times daily.)  . docusate sodium (COLACE) 100 MG capsule TAKE 1 CAPSULE BY MOUTH TWICE A DAY (Patient taking differently: Take 100 mg by mouth 2 (two) times daily.)  . famotidine (PEPCID) 40 MG tablet TAKE 1 TABLET BY MOUTH TWICE A DAY  . fluticasone (FLONASE) 50 MCG/ACT nasal spray Place 1 spray into both nostrils daily as needed for allergies or rhinitis.  . folic acid (FOLVITE) 1 MG tablet TAKE 1 TABLET BY MOUTH EVERY DAY  . levETIRAcetam (KEPPRA) 500 MG tablet Take 1 tablet (500 mg total) by mouth 2 (two) times daily.  [name] loratadine (CLARITIN) 10 MG tablet Take 1 tablet (10 mg total) by mouth daily.  . methocarbamol (ROBAXIN) 500 MG tablet Take 1 tablet (500 mg total) by mouth every 8 (eight) hours as needed for muscle spasms.  . metoCLOPramide (REGLAN) 10 MG tablet TAKE 1 TO 2 TABLETS BY MOUTH EVERY DAY AT BEDTIME (Patient not taking: No sig reported)  . Multiple Vitamin (MULTIVITAMIN WITH MINERALS) TABS tablet Take 1 tablet by mouth daily.  . nicotine (NICODERM CQ - DOSED IN MG/24 HOURS) 21 mg/24hr patch Place 1 patch (21 mg total) onto the skin daily.  . nicotine polacrilex  (NICORETTE STARTER KIT) 4 MG gum Take 1 each (4 mg total) by mouth as needed for smoking cessation.  [name] omeprazole (PRILOSEC) 20 MG capsule Take 1 capsule (20 mg total) by mouth 2 (two) times daily before a meal. (Patient not taking: Reported on 05/26/2020)  . ondansetron (ZOFRAN ODT) 4 MG disintegrating tablet Take 1 tablet (4 mg total) by mouth every 8 (eight) hours as needed.  [name] oxyCODONE (OXY IR/ROXICODONE) 5 MG immediate release tablet Take 1-2 tablets (5-10 mg total) by mouth every 6 (six) hours as needed.  . pantoprazole (PROTONIX) 40 MG tablet TAKE 1 TABLET BY MOUTH TWICE A DAY  . tetrahydrozoline-zinc (VISINE-AC) 0.05-0.25 % ophthalmic solution Place 2 drops into both eyes 2 (two) times daily as needed (Itching).   . thiamine (VITAMIN B-1) 100 MG tablet TAKE 1 TABLET BY MOUTH EVERY DAY (Patient taking differently: Take 100 mg by mouth daily.)  . Vitamin D, Ergocalciferol, (DRISDOL) 1.25 MG (50000 UNIT) CAPS capsule Take 1 capsule (50,000 Units total) by mouth 2 (two) times a week.   No facility-administered encounter medications on file as of 06/15/2020.   NOTE: Patient states he's been extremely busy and has a lot of personal things going on with his mother. Patient states he hasn't picked up the nicotine gum but will pick it up sometime this weekend. Patient stated that with everything going on he only smokes about 2 or 3 cigarettes in one week  but wants to quit his habit completely. Patient inquired about recommendations on protein shakes. Told patient I would ask for recommendations from [place]. Reminded patient of his appointment with Orlando Penner [facility] 08-24-20 at 9:00 and to call if he needs to reschedule.  [place]  [phone]

## 2020-07-13 ENCOUNTER — Emergency Department (HOSPITAL_COMMUNITY)
Admission: EM | Admit: 2020-07-13 | Discharge: 2020-07-13 | Disposition: A | Discharge status: Home / Self Care | Payer: Medicare HMO | Attending: Emergency Medicine | Admitting: Emergency Medicine

## 2020-07-13 DIAGNOSIS — Z5321 Procedure and treatment not carried out due to patient leaving prior to being seen by health care provider: Secondary | ICD-10-CM | POA: Insufficient documentation

## 2020-07-13 DIAGNOSIS — R197 Diarrhea, unspecified: Secondary | ICD-10-CM | POA: Diagnosis not present

## 2020-07-13 DIAGNOSIS — R109 Unspecified abdominal pain: Secondary | ICD-10-CM | POA: Diagnosis not present

## 2020-07-13 DIAGNOSIS — R457 State of emotional shock and stress, unspecified: Secondary | ICD-10-CM | POA: Diagnosis not present

## 2020-07-13 DIAGNOSIS — R112 Nausea with vomiting, unspecified: Secondary | ICD-10-CM | POA: Diagnosis not present

## 2020-07-13 DIAGNOSIS — R5381 Other malaise: Secondary | ICD-10-CM | POA: Diagnosis not present

## 2020-07-13 NOTE — ED Notes (Signed)
Vitals called x2

## 2020-07-13 NOTE — ED Triage Notes (Signed)
Pt's girlfriend called EMS because he hasn't been feeling well, acting right, or eating well for a few days. On EMS arrival pt having difficulty concentrating and following directions. Denies drug use. Pt endorses abdominal pain n/v/d but is answering "yes" to every question I ask him.

## 2020-07-13 NOTE — ED Notes (Signed)
Called Pt for vitals No answer.

## 2020-07-14 ENCOUNTER — Ambulatory Visit (INDEPENDENT_AMBULATORY_CARE_PROVIDER_SITE_OTHER): Payer: Medicare HMO

## 2020-07-14 DIAGNOSIS — G8929 Other chronic pain: Secondary | ICD-10-CM

## 2020-07-14 DIAGNOSIS — E559 Vitamin D deficiency, unspecified: Secondary | ICD-10-CM

## 2020-07-14 DIAGNOSIS — J45909 Unspecified asthma, uncomplicated: Secondary | ICD-10-CM

## 2020-07-14 NOTE — Chronic Care Management (AMB) (Signed)
Chronic Care Management    Social Work Note  07/14/2020 Name: [name] MRN: [id number] DOB: 03/14/1970  [name] is a 50 y.o. year old male who is a primary care patient of [name], [place]. The CCM team was consulted to assist the patient with chronic disease management and/or care coordination needs related to: Disease Management.   Engaged with patient by telephone for follow up after ED visit in response to provider referral for social work chronic care management and care coordination services.   Consent to Services:  The patient was given information about Chronic Care Management services, agreed to services, and gave verbal consent prior to initiation of services.  Please see initial visit note for detailed documentation.   Patient agreed to services and consent obtained.   Assessment: Review of patient past medical history, allergies, medications, and health status, including review of relevant consultants reports was performed today as part of a comprehensive evaluation and provision of chronic care management and care coordination services.     SDOH (Social Determinants of Health) assessments and interventions performed:    Advanced Directives Status: Not addressed in this encounter.  CCM Care Plan  Allergies  Allergen Reactions  . Aspirin Anaphylaxis  . Ibuprofen Anaphylaxis  . Hydrocodone Hives and Itching  . Tylenol [Acetaminophen]     Upset stomach    Outpatient Encounter Medications as of 07/14/2020  Medication Sig  . bismuth subsalicylate (PEPTO-BISMOL) 262 MG chewable tablet Chew 2 tablets (524 mg total) by mouth 4 (four) times daily.  . [facility]S STOMACH RELIEF 262 MG TABS Take 2 tablets by mouth 4 (four) times daily.  . Cyanocobalamin (B-12) 1000 MCG SUBL Place 1,000 mcg under the tongue daily.  [name] dicyclomine (BENTYL) 20 MG tablet Take 1 tablet (20 mg total) by mouth 4 (four) times daily -  before meals and at bedtime. (Patient taking differently:  Take 100 mg by mouth 2 (two) times daily.)  . docusate sodium (COLACE) 100 MG capsule TAKE 1 CAPSULE BY MOUTH TWICE A DAY (Patient taking differently: Take 100 mg by mouth 2 (two) times daily.)  . famotidine (PEPCID) 40 MG tablet TAKE 1 TABLET BY MOUTH TWICE A DAY  . fluticasone (FLONASE) 50 MCG/ACT nasal spray Place 1 spray into both nostrils daily as needed for allergies or rhinitis.  . folic acid (FOLVITE) 1 MG tablet TAKE 1 TABLET BY MOUTH EVERY DAY  . levETIRAcetam (KEPPRA) 500 MG tablet Take 1 tablet (500 mg total) by mouth 2 (two) times daily.  [name] loratadine (CLARITIN) 10 MG tablet Take 1 tablet (10 mg total) by mouth daily.  . methocarbamol (ROBAXIN) 500 MG tablet Take 1 tablet (500 mg total) by mouth every 8 (eight) hours as needed for muscle spasms.  . metoCLOPramide (REGLAN) 10 MG tablet TAKE 1 TO 2 TABLETS BY MOUTH EVERY DAY AT BEDTIME (Patient not taking: No sig reported)  . Multiple Vitamin (MULTIVITAMIN WITH MINERALS) TABS tablet Take 1 tablet by mouth daily.  . nicotine (NICODERM CQ - DOSED IN MG/24 HOURS) 21 mg/24hr patch Place 1 patch (21 mg total) onto the skin daily.  . nicotine polacrilex (NICORETTE STARTER KIT) 4 MG gum Take 1 each (4 mg total) by mouth as needed for smoking cessation.  [name] omeprazole (PRILOSEC) 20 MG capsule Take 1 capsule (20 mg total) by mouth 2 (two) times daily before a meal. (Patient not taking: Reported on 05/26/2020)  . ondansetron (ZOFRAN ODT) 4 MG disintegrating tablet Take 1 tablet (4 mg total)  by mouth every 8 (eight) hours as needed.  [name] oxyCODONE (OXY IR/ROXICODONE) 5 MG immediate release tablet Take 1-2 tablets (5-10 mg total) by mouth every 6 (six) hours as needed.  . pantoprazole (PROTONIX) 40 MG tablet TAKE 1 TABLET BY MOUTH TWICE A DAY  . tetrahydrozoline-zinc (VISINE-AC) 0.05-0.25 % ophthalmic solution Place 2 drops into both eyes 2 (two) times daily as needed (Itching).   . thiamine (VITAMIN B-1) 100 MG tablet TAKE 1 TABLET BY MOUTH EVERY DAY  (Patient taking differently: Take 100 mg by mouth daily.)  . Vitamin D, Ergocalciferol, (DRISDOL) 1.25 MG (50000 UNIT) CAPS capsule Take 1 capsule (50,000 Units total) by mouth 2 (two) times a week.   No facility-administered encounter medications on file as of 07/14/2020.    Patient Active Problem List   Diagnosis Date Noted  . Personal history of stomach cancer 04/19/2019  . Gastric cancer s/p distal gastrectomy 03/25/2019 03/25/2019  . Personal history of colonic polyps 01/29/2019  . History of pancreatitis 11/26/2017  . Hyponatremia   . Partial seizure ([place]) 03/05/2015  . History of seizures 03/05/2015  . Abdominal pain, chronic, epigastric 07/02/2014  . ABDOMINAL PAIN, GENERALIZED 03/07/2010  . KNEE PAIN, RIGHT 11/18/2009  . HYPERGLYCEMIA 09/08/2009  . ANKLE SPRAIN, LEFT 02/05/2009  . BACK STRAIN, LUMBAR 02/05/2009  . POLYURIA 10/28/2008  . ABDOMINAL PAIN, EPIGASTRIC 10/28/2008  . INSOMNIA, CHRONIC 10/10/2007  . DEPRESSION, CHRONIC 10/10/2007  . Asthma 10/10/2007  . WOUND INFECTION 10/10/2007  . History of alcohol abuse 04/15/2007  . 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 PANCREATITIS, CHRONIC 04/15/2007  . History of amputation of finger, right 04/16/2003    Conditions to be addressed/monitored: ASthma, Chronic Pain, Vitamin D Deficiency  Care Plan : Social Work [facility] Care Plan  Updates made by [name] since 07/14/2020 12:00 AM    Problem: Barriers to Treatment     Goal: Barriers to Treatment Identified and Managed   Start Date: 07/14/2020  Expected End Date: 08/28/2020  Priority: Medium  Note:   Current Barriers:  . Chronic disease management support and education needs related to  Asthma, Chronic Pain, Vitamin D Deficiency   . Recent ED visit - patient left without being seen . Concern with medication compliance  Social Worker Clinical Goal(s):  . patient will work with SW to identify and address any acute and/or chronic care coordination needs related to the self  health management of  Asthma, Chronic Pain, and Vitamin D Deficiency   . Patient will attend upcomming office visit scheduled 07/28/20 . Patient will work with PharmD to address medication compliance concerns  SW Interventions:  . Inter-disciplinary care team collaboration (see longitudinal plan of care) . Collaboration with [name], FNP regarding development and update of comprehensive plan of care as evidenced by provider attestation and co-signature . Received PING indicating recent ED visit . Performed chart review to note patient went to ED on 6.1.22- patient left without being seen after nurse triage "Pt's girlfriend called EMS because he hasn't been feeling well, acting right, or eating well for a few days. On EMS arrival pt having difficulty concentrating and following directions. Denies drug use. Pt endorses abdominal pain n/v/d but is answering "yes" to every question I ask him" . Performed chart review to note patient active with RN Care Manager and PharmD - last PharmD noted concern with medication noncompliance due to fill history . Successful outbound call placed to the patient to assess for care coordination needs . Patient reports he is feeling  better and thinks he "got too hot" - patient denies ongoing symptoms . Discussed the patient would like a refill of Vitamin B[facility] . Advised the patient SW would collaborate with his primary care provider to request refills . Reminded patient of upcomming scheduled appointment on 6.16.22 to see primary care provider  . Collaboration with primary care team including [name] FNP, [place], and Orlando Penner PharmD to advise of recent ED visit . Scheduled follow up call over the next 30 days  Patient Goals/Self-Care Activities . patient will:   - Attend upcomming scheduled office visit  Follow Up Plan:  SW will follow up with the patient over the next 30 days       Follow Up Plan: SW will  follow up with patient by phone over the next month      [name], BSW, CDP Social Worker, Certified Dementia Practitioner [place] [facility] [place] [facility] [phone]  Total time spent performing care coordination and/or care management activities with the patient by phone or face to face = 40 minutes.

## 2020-07-14 NOTE — Patient Instructions (Signed)
Social Worker Visit Information  Goals we discussed today:  Goals Addressed            This Visit's Progress   . Barriers to Treatment Identified and Managed       Timeframe:  Short-Term Goal Priority:  Medium Start Date:  6.2.22                           Expected End Date:   7.17.22                    Patient Goals/Self-Care Activities . patient will:   - Attend upcomming scheduled office visit        Follow Up Plan: SW will follow up with patient by phone over the next month   [name], BSW, CDP Social Worker, Certified Dementia Practitioner [place] [facility] [place] [facility] [phone]

## 2020-07-18 ENCOUNTER — Telehealth: Payer: Self-pay

## 2020-07-18 NOTE — Telephone Encounter (Signed)
Transition Care Management Follow-up Telephone Call  Date of discharge and from where: [facility] 07/13/2020  How have you been since you were released from the hospital? Don't know because of his left leg  sometimes goes numb and his left leg cramp  Any questions or concerns? Yes His left, the pt also wants to get a CNA Items Reviewed:  Did the pt receive and understand the discharge instructions provided? No he left because it was too muche going on that nigh  Medications obtained and verified? Yes  patient states he needs a refill on b12 subglingual  Other? No   Any new allergies since your discharge? No   Dietary orders reviewed? None given  Do you have support at home? Yes   Home Care and Equipment/Supplies: Were home health services ordered? no If so, what is the name of the agency?  Has the agency set up a time to come to the patient's home?  Were any new equipment or medical supplies ordered?  No What is the name of the medical supply agency? Were you able to get the supplies/equipment?  Do you have any questions related to the use of the equipment or supplies? No  Functional Questionnaire: (I = Independent and D = Dependent) ADLs:I  Bathing/Dressing- I  Meal Prep- I  Eating- I Maintaining continence- I  Transferring/Ambulation- I  Managing Meds- I  Follow up appointments reviewed:   PCP Hospital f/u appt confirmed? Yes  Scheduled to see Laurance Flatten, DNP on June 16 ane would like to know if he can come then for his follow-up  Specialist Hospital f/u appt confirmed? No    Are transportation arrangements needed? No   If their condition worsens, is the pt aware to call PCP or go to the Emergency Dept.? yes  Was the patient provided with contact information for the PCP's office or ED? yes  Was to pt encouraged to call back with questions or concerns? yes

## 2020-07-18 NOTE — Telephone Encounter (Signed)
If he already has an appt set up for June 16th this is okay.   Kindest Regards,   [name], DNP, FNP-BC

## 2020-07-28 ENCOUNTER — Ambulatory Visit: Payer: Medicare HMO | Admitting: Nurse Practitioner

## 2020-08-02 ENCOUNTER — Other Ambulatory Visit: Payer: Self-pay

## 2020-08-02 ENCOUNTER — Other Ambulatory Visit: Payer: Medicare HMO

## 2020-08-02 ENCOUNTER — Encounter: Payer: Self-pay | Admitting: Neurology

## 2020-08-02 ENCOUNTER — Ambulatory Visit (INDEPENDENT_AMBULATORY_CARE_PROVIDER_SITE_OTHER): Payer: Medicare HMO | Admitting: Neurology

## 2020-08-02 VITALS — BP 112/74 | HR 69 | Resp 18 | Ht 74.0 in

## 2020-08-02 DIAGNOSIS — Z8679 Personal history of other diseases of the circulatory system: Secondary | ICD-10-CM | POA: Diagnosis not present

## 2020-08-02 DIAGNOSIS — E538 Deficiency of other specified B group vitamins: Secondary | ICD-10-CM

## 2020-08-02 DIAGNOSIS — G40209 Localization-related (focal) (partial) symptomatic epilepsy and epileptic syndromes with complex partial seizures, not intractable, without status epilepticus: Secondary | ICD-10-CM | POA: Diagnosis not present

## 2020-08-02 LAB — VITAMIN B12: Vitamin B-12: 240 pg/mL (ref 211–911)

## 2020-08-02 MED ORDER — LEVETIRACETAM 500 MG PO TABS: 500.0000 mg | ORAL_TABLET | Freq: Two times a day (BID) | ORAL | 3 refills | Status: DC

## 2020-08-02 NOTE — Patient Instructions (Signed)
Good to see you.  Continue Keppra 500mg  twice a day  2. Bloodwork for B12 level  3. Follow-up in 1 year, call for any changes   Seizure Precautions: 1. If medication has been prescribed for you to prevent seizures, take it exactly as directed.  Do not stop taking the medicine without talking to your doctor first, even if you have not had a seizure in a long time.   2. Avoid activities in which a seizure would cause danger to yourself or to others.  Don't operate dangerous machinery, swim alone, or climb in high or dangerous places, such as on ladders, roofs, or girders.  Do not drive unless your doctor says you may.  3. If you have any warning that you may have a seizure, lay down in a safe place where you can't hurt yourself.    4.  No driving for 6 months from last seizure, as per [facility].   Please refer to the following link on the [place] website for more information: http://[email or web address]/[email or web address]/drivingu.cfm   5.  Maintain good sleep hygiene.  6.  Contact your doctor if you have any problems that may be related to the medicine you are taking.  7.  Call 911 and bring the patient back to the ED if:        A.  The seizure lasts longer than 5 minutes.       B.  The patient doesn't awaken shortly after the seizure  C.  The patient has new problems such as difficulty seeing, speaking or moving  D.  The patient was injured during the seizure  E.  The patient has a temperature over 102 F (39C)  F.  The patient vomited and now is having trouble breathing

## 2020-08-02 NOTE — Progress Notes (Signed)
NEUROLOGY FOLLOW UP OFFICE NOTE  [name] [id number] 1970/06/06  HISTORY OF PRESENT ILLNESS: I had the pleasure of seeing [name] in follow-up in the neurology clinic on 08/02/2020.  The patient was last seen a year ago for seizures. He is alone in the office today. He denies any seizures since 2017 on Levetiracetam 528m BID. He denies missing any doses. He had an incident last 07/13/20 which he feels was due to being out in the heat for 30 minutes. He went in to store and was holding a styrofoam cup with [place] which he apparently crushed, making the cashier upset. When he got home, his friend called EMS because he could not stand up, legs were jelly. His left arm was cramping and left leg was jelly. He was brought to the ER but left without being seen. EMS notes indicate his girlfriend called because he had not been feeling well, acting right, or eating well for a few days. When EMS arrived, he had difficulty concentrating and following directions. He "endorsed abdominal pain n/v/d but answering "yes" to every question asked." He left because he felt better after cooling down. He denies any further similar episodes. He lives alone. He denies any staring/unresponsive episodes, no further focal numbness/tingling/weakness, myoclonic jerks.No episodes of tongue bite or incontinence. He rarely drinks alcohol. He has occasional sleep difficulties. Mood is fine. He fell last Saturday and hit the right frontal region, no LOC. He has been taking B12 supplements since the surgery and asks about continuation.   History on Initial Assessment 05/01/2018: This is a pleasant 50year old right-handed man with a history of pancreatitis, subdural hematoma with subsequent seizure, presenting to establish care for seizures. In January 2017, he was found in a car with altered mental status, per notes people at the gas station close by had seen him in the car 24 hours prior. He was incontinent of urine and staring  at staff in the ER. He was found to have a right subdural hematoma with subfalcine herniation and left ventricular entrapment. He had burr holes to evacuate the hematoma with improvement in mental status. Family brought him back to the ER 3 days after hospital discharge reporting trance-like states, zoning out and staring straight ahead. He was witnessed to have a similar episode in the ER. Repeat head CT showed a persistent small amount of subdural fluid, right to left shift unchanged from prior scan. He was discharged home on Keppra 5028mBID and denies any further seizures since then. He lives alone and denies being told of any staring/unresponsive episodes, he denies any gaps in time, olfactory/gustatory hallucinations, focal numbness/tingling/weakness, myoclonic jerks. He denies any headaches. He recalls having bad headaches 2 days prior to his subdural, he denies any head injuries. He denies any diplopia, dysarthria/dysphagia, neck pain, bladder dysfunction. He has pancreatitis and wakes up every morning with nausea and vomiting. He feels better after he vomits but feels dizzy for a minute. He has had back pain even prior to his subdural, he takes Percocet for his stomach and back. He has constipation.   Epilepsy Risk Factors:  History of right subdural hematoma s/p burr hole. Otherwise he had a normal birth and early development.  There is no history of febrile convulsions, CNS infections such as meningitis/encephalitis, or family history of seizures.  I personally reviewed his most recent head CT done 09/2016 which did not show any acute changes, no further evidence of subdural fluid.   PAST MEDICAL HISTORY: Past  Medical History:  Diagnosis Date   Abdominal pain    intermittent   Allergy    SEASONAL   Anemia    Aneurysm (HCC)    brain   Asthma    Gastric cancer (HCC)    GERD (gastroesophageal reflux disease)    Pancreatitis    PTSD (post-traumatic stress disorder) 2008   Rectal polyp  01/09/2006   Hyperplastic   Seizures (HCC)    ADMITS TAKING MEDICATION FOR SEIZURE D/T ANEURYSM ,DO NOT KNOW IF HAD SEIZURE. 11/10/18    MEDICATIONS: Current Outpatient Medications on File Prior to Visit  Medication Sig Dispense Refill   bismuth subsalicylate (PEPTO-BISMOL) 262 MG chewable tablet Chew 2 tablets (524 mg total) by mouth 4 (four) times daily. 80 tablet 0   [facility] STOMACH RELIEF 262 MG TABS Take 2 tablets by mouth 4 (four) times daily.     Cyanocobalamin (B-12) 1000 MCG SUBL Place 1,000 mcg under the tongue daily.     dicyclomine (BENTYL) 20 MG tablet Take 1 tablet (20 mg total) by mouth 4 (four) times daily -  before meals and at bedtime. (Patient taking differently: Take 100 mg by mouth 2 (two) times daily.) 90 tablet 1   docusate sodium (COLACE) 100 MG capsule TAKE 1 CAPSULE BY MOUTH TWICE A DAY (Patient taking differently: Take 100 mg by mouth 2 (two) times daily.) 60 capsule 2   famotidine (PEPCID) 40 MG tablet TAKE 1 TABLET BY MOUTH TWICE A DAY 180 tablet 1   fluticasone (FLONASE) 50 MCG/ACT nasal spray Place 1 spray into both nostrils daily as needed for allergies or rhinitis.     folic acid (FOLVITE) 1 MG tablet TAKE 1 TABLET BY MOUTH EVERY DAY 90 tablet 0   levETIRAcetam (KEPPRA) 500 MG tablet Take 1 tablet (500 mg total) by mouth 2 (two) times daily. 180 tablet 3   loratadine (CLARITIN) 10 MG tablet Take 1 tablet (10 mg total) by mouth daily. 30 tablet 5   Multiple Vitamin (MULTIVITAMIN WITH MINERALS) TABS tablet Take 1 tablet by mouth daily.     omeprazole (PRILOSEC) 20 MG capsule Take 1 capsule (20 mg total) by mouth 2 (two) times daily before a meal. 20 capsule 0   pantoprazole (PROTONIX) 40 MG tablet TAKE 1 TABLET BY MOUTH TWICE A DAY 180 tablet 0   Vitamin D, Ergocalciferol, (DRISDOL) 1.25 MG (50000 UNIT) CAPS capsule Take 1 capsule (50,000 Units total) by mouth 2 (two) times a week. 24 capsule 1   methocarbamol (ROBAXIN) 500 MG tablet Take 1 tablet (500 mg total) by  mouth every 8 (eight) hours as needed for muscle spasms. (Patient not taking: No sig reported) 30 tablet 1   metoCLOPramide (REGLAN) 10 MG tablet TAKE 1 TO 2 TABLETS BY MOUTH EVERY DAY AT BEDTIME (Patient not taking: No sig reported) 60 tablet 0   nicotine (NICODERM CQ - DOSED IN MG/24 HOURS) 21 mg/24hr patch Place 1 patch (21 mg total) onto the skin daily. (Patient not taking: No sig reported) 30 patch 1   nicotine polacrilex (NICORETTE STARTER KIT) 4 MG gum Take 1 each (4 mg total) by mouth as needed for smoking cessation. (Patient not taking: No sig reported) 100 tablet 0   ondansetron (ZOFRAN ODT) 4 MG disintegrating tablet Take 1 tablet (4 mg total) by mouth every 8 (eight) hours as needed. (Patient not taking: Reported on 08/02/2020) 10 tablet 0   oxyCODONE (OXY IR/ROXICODONE) 5 MG immediate release tablet Take 1-2 tablets (5-10 mg total) by  mouth every 6 (six) hours as needed. (Patient not taking: No sig reported) 30 tablet 0   tetrahydrozoline-zinc (VISINE-AC) 0.05-0.25 % ophthalmic solution Place 2 drops into both eyes 2 (two) times daily as needed (Itching).  (Patient not taking: Reported on 08/02/2020)     thiamine (VITAMIN B-1) 100 MG tablet TAKE 1 TABLET BY MOUTH EVERY DAY (Patient taking differently: Take 100 mg by mouth daily.) 100 tablet 1   No current facility-administered medications on file prior to visit.    ALLERGIES: Allergies  Allergen Reactions   Aspirin Anaphylaxis   Ibuprofen Anaphylaxis   Hydrocodone Hives and Itching   Tylenol [Acetaminophen]     Upset stomach    FAMILY HISTORY: Family History  Problem Relation Age of Onset   Stroke Mother    Hypertension Mother    Diabetes Father    Pancreatic cancer Maternal Uncle    Colon cancer Maternal Uncle    Cancer Other    Diabetes Other    Colon cancer Maternal Grandfather    Colon cancer Paternal Grandfather    Pancreatic cancer Cousin    Esophageal cancer Neg Hx    Rectal cancer Neg Hx    Stomach cancer Neg  Hx     SOCIAL HISTORY: Social History   Socioeconomic History   Marital status: Single    Spouse name: Not on file   Number of children: Not on file   Years of education: Not on file   Highest education level: Not on file  Occupational History   Occupation: disable  Tobacco Use   Smoking status: Former    Packs/day: 0.00    Pack years: 0.00    Types: Cigars, Cigarettes    Quit date: 03/2019    Years since quitting: 1.3   Smokeless tobacco: Never   Tobacco comments:    smokes a black and mild daily  Vaping Use   Vaping Use: Never used  Substance and Sexual Activity   Alcohol use: No   Drug use: Yes    Types: Marijuana    Comment: last use 3 days ago per per 08-07-19   Sexual activity: Yes  Other Topics Concern   Not on file  Social History Narrative   Pt is R handed   Lives in single story home with his nephew   High school graduate   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 [employer]   Social Determinants of Health   Financial Resource Strain: Low Risk    Difficulty of Paying Living Expenses: Not hard at all  Food Insecurity: No Food Insecurity   Worried About Charity fundraiser in the Last Year: Never true   [place] in the Last Year: Never true  Transportation Needs: No Transportation Needs   Lack of Transportation (Medical): No   Lack of Transportation (Non-Medical): No  Physical Activity: Inactive   Days of Exercise per Week: 0 days   Minutes of Exercise per Session: 0 min  Stress: Stress Concern Present   Feeling of Stress : To some extent  Social Connections: Not on file  Intimate Partner Violence: Not on file     PHYSICAL EXAM: Vitals:   08/02/20 1510  BP: 112/74  Pulse: 69  Resp: 18  SpO2: 100%   General: No acute distress Head:  Normocephalic/atraumatic, poor dentition Skin/Extremities: No rash, no edema Neurological Exam: alert and oriented to person, place, and time. No aphasia or dysarthria. Fund of  knowledge is appropriate.  Attention and concentration are normal.   Cranial nerves: Pupils equal, round. Extraocular movements intact with no nystagmus. Visual fields full.  No facial asymmetry.  Motor: Bulk and tone normal, muscle strength 5/5 throughout with no pronator drift.   Finger to nose testing intact.  Gait narrow-based and steady, mild difficulty with tandem walk but able.   IMPRESSION: This is a 50 yo RH man with a history of pancreatitis, subdural hematoma s/p evacuation in 2017 who had focal seizures with impaired awareness a few days after hospital discharge in 2017. He denies any seizures since 2017 on Levetiracetam 574m BID. There was an incident last 6/1 where there appears to have been some confusion, which he attributes to the heat, no other similar episodes, continue to monitor. He is aware of [place] driving laws to stop driving after an episode of loss of awareness until 6 months seizure-free. Check B12 level, if normal, he can stop the B12 supplements. Follow-up in 1 year, call for any changes.   Thank you for allowing me to participate in his care.  Please do not hesitate to call for any questions or concerns.    KEllouise Newer M.D.   CC: JMinette Brine FNP

## 2020-08-03 NOTE — Progress Notes (Signed)
Left message to call office at [phone] 08/03/2020

## 2020-08-08 ENCOUNTER — Telehealth: Payer: Medicare HMO

## 2020-08-08 ENCOUNTER — Ambulatory Visit: Payer: Self-pay

## 2020-08-08 DIAGNOSIS — Z720 Tobacco use: Secondary | ICD-10-CM

## 2020-08-08 DIAGNOSIS — J45909 Unspecified asthma, uncomplicated: Secondary | ICD-10-CM | POA: Diagnosis not present

## 2020-08-08 DIAGNOSIS — R569 Unspecified convulsions: Secondary | ICD-10-CM

## 2020-08-08 DIAGNOSIS — E559 Vitamin D deficiency, unspecified: Secondary | ICD-10-CM

## 2020-08-08 DIAGNOSIS — Z0000 Encounter for general adult medical examination without abnormal findings: Secondary | ICD-10-CM

## 2020-08-08 DIAGNOSIS — Z85028 Personal history of other malignant neoplasm of stomach: Secondary | ICD-10-CM | POA: Diagnosis not present

## 2020-08-08 NOTE — Patient Instructions (Signed)
Goals Addressed      Endurance and Stamina improved to optimal strength/health   On track    Timeframe:  Long-Range Goal Priority:  High Start Date:  08/08/20                           Expected End Date: 08/08/21  Next Scheduled Follow up call: 10/07/20          Self Care Activities:  Patient verbalizes understanding of plan to resume daily exercise routine as tolerated  Self administers medications as prescribed Attends all scheduled provider appointments Calls pharmacy for medication refills Calls provider office for new concerns or questions Patient Goals: - improve stamina/endurance to optimal strength/health                    Improve and or Resolve Vitamin D deficiency   On track    Timeframe:  Long-Range Goal Priority:  High Start Date: 04/25/20                            Expected End Date: 04/25/21    Next Follow Up date: 10/07/20  Patient Goals/Self- Care Activities:      Take Vitamin D supplement as prescribed by PCP Try to eat a Vitamin D rich diet  Try to get at least 15 minutes of sunshine daily when possible  Review and discuss patient educational materials related to Vitamin D deficiency  Keep all PCP follow up appointments Call PCP for questions or concerns                   COMPLETED: Medication Adherence Maintained       Timeframe:  Long-Range Goal Priority:  Medium Start Date: 04/20/20                            Expected End Date: 10/21/20  Next Scheduled Follow Up date: 08/08/20  Patient Goals/Self-Care Activities:         Collaborate with the embedded Pharm D for assistance with drug cost  Take all medications exactly as prescribed Call pharmacy for refills 7 days before taking last dose  Notify the CCM team and or PCP if unable to afford medication refills

## 2020-08-08 NOTE — Chronic Care Management (AMB) (Signed)
Chronic Care Management   CCM RN Visit Note  08/08/2020 Name: [name] MRN: [id number] DOB: [birth year]  Subjective: [name] is a 50 y.o. year old male who is a primary care patient of [name], [place]. The care management team was consulted for assistance with disease management and care coordination needs.    Engaged with patient by telephone for follow up visit in response to provider referral for case management and/or care coordination services.   Consent to Services:  The patient was given information about Chronic Care Management services, agreed to services, and gave verbal consent prior to initiation of services.  Please see initial visit note for detailed documentation.   Patient agreed to services and verbal consent obtained.   Assessment: Review of patient past medical history, allergies, medications, health status, including review of consultants reports, laboratory and other test data, was performed as part of comprehensive evaluation and provision of chronic care management services.   SDOH (Social Determinants of Health) assessments and interventions performed:    CCM Care Plan  Allergies  Allergen Reactions   Aspirin Anaphylaxis   Ibuprofen Anaphylaxis   Hydrocodone Hives and Itching   Tylenol [Acetaminophen]     Upset stomach    Outpatient Encounter Medications as of 08/08/2020  Medication Sig   bismuth subsalicylate (PEPTO-BISMOL) 262 MG chewable tablet Chew 2 tablets (524 mg total) by mouth 4 (four) times daily.   [employer] STOMACH RELIEF 262 MG TABS Take 2 tablets by mouth 4 (four) times daily.   Cyanocobalamin (B-12) 1000 MCG SUBL Place 1,000 mcg under the tongue daily.   dicyclomine (BENTYL) 20 MG tablet Take 1 tablet (20 mg total) by mouth 4 (four) times daily -  before meals and at bedtime. (Patient taking differently: Take 100 mg by mouth 2 (two) times daily.)   docusate sodium (COLACE) 100 MG capsule TAKE 1 CAPSULE BY MOUTH TWICE A DAY  (Patient taking differently: Take 100 mg by mouth 2 (two) times daily.)   famotidine (PEPCID) 40 MG tablet TAKE 1 TABLET BY MOUTH TWICE A DAY   fluticasone (FLONASE) 50 MCG/ACT nasal spray Place 1 spray into both nostrils daily as needed for allergies or rhinitis.   folic acid (FOLVITE) 1 MG tablet TAKE 1 TABLET BY MOUTH EVERY DAY   levETIRAcetam (KEPPRA) 500 MG tablet Take 1 tablet (500 mg total) by mouth 2 (two) times daily.   loratadine (CLARITIN) 10 MG tablet Take 1 tablet (10 mg total) by mouth daily.   methocarbamol (ROBAXIN) 500 MG tablet Take 1 tablet (500 mg total) by mouth every 8 (eight) hours as needed for muscle spasms. (Patient not taking: No sig reported)   metoCLOPramide (REGLAN) 10 MG tablet TAKE 1 TO 2 TABLETS BY MOUTH EVERY DAY AT BEDTIME (Patient not taking: No sig reported)   Multiple Vitamin (MULTIVITAMIN WITH MINERALS) TABS tablet Take 1 tablet by mouth daily.   nicotine (NICODERM CQ - DOSED IN MG/24 HOURS) 21 mg/24hr patch Place 1 patch (21 mg total) onto the skin daily. (Patient not taking: No sig reported)   nicotine polacrilex (NICORETTE STARTER KIT) 4 MG gum Take 1 each (4 mg total) by mouth as needed for smoking cessation. (Patient not taking: No sig reported)   omeprazole (PRILOSEC) 20 MG capsule Take 1 capsule (20 mg total) by mouth 2 (two) times daily before a meal.   ondansetron (ZOFRAN ODT) 4 MG disintegrating tablet Take 1 tablet (4 mg total) by mouth every 8 (eight) hours as needed. (Patient  not taking: Reported on 08/02/2020)   oxyCODONE (OXY IR/ROXICODONE) 5 MG immediate release tablet Take 1-2 tablets (5-10 mg total) by mouth every 6 (six) hours as needed. (Patient not taking: No sig reported)   pantoprazole (PROTONIX) 40 MG tablet TAKE 1 TABLET BY MOUTH TWICE A DAY   tetrahydrozoline-zinc (VISINE-AC) 0.05-0.25 % ophthalmic solution Place 2 drops into both eyes 2 (two) times daily as needed (Itching).  (Patient not taking: Reported on 08/02/2020)   thiamine  (VITAMIN B-1) 100 MG tablet TAKE 1 TABLET BY MOUTH EVERY DAY (Patient taking differently: Take 100 mg by mouth daily.)   Vitamin D, Ergocalciferol, (DRISDOL) 1.25 MG (50000 UNIT) CAPS capsule Take 1 capsule (50,000 Units total) by mouth 2 (two) times a week.   No facility-administered encounter medications on file as of 08/08/2020.    Patient Active Problem List   Diagnosis Date Noted   Personal history of stomach cancer 04/19/2019   Gastric cancer s/p distal gastrectomy 03/25/2019 03/25/2019   Personal history of colonic polyps 01/29/2019   History of pancreatitis 11/26/2017   Hyponatremia    Partial seizure ([place]) 03/05/2015   History of seizures 03/05/2015   Abdominal pain, chronic, epigastric 07/02/2014   ABDOMINAL PAIN, GENERALIZED 03/07/2010   KNEE PAIN, RIGHT 11/18/2009   HYPERGLYCEMIA 09/08/2009   ANKLE SPRAIN, LEFT 02/05/2009   BACK STRAIN, LUMBAR 02/05/2009   POLYURIA 10/28/2008   ABDOMINAL PAIN, EPIGASTRIC 10/28/2008   INSOMNIA, CHRONIC 10/10/2007   DEPRESSION, CHRONIC 10/10/2007   Asthma 10/10/2007   WOUND INFECTION 10/10/2007   History of alcohol abuse 04/15/2007   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   PANCREATITIS, CHRONIC 04/15/2007   History of amputation of finger, 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/2003    Conditions to be addressed/monitored: Asthma, chronic pain, personal history of stomach cancer, partial Seizure, Vitamin D deficiency, Tobacco Use     Care Plan : Wellness (Adult)  Updates made by [name], RN since 08/08/2020 12:00 AM  Completed 08/08/2020   Problem: Medication Adherence (Wellness) Resolved 08/08/2020  Priority: Medium     Long-Range Goal: Medication Adherence Maintained Completed 08/08/2020  Start Date: 04/25/2020  Expected End Date: 10/26/2020  Recent Progress: On track  Priority: Medium  Note:   Current Barriers:  Ineffective Self Health Maintenance Clinical Goal(s):  Patient will work with care management team to address care coordination and chronic disease  management needs related to Disease Management Educational Needs Care Coordination Medication Management and Education Psychosocial Support   Interventions:  Evaluation of current treatment plan related to Asthma, chronic pain, personal history of stomach cancer, partial Seizures, Vitamin D deficiency and patient's adherence to plan as established by provider Collaboration with [name], [place] regarding development and update of comprehensive plan of care as evidenced by provider attestation       and co-signature Inter-disciplinary care team collaboration (see longitudinal plan of care) Determined patient is engaged with embedded Pharm D and denies having issues with medication adherence and or cost at this time  Discussed plans with patient for ongoing care management follow up and provided patient with direct contact information for care management team Self Care Activities:  Collaborate with the embedded Pharm D for assistance with drug cost  Take all medications exactly as prescribed Call pharmacy for refills 7 days before taking last dose  Notify the CCM team and or PCP if unable to afford medication refills  Patient Goals: - To be able to afford my medications  Follow Up Plan: Follow up with Pharm D as directed  Care Plan : Vitamin D deficiency  Updates made by [name], RN since 08/08/2020 12:00 AM     Problem: Vitamin D deficiency   Priority: High  Onset Date: 04/25/2020     Long-Range Goal: Vitamin D deficiency   Start Date: 04/25/2020  Expected End Date: 04/25/2021  Recent Progress: On track  Priority: High  Note:   Current Barriers:  Ineffective Self Health Maintenance Clinical Goal(s):  Patient will work with care management team to address care coordination and chronic disease management needs related to Disease Management Educational Needs Care Coordination Medication Management and Education Psychosocial Support   Interventions:  08/08/20  completed successful outbound call with patient  Evaluation of current treatment plan related to  Vitamin D deficiency , self-management and patient's adherence to plan as established by provider. Collaboration with [name], FNP regarding development and update of comprehensive plan of care as evidenced by provider attestation       and co-signature Inter-disciplinary care team collaboration (see longitudinal plan of care) Discussed and reviewed current Vitamin D level noted to be 12.4; Educated patient on target range Educated on dietary recommendations and importance of getting natural Vitamin D by spending time outdoors when possible  Reviewed and discussed on going monitoring of Vitamin D  Discussed plans with patient for ongoing care management follow up and provided patient with direct contact information for care management team Self Care Activities:  Take Vitamin D supplement as prescribed by PCP Try to eat a Vitamin D rich diet  Try to get at least 15 minutes of sunshine daily when possible  Review and discuss patient educational materials related to Vitamin D deficiency  Keep all PCP follow up appointments Call PCP for questions or concerns  Patient Goals: - To improve and or resolve Vitamin D deficiency   Follow Up Plan: Telephone follow up appointment with care management team member scheduled for: 10/07/20     Care Plan : General Plan of Care (Adult)  Updates made by [name], RN since 08/08/2020 12:00 AM     Problem: Poor endurance and stamina   Priority: High     Long-Range Goal: Endurance and Stamina improved to optimal health/strength   Start Date: 08/08/2020  Expected End Date: 08/08/2021  This Visit's Progress: On track  Priority: High  Note:   Current Barriers:  Ineffective Self Health Maintenance  Financial restraints  Clinical Goal(s):  Collaboration with [name], FNP regarding development and update of comprehensive plan of care as  evidenced by provider attestation and co-signature Inter-disciplinary care team collaboration (see longitudinal plan of care) patient will work with care management team to address care coordination and chronic disease management needs related to Disease Management Educational Needs Care Coordination Medication Management and Education Psychosocial Support   Interventions:  08/08/20 completed successful outbound call with patient  Evaluation of current treatment plan related to Asthma, chronic pain, personal history of stomach cancer, partial Seizure, Vitamin D deficiency, Tobacco Use   , self-management and patient's adherence to plan as established by provider. Collaboration with [name], FNP regarding development and update of comprehensive plan of care as evidenced by provider attestation       and co-signature Inter-disciplinary care team collaboration (see longitudinal plan of care) Determined patient would like to resume his weight lifting and water aerobics Educated on exercise recommendations (150 minutes weekly); Encouraged patient to start off slow and work his way up based on his tolerability  Encouraged patient to increase his water  intake to 64 oz daily unless outdoors on hot days, may need to increase to 80 oz per day unless otherwise directed Mailed printed educational material related to 6 Ways to be Water Wise  Determined patient is experiencing financial hardship and is unable to afford his bills, patient is engaged with embedded BSW, sent secure chat to [place] regarding patient's reported hardship and need for assistance with paying his bills Discussed with patient, next scheduled call with [place] set for 08/11/20 @ 12:15 PM  Discussed next PCP follow up with [name], FNP is scheduled for 08/09/20 @ 4:15 PM  Discussed plans with patient for ongoing care management follow up and provided patient with direct contact information for care management  team Self Care Activities:  Patient verbalizes understanding of plan to resume daily exercise routine as tolerated  Self administers medications as prescribed Attends all scheduled provider appointments Calls pharmacy for medication refills Calls provider office for new concerns or questions Patient Goals: - improve stamina/endurance to optimal strength/health   Follow Up Plan: Telephone follow up appointment with care management team member scheduled for: 10/07/20    Plan:Telephone follow up appointment with care management team member scheduled for:  10/07/20  Barb Merino, RN, BSN, CCM Care Management Coordinator [facility]  Direct Phone: [phone]

## 2020-08-09 ENCOUNTER — Ambulatory Visit: Payer: Medicare HMO | Admitting: Nurse Practitioner

## 2020-08-10 ENCOUNTER — Other Ambulatory Visit: Payer: Self-pay

## 2020-08-10 ENCOUNTER — Encounter: Payer: Self-pay | Admitting: Nurse Practitioner

## 2020-08-10 ENCOUNTER — Ambulatory Visit (INDEPENDENT_AMBULATORY_CARE_PROVIDER_SITE_OTHER): Payer: Medicare HMO | Admitting: Nurse Practitioner

## 2020-08-10 VITALS — BP 110/68 | HR 71 | Temp 98.1°F | Ht 74.0 in | Wt 146.8 lb

## 2020-08-10 DIAGNOSIS — I700 Atherosclerosis of aorta: Secondary | ICD-10-CM

## 2020-08-10 DIAGNOSIS — R1084 Generalized abdominal pain: Secondary | ICD-10-CM | POA: Diagnosis not present

## 2020-08-10 DIAGNOSIS — G40209 Localization-related (focal) (partial) symptomatic epilepsy and epileptic syndromes with complex partial seizures, not intractable, without status epilepticus: Secondary | ICD-10-CM

## 2020-08-10 MED ORDER — ATORVASTATIN CALCIUM 10 MG PO TABS: ORAL_TABLET | ORAL | 1 refills | Status: DC

## 2020-08-10 MED ORDER — OXYCODONE HCL 5 MG PO TABS: 5.0000 mg | ORAL_TABLET | Freq: Four times a day (QID) | ORAL | 0 refills | Status: DC | PRN

## 2020-08-10 NOTE — Progress Notes (Signed)
I,Yamilka Roman [employer] as a Education administrator for [employer], FNP.,have documented all relevant documentation on the behalf of [name], FNP,as directed by  [name], FNP while in the presence of [name], [place].   This visit occurred during the SARS-CoV-2 public health emergency.  Safety protocols were in place, including screening questions prior to the visit, additional usage of staff PPE, and extensive cleaning of exam room while observing appropriate contact time as indicated for disinfecting solutions.  Subjective:     Patient ID: [name] , male    DOB: [birth year] , 50 y.o.   MRN: [id number]   Chief Complaint  Patient presents with   would like to resume exercising    HPI  Patient presents today to be evaluated to make sure he is ok to go back to exercising.     Past Medical History:  Diagnosis Date   Abdominal pain    intermittent   Allergy    SEASONAL   Anemia    Aneurysm (HCC)    brain   Asthma    Gastric cancer (HCC)    GERD (gastroesophageal reflux disease)    Pancreatitis    PTSD (post-traumatic stress disorder) 2008   Rectal polyp 01/09/2006   Hyperplastic   Seizures (HCC)    ADMITS TAKING MEDICATION FOR SEIZURE D/T ANEURYSM ,DO NOT KNOW IF HAD SEIZURE. 11/10/18     Family History  Problem Relation Age of Onset   Stroke Mother    Hypertension Mother    Diabetes Father    Pancreatic cancer Maternal Uncle    Colon cancer Maternal Uncle    Cancer Other    Diabetes Other    Colon cancer Maternal Grandfather    Colon cancer Paternal Grandfather    Pancreatic cancer Cousin    Esophageal cancer Neg Hx    Rectal cancer Neg Hx    Stomach cancer Neg Hx      Current Outpatient Medications:    atorvastatin (LIPITOR) 10 MG tablet, Take 1 tablet by mouth at bedtime on MWF, Disp: 45 tablet, Rfl: 1   folic acid (FOLVITE) 1 MG tablet, TAKE 1 TABLET BY MOUTH EVERY DAY (Patient not taking: Reported on 08/18/2020), Disp: 90 tablet, Rfl: 0    levETIRAcetam (KEPPRA) 500 MG tablet, Take 1 tablet (500 mg total) by mouth 2 (two) times daily., Disp: 180 tablet, Rfl: 3   Multiple Vitamin (MULTIVITAMIN WITH MINERALS) TABS tablet, Take 1 tablet by mouth daily. (Patient not taking: Reported on 08/18/2020), Disp:  , Rfl:    thiamine (VITAMIN B-1) 100 MG tablet, TAKE 1 TABLET BY MOUTH EVERY DAY (Patient not taking: Reported on 08/18/2020), Disp: 100 tablet, Rfl: 1   Vitamin D, Ergocalciferol, (DRISDOL) 1.25 MG (50000 UNIT) CAPS capsule, Take 1 capsule (50,000 Units total) by mouth 2 (two) times a week., Disp: 24 capsule, Rfl: 1   oxyCODONE (OXY IR/ROXICODONE) 5 MG immediate release tablet, Take 1-2 tablets (5-10 mg total) by mouth every 6 (six) hours as needed., Disp: 20 tablet, Rfl: 0   polyvinyl alcohol (LIQUIFILM TEARS) 1.4 % ophthalmic solution, Place 1 drop into both eyes as needed for dry eyes., Disp: , Rfl:    Allergies  Allergen Reactions   Aspirin Anaphylaxis   Ibuprofen Anaphylaxis   Hydrocodone Hives and Itching   Tylenol [Acetaminophen]     Upset stomach     Review of Systems  Constitutional: Negative.   Eyes: Negative.   Cardiovascular: Negative.   Gastrointestinal:  Reports having pain to right abdomen radiating to his right back area  Musculoskeletal: Negative.   Skin: Negative.   Psychiatric/Behavioral: Negative.      Today's Vitals   08/10/20 1104  BP: 110/68  Pulse: 71  Temp: 98.1 F (36.7 C)  Weight: 146 lb 12.8 oz (66.6 kg)  Height: 6\' 2"  (1.88 m)   Body mass index is 18.85 kg/m.   Objective:  Physical Exam Vitals reviewed.  Constitutional:      General: He is not in acute distress.    Appearance: Normal appearance.     Comments: Thin frame, he has on coveralls that are really big  Cardiovascular:     Rate and Rhythm: Normal rate and regular rhythm.     Pulses: Normal pulses.     Heart sounds: Normal heart sounds. No murmur heard. Pulmonary:     Effort: Pulmonary effort is normal. No  respiratory distress.     Breath sounds: Normal breath sounds. No wheezing.  Abdominal:     General: Bowel sounds are normal. There is no distension.     Palpations: Abdomen is soft.     Tenderness: There is abdominal tenderness (generalized tenderness).  Musculoskeletal:        General: Normal range of motion.     Cervical back: Normal range of motion and neck supple.  Skin:    General: Skin is warm and dry.     Capillary Refill: Capillary refill takes less than 2 seconds.  Neurological:     General: No focal deficit present.     Mental Status: He is alert and oriented to person, place, and time.     Cranial Nerves: No cranial nerve deficit.     Motor: No weakness.  Psychiatric:        Mood and Affect: Mood normal.        Behavior: Behavior normal.        Thought Content: Thought content normal.        Judgment: Judgment normal.        Assessment And Plan:     1. Complex partial seizures with consciousness impaired [facility]) Denies any recent seizures  Continue follow up with Neurology  2. Atherosclerosis of aorta (HCC) Will start atorvastatin MWF Discussed risks of atherosclerosis Discussed side effects of statin and to make sure staying well hydrated with water.  - atorvastatin (LIPITOR) 10 MG tablet; Take 1 tablet by mouth at bedtime on MWF  Dispense: 45 tablet; Refill: 1  3. Generalized abdominal pain Reports continued pain to abdomen, I will supply him with limited supply of pain medication. Will request records from GI to see their treatment with abdomen pain. PDMP reviewed during this encounter. - oxyCODONE (OXY IR/ROXICODONE) 5 MG immediate release tablet; Take 1-2 tablets (5-10 mg total) by mouth every 6 (six) hours as needed.  Dispense: 20 tablet; Refill: 0   Explained to him he can start back exercising with light exercises, no heavy lifting but can gradually work up to this.   Patient was given opportunity to ask questions. Patient verbalized understanding of the  plan and was able to repeat key elements of the plan. All questions were answered to their satisfaction.  [name], FNP   I, [name], FNP, have reviewed all documentation for this visit. The documentation on 08/10/2020 for the exam, diagnosis, procedures, and orders are all accurate and complete.   IF YOU HAVE BEEN REFERRED TO A SPECIALIST, IT MAY TAKE 1-2 WEEKS TO SCHEDULE/PROCESS THE REFERRAL.  IF YOU HAVE NOT HEARD FROM US/SPECIALIST IN TWO WEEKS, PLEASE GIVE [place] A CALL AT [phone].   THE PATIENT IS ENCOURAGED TO PRACTICE SOCIAL DISTANCING DUE TO THE COVID-19 PANDEMIC.

## 2020-08-11 ENCOUNTER — Ambulatory Visit: Payer: Medicare HMO

## 2020-08-11 DIAGNOSIS — E559 Vitamin D deficiency, unspecified: Secondary | ICD-10-CM

## 2020-08-11 DIAGNOSIS — J45909 Unspecified asthma, uncomplicated: Secondary | ICD-10-CM

## 2020-08-11 DIAGNOSIS — R569 Unspecified convulsions: Secondary | ICD-10-CM

## 2020-08-11 DIAGNOSIS — Z85028 Personal history of other malignant neoplasm of stomach: Secondary | ICD-10-CM | POA: Diagnosis not present

## 2020-08-11 NOTE — Chronic Care Management (AMB) (Signed)
Chronic Care Management    Social Work Note  08/11/2020 Name: [name] MRN: [id number] DOB: Jan 30, 1971  [name] is a 50 y.o. year old male who is a primary care patient of [name], [place]. The CCM team was consulted to assist the patient with chronic disease management and/or care coordination needs related to: [employer] .   Engaged with patient by telephone for follow up visit in response to provider referral for social work chronic care management and care coordination services.   Consent to Services:  The patient was given information about Chronic Care Management services, agreed to services, and gave verbal consent prior to initiation of services.  Please see initial visit note for detailed documentation.   Patient agreed to services and consent obtained.   Assessment: Review of patient past medical history, allergies, medications, and health status, including review of relevant consultants reports was performed today as part of a comprehensive evaluation and provision of chronic care management and care coordination services.     SDOH (Social Determinants of Health) assessments and interventions performed:    Advanced Directives Status: Not addressed in this encounter.  CCM Care Plan  Allergies  Allergen Reactions   Aspirin Anaphylaxis   Ibuprofen Anaphylaxis   Hydrocodone Hives and Itching   Tylenol [Acetaminophen]     Upset stomach    Outpatient Encounter Medications as of 08/11/2020  Medication Sig   atorvastatin (LIPITOR) 10 MG tablet Take 1 tablet by mouth at bedtime on MWF   bismuth subsalicylate (PEPTO-BISMOL) 262 MG chewable tablet Chew 2 tablets (524 mg total) by mouth 4 (four) times daily.   [employer] STOMACH RELIEF 262 MG TABS Take 2 tablets by mouth 4 (four) times daily.   Cyanocobalamin (B-12) 1000 MCG SUBL Place 1,000 mcg under the tongue daily.   dicyclomine (BENTYL) 20 MG tablet Take 1 tablet (20 mg total) by mouth 4 (four) times  daily -  before meals and at bedtime. (Patient taking differently: Take 100 mg by mouth 2 (two) times daily.)   docusate sodium (COLACE) 100 MG capsule TAKE 1 CAPSULE BY MOUTH TWICE A DAY (Patient taking differently: Take 100 mg by mouth 2 (two) times daily.)   famotidine (PEPCID) 40 MG tablet TAKE 1 TABLET BY MOUTH TWICE A DAY   fluticasone (FLONASE) 50 MCG/ACT nasal spray Place 1 spray into both nostrils daily as needed for allergies or rhinitis.   folic acid (FOLVITE) 1 MG tablet TAKE 1 TABLET BY MOUTH EVERY DAY   levETIRAcetam (KEPPRA) 500 MG tablet Take 1 tablet (500 mg total) by mouth 2 (two) times daily.   loratadine (CLARITIN) 10 MG tablet Take 1 tablet (10 mg total) by mouth daily.   methocarbamol (ROBAXIN) 500 MG tablet Take 1 tablet (500 mg total) by mouth every 8 (eight) hours as needed for muscle spasms.   metoCLOPramide (REGLAN) 10 MG tablet TAKE 1 TO 2 TABLETS BY MOUTH EVERY DAY AT BEDTIME   Multiple Vitamin (MULTIVITAMIN WITH MINERALS) TABS tablet Take 1 tablet by mouth daily.   oxyCODONE (OXY IR/ROXICODONE) 5 MG immediate release tablet Take 1-2 tablets (5-10 mg total) by mouth every 6 (six) hours as needed.   thiamine (VITAMIN B-1) 100 MG tablet TAKE 1 TABLET BY MOUTH EVERY DAY (Patient taking differently: Take 100 mg by mouth daily.)   Vitamin D, Ergocalciferol, (DRISDOL) 1.25 MG (50000 UNIT) CAPS capsule Take 1 capsule (50,000 Units total) by mouth 2 (two) times a week.   No facility-administered encounter medications on  file as of 08/11/2020.    Patient Active Problem List   Diagnosis Date Noted   Personal history of stomach cancer 04/19/2019   Gastric cancer s/p distal gastrectomy 03/25/2019 03/25/2019   Personal history of colonic polyps 01/29/2019   History of pancreatitis 11/26/2017   Hyponatremia    Partial seizure ([place]) 03/05/2015   History of seizures 03/05/2015   Abdominal pain, chronic, epigastric 07/02/2014   ABDOMINAL PAIN, GENERALIZED 03/07/2010   KNEE PAIN,  RIGHT 11/18/2009   HYPERGLYCEMIA 09/08/2009   ANKLE SPRAIN, LEFT 02/05/2009   BACK STRAIN, LUMBAR 02/05/2009   POLYURIA 10/28/2008   ABDOMINAL PAIN, EPIGASTRIC 10/28/2008   INSOMNIA, CHRONIC 10/10/2007   DEPRESSION, CHRONIC 10/10/2007   Asthma 10/10/2007   WOUND INFECTION 10/10/2007   History of alcohol abuse 04/15/2007   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   PANCREATITIS, CHRONIC 04/15/2007   History of amputation of finger, right 04/16/2003    Conditions to be addressed/monitored:  Asthma, Chronic Pain, and Vitamin D Deficiency ;  Financial Resource needs  Care Plan : Social Work [facility] Care Plan  Updates made by [name] since 08/11/2020 12:00 AM     Problem: Financial Burden      Goal: Financial Resources Identified   Start Date: 08/11/2020  Expected End Date: 09/10/2020  Priority: High  Note:   Current Barriers:  Chronic disease management support and education needs related to  Asthma, Chronic Pain, and Vitamin D Deficiency   Financial constraints related to ability to afford rent and utility bill following loss of $1300  Social Worker Clinical Goal(s):  patient will work with SW to identify and address any acute and/or chronic care coordination needs related to the self health management of  Asthma, Chronic Pain, and Vitamin D Deficiency   Patient will Secondary school teacher and Soil scientist as directed by [facility]   SW Interventions:  Inter-disciplinary care team collaboration (see longitudinal plan of care) Collaboration with [name], [place] regarding development and update of comprehensive plan of care as evidenced by provider attestation and co-signature Collaboration with [place] who indicates patient experienced a possible seizure earlier in the month while attempting to pay bills - patient was seen in the ED and the next day the patient realized he lost $1300. Patient is unsure what happened to his money Successful outbound call  placed to the patient to assist with financial resources Discussed resources that may have funds available include DSS, Solicitor, and Soil scientist Determined the patient has visited [place] this morning and was informed they were not assisting with energy assistance at this time  Patient reports he plans to visit [employer] today to request assistance Provided the patient with contact number and address to [place] the patient has spoken with his landlord to explain the situation and she has agreed to work with the patient due to circumstance Scheduled follow up call over the next month Encouraged the patient to contact SW prior to scheduled appointment as needed  Patient Goals/Self-Care Activities patient will:   -  Secondary school teacher and [place] as needed prior to next scheduled call  Follow Up Plan:  SW will contact the patient over the next month      Follow Up Plan: SW will follow up with patient by phone over the next month      [name], BSW, CDP Social Worker, Certified Dementia Practitioner [place] [facility] [facility] [phone]

## 2020-08-11 NOTE — Patient Instructions (Signed)
Social Worker Visit Information  Goals we discussed today:   Goals Addressed             This Visit's Progress    Financial Resources Identified       Timeframe:  Short-Term Goal Priority:  High Start Date:   6.30.22                          Expected End Date: 7.30.22                       Next planned outreach: 7.28.22  Patient Goals/Self-Care Activities patient will:   - Secondary school teacher and [place] as needed prior to next scheduled call          Materials Provided: Verbal education about community resources provided by phone  Follow Up Plan: SW will follow up with patient by phone over the next month   [name], BSW, CDP Social Worker, Certified Dementia Practitioner [place] [facility] [place] [facility] [phone]

## 2020-08-18 ENCOUNTER — Encounter (HOSPITAL_COMMUNITY): Payer: Self-pay

## 2020-08-18 ENCOUNTER — Emergency Department (HOSPITAL_COMMUNITY)
Admission: EM | Admit: 2020-08-18 | Discharge: 2020-08-18 | Disposition: A | Discharge status: Home / Self Care | Payer: Medicare HMO | Attending: Emergency Medicine | Admitting: Emergency Medicine

## 2020-08-18 DIAGNOSIS — J45909 Unspecified asthma, uncomplicated: Secondary | ICD-10-CM | POA: Diagnosis not present

## 2020-08-18 DIAGNOSIS — R569 Unspecified convulsions: Secondary | ICD-10-CM

## 2020-08-18 DIAGNOSIS — Z87891 Personal history of nicotine dependence: Secondary | ICD-10-CM | POA: Diagnosis not present

## 2020-08-18 DIAGNOSIS — Z85028 Personal history of other malignant neoplasm of stomach: Secondary | ICD-10-CM | POA: Diagnosis not present

## 2020-08-18 DIAGNOSIS — G40909 Epilepsy, unspecified, not intractable, without status epilepticus: Secondary | ICD-10-CM | POA: Diagnosis not present

## 2020-08-18 LAB — CBC WITH DIFFERENTIAL/PLATELET
Abs Immature Granulocytes: 0.01 10*3/uL (ref 0.00–0.07)
Basophils Absolute: 0 10*3/uL (ref 0.0–0.1)
Basophils Relative: 1 %
Eosinophils Absolute: 0.1 10*3/uL (ref 0.0–0.5)
Eosinophils Relative: 2 %
HCT: 36.3 % — ABNORMAL LOW (ref 39.0–52.0)
Hemoglobin: 11.7 g/dL — ABNORMAL LOW (ref 13.0–17.0)
Immature Granulocytes: 0 %
Lymphocytes Relative: 39 %
Lymphs Abs: 1.3 10*3/uL (ref 0.7–4.0)
MCH: 30.9 pg (ref 26.0–34.0)
MCHC: 32.2 g/dL (ref 30.0–36.0)
MCV: 95.8 fL (ref 80.0–100.0)
Monocytes Absolute: 0.2 10*3/uL (ref 0.1–1.0)
Monocytes Relative: 6 %
Neutro Abs: 1.7 10*3/uL (ref 1.7–7.7)
Neutrophils Relative %: 52 %
Platelets: 143 10*3/uL — ABNORMAL LOW (ref 150–400)
RBC: 3.79 MIL/uL — ABNORMAL LOW (ref 4.22–5.81)
RDW: 14.9 % (ref 11.5–15.5)
WBC: 3.3 10*3/uL — ABNORMAL LOW (ref 4.0–10.5)
nRBC: 0 % (ref 0.0–0.2)

## 2020-08-18 LAB — COMPREHENSIVE METABOLIC PANEL
ALT: 28 U/L (ref 0–44)
AST: 29 U/L (ref 15–41)
Albumin: 3.7 g/dL (ref 3.5–5.0)
Alkaline Phosphatase: 47 U/L (ref 38–126)
Anion gap: 5 (ref 5–15)
BUN: 12 mg/dL (ref 6–20)
CO2: 28 mmol/L (ref 22–32)
Calcium: 8.6 mg/dL — ABNORMAL LOW (ref 8.9–10.3)
Chloride: 104 mmol/L (ref 98–111)
Creatinine, Ser: 0.64 mg/dL (ref 0.61–1.24)
GFR, Estimated: >60 mL/min (ref >60)
Glucose, Bld: 101 mg/dL — ABNORMAL HIGH (ref 70–99)
Potassium: 4.1 mmol/L (ref 3.5–5.1)
Sodium: 137 mmol/L (ref 135–145)
Total Bilirubin: 0.6 mg/dL (ref 0.3–1.2)
Total Protein: 6.5 g/dL (ref 6.5–8.1)

## 2020-08-18 MED ORDER — LEVETIRACETAM IN NACL 1000 MG/100ML IV SOLN
1000.0000 mg | Freq: Once | INTRAVENOUS | Status: AC
  Administered 2020-08-18: 1000 mg via INTRAVENOUS
  Filled 2020-08-18: qty 100

## 2020-08-18 NOTE — Discharge Instructions (Addendum)
You are presently taking Keppra 1 pill twice a day.  I want you to increase your morning dose so you are taking 2 pills in the morning and 1 pill in the evening.  And follow-up with your family doctor or your seizure doctor next week

## 2020-08-18 NOTE — ED Provider Notes (Signed)
[facility] DEPT Provider Note   CSN: [id number] Arrival date & time: 08/18/20  2263     History Chief Complaint  Patient presents with   Seizures    [name] is a 50 y.o. male.  Patient had a seizure today.  Patient states he has been taking his medicines  The history is provided by the patient and medical records. No language interpreter was used.  Seizures Seizure activity on arrival: yes   Seizure type:  Grand mal Preceding symptoms: no sensation of an aura present   Initial focality: unknown. Episode characteristics: abnormal movements   Postictal symptoms: confusion   Return to baseline: no   Severity:  Moderate Timing: 2.     Past Medical History:  Diagnosis Date   Abdominal pain    intermittent   Allergy    SEASONAL   Anemia    Aneurysm (HCC)    brain   Asthma    Gastric cancer (HCC)    GERD (gastroesophageal reflux disease)    Pancreatitis    PTSD (post-traumatic stress disorder) 2008   Rectal polyp 01/09/2006   Hyperplastic   Seizures ([place])    ADMITS TAKING MEDICATION FOR SEIZURE D/T ANEURYSM ,DO NOT KNOW IF HAD SEIZURE. 11/10/18    Patient Active Problem List   Diagnosis Date Noted   Personal history of stomach cancer 04/19/2019   Gastric cancer s/p distal gastrectomy 03/25/2019 03/25/2019   Personal history of colonic polyps 01/29/2019   History of pancreatitis 11/26/2017   Hyponatremia    Partial seizure ([place]) 03/05/2015   History of seizures 03/05/2015   Abdominal pain, chronic, epigastric 07/02/2014   ABDOMINAL PAIN, GENERALIZED 03/07/2010   KNEE PAIN, RIGHT 11/18/2009   HYPERGLYCEMIA 09/08/2009   ANKLE SPRAIN, LEFT 02/05/2009   BACK STRAIN, LUMBAR 02/05/2009   POLYURIA 10/28/2008   ABDOMINAL PAIN, EPIGASTRIC 10/28/2008   INSOMNIA, CHRONIC 10/10/2007   DEPRESSION, CHRONIC 10/10/2007   Asthma 10/10/2007   WOUND INFECTION 10/10/2007   History of alcohol abuse 04/15/2007   NARCOTIC ABUSE  04/15/2007   PANCREATITIS, CHRONIC 04/15/2007   History of amputation of finger, right 04/16/2003    Past Surgical History:  Procedure Laterality Date   arm surgery Left    repair of tendons "work injury cut"-"pinky finger numb"   BURR HOLE Right 02/28/2015   Procedure: BURR HOLES FOR SUBDURAL HEMATOMA;  Surgeon: Newman Pies, MD;  Location: [facility];  Service: Neurosurgery;  Laterality: Right;   CEREBRAL ANEURYSM REPAIR     COLONOSCOPY  2016   colon polyps   ESOPHAGOGASTRODUODENOSCOPY (EGD) WITH PROPOFOL N/A 01/13/2015   Procedure: ESOPHAGOGASTRODUODENOSCOPY (EGD) WITH PROPOFOL;  Surgeon: [name], MD;  Location: [facility];  Service: Endoscopy;  Laterality: N/A;   ESOPHAGOGASTRODUODENOSCOPY (EGD) WITH PROPOFOL N/A 03/19/2019   Procedure: ESOPHAGOGASTRODUODENOSCOPY (EGD) WITH PROPOFOL;  Surgeon: [name], MD;  Location: [facility];  Service: Endoscopy;  Laterality: N/A;   ESOPHAGOGASTRODUODENOSCOPY ENDOSCOPY     EUS N/A 03/19/2019   Procedure: UPPER ENDOSCOPIC ULTRASOUND (EUS) RADIAL;  Surgeon: [name], MD;  Location: [facility];  Service: Endoscopy;  Laterality: N/A;   HERNIA REPAIR     Umbilical   LAPAROSCOPIC GASTRECTOMY  03/25/2019   LAPAROSCOPY DIAGNOSTIC (N/A )   LAPAROSCOPY N/A 03/25/2019   Procedure: LAPAROSCOPY DIAGNOSTIC;  Surgeon: [name], MD;  Location: [facility];  Service: General;  Laterality: N/A;   LYMPH NODE BIOPSY N/A 03/25/2019   Procedure: Gastric Lymph Node Biopsy;  Surgeon: [name],  MD;  Location: [place] [facility];  Service: General;  Laterality: N/A;   PARTIAL GASTRECTOMY N/A 03/25/2019   Procedure: DISTAL SUBTOTAL GASTRECTOMY;  Surgeon: [name], MD;  Location: [facility];  Service: General;  Laterality: N/A;   SUBMUCOSAL INJECTION  03/19/2019   Procedure: SUBMUCOSAL INJECTION;  Surgeon: [name], MD;  Location: [facility];  Service: Endoscopy;;   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     UPPER GASTROINTESTINAL  ENDOSCOPY         Family History  Problem Relation Age of Onset   Stroke Mother    Hypertension Mother    Diabetes Father    Pancreatic cancer Maternal Uncle    Colon cancer Maternal Uncle    Cancer Other    Diabetes Other    Colon cancer Maternal Grandfather    Colon cancer Paternal Grandfather    Pancreatic cancer Cousin    Esophageal cancer Neg Hx    Rectal cancer Neg Hx    Stomach cancer Neg Hx     Social History   Tobacco Use   Smoking status: Former    Packs/day: 0.00    Pack years: 0.00    Types: Cigars, Cigarettes    Quit date: 03/2019    Years since quitting: 1.4   Smokeless tobacco: Never   Tobacco comments:    smokes a black and mild daily  Vaping Use   Vaping Use: Never used  Substance Use Topics   Alcohol use: No   Drug use: Yes    Types: Marijuana    Comment: last use 3 days ago per per 08-07-19    Home Medications Prior to Admission medications   Medication Sig Start Date End Date Taking? Authorizing Provider  atorvastatin (LIPITOR) 10 MG tablet Take 1 tablet by mouth at bedtime on MWF 08/10/20  Yes [name], FNP  levETIRAcetam (KEPPRA) 500 MG tablet Take 1 tablet (500 mg total) by mouth 2 (two) times daily. 08/02/20  Yes Cameron Sprang, MD  oxyCODONE (OXY IR/ROXICODONE) 5 MG immediate release tablet Take 1-2 tablets (5-10 mg total) by mouth every 6 (six) hours as needed. 08/10/20  Yes [name], FNP  polyvinyl alcohol (LIQUIFILM TEARS) 1.4 % ophthalmic solution Place 1 drop into both eyes as needed for dry eyes.   Yes [provider]  Vitamin D, Ergocalciferol, (DRISDOL) 1.25 MG (50000 UNIT) CAPS capsule Take 1 capsule (50,000 Units total) by mouth 2 (two) times a week. 04/28/20  Yes [name], FNP  folic acid (FOLVITE) 1 MG tablet TAKE 1 TABLET BY MOUTH EVERY DAY Patient not taking: Reported on 08/18/2020 09/24/19   [name], FNP  Multiple Vitamin (MULTIVITAMIN WITH MINERALS) TABS tablet Take 1 tablet by mouth daily. Patient  not taking: Reported on 08/18/2020 03/31/19   Jillyn Ledger, PA-C  thiamine (VITAMIN B-1) 100 MG tablet TAKE 1 TABLET BY MOUTH EVERY DAY Patient not taking: Reported on 08/18/2020 01/05/19   Rodriguez[place], Sunday Spillers, PA-C    Allergies    Aspirin, Ibuprofen, Hydrocodone, and Tylenol [acetaminophen]  Review of Systems   Review of Systems  Constitutional:  Negative for appetite change and fatigue.  HENT:  Negative for congestion, ear discharge and sinus pressure.   Eyes:  Negative for discharge.  Respiratory:  Negative for cough.   Cardiovascular:  Negative for chest pain.  Gastrointestinal:  Negative for abdominal pain and diarrhea.  Genitourinary:  Negative for frequency and hematuria.  Musculoskeletal:  Negative for back pain.  Skin:  Negative for  rash.  Neurological:  Positive for seizures. Negative for headaches.  Psychiatric/Behavioral:  Negative for hallucinations.    Physical Exam Updated Vital Signs BP 122/82   Pulse (!) 57   Temp 97.8 F (36.6 C) (Oral)   Resp 13   SpO2 99%   Physical Exam Vitals and nursing note reviewed.  Constitutional:      Appearance: He is well-developed.  HENT:     Head: Normocephalic.     Nose: Nose normal.  Eyes:     General: No scleral icterus.    Conjunctiva/sclera: Conjunctivae normal.  Neck:     Thyroid: No thyromegaly.  Cardiovascular:     Rate and Rhythm: Normal rate and regular rhythm.     Heart sounds: No murmur heard.   No friction rub. No gallop.  Pulmonary:     Breath sounds: No stridor. No wheezing or rales.  Chest:     Chest wall: No tenderness.  Abdominal:     General: There is no distension.     Tenderness: There is no abdominal tenderness. There is no rebound.  Musculoskeletal:        General: Normal range of motion.     Cervical back: Neck supple.  Lymphadenopathy:     Cervical: No cervical adenopathy.  Skin:    Findings: No erythema or rash.  Neurological:     Mental Status: He is alert and oriented to  person, place, and time.     Motor: No abnormal muscle tone.     Coordination: Coordination normal.  Psychiatric:        Behavior: Behavior normal.    ED Results / Procedures / Treatments   Labs (all labs ordered are listed, but only abnormal results are displayed) Labs Reviewed  CBC WITH DIFFERENTIAL/PLATELET - Abnormal; Notable for the following components:      Result Value   WBC 3.3 (*)    RBC 3.79 (*)    Hemoglobin 11.7 (*)    HCT 36.3 (*)    Platelets 143 (*)    All other components within normal limits  COMPREHENSIVE METABOLIC PANEL - Abnormal; Notable for the following components:   Glucose, Bld 101 (*)    Calcium 8.6 (*)    All other components within normal limits    EKG None  Radiology No results found.  Procedures Procedures   Medications Ordered in ED Medications  levETIRAcetam (KEPPRA) IVPB 1000 mg/100 mL premix (0 mg Intravenous Stopped 08/18/20 0820)    ED Course  I have reviewed the triage vital signs and the nursing notes.  Pertinent labs & imaging results that were available during my care of the patient were reviewed by me and considered in my medical decision making (see chart for details).    MDM Rules/Calculators/A&P                          Patient has seizure today.  Patient states he is taking his Keppra.  We will increase his morning dose so he is taking 1000 mg the morning and 500 in the evening and he will follow-up with his doctors Final Clinical Impression(s) / ED Diagnoses Final diagnoses:  Seizure [facility])    Rx / DC Orders ED Discharge Orders     None        Milton Ferguson, MD 08/18/20 1039

## 2020-08-18 NOTE — ED Triage Notes (Signed)
AAOx4 male brought by EMS from home. Per EMS pt had 2 seizures this am. Pt has hx of seizures and takes meds. Pt with no complaints.only reports that bit his bottom lip.

## 2020-08-22 ENCOUNTER — Telehealth: Payer: Self-pay

## 2020-08-22 NOTE — Telephone Encounter (Signed)
Transition Care Management Unsuccessful Follow-up Telephone Call  Date of discharge and from where:  08/18/2020 [facility]   Attempts:  1st Attempt  Reason for unsuccessful TCM follow-up call:  Left voice message

## 2020-08-23 ENCOUNTER — Telehealth: Payer: Self-pay

## 2020-08-23 NOTE — Progress Notes (Signed)
  Patient aware of telephone appointment with Orlando Penner CPP on 08-24-2020 at 9:00. Patient aware to have/bring all medications, supplements, blood pressure and/or blood sugar logs to visit.  Questions:  Have you had any recent office visit or specialist visit outside of [place]? Patient stated no other visits besides the recent ED visit.  Are there any concerns you would like to discuss during your office visit? Patient is concerned about what caused his recent seizure.  Are you having any problems obtaining your medications? (Whether it pharmacy issues or cost) Patient stated no.  Sent Azalee Course CMA a message to call patient back to schedule ED follow up.  Star Rating Drug: Atorvastatin 10 mg- Last filled 08-10-2020 [facility]  Any gaps in medications fill history? No  [place] Pharmacist Assistant [phone]

## 2020-08-23 NOTE — Telephone Encounter (Signed)
Transition Care Management Follow-up Telephone Call Date of discharge and from where: [facility]  How have you been since you were released from the hospital? Pt says he does feel better.  Any questions or concerns? No  Items Reviewed: Did the pt receive and understand the discharge instructions provided? Yes  Medications obtained and verified? Yes  Other? Yes  Any new allergies since your discharge? No  Dietary orders reviewed? No Do you have support at home? Yes   Home Care and Equipment/Supplies: Were home health services ordered? no If so, what is the name of the agency? N/a   Has the agency set up a time to come to the patient's home? not applicable Were any new equipment or medical supplies ordered?  No What is the name of the medical supply agency? N/a Were you able to get the supplies/equipment? not applicable Do you have any questions related to the use of the equipment or supplies? No  Functional Questionnaire: (I = Independent and D = Dependent) ADLs: I  Bathing/Dressing- I  Meal Prep- I  Eating- I  Maintaining continence- I  Transferring/Ambulation- I  Managing Meds- I  Follow up appointments reviewed:  //PCP Hospital f/u appt confirmed? Yes  Scheduled to see [name] on 09/01/2020 @ 4:00 [facility] f/u appt confirmed? No  Scheduled to see n/a on n/a  @ //n/a . Are transportation arrangements needed? No  If their condition worsens, is the pt aware to call PCP or go to the Emergency Dept.? Yes Was the patient provided with contact information for the PCP's office or ED? Yes Was to pt encouraged to call back with questions or concerns? Yes

## 2020-08-24 ENCOUNTER — Ambulatory Visit (INDEPENDENT_AMBULATORY_CARE_PROVIDER_SITE_OTHER): Payer: Medicare HMO

## 2020-08-24 DIAGNOSIS — Z720 Tobacco use: Secondary | ICD-10-CM

## 2020-08-24 DIAGNOSIS — G40209 Localization-related (focal) (partial) symptomatic epilepsy and epileptic syndromes with complex partial seizures, not intractable, without status epilepticus: Secondary | ICD-10-CM

## 2020-08-24 DIAGNOSIS — J45909 Unspecified asthma, uncomplicated: Secondary | ICD-10-CM

## 2020-08-24 DIAGNOSIS — I700 Atherosclerosis of aorta: Secondary | ICD-10-CM

## 2020-08-24 DIAGNOSIS — R569 Unspecified convulsions: Secondary | ICD-10-CM | POA: Diagnosis not present

## 2020-08-24 DIAGNOSIS — E559 Vitamin D deficiency, unspecified: Secondary | ICD-10-CM
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
## 2020-08-25 NOTE — Patient Instructions (Addendum)
Visit Information It was great speaking with you today!  Please let me know if you have any questions about our visit.   Goals Addressed             This Visit's Progress    Manage My Medicine       Timeframe:  Long-Range Goal Priority:  High Start Date:                             Expected End Date:                       Follow Up Date: 02/01/2021   - call for medicine refill 2 or 3 days before it runs out - call if I am sick and can't take my medicine - use a pillbox to sort medicine    Why is this important?   These steps will help you keep on track with your medicines.          Patient Care Plan: CCM Pharmacy Care Plan     Problem Identified: Atherosclerosis of Aorta, Smoking Cessation, Seizure   Priority: High     Long-Range Goal: Disease Management   Start Date: 05/18/2020  Recent Progress: On track  Priority: High  Note:     Current Barriers:  Does not maintain contact with provider office Does not contact provider office for questions/concerns  Pharmacist Clinical Goal(s):  Patient will achieve adherence to monitoring guidelines and medication adherence to achieve therapeutic efficacy through collaboration with PharmD and provider.   Interventions: 1:1 collaboration with [name], FNP regarding development and update of comprehensive plan of care as evidenced by provider attestation and co-signature Inter-disciplinary care team collaboration (see longitudinal plan of care) Comprehensive medication review performed; medication list updated in electronic medical record  Atherosclerosis of Aorta: (LDL goal < 70) -Uncontrolled -Current treatment: Atorvastatin 10 mg tablet on Monday, Wednesday and Friday  -Current dietary patterns: he is eating more cereal, chicken and fish.  -Current exercise habits: exercising more push ups, lifting weights and riding his bike. He spends about an hour exercising everyday.  -Educated on Cholesterol goals;   Benefits of statin for ASCVD risk reduction; Strategies to manage statin-induced myalgias; -Recommended to continue current medication Recommended patient begin to do exercises that he is comfortable with   Seizure (Goal: Reduce seizure frequency) -Not ideally controlled -Current treatment  Levetiracetam 500 mg tablet twice per day  Patients medication changed at the hospital to 1000 mg in the morning and 500 mg in the evening.  -Recommended to continue current medication Collaborated with PCP team to make sure patient is scheduled for Neurology appointment following his recent seizure.   -Patient reports that he is going to schedule  the appointment   Tobacco use (Goal Quit Smoking) -Controlled -Previous quit attempts: First  -Current treatment  Not taking any medication  -Patient smokes After 30 minutes of waking -Patient triggers include: watching television -On a scale of 1-10, reports MOTIVATION to quit is 10 -On a scale of 1-10, reports CONFIDENCE in quitting is 10 -Provided contact information for [place] Quit Line (1-800-QUIT-NOW) and encouraged patient to reach out to this group for support. -Congratulated patient on quitting smoking, he reports that he has been nicotine free for two days.    Health Maintenance -Vaccine gaps: Pneumonia Vaccine, Shingrix   Patient Goals/Self-Care Activities Patient will:  - take medications as prescribed  Follow Up Plan: Telephone follow  up appointment with care management team member scheduled for: 02/01/2021 The patient has been provided with contact information for the care management team and has been advised to call with any health related questions or concerns.          Patient agreed to services and verbal consent obtained.   The patient verbalized understanding of instructions, educational materials, and care plan provided today and agreed to receive a mailed copy of patient instructions, educational materials, and care  plan.   Orlando Penner, PharmD Clinical Pharmacist [facility] [phone]

## 2020-09-01 ENCOUNTER — Other Ambulatory Visit: Payer: Self-pay

## 2020-09-01 ENCOUNTER — Ambulatory Visit (INDEPENDENT_AMBULATORY_CARE_PROVIDER_SITE_OTHER): Payer: Medicare HMO | Admitting: Nurse Practitioner

## 2020-09-01 ENCOUNTER — Encounter: Payer: Self-pay | Admitting: Nurse Practitioner

## 2020-09-01 VITALS — BP 110/68 | HR 88 | Temp 98.6°F | Ht 69.0 in | Wt 156.0 lb

## 2020-09-01 DIAGNOSIS — R569 Unspecified convulsions: Secondary | ICD-10-CM

## 2020-09-01 DIAGNOSIS — Z23 Encounter for immunization: Secondary | ICD-10-CM | POA: Diagnosis not present

## 2020-09-01 MED ORDER — SHINGRIX 50 MCG/0.5ML IM SUSR
0.5000 mL | Freq: Once | INTRAMUSCULAR | 0 refills | Status: AC
Start: 2020-09-01 — End: 2020-09-01
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
## 2020-09-07 ENCOUNTER — Encounter: Payer: Self-pay | Admitting: Nurse Practitioner

## 2020-09-08 ENCOUNTER — Telehealth: Payer: Medicare HMO

## 2020-09-08 ENCOUNTER — Ambulatory Visit: Payer: Self-pay

## 2020-09-08 DIAGNOSIS — R569 Unspecified convulsions: Secondary | ICD-10-CM

## 2020-09-08 DIAGNOSIS — J45909 Unspecified asthma, uncomplicated: Secondary | ICD-10-CM

## 2020-09-08 DIAGNOSIS — E559 Vitamin D deficiency, unspecified: Secondary | ICD-10-CM

## 2020-09-08 NOTE — Chronic Care Management (AMB) (Signed)
  Care Management   Follow Up Note   09/08/2020 Name: Angle Herod MRN: [id number] DOB: September 15, 1970   Referred by: [name], FNP Reason for referral : Chronic Care Management   SW placed a successful outbound call to the patient to follow up on goal progression. SW introduced self to the patient by stating "this is Tillie Rung from [facility]". Patient immediately yelled "no it's not and quit calling my mother fucking phone!". Patient then hung up on SW.  Follow Up Plan:  Collaboration with RN Care Manager to advise SW would close current goals. SW is available to work with patient in the future if desired.  [name], BSW, CDP Social Worker, Certified Dementia Practitioner [place] [facility] [place] [facility] [phone]

## 2020-09-10 LAB — DRUG PROFILE, UR, 9 DRUGS (LABCORP)
Amphetamines, Urine: NEGATIVE ng/mL
Barbiturate Quant, Ur: NEGATIVE ng/mL
Benzodiazepine Quant, Ur: NEGATIVE ng/mL
Cannabinoid Quant, Ur: POSITIVE — AB
Cocaine (Metab.): NEGATIVE ng/mL
Methadone Screen, Urine: NEGATIVE ng/mL
Opiate Quant, Ur: NEGATIVE ng/mL
PCP Quant, Ur: NEGATIVE ng/mL
Propoxyphene: NEGATIVE ng/mL

## 2020-09-11 ENCOUNTER — Encounter: Payer: Self-pay | Admitting: Gastroenterology

## 2020-09-27 ENCOUNTER — Telehealth: Payer: Self-pay

## 2020-09-27 NOTE — Chronic Care Management (AMB) (Addendum)
    Chronic Care Management Pharmacy Assistant   Name: [name]  MRN: [id number] DOB: 1970/03/20  Reason for Encounter: Disease State/ Smoking cessation   Recent office visits:  09-01-2020 [name], [place]. Shingrix given.  09-08-2020 [name] (CCM)  Recent consult visits:  None  Hospital visits:  Medication Reconciliation was completed by comparing discharge summary, patient's EMR and Pharmacy list, and upon discussion with patient.  Admitted to the hospital on 08-18-2020 due to seizure. Discharge date was 08-18-2020. Discharged from [place]?Medications Started at [facility] Discharge:?? None  Medication Changes at Hospital Discharge: Increase Keppra 500 mg daily to twice daily  Medications Discontinued at Hospital Discharge: None  Medications that remain the same after Hospital Discharge:??  -All other medications will remain the same.    Medications: Outpatient Encounter Medications as of 09/27/2020  Medication Sig Note   atorvastatin (LIPITOR) 10 MG tablet Take 1 tablet by mouth at bedtime on MWF    folic acid (FOLVITE) 1 MG tablet TAKE 1 TABLET BY MOUTH EVERY DAY    levETIRAcetam (KEPPRA) 500 MG tablet Take 1 tablet (500 mg total) by mouth 2 (two) times daily.    Multiple Vitamin (MULTIVITAMIN WITH MINERALS) TABS tablet Take 1 tablet by mouth daily.    oxyCODONE (OXY IR/ROXICODONE) 5 MG immediate release tablet Take 1-2 tablets (5-10 mg total) by mouth every 6 (six) hours as needed.    polyvinyl alcohol (LIQUIFILM TEARS) 1.4 % ophthalmic solution Place 1 drop into both eyes as needed for dry eyes.    thiamine (VITAMIN B-1) 100 MG tablet TAKE 1 TABLET BY MOUTH EVERY DAY    Vitamin D, Ergocalciferol, (DRISDOL) 1.25 MG (50000 UNIT) CAPS capsule Take 1 capsule (50,000 Units total) by mouth 2 (two) times a week. 08/18/2020: Monday and Fridays   No facility-administered encounter medications on file as of 09/27/2020.   Patient states  seizures are doing good and he is down to one cigarette weekly. Patient states he doesn't inhale the cigarette just puffs and blow it out.   Care Gaps: Last flu vaccine 10-29-2019 Last Pneumococcal 03-02-2015 RAF= 2.026% Medicare wellness 05-04-2021  Star Rating Drugs: Atorvastatin 10 mg- Last filled 08-10-2020 [facility]  [place] Clinical Pharmacist Assistant [phone]

## 2020-10-07 ENCOUNTER — Ambulatory Visit (INDEPENDENT_AMBULATORY_CARE_PROVIDER_SITE_OTHER): Payer: Medicare HMO

## 2020-10-07 ENCOUNTER — Telehealth: Payer: Medicare HMO

## 2020-10-07 ENCOUNTER — Encounter: Payer: Self-pay | Admitting: Nurse Practitioner

## 2020-10-07 DIAGNOSIS — E559 Vitamin D deficiency, unspecified: Secondary | ICD-10-CM

## 2020-10-07 DIAGNOSIS — J45909 Unspecified asthma, uncomplicated: Secondary | ICD-10-CM

## 2020-10-07 DIAGNOSIS — Z0000 Encounter for general adult medical examination without abnormal findings: Secondary | ICD-10-CM

## 2020-10-07 DIAGNOSIS — R569 Unspecified convulsions: Secondary | ICD-10-CM

## 2020-10-07 DIAGNOSIS — Z720 Tobacco use: Secondary | ICD-10-CM

## 2020-10-12 DIAGNOSIS — J45909 Unspecified asthma, uncomplicated: Secondary | ICD-10-CM | POA: Diagnosis not present

## 2020-10-12 DIAGNOSIS — R569 Unspecified convulsions: Secondary | ICD-10-CM

## 2020-10-12 DIAGNOSIS — E559 Vitamin D deficiency, unspecified: Secondary | ICD-10-CM

## 2020-10-18 NOTE — Patient Instructions (Signed)
Goals Addressed      Endurance and Stamina improved to optimal strength/health   On track    Timeframe:  Long-Range Goal Priority:  High Start Date:  08/08/20                           Expected End Date: 08/08/21  Next Scheduled Follow up call: 01/09/21          Self Care Activities:  Patient verbalizes understanding of plan to resume daily exercise routine as tolerated  Self administers medications as prescribed Attends all scheduled provider appointments Calls pharmacy for medication refills Calls provider office for new concerns or questions Patient Goals: - improve stamina/endurance to optimal strength/health                   Improve and or Resolve Vitamin D deficiency   On track    Timeframe:  Long-Range Goal Priority:  High Start Date: 04/25/20                            Expected End Date: 04/25/21    Next Follow Up date: 01/09/21  Self Care Activities:  Keep all PCP follow up appointments Call PCP for questions or concerns  Patient Goals: Take Vitamin D supplement as prescribed by PCP Try to eat a Vitamin D rich diet  Try to get at least 15 minutes of sunshine daily when possible                  Seizure disorder complications prevented or minimized   On track    Timeframe:  Long-Range Goal Priority:  High Start Date:  10/07/20                           Expected End Date: 10/07/21   Next Scheduled follow up: 01/09/21          Self Care Activities:  Self administers medications as prescribed Attends all scheduled provider appointments Calls pharmacy for medication refills Calls provider office for new concerns or questions Patient Goals: - avoid Seizure triggers - record all Seizure activity and alert Neurologist or PCP of increased Seizure activity

## 2020-10-18 NOTE — Chronic Care Management (AMB) (Signed)
Chronic Care Management   CCM RN Visit Note  10/07/2020 Name: [name] MRN: [id number] DOB: [birth year]  Subjective: [name] is a 50 y.o. year old male who is a primary care patient of [name], [place]. The care management team was consulted for assistance with disease management and care coordination needs.    Engaged with patient by telephone for follow up visit in response to provider referral for case management and/or care coordination services.   Consent to Services:  The patient was given information about Chronic Care Management services, agreed to services, and gave verbal consent prior to initiation of services.  Please see initial visit note for detailed documentation.   Patient agreed to services and verbal consent obtained.   Assessment: Review of patient past medical history, allergies, medications, health status, including review of consultants reports, laboratory and other test data, was performed as part of comprehensive evaluation and provision of chronic care management services.   SDOH (Social Determinants of Health) assessments and interventions performed:  Yes, no acute needs   CCM Care Plan  Allergies  Allergen Reactions   Aspirin Anaphylaxis   Ibuprofen Anaphylaxis   Hydrocodone Hives and Itching   Tylenol [Acetaminophen]     Upset stomach    Outpatient Encounter Medications as of 10/07/2020  Medication Sig Note   atorvastatin (LIPITOR) 10 MG tablet Take 1 tablet by mouth at bedtime on MWF    folic acid (FOLVITE) 1 MG tablet TAKE 1 TABLET BY MOUTH EVERY DAY    levETIRAcetam (KEPPRA) 500 MG tablet Take 1 tablet (500 mg total) by mouth 2 (two) times daily.    Multiple Vitamin (MULTIVITAMIN WITH MINERALS) TABS tablet Take 1 tablet by mouth daily.    oxyCODONE (OXY IR/ROXICODONE) 5 MG immediate release tablet Take 1-2 tablets (5-10 mg total) by mouth every 6 (six) hours as needed.    polyvinyl alcohol (LIQUIFILM TEARS) 1.4 % ophthalmic  solution Place 1 drop into both eyes as needed for dry eyes.    thiamine (VITAMIN B-1) 100 MG tablet TAKE 1 TABLET BY MOUTH EVERY DAY    Vitamin D, Ergocalciferol, (DRISDOL) 1.25 MG (50000 UNIT) CAPS capsule Take 1 capsule (50,000 Units total) by mouth 2 (two) times a week. 08/18/2020: Monday and Fridays   No facility-administered encounter medications on file as of 10/07/2020.    Patient Active Problem List   Diagnosis Date Noted   Personal history of stomach cancer 04/19/2019   Gastric cancer s/p distal gastrectomy 03/25/2019 03/25/2019   Personal history of colonic polyps 01/29/2019   History of pancreatitis 11/26/2017   Hyponatremia    Partial seizure ([place]) 03/05/2015   History of seizures 03/05/2015   Abdominal pain, chronic, epigastric 07/02/2014   ABDOMINAL PAIN, GENERALIZED 03/07/2010   KNEE PAIN, RIGHT 11/18/2009   HYPERGLYCEMIA 09/08/2009   ANKLE SPRAIN, LEFT 02/05/2009   BACK STRAIN, LUMBAR 02/05/2009   POLYURIA 10/28/2008   ABDOMINAL PAIN, EPIGASTRIC 10/28/2008   INSOMNIA, CHRONIC 10/10/2007   DEPRESSION, CHRONIC 10/10/2007   Asthma 10/10/2007   WOUND INFECTION 10/10/2007   History of alcohol abuse 04/15/2007   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   PANCREATITIS, CHRONIC 04/15/2007   History of amputation of finger, right 04/16/2003    Conditions to be addressed/monitored: Asthma, chronic pain, personal history of stomach cancer, partial Seizure, Vitamin D deficiency, Tobacco Use     Care Plan : Vitamin D deficiency  Updates made by [name], RN since 10/07/2020 12:00 AM     Problem: Vitamin D  deficiency   Priority: High  Onset Date: 04/25/2020     Long-Range Goal: Vitamin D deficiency   Start Date: 04/25/2020  Expected End Date: 04/25/2021  Recent Progress: On track  Priority: High  Note:   Current Barriers:  Ineffective Self Health Maintenance Clinical Goal(s):  Patient will work with care management team to address care coordination and chronic disease  management needs related to Disease Management Educational Needs Care Coordination Medication Management and Education Psychosocial Support   Interventions:  10/07/20 completed successful outbound call with patient  Evaluation of current treatment plan related to  Vitamin D deficiency , self-management and patient's adherence to plan as established by provider. Collaboration with [name], FNP regarding development and update of comprehensive plan of care as evidenced by provider attestation       and co-signature Inter-disciplinary care team collaboration (see longitudinal plan of care) Provided education to patient about basic disease process related to Vitamin D deficiency  Review of patient status, including review of consultant's reports, relevant laboratory and other test results, and medications completed. Reviewed medications with patient and discussed importance of medication adherence Educated on ways to improve Vitamin D by adding Vitamin D rich foods, get at least 15 minutes of natural sunlight when possible, take supplement as directed Discussed plans with patient for ongoing care management follow up and provided patient with direct contact information for care management team Self Care Activities:  Keep all PCP follow up appointments Call PCP for questions or concerns  Patient Goals: Take Vitamin D supplement as prescribed by PCP Try to eat a Vitamin D rich diet  Try to get at least 15 minutes of sunshine daily when possible   Follow Up Plan: Telephone follow up appointment with care management team member scheduled for: 01/09/21     Care Plan : General Plan of Care (Adult)  Updates made by [name], RN since 10/07/2020 12:00 AM     Problem: Poor endurance and stamina   Priority: High     Long-Range Goal: Endurance and Stamina improved to optimal health/strength   Start Date: 08/08/2020  Expected End Date: 08/08/2021  Recent Progress: On track  Priority:  High  Note:   Current Barriers:  Ineffective Self Health Maintenance  Financial restraints  Clinical Goal(s):  Collaboration with [name], FNP regarding development and update of comprehensive plan of care as evidenced by provider attestation and co-signature Inter-disciplinary care team collaboration (see longitudinal plan of care) patient will work with care management team to address care coordination and chronic disease management needs related to Disease Management Educational Needs Care Coordination Medication Management and Education Psychosocial Support   Interventions:  10/07/20 completed successful outbound call with patient  Evaluation of current treatment plan related to Asthma, chronic pain, personal history of stomach cancer, partial Seizure, Vitamin D deficiency, Tobacco Use   , self-management and patient's adherence to plan as established by provider. Collaboration with [name], FNP regarding development and update of comprehensive plan of care as evidenced by provider attestation       and co-signature Inter-disciplinary care team collaboration (see longitudinal plan of care) Review of patient status, including review of consultant's reports, relevant laboratory and other test results, and medications completed. Reviewed medications with patient and discussed importance of medication adherence Discussed plans with patient for ongoing care management follow up and provided patient with direct contact information for care management team Self Care Activities:  Patient verbalizes understanding of plan to resume daily exercise routine as  tolerated  Self administers medications as prescribed Attends all scheduled provider appointments Calls pharmacy for medication refills Calls provider office for new concerns or questions Patient Goals: - improve stamina/endurance to optimal strength/health - document any/all seizure activity and notify MD promptly    Follow  Up Plan: Telephone follow up appointment with care management team member scheduled for: 01/09/21     Care Plan : Seizure Disorder  Updates made by [name], RN since 10/07/2020 12:00 AM     Problem: Seizure disorder   Priority: High     Long-Range Goal: Seizure disorder compliations prevented or minimized   Start Date: 10/07/2020  Expected End Date: 10/07/2021  This Visit's Progress: On track  Priority: High  Note:   Current Barriers:  Ineffective Self Health Maintenance in a patient with Asthma, chronic pain, personal history of stomach cancer, partial Seizure, Vitamin D deficiency, Tobacco Use    Unable to drive self due to Seizure disorder Clinical Goal(s):  Collaboration with [name], FNP regarding development and update of comprehensive plan of care as evidenced by provider attestation and co-signature Inter-disciplinary care team collaboration (see longitudinal plan of care) patient will work with care management team to address care coordination and chronic disease management needs related to Disease Management Educational Needs Care Coordination Medication Management and Education Medication Reconciliation Psychosocial Support   Interventions:  10/07/20 completed successful outbound call with patient  Evaluation of current treatment plan related to  Seizure disorder , Transportation self-management and patient's adherence to plan as established by provider. Collaboration with [name], FNP regarding development and update of comprehensive plan of care as evidenced by provider attestation       and co-signature Inter-disciplinary care team collaboration (see longitudinal plan of care) Provided education to patient about basic disease process related to Seizures  Review of patient status, including review of consultant's reports, relevant laboratory and other test results, and medications completed. Reviewed medications with patient and discussed importance  of medication adherence Determined patient completed recent Neuro follow up and is adhering to medication change, reports no recent Seizure activity Educated on home safety and importance of avoiding triggers such as becoming overheated Educated on importance to record seizures and report increased activity to Neurologist promptly Assessed for transportation needs due to patient is unable to drive self, patient is using public bus or walks to destination  Discussed plans with patient for ongoing care management follow up and provided patient with direct contact information for care management team Self Care Activities:  Self administers medications as prescribed Attends all scheduled provider appointments Calls pharmacy for medication refills Calls provider office for new concerns or questions Patient Goals: - avoid Seizure triggers - record all Seizure activity and alert Neurologist or PCP of increased Seizure activity   Follow Up Plan: Telephone follow up appointment with care management team member scheduled for: 01/09/21     Plan:Telephone follow up appointment with care management team member scheduled for:  01/09/21  Barb Merino, RN, BSN, CCM Care Management Coordinator [place] [facility]  Direct Phone: [phone]

## 2020-11-10 ENCOUNTER — Telehealth: Payer: Self-pay

## 2020-11-10 NOTE — Chronic Care Management (AMB) (Signed)
    Chronic Care Management Pharmacy Assistant   Name: [name]  MRN: [id number] DOB: 08/17/1970   Reason for Encounter: Disease State/ Smoking cessation  Recent office visits:  10-07-2020 Little, [name], RN (CCM)  Recent consult visits:  None  Hospital visits:  Medication Reconciliation was completed by comparing discharge summary, patient's EMR and Pharmacy list, and upon discussion with patient.   Admitted to the hospital on 08-18-2020 due to seizure. Discharge date was 08-18-2020. Discharged from [place]?Medications Started at [facility] Discharge:?? None   Medication Changes at Hospital Discharge: Increase Keppra 500 mg daily to twice daily   Medications Discontinued at Hospital Discharge: None   Medications that remain the same after Hospital Discharge:??  -All other medications will remain the same.      Medications: Outpatient Encounter Medications as of 11/10/2020  Medication Sig Note   atorvastatin (LIPITOR) 10 MG tablet Take 1 tablet by mouth at bedtime on MWF    folic acid (FOLVITE) 1 MG tablet TAKE 1 TABLET BY MOUTH EVERY DAY    levETIRAcetam (KEPPRA) 500 MG tablet Take 1 tablet (500 mg total) by mouth 2 (two) times daily.    Multiple Vitamin (MULTIVITAMIN WITH MINERALS) TABS tablet Take 1 tablet by mouth daily.    oxyCODONE (OXY IR/ROXICODONE) 5 MG immediate release tablet Take 1-2 tablets (5-10 mg total) by mouth every 6 (six) hours as needed.    polyvinyl alcohol (LIQUIFILM TEARS) 1.4 % ophthalmic solution Place 1 drop into both eyes as needed for dry eyes.    thiamine (VITAMIN B-1) 100 MG tablet TAKE 1 TABLET BY MOUTH EVERY DAY    Vitamin D, Ergocalciferol, (DRISDOL) 1.25 MG (50000 UNIT) CAPS capsule Take 1 capsule (50,000 Units total) by mouth 2 (two) times a week. 08/18/2020: Monday and Fridays   No facility-administered encounter medications on file as of 11/10/2020.   11-10-2020:1st attempt left VM 11-11-2020: 2nd attempt  left VM  Care Gaps: Last flu vaccine 10-29-2019 Last Pneumococcal 03-02-2015 RAF= 2.026% Medicare wellness 05-04-2021  Star Rating Drugs: Atorvastatin 10 mg- Last filled [facility]10-2020 [facility]  [place] Clinical Pharmacist Assistant [phone]

## 2021-01-09 ENCOUNTER — Telehealth: Payer: Medicare HMO

## 2021-01-09 ENCOUNTER — Ambulatory Visit (INDEPENDENT_AMBULATORY_CARE_PROVIDER_SITE_OTHER): Payer: Medicare HMO

## 2021-01-09 DIAGNOSIS — J45909 Unspecified asthma, uncomplicated: Secondary | ICD-10-CM

## 2021-01-09 DIAGNOSIS — Z720 Tobacco use: Secondary | ICD-10-CM

## 2021-01-09 DIAGNOSIS — E559 Vitamin D deficiency, unspecified: Secondary | ICD-10-CM

## 2021-01-09 DIAGNOSIS — R569 Unspecified convulsions: Secondary | ICD-10-CM

## 2021-01-09 DIAGNOSIS — G8929 Other chronic pain: Secondary | ICD-10-CM

## 2021-01-09 NOTE — Chronic Care Management (AMB) (Signed)
Chronic Care Management   CCM RN Visit Note  01/09/2021 Name: [name] MRN: [id number] DOB: May 21, 1970  Subjective: [name] is a 50 y.o. year old male who is a primary care patient of [name], [place]. The care management team was consulted for assistance with disease management and care coordination needs.    Engaged with patient by telephone for follow up visit in response to provider referral for case management and/or care coordination services.   Consent to Services:  The patient was given information about Chronic Care Management services, agreed to services, and gave verbal consent prior to initiation of services.  Please see initial visit note for detailed documentation.   Patient agreed to services and verbal consent obtained.   Assessment: Review of patient past medical history, allergies, medications, health status, including review of consultants reports, laboratory and other test data, was performed as part of comprehensive evaluation and provision of chronic care management services.   SDOH (Social Determinants of Health) assessments and interventions performed:  Yes, no acute needs   CCM Care Plan  Allergies  Allergen Reactions   Aspirin Anaphylaxis   Ibuprofen Anaphylaxis   Hydrocodone Hives and Itching   Tylenol [Acetaminophen]     Upset stomach    Outpatient Encounter Medications as of 01/09/2021  Medication Sig Note   atorvastatin (LIPITOR) 10 MG tablet Take 1 tablet by mouth at bedtime on MWF    folic acid (FOLVITE) 1 MG tablet TAKE 1 TABLET BY MOUTH EVERY DAY    levETIRAcetam (KEPPRA) 500 MG tablet Take 1 tablet (500 mg total) by mouth 2 (two) times daily.    Multiple Vitamin (MULTIVITAMIN WITH MINERALS) TABS tablet Take 1 tablet by mouth daily.    oxyCODONE (OXY IR/ROXICODONE) 5 MG immediate release tablet Take 1-2 tablets (5-10 mg total) by mouth every 6 (six) hours as needed.    polyvinyl alcohol (LIQUIFILM TEARS) 1.4 % ophthalmic  solution Place 1 drop into both eyes as needed for dry eyes.    thiamine (VITAMIN B-1) 100 MG tablet TAKE 1 TABLET BY MOUTH EVERY DAY    Vitamin D, Ergocalciferol, (DRISDOL) 1.25 MG (50000 UNIT) CAPS capsule Take 1 capsule (50,000 Units total) by mouth 2 (two) times a week. 08/18/2020: Monday and Fridays   No facility-administered encounter medications on file as of 01/09/2021.    Patient Active Problem List   Diagnosis Date Noted   Personal history of stomach cancer 04/19/2019   Gastric cancer s/p distal gastrectomy 03/25/2019 03/25/2019   Personal history of colonic polyps 01/29/2019   History of pancreatitis 11/26/2017   Hyponatremia    Partial seizure ([place]) 03/05/2015   History of seizures 03/05/2015   Abdominal pain, chronic, epigastric 07/02/2014   ABDOMINAL PAIN, GENERALIZED 03/07/2010   KNEE PAIN, RIGHT 11/18/2009   HYPERGLYCEMIA 09/08/2009   ANKLE SPRAIN, LEFT 02/05/2009   BACK STRAIN, LUMBAR 02/05/2009   POLYURIA 10/28/2008   ABDOMINAL PAIN, EPIGASTRIC 10/28/2008   INSOMNIA, CHRONIC 10/10/2007   DEPRESSION, CHRONIC 10/10/2007   Asthma 10/10/2007   WOUND INFECTION 10/10/2007   History of alcohol abuse 04/15/2007   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   PANCREATITIS, CHRONIC 04/15/2007   History of amputation of finger, right 04/16/2003    Conditions to be addressed/monitored: Asthma, chronic pain, personal history of stomach cancer, partial Seizure, Vitamin D deficiency, Tobacco Use     Care Plan : RN Care Manager Plan of Care  Updates made by [name], RN since 01/09/2021 12:00 AM  Problem: No plan of care established for management of chronic disease states (Asthma, chronic pain, personal history of stomach cancer, partial Seizure, Vitamin D deficiency, Tobacco Use)   Priority: High     Long-Range Goal: Development of plan of care for chronic disease managment for Asthma, chronic pain, personal history of stomach cancer, partial Seizure, Vitamin D deficiency,  Tobacco Use   This Visit's Progress: On track  Priority: High  Note:   Current Barriers:   Knowledge Deficits related to plan of care for management of Asthma, chronic pain, personal history of stomach cancer, partial Seizure, Vitamin D deficiency, Tobacco Use     Chronic Disease Management support and education needs related to Asthma, chronic pain, personal history of stomach cancer, partial Seizure, Vitamin D deficiency, Tobacco Use      RNCM Clinical Goal(s):  Patient will verbalize basic understanding of  Asthma, chronic pain, personal history of stomach cancer, partial Seizure, Vitamin D deficiency, Tobacco Use     disease process and self health management plan as evidenced by patient will experience no disease exacerbations related to his chronic disease states take all medications exactly as prescribed and will call provider for medication related questions as evidenced by patient will report having no missed doses of prescribed medication attend all scheduled medical appointments: PCP follow up/Neurology follow up  as evidenced by patient will keep scheduled appointments  demonstrate Ongoing health management independence as evidenced by    through collaboration with RN Care manager, provider, and care team.   Interventions: 1:1 collaboration with primary care provider regarding development and update of comprehensive plan of care as evidenced by provider attestation and co-signature Inter-disciplinary care team collaboration (see longitudinal plan of care) Evaluation of current treatment plan related to  self management and patient's adherence to plan as established by provider  (Status:  Goal on track:  Yes.)  Long Term Goal Evaluation of current treatment plan related to  Seizure disorder , self-management and patient's adherence to plan as established by provider. Advised patient to record and report any/all Seizure activity Reviewed medications with patient and discussed  indication, dosage and frequency of his prescribed medication; Reinforced the importance of medication adherence for best effectiveness Reviewed scheduled/upcoming provider appointments including: Neuro follow up with Dr. Delice Lesch scheduled for 08/04/20 @4 :00 PM  Discussed plans with patient for ongoing care management follow up and provided patient with direct contact information for care management team  (Status:  Goal on track:  Yes.)  Long Term Goal Evaluation of current treatment plan related to  Vitamin D deficiency ,  self-management and patient's adherence to plan as established by provider. Review of patient status, including review of consultant's reports, relevant laboratory and other test results, and medications completed. Reviewed medications with patient and discussed indication, dosage and frequency of prescribed medication; reinforced importance of medication adherence for best effectiveness  Reviewed scheduled/upcoming provider appointments including: next PCP follow up scheduled for 05/04/21 @2 :40 PM Discussed plans with patient for ongoing care management follow up and provided patient with direct contact information for care management team Vit D, 25-Hydroxy 30.0 - 100.0 ng/mL 12.4 Low    04/21/20   (Status:  New goal.)  Long Term Goal Evaluation of current treatment plan related to Asthma and   self-management and patient's adherence to plan as established by provider. Discussed plans with patient for ongoing care management follow up and provided patient with direct contact information for care management team Provided education to patient re: potential triggers that may  exacerbate Asthma such as being outdoors during colder temperatures Reviewed medications with patient and discussed patient is not currently prescribed to take any medications and or inhalers for Asthma Provided patient with printed educational materials related to Asthma Action Plan and how to avoid potential  triggers for Asthma flare  Patient Goals/Self-Care Activities: Take all medications as prescribed Attend all scheduled provider appointments Call pharmacy for medication refills 3-7 days in advance of running out of medications Perform all self care activities independently  Perform IADL's (shopping, preparing meals, housekeeping, managing finances) independently Call provider office for new concerns or questions   Follow Up Plan:  Telephone follow up appointment with care management team member scheduled for:  03/10/21       Plan:Telephone follow up appointment with care management team member scheduled for:  03/10/21  Barb Merino, RN, BSN, CCM Care Management Coordinator [facility]  Direct Phone: [phone]

## 2021-01-09 NOTE — Patient Instructions (Signed)
Visit Information   Thank you for taking time to visit with me today. Please don't hesitate to contact me if I can be of assistance to you before our next scheduled telephone appointment.  Following are the goals we discussed today:  (Copy and paste patient goals from clinical care plan here)  Our next appointment is by telephone on 03/10/21 at 10:20 AM  Please call the care guide team at [phone] if you need to cancel or reschedule your appointment.   Following is a copy of your full care plan:    Care Plan : [place] of Care  Updates made by Brandin Dilday, [name], RN since 01/09/2021 12:00 AM     Problem: No plan of care established for management of chronic disease states (Asthma, chronic pain, personal history of stomach cancer, partial Seizure, Vitamin D deficiency, Tobacco Use)   Priority: High     Long-Range Goal: Development of plan of care for chronic disease managment for Asthma, chronic pain, personal history of stomach cancer, partial Seizure, Vitamin D deficiency, Tobacco Use   This Visit's Progress: On track  Priority: High  Note:   Current Barriers:   Knowledge Deficits related to plan of care for management of Asthma, chronic pain, personal history of stomach cancer, partial Seizure, Vitamin D deficiency, Tobacco Use     Chronic Disease Management support and education needs related to Asthma, chronic pain, personal history of stomach cancer, partial Seizure, Vitamin D deficiency, Tobacco Use      RNCM Clinical Goal(s):  Patient will verbalize basic understanding of  Asthma, chronic pain, personal history of stomach cancer, partial Seizure, Vitamin D deficiency, Tobacco Use     disease process and self health management plan as evidenced by patient will experience no disease exacerbations related to his chronic disease states take all medications exactly as prescribed and will call provider for medication related questions as evidenced by patient will report  having no missed doses of prescribed medication attend all scheduled medical appointments: PCP follow up/Neurology follow up  as evidenced by patient will keep scheduled appointments  demonstrate Ongoing health management independence as evidenced by    through collaboration with RN Care manager, provider, and care team.   Interventions: 1:1 collaboration with primary care provider regarding development and update of comprehensive plan of care as evidenced by provider attestation and co-signature Inter-disciplinary care team collaboration (see longitudinal plan of care) Evaluation of current treatment plan related to  self management and patient's adherence to plan as established by provider  (Status:  Goal on track:  Yes.)  Long Term Goal Evaluation of current treatment plan related to  Seizure disorder , self-management and patient's adherence to plan as established by provider. Advised patient to record and report any/all Seizure activity Reviewed medications with patient and discussed indication, dosage and frequency of his prescribed medication; Reinforced the importance of medication adherence for best effectiveness Reviewed scheduled/upcoming provider appointments including: Neuro follow up with Dr. Delice Lesch scheduled for 08/04/20 _0 :00 PM  Discussed plans with patient for ongoing care management follow up and provided patient with direct contact information for care management team  (Status:  Goal on track:  Yes.)  Long Term Goal Evaluation of current treatment plan related to  Vitamin D deficiency ,  self-management and patient's adherence to plan as established by provider. Review of patient status, including review of consultant's reports, relevant laboratory and other test results, and medications completed. Reviewed medications with patient and discussed indication, dosage and frequency  of prescribed medication; reinforced importance of medication adherence for best effectiveness   Reviewed scheduled/upcoming provider appointments including: next PCP follow up scheduled for 05/04/21 _0 :40 PM Discussed plans with patient for ongoing care management follow up and provided patient with direct contact information for care management team Vit D, 25-Hydroxy 30.0 - 100.0 ng/mL 12.4 Low    04/21/20   (Status:  New goal.)  Long Term Goal Evaluation of current treatment plan related to Asthma and   self-management and patient's adherence to plan as established by provider. Discussed plans with patient for ongoing care management follow up and provided patient with direct contact information for care management team Provided education to patient re: potential triggers that may exacerbate Asthma such as being outdoors during colder temperatures Reviewed medications with patient and discussed patient is not currently prescribed to take any medications and or inhalers for Asthma Provided patient with printed educational materials related to Asthma Action Plan and how to avoid potential triggers for Asthma flare  Patient Goals/Self-Care Activities: Take all medications as prescribed Attend all scheduled provider appointments Call pharmacy for medication refills 3-7 days in advance of running out of medications Perform all self care activities independently  Perform IADL's (shopping, preparing meals, housekeeping, managing finances) independently Call provider office for new concerns or questions   Follow Up Plan:  Telephone follow up appointment with care management team member scheduled for:  03/10/21       Consent to CCM Services: [name] was given information about Chronic Care Management services including:  CCM service includes personalized support from designated clinical staff supervised by his physician, including individualized plan of care and coordination with other care providers 24/7 contact phone numbers for assistance for urgent and routine care needs. Service  will only be billed when office clinical staff spend 20 minutes or more in a month to coordinate care. Only one practitioner may furnish and bill the service in a calendar month. The patient may stop CCM services at any time (effective at the end of the month) by phone call to the office staff. The patient will be responsible for cost sharing (co-pay) of up to 20% of the service fee (after annual deductible is met).  Patient agreed to services and verbal consent obtained.   The patient verbalized understanding of instructions, educational materials, and care plan provided today and declined offer to receive copy of patient instructions, educational materials, and care plan.   Telephone follow up appointment with care management team member scheduled for: 03/10/21

## 2021-01-11 DIAGNOSIS — J45909 Unspecified asthma, uncomplicated: Secondary | ICD-10-CM

## 2021-02-01 ENCOUNTER — Telehealth: Payer: Self-pay

## 2021-02-27 ENCOUNTER — Telehealth: Payer: Self-pay

## 2021-02-27 NOTE — Chronic Care Management (AMB) (Signed)
° ° °  Chronic Care Management Pharmacy Assistant   Name: [name]  MRN: [id number] DOB: [birth year]   Reason for Encounter: Disease State/ General  Recent office visits:  10-07-2020 [name], RN (CCM)  01-09-2021 [name], [name], RN (CCM)  Recent consult visits:  None  Hospital visits:  None in previous 6 months  Medications: Outpatient Encounter Medications as of 02/27/2021  Medication Sig Note   atorvastatin (LIPITOR) 10 MG tablet Take 1 tablet by mouth at bedtime on MWF    folic acid (FOLVITE) 1 MG tablet TAKE 1 TABLET BY MOUTH EVERY DAY    levETIRAcetam (KEPPRA) 500 MG tablet Take 1 tablet (500 mg total) by mouth 2 (two) times daily.    Multiple Vitamin (MULTIVITAMIN WITH MINERALS) TABS tablet Take 1 tablet by mouth daily.    oxyCODONE (OXY IR/ROXICODONE) 5 MG immediate release tablet Take 1-2 tablets (5-10 mg total) by mouth every 6 (six) hours as needed.    polyvinyl alcohol (LIQUIFILM TEARS) 1.4 % ophthalmic solution Place 1 drop into both eyes as needed for dry eyes.    thiamine (VITAMIN B-1) 100 MG tablet TAKE 1 TABLET BY MOUTH EVERY DAY    Vitamin D, Ergocalciferol, (DRISDOL) 1.25 MG (50000 UNIT) CAPS capsule Take 1 capsule (50,000 Units total) by mouth 2 (two) times a week. 08/18/2020: Monday and Fridays   No facility-administered encounter medications on file as of 02/27/2021.   02-27-2021: 1st attempt left VM 03-01-2021: 2nd attempt left VM 03-02-2021: 3rd attempt left VM  Care Gaps: Shingrix overdue PNA Vac overdue 3rd Pfizer vaccine overdue Flu vaccine overdue AWV 05-04-2021  [facility] Rating Drugs: Atorvastatin 10 mg- Last filled 11-10-2020 [facility]  [name] Clinical Pharmacist Assistant [phone]

## 2021-03-10 ENCOUNTER — Telehealth: Payer: Medicare HMO

## 2021-03-10 ENCOUNTER — Ambulatory Visit (INDEPENDENT_AMBULATORY_CARE_PROVIDER_SITE_OTHER): Payer: Medicare Other

## 2021-03-10 DIAGNOSIS — G8929 Other chronic pain: Secondary | ICD-10-CM

## 2021-03-10 DIAGNOSIS — Z720 Tobacco use: Secondary | ICD-10-CM

## 2021-03-10 DIAGNOSIS — E559 Vitamin D deficiency, unspecified: Secondary | ICD-10-CM

## 2021-03-10 DIAGNOSIS — J45909 Unspecified asthma, uncomplicated: Secondary | ICD-10-CM

## 2021-03-10 DIAGNOSIS — R569 Unspecified convulsions: Secondary | ICD-10-CM

## 2021-03-10 NOTE — Chronic Care Management (AMB) (Signed)
Chronic Care Management   CCM RN Visit Note  03/10/2021 Name: [name] MRN: [id number] DOB: Nov 16, 1970  Subjective: [name] is a 51 y.o. year old male who is a primary care patient of [name], [place]. The care management team was consulted for assistance with disease management and care coordination needs.    Engaged with patient by telephone for follow up visit in response to provider referral for case management and/or care coordination services.   Consent to Services:  The patient was given information about Chronic Care Management services, agreed to services, and gave verbal consent prior to initiation of services.  Please see initial visit note for detailed documentation.   Patient agreed to services and verbal consent obtained.   Assessment: Review of patient past medical history, allergies, medications, health status, including review of consultants reports, laboratory and other test data, was performed as part of comprehensive evaluation and provision of chronic care management services.   SDOH (Social Determinants of Health) assessments and interventions performed:  yes, no acute changes   CCM Care Plan  Allergies  Allergen Reactions   Aspirin Anaphylaxis   Ibuprofen Anaphylaxis   Hydrocodone Hives and Itching   Tylenol [Acetaminophen]     Upset stomach    Outpatient Encounter Medications as of 03/10/2021  Medication Sig Note   atorvastatin (LIPITOR) 10 MG tablet Take 1 tablet by mouth at bedtime on MWF    folic acid (FOLVITE) 1 MG tablet TAKE 1 TABLET BY MOUTH EVERY DAY    levETIRAcetam (KEPPRA) 500 MG tablet Take 1 tablet (500 mg total) by mouth 2 (two) times daily.    Multiple Vitamin (MULTIVITAMIN WITH MINERALS) TABS tablet Take 1 tablet by mouth daily.    polyvinyl alcohol (LIQUIFILM TEARS) 1.4 % ophthalmic solution Place 1 drop into both eyes as needed for dry eyes.    thiamine (VITAMIN B-1) 100 MG tablet TAKE 1 TABLET BY MOUTH EVERY DAY     Vitamin D, Ergocalciferol, (DRISDOL) 1.25 MG (50000 UNIT) CAPS capsule Take 1 capsule (50,000 Units total) by mouth 2 (two) times a week. 08/18/2020: Monday and Fridays   oxyCODONE (OXY IR/ROXICODONE) 5 MG immediate release tablet Take 1-2 tablets (5-10 mg total) by mouth every 6 (six) hours as needed. (Patient not taking: Reported on 03/10/2021)    No facility-administered encounter medications on file as of 03/10/2021.    Patient Active Problem List   Diagnosis Date Noted   Personal history of stomach cancer 04/19/2019   Gastric cancer s/p distal gastrectomy 03/25/2019 03/25/2019   Personal history of colonic polyps 01/29/2019   History of pancreatitis 11/26/2017   Hyponatremia    Partial seizure ([place]) 03/05/2015   History of seizures 03/05/2015   Abdominal pain, chronic, epigastric 07/02/2014   ABDOMINAL PAIN, GENERALIZED 03/07/2010   KNEE PAIN, RIGHT 11/18/2009   HYPERGLYCEMIA 09/08/2009   ANKLE SPRAIN, LEFT 02/05/2009   BACK STRAIN, LUMBAR 02/05/2009   POLYURIA 10/28/2008   ABDOMINAL PAIN, EPIGASTRIC 10/28/2008   INSOMNIA, CHRONIC 10/10/2007   DEPRESSION, CHRONIC 10/10/2007   Asthma 10/10/2007   WOUND INFECTION 10/10/2007   History of alcohol abuse 04/15/2007   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   PANCREATITIS, CHRONIC 04/15/2007   History of amputation of finger, right 04/16/2003    Conditions to be addressed/monitored: Asthma, chronic pain, personal history of stomach cancer, partial Seizure, Vitamin D deficiency, Tobacco Use  Care Plan : RN Care Manager Plan of Care  Updates made by [name], RN since 03/10/2021 12:00 AM  Problem: No plan of care established for management of chronic disease states (Asthma, chronic pain, personal history of stomach cancer, partial Seizure, Vitamin D deficiency, Tobacco Use)   Priority: High     Long-Range Goal: Development of plan of care for chronic disease managment for Asthma, chronic pain, personal history of stomach cancer, partial  Seizure, Vitamin D deficiency, Tobacco Use   Recent Progress: On track  Priority: High  Note:   Current Barriers:   Knowledge Deficits related to plan of care for management of Asthma, chronic pain, personal history of stomach cancer, partial Seizure, Vitamin D deficiency, Tobacco Use     Chronic Disease Management support and education needs related to Asthma, chronic pain, personal history of stomach cancer, partial Seizure, Vitamin D deficiency, Tobacco Use      RNCM Clinical Goal(s):  Patient will verbalize basic understanding of  Asthma, chronic pain, personal history of stomach cancer, partial Seizure, Vitamin D deficiency, Tobacco Use     disease process and self health management plan as evidenced by patient will experience no disease exacerbations related to his chronic disease states take all medications exactly as prescribed and will call provider for medication related questions as evidenced by patient will report having no missed doses of prescribed medication attend all scheduled medical appointments: PCP follow up/Neurology follow up  as evidenced by patient will keep scheduled appointments  demonstrate Ongoing health management independence as evidenced by    through collaboration with RN Care manager, provider, and care team.   Interventions: 1:1 collaboration with primary care provider regarding development and update of comprehensive plan of care as evidenced by provider attestation and co-signature Inter-disciplinary care team collaboration (see longitudinal plan of care) Evaluation of current treatment plan related to  self management and patient's adherence to plan as established by provider  Seizure Disorder Intervention: (Status:  Goal on track:  Yes.)  Long Term Goal Evaluation of current treatment plan related to  Seizure disorder , self-management and patient's adherence to plan as established by provider. Assessed for Seizure activity since last RN CM contact, patient  denies having any Seizure activity  Reviewed medications with patient and discussed indication, dosage and frequency of his prescribed medication; Reinforced the importance of medication adherence for best effectiveness Reviewed scheduled/upcoming provider appointments including: Neuro follow up with Dr. Delice Lesch scheduled for 08/04/20 @4 :00 PM  Discussed plans with patient for ongoing care management follow up and provided patient with direct contact information for care management team  Vitamin D deficiency Intervention: (Status:  Goal on track:  Yes.)  Long Term Goal Evaluation of current treatment plan related to  Vitamin D deficiency ,  self-management and patient's adherence to plan as established by provider Review of patient status, including review of consultant's reports, relevant laboratory and other test results, and medications completed Educated on target Vitamin D level for optimal health (60-80) Reviewed medications with patient and discussed indication, dosage and frequency of prescribed medication; reinforced importance of medication adherence for best effectiveness  Reviewed scheduled/upcoming provider appointments including: next PCP follow up scheduled for 05/04/21 @2 :40 PM Discussed plans with patient for ongoing care management follow up and provided patient with direct contact information for care management team Component Ref Range & Units 10 mo ago  Vit D, 25-Hydroxy 30.0 - 100.0 ng/mL 12.4 Low   Asthma Intervention: (Status:  Goal on track:  Yes.)  Long Term Goal Evaluation of current treatment plan related to Asthma and   self-management and patient's adherence to plan as  established by provider Provided education to patient re: potential triggers that may exacerbate Asthma such as being outdoors during colder temperatures Reviewed medications with patient and discussed patient is not currently prescribed to take any medications and or inhalers for Asthma Discussed plans  with patient for ongoing care management follow up and provided patient with direct contact information for care management team  Patient Goals/Self-Care Activities: Take all medications as prescribed Attend all scheduled provider appointments Call pharmacy for medication refills 3-7 days in advance of running out of medications Perform all self care activities independently  Perform IADL's (shopping, preparing meals, housekeeping, managing finances) independently Call provider office for new concerns or questions   Follow Up Plan:  Telephone follow up appointment with care management team member scheduled for:  04/2721       Plan:Telephone follow up appointment with care management team member scheduled for:  05/08/21  Barb Merino, RN, BSN, CCM Care Management Coordinator [place] [facility]  Direct Phone: [phone]

## 2021-03-10 NOTE — Patient Instructions (Signed)
Visit Information  Thank you for taking time to visit with me today. Please don't hesitate to contact me if I can be of assistance to you before our next scheduled telephone appointment.  Following are the goals we discussed today:  (Copy and paste patient goals from clinical care plan here)  Our next appointment is by telephone on 05/08/21 at 9:30 AM  Please call the care guide team at [phone] if you need to cancel or reschedule your appointment.   If you are experiencing a Mental Health or [place] or need someone to talk to, please call 1-800-273-TALK (toll free, 24 hour hotline)   The patient verbalized understanding of instructions, educational materials, and care plan provided today and agreed to receive a mailed copy of patient instructions, educational materials, and care plan.   Barb Merino, RN, BSN, CCM Care Management Coordinator [place] [facility]  Direct Phone: [phone]

## 2021-03-14 DIAGNOSIS — J45909 Unspecified asthma, uncomplicated: Secondary | ICD-10-CM

## 2021-05-04 ENCOUNTER — Ambulatory Visit: Payer: Medicare HMO | Admitting: Nurse Practitioner

## 2021-05-04 ENCOUNTER — Ambulatory Visit: Payer: Medicare HMO

## 2021-05-08 ENCOUNTER — Telehealth: Payer: Medicare Other

## 2021-05-08 ENCOUNTER — Other Ambulatory Visit: Payer: Self-pay

## 2021-05-08 MED ORDER — LEVETIRACETAM 500 MG PO TABS: 500.0000 mg | ORAL_TABLET | Freq: Two times a day (BID) | ORAL | 0 refills | Status: DC

## 2021-05-16 ENCOUNTER — Ambulatory Visit: Payer: Medicare Other | Admitting: Nurse Practitioner

## 2021-05-16 ENCOUNTER — Encounter: Payer: Self-pay | Admitting: Nurse Practitioner

## 2021-05-16 ENCOUNTER — Ambulatory Visit (INDEPENDENT_AMBULATORY_CARE_PROVIDER_SITE_OTHER): Payer: Medicare Other | Admitting: Nurse Practitioner

## 2021-05-16 VITALS — BP 100/60 | HR 85 | Temp 98.1°F | Ht 69.0 in | Wt 166.0 lb

## 2021-05-16 DIAGNOSIS — I700 Atherosclerosis of aorta: Secondary | ICD-10-CM

## 2021-05-16 DIAGNOSIS — E559 Vitamin D deficiency, unspecified: Secondary | ICD-10-CM | POA: Diagnosis not present

## 2021-05-16 DIAGNOSIS — R569 Unspecified convulsions: Secondary | ICD-10-CM | POA: Diagnosis not present

## 2021-05-16 NOTE — Patient Instructions (Signed)
Seizure, Adult ?A seizure is a sudden burst of abnormal electrical and chemical activity in the brain. Seizures usually last from 30 seconds to 2 minutes.  ?What are the causes? ?Common causes of this condition include: ?Fever or infection. ?Problems that affect the brain. These may include: ?A brain or head injury. ?Bleeding in the brain. ?A brain tumor. ?Low levels of blood sugar or salt. ?Kidney problems or liver problems. ?Conditions that are passed from parent to child (are inherited). ?Problems with a substance, such as: ?Having a reaction to a drug or a medicine. ?Stopping the use of a substance all of a sudden (withdrawal). ?A stroke. ?Disorders that affect how you develop. ?Sometimes, the cause may not be known.  ?What increases the risk? ?Having someone in your family who has epilepsy. In this condition, seizures happen again and again over time. They have no clear cause. ?Having had a tonic-clonic seizure before. This type of seizure causes you to: ?Tighten the muscles of the whole body. ?Lose consciousness. ?Having had a head injury or strokes before. ?Having had a lack of oxygen at birth. ?What are the signs or symptoms? ?There are many types of seizures. The symptoms vary depending on the type of seizure you have. ?Symptoms during a seizure ?Shaking that you cannot control (convulsions) with fast, jerky movements of muscles. ?Stiffness of the body. ?Breathing problems. ?Feeling mixed up (confused). ?Staring or not responding to sound or touch. ?Head nodding. ?Eyes that blink, flutter, or move fast. ?Drooling, grunting, or making clicking sounds with your mouth ?Losing control of when you pee or poop. ?Symptoms before a seizure ?Feeling afraid, nervous, or worried. ?Feeling like you may vomit. ?Feeling like: ?You are moving when you are not. ?Things around you are moving when they are not. ?Feeling like you saw or heard something before (d?j? vu). ?Odd tastes or smells. ?Changes in how you see. You may  see flashing lights or spots. ?Symptoms after a seizure ?Feeling confused. ?Feeling sleepy. ?Headache. ?Sore muscles. ?How is this treated? ?If your seizure stops on its own, you will not need treatment. If your seizure lasts longer than 5 minutes, you will normally need treatment. Treatment may include: ?Medicines given through an IV tube. ?Avoiding things, such as medicines, that are known to cause your seizures. ?Medicines to prevent seizures. ?A device to prevent or control seizures. ?Surgery. ?A diet low in carbohydrates and high in fat (ketogenic diet). ?Follow these instructions at home: ?Medicines ?Take over-the-counter and prescription medicines only as told by your doctor. ?Avoid foods or drinks that may keep your medicine from working, such as alcohol. ?Activity ?Follow instructions about driving, swimming, or doing things that would be dangerous if you had another seizure. Wait until your doctor says it is safe for you to do these things. ?If you live in the [place] ask your local department of motor vehicles when you can drive. ?Get a lot of rest. ?Teaching others ? ?Teach friends and family what to do when you have a seizure. They should: ?Help you get down to the ground. ?Protect your head and body. ?Loosen any clothing around your neck. ?Turn you on your side. ?Know whether or not you need emergency care. ?Stay with you until you are better. ?Also, tell them what not to do if you have a seizure. Tell them: ?They should not hold you down. ?They should not put anything in your mouth. ?General instructions ?Avoid anything that gives you seizures. ?Keep a seizure diary. Write down: ?What you remember  about each seizure. ?What you think caused each seizure. ?Keep all follow-up visits. ?Contact a doctor if: ?You have another seizure or seizures. Call the doctor each time you have a seizure. ?The pattern of your seizures changes. ?You keep having seizures with treatment. ?You have symptoms of being sick or  having an infection. ?You are not able to take your medicine. ?Get help right away if: ?You have any of these problems: ?A seizure that lasts longer than 5 minutes. ?Many seizures in a row and you do not feel better between seizures. ?A seizure that makes it harder to breathe. ?A seizure and you can no longer speak or use part of your body. ?You do not wake up right after a seizure. ?You get hurt during a seizure. ?You feel confused or have pain right after a seizure. ?These symptoms may be an emergency. Get help right away. Call your local emergency services (911 in the [place] ?Do not wait to see if the symptoms will go away. ?Do not drive yourself to the hospital. ?Summary ?A seizure is a sudden burst of abnormal electrical and chemical activity in the brain. Seizures normally last from 30 seconds to 2 minutes. ?Causes of seizures include illness, injury to the head, low levels of blood sugar or salt, and certain conditions. ?Most seizures will stop on their own in less than 5 minutes. Seizures that last longer than 5 minutes are a medical emergency and need treatment right away. ?Many medicines are used to treat seizures. Take over-the-counter and prescription medicines only as told by your doctor. ?This information is not intended to replace advice given to you by your health care provider. Make sure you discuss any questions you have with your health care provider. ?Document Revised: 08/07/2019 Document Reviewed: 08/07/2019 ?Elsevier Patient Education ? [place]. ? ?

## 2021-05-16 NOTE — Progress Notes (Signed)
?Industrial/product designer as a Education administrator for [employer], FNP.,have documented all relevant documentation on the behalf of [name], FNP,as directed by  [name], FNP while in the presence of [name], [place]. ? ?This visit occurred during the SARS-CoV-2 public health emergency.  Safety protocols were in place, including screening questions prior to the visit, additional usage of staff PPE, and extensive cleaning of exam room while observing appropriate contact time as indicated for disinfecting solutions. ? ?Subjective:  ?  ? Patient ID: [name] , male    DOB: November 13, 1970 , 51 y.o.   MRN: [id number] ? ? ?Chief Complaint  ?Patient presents with  ? Seizures  ? ? ?HPI ? ?Patient presents for seizure follow up. He states that he had a seizure last Thursday evening. He had not been taking his seizure medication then he restarted last week then had a seizure. He stopped taking everything. He states that he is under  a lot of stress right now. He has not seen his neurologist recently ? ?  ? ?Past Medical History:  ?Diagnosis Date  ? Abdominal pain   ? intermittent  ? Allergy   ? SEASONAL  ? Anemia   ? Aneurysm ([place])   ? brain  ? Asthma   ? Gastric cancer ([place])   ? GERD (gastroesophageal reflux disease)   ? Pancreatitis   ? PTSD (post-traumatic stress disorder) 2008  ? Rectal polyp 01/09/2006  ? Hyperplastic  ? Seizures ([place])   ? ADMITS TAKING MEDICATION FOR SEIZURE D/T ANEURYSM ,DO NOT KNOW IF HAD SEIZURE. 11/10/18  ?  ? ?Family History  ?Problem Relation Age of Onset  ? Stroke Mother   ? Hypertension Mother   ? Diabetes Father   ? Pancreatic cancer Maternal Uncle   ? Colon cancer Maternal Uncle   ? Cancer Other   ? Diabetes Other   ? Colon cancer Maternal Grandfather   ? Colon cancer Paternal Grandfather   ? Pancreatic cancer Cousin   ? Esophageal cancer Neg Hx   ? Rectal cancer Neg Hx   ? Stomach cancer Neg Hx   ? ? ? ?Current Outpatient Medications:  ?  folic acid (FOLVITE) 1 MG tablet, TAKE 1 TABLET BY  MOUTH EVERY DAY, Disp: 90 tablet, Rfl: 0 ?  levETIRAcetam (KEPPRA) 500 MG tablet, Take 1 tablet (500 mg total) by mouth 2 (two) times daily., Disp: 60 tablet, Rfl: 0 ?  Multiple Vitamin (MULTIVITAMIN WITH MINERALS) TABS tablet, Take 1 tablet by mouth daily., Disp:  , Rfl:  ?  polyvinyl alcohol (LIQUIFILM TEARS) 1.4 % ophthalmic solution, Place 1 drop into both eyes as needed for dry eyes., Disp: , Rfl:  ?  thiamine (VITAMIN B-1) 100 MG tablet, TAKE 1 TABLET BY MOUTH EVERY DAY, Disp: 100 tablet, Rfl: 1 ?  Vitamin D, Ergocalciferol, (DRISDOL) 1.25 MG (50000 UNIT) CAPS capsule, Take 1 capsule (50,000 Units total) by mouth 2 (two) times a week., Disp: 24 capsule, Rfl: 1 ?  atorvastatin (LIPITOR) 10 MG tablet, Take 1 tablet by mouth at bedtime on MWF, Disp: 45 tablet, Rfl: 1  ? ?Allergies  ?Allergen Reactions  ? Aspirin Anaphylaxis  ? Ibuprofen Anaphylaxis  ? Hydrocodone Hives and Itching  ? Tylenol [Acetaminophen]   ?  Upset stomach  ?  ? ?Review of Systems  ?Constitutional: Negative.   ?Respiratory: Negative.    ?Cardiovascular: Negative.   ?Gastrointestinal: Negative.   ?Musculoskeletal: Negative.   ?Neurological: Negative.   ?Psychiatric/Behavioral: Negative.     ? ?  Today's Vitals  ? 05/16/21 1504  ?BP: 100/60  ?Pulse: 85  ?Temp: 98.1 ?F (36.7 ?C)  ?TempSrc: Oral  ?Weight: 166 lb (75.3 kg)  ?Height: 5' 9"  (1.753 m)  ? ?Body mass index is 24.51 kg/m?.  ?Wt Readings from Last 3 Encounters:  ?05/16/21 166 lb (75.3 kg)  ?09/01/20 156 lb (70.8 kg)  ?08/10/20 146 lb 12.8 oz (66.6 kg)  ? ? ?Objective:  ?Physical Exam ?Vitals reviewed.  ?Constitutional:   ?   General: He is not in acute distress. ?   Appearance: Normal appearance.  ?Cardiovascular:  ?   Rate and Rhythm: Normal rate and regular rhythm.  ?   Pulses: Normal pulses.  ?   Heart sounds: Normal heart sounds. No murmur heard. ?Pulmonary:  ?   Effort: Pulmonary effort is normal. No respiratory distress.  ?   Breath sounds: Normal breath sounds. No wheezing.   ?Musculoskeletal:     ?   General: Normal range of motion.  ?   Cervical back: Normal range of motion and neck supple.  ?Skin: ?   General: Skin is warm and dry.  ?   Capillary Refill: Capillary refill takes less than 2 seconds.  ?Neurological:  ?   General: No focal deficit present.  ?   Mental Status: He is alert and oriented to person, place, and time.  ?   Cranial Nerves: No cranial nerve deficit.  ?   Motor: No weakness.  ?Psychiatric:     ?   Mood and Affect: Mood normal.     ?   Behavior: Behavior normal.     ?   Thought Content: Thought content normal.     ?   Judgment: Judgment normal.  ?  ? ?   ?Assessment And Plan:  ?   ?1. Seizure ([place]) ?Comments: Most recent on Thursday I explained to him generally when he has a seizure he should not be driving. Advised to take medications regularly and f/u with Neuro ?- Levetiracetam level ? ?2. Atherosclerosis of aorta ([place]) ?Comments: He is to be taking a statin, tolerating well.  ?- atorvastatin (LIPITOR) 10 MG tablet; Take 1 tablet by mouth at bedtime on MWF  Dispense: 45 tablet; Refill: 1 ? ?3. Vitamin D deficiency ?Will check vitamin D level and supplement as needed.    ?Also encouraged to spend 15 minutes in the sun daily.  ?- VITAMIN D 25 Hydroxy (Vit-D Deficiency, Fractures) ?- BMP8+EGFR ?  ? ? ?Patient was given opportunity to ask questions. Patient verbalized understanding of the plan and was able to repeat key elements of the plan. All questions were answered to their satisfaction.  ?[name], FNP  ? ?I, [name], FNP, have reviewed all documentation for this visit. The documentation on 05/16/21 for the exam, diagnosis, procedures, and orders are all accurate and complete.  ? ?IF YOU HAVE BEEN REFERRED TO A SPECIALIST, IT MAY TAKE 1-2 WEEKS TO SCHEDULE/PROCESS THE REFERRAL. IF YOU HAVE NOT HEARD FROM US/SPECIALIST IN TWO WEEKS, PLEASE GIVE [place] A CALL AT [phone].  ? ?THE PATIENT IS ENCOURAGED TO PRACTICE SOCIAL DISTANCING DUE TO THE  COVID-19 PANDEMIC.   ?

## 2021-05-17 LAB — BMP8+EGFR
BUN/Creatinine Ratio: 12 (ref 9–20)
BUN: 11 mg/dL (ref 6–24)
CO2: 20 mmol/L (ref 20–29)
Calcium: 8.5 mg/dL — ABNORMAL LOW (ref 8.7–10.2)
Chloride: 95 mmol/L — ABNORMAL LOW (ref 96–106)
Creatinine, Ser: 0.91 mg/dL (ref 0.76–1.27)
Glucose: 63 mg/dL — ABNORMAL LOW (ref 70–99)
Potassium: 4 mmol/L (ref 3.5–5.2)
Sodium: 130 mmol/L — ABNORMAL LOW (ref 134–144)
eGFR: 101 mL/min/{1.73_m2} (ref >59)

## 2021-05-17 LAB — LEVETIRACETAM LEVEL: Levetiracetam Lvl: <2 ug/mL — ABNORMAL LOW (ref 10.0–40.0)

## 2021-05-17 LAB — VITAMIN D 25 HYDROXY (VIT D DEFICIENCY, FRACTURES): Vit D, 25-Hydroxy: 54.2 ng/mL (ref 30.0–100.0)

## 2021-05-18 ENCOUNTER — Telehealth: Payer: Self-pay

## 2021-05-18 NOTE — Telephone Encounter (Signed)
-----   Message from Pieter Partridge, DO sent at 05/18/2021  9:24 AM EDT ----- ?Labs indicate that the patient has not been taking his Keppra.  Recommend restarting it immediately ?

## 2021-05-18 NOTE — Telephone Encounter (Signed)
Pt called an informed Labs indicate that the patient has not been taking his Keppra.  Recommend restarting it immediately. Pt verbalized understanding  ?

## 2021-06-04 MED ORDER — ATORVASTATIN CALCIUM 10 MG PO TABS: ORAL_TABLET | ORAL | 1 refills | Status: DC

## 2021-06-07 ENCOUNTER — Ambulatory Visit: Payer: Medicare Other | Admitting: Nurse Practitioner

## 2021-06-07 ENCOUNTER — Ambulatory Visit (INDEPENDENT_AMBULATORY_CARE_PROVIDER_SITE_OTHER): Payer: Medicare Other

## 2021-06-07 VITALS — BP 108/62 | HR 73 | Temp 98.1°F | Ht 72.8 in | Wt 161.8 lb

## 2021-06-07 DIAGNOSIS — Z0000 Encounter for general adult medical examination without abnormal findings: Secondary | ICD-10-CM

## 2021-06-07 NOTE — Patient Instructions (Signed)
Mr. Grand , ?Thank you for taking time to come for your Medicare Wellness Visit. I appreciate your ongoing commitment to your health goals. Please review the following plan we discussed and let me know if I can assist you in the future.  ? ?Screening recommendations/referrals: ?Colonoscopy: completed 11/24/2018, due 11/23/2021 ?Recommended yearly ophthalmology/optometry visit for glaucoma screening and checkup ?Recommended yearly dental visit for hygiene and checkup ? ?Vaccinations: ?Influenza vaccine: due 09/12/2021 ?Pneumococcal vaccine: n/a ?Tdap vaccine: completed 04/05/2018, due 04/05/2028 ?Shingles vaccine: discussed   ?Covid-19: 05/26/2020, 06/15/2019 ? ?Advanced directives: Advance directive discussed with you today. Even though you declined this today please call our office should you change your mind and we can give you the proper paperwork for you to fill out. ? ?Conditions/risks identified: none ? ?Next appointment: Follow up in one year for your annual wellness visit  ? ?Preventive Care 40-64 Years, Male ?Preventive care refers to lifestyle choices and visits with your health care provider that can promote health and wellness. ?What does preventive care include? ?A yearly physical exam. This is also called an annual well check. ?Dental exams once or twice a year. ?Routine eye exams. Ask your health care provider how often you should have your eyes checked. ?Personal lifestyle choices, including: ?Daily care of your teeth and gums. ?Regular physical activity. ?Eating a healthy diet. ?Avoiding tobacco and drug use. ?Limiting alcohol use. ?Practicing safe sex. ?Taking low-dose aspirin every day starting at age 77. ?What happens during an annual well check? ?The services and screenings done by your health care provider during your annual well check will depend on your age, overall health, lifestyle risk factors, and family history of disease. ?Counseling  ?Your health care provider may ask you questions about  your: ?Alcohol use. ?Tobacco use. ?Drug use. ?Emotional well-being. ?Home and relationship well-being. ?Sexual activity. ?Eating habits. ?Work and work Statistician. ?Screening  ?You may have the following tests or measurements: ?Height, weight, and BMI. ?Blood pressure. ?Lipid and cholesterol levels. These may be checked every 5 years, or more frequently if you are over 26 years old. ?Skin check. ?Lung cancer screening. You may have this screening every year starting at age 42 if you have a 30-pack-year history of smoking and currently smoke or have quit within the past 15 years. ?Fecal occult blood test (FOBT) of the stool. You may have this test every year starting at age 65. ?Flexible sigmoidoscopy or colonoscopy. You may have a sigmoidoscopy every 5 years or a colonoscopy every 10 years starting at age 42. ?Prostate cancer screening. Recommendations will vary depending on your family history and other risks. ?Hepatitis C blood test. ?Hepatitis B blood test. ?Sexually transmitted disease (STD) testing. ?Diabetes screening. This is done by checking your blood sugar (glucose) after you have not eaten for a while (fasting). You may have this done every 1-3 years. ?Discuss your test results, treatment options, and if necessary, the need for more tests with your health care provider. ?Vaccines  ?Your health care provider may recommend certain vaccines, such as: ?Influenza vaccine. This is recommended every year. ?Tetanus, diphtheria, and acellular pertussis (Tdap, Td) vaccine. You may need a Td booster every 10 years. ?Zoster vaccine. You may need this after age 70. ?Pneumococcal 13-valent conjugate (PCV13) vaccine. You may need this if you have certain conditions and have not been vaccinated. ?Pneumococcal polysaccharide (PPSV23) vaccine. You may need one or two doses if you smoke cigarettes or if you have certain conditions. ?Talk to your health care provider about which  screenings and vaccines you need and how  often you need them. ?This information is not intended to replace advice given to you by your health care provider. Make sure you discuss any questions you have with your health care provider. ?Document Released: 02/25/2015 Document Revised: 10/19/2015 Document Reviewed: 11/30/2014 ?Elsevier Interactive Patient Education ? 2017 [place]. ? ?Fall Prevention in the Home ?Falls can cause injuries. They can happen to people of all ages. There are many things you can do to make your home safe and to help prevent falls. ?What can I do on the outside of my home? ?Regularly fix the edges of walkways and driveways and fix any cracks. ?Remove anything that might make you trip as you walk through a door, such as a raised step or threshold. ?Trim any bushes or trees on the path to your home. ?Use bright outdoor lighting. ?Clear any walking paths of anything that might make someone trip, such as rocks or tools. ?Regularly check to see if handrails are loose or broken. Make sure that both sides of any steps have handrails. ?Any raised decks and porches should have guardrails on the edges. ?Have any leaves, snow, or ice cleared regularly. ?Use sand or salt on walking paths during winter. ?Clean up any spills in your garage right away. This includes oil or grease spills. ?What can I do in the bathroom? ?Use night lights. ?Install grab bars by the toilet and in the tub and shower. Do not use towel bars as grab bars. ?Use non-skid mats or decals in the tub or shower. ?If you need to sit down in the shower, use a plastic, non-slip stool. ?Keep the floor dry. Clean up any water that spills on the floor as soon as it happens. ?Remove soap buildup in the tub or shower regularly. ?Attach bath mats securely with double-sided non-slip rug tape. ?Do not have throw rugs and other things on the floor that can make you trip. ?What can I do in the bedroom? ?Use night lights. ?Make sure that you have a light by your bed that is easy to  reach. ?Do not use any sheets or blankets that are too big for your bed. They should not hang down onto the floor. ?Have a firm chair that has side arms. You can use this for support while you get dressed. ?Do not have throw rugs and other things on the floor that can make you trip. ?What can I do in the kitchen? ?Clean up any spills right away. ?Avoid walking on wet floors. ?Keep items that you use a lot in easy-to-reach places. ?If you need to reach something above you, use a strong step stool that has a grab bar. ?Keep electrical cords out of the way. ?Do not use floor polish or wax that makes floors slippery. If you must use wax, use non-skid floor wax. ?Do not have throw rugs and other things on the floor that can make you trip. ?What can I do with my stairs? ?Do not leave any items on the stairs. ?Make sure that there are handrails on both sides of the stairs and use them. Fix handrails that are broken or loose. Make sure that handrails are as long as the stairways. ?Check any carpeting to make sure that it is firmly attached to the stairs. Fix any carpet that is loose or worn. ?Avoid having throw rugs at the top or bottom of the stairs. If you do have throw rugs, attach them to the floor with carpet  tape. ?Make sure that you have a light switch at the top of the stairs and the bottom of the stairs. If you do not have them, ask someone to add them for you. ?What else can I do to help prevent falls? ?Wear shoes that: ?Do not have high heels. ?Have rubber bottoms. ?Are comfortable and fit you well. ?Are closed at the toe. Do not wear sandals. ?If you use a stepladder: ?Make sure that it is fully opened. Do not climb a closed stepladder. ?Make sure that both sides of the stepladder are locked into place. ?Ask someone to hold it for you, if possible. ?Clearly mark and make sure that you can see: ?Any grab bars or handrails. ?First and last steps. ?Where the edge of each step is. ?Use tools that help you move  around (mobility aids) if they are needed. These include: ?Canes. ?Walkers. ?Scooters. ?Crutches. ?Turn on the lights when you go into a dark area. Replace any light bulbs as soon as they burn out. ?Set up your furnit

## 2021-06-07 NOTE — Progress Notes (Signed)
?This visit occurred during the SARS-CoV-2 public health emergency.  Safety protocols were in place, including screening questions prior to the visit, additional usage of staff PPE, and extensive cleaning of exam room while observing appropriate contact time as indicated for disinfecting solutions. ? ?Subjective:  ? [name] is a 51 y.o. male who presents for Medicare Annual/Subsequent preventive examination. ? ?Review of Systems    ? ?Cardiac Risk Factors include: male gender ? ?   ?Objective:  ?  ?Today's Vitals  ? 06/07/21 1612  ?BP: 108/62  ?Pulse: 73  ?Temp: 98.1 ?F (36.7 ?C)  ?TempSrc: Oral  ?SpO2: 98%  ?Weight: 161 lb 12.8 oz (73.4 kg)  ?Height: 6' 0.8" (1.849 m)  ? ?Body mass index is 21.46 kg/m?. ? ? ?  06/07/2021  ?  4:28 PM 08/02/2020  ?  3:14 PM 04/21/2020  ?  2:28 PM 08/03/2019  ?  2:21 PM 04/16/2019  ?  2:57 PM 03/25/2019  ?  5:50 PM 03/19/2019  ? 12:36 PM  ?Advanced Directives  ?Does Patient Have a Medical Advance Directive? Yes No No No No No No  ?Type of Paramedic of [place];Living will        ?Copy of [place] in Chart? No - copy requested        ?Would patient like information on creating a medical advance directive?   No - Patient declined  No - Patient declined No - Patient declined Yes (MAU/Ambulatory/Procedural Areas - Information given)  ? ? ?Current Medications (verified) ?Outpatient Encounter Medications as of 06/07/2021  ?Medication Sig  ? atorvastatin (LIPITOR) 10 MG tablet Take 1 tablet by mouth at bedtime on MWF  ? folic acid (FOLVITE) 1 MG tablet TAKE 1 TABLET BY MOUTH EVERY DAY  ? levETIRAcetam (KEPPRA) 500 MG tablet Take 1 tablet (500 mg total) by mouth 2 (two) times daily.  ? Multiple Vitamin (MULTIVITAMIN WITH MINERALS) TABS tablet Take 1 tablet by mouth daily.  ? polyvinyl alcohol (LIQUIFILM TEARS) 1.4 % ophthalmic solution Place 1 drop into both eyes as needed for dry eyes.  ? thiamine (VITAMIN B-1) 100 MG tablet TAKE 1 TABLET BY  MOUTH EVERY DAY  ? Vitamin D, Ergocalciferol, (DRISDOL) 1.25 MG (50000 UNIT) CAPS capsule Take 1 capsule (50,000 Units total) by mouth 2 (two) times a week.  ? ?No facility-administered encounter medications on file as of 06/07/2021.  ? ? ?Allergies (verified) ?Aspirin, Ibuprofen, Hydrocodone, and Tylenol [acetaminophen]  ? ?History: ?Past Medical History:  ?Diagnosis Date  ? Abdominal pain   ? intermittent  ? Allergy   ? SEASONAL  ? Anemia   ? 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 Asthma   ? Gastric cancer ([place])   ? GERD (gastroesophageal reflux disease)   ? Pancreatitis   ? PTSD (post-traumatic stress disorder) 2008  ? Rectal polyp 01/09/2006  ? Hyperplastic  ? Seizures ([place])   ? ADMITS TAKING MEDICATION FOR SEIZURE D/T ANEURYSM ,DO NOT KNOW IF HAD SEIZURE. 11/10/18  ? ?Past Surgical History:  ?Procedure Laterality Date  ? arm surgery Left   ? repair of tendons "work injury cut"-"pinky finger numb"  ? BURR HOLE Right 02/28/2015  ? Procedure: BURR HOLES FOR SUBDURAL HEMATOMA;  Surgeon: Newman Pies, MD;  Location: [facility];  Service: Neurosurgery;  Laterality: Right;  ? CEREBRAL ANEURYSM REPAIR    ? COLONOSCOPY  2016  ? colon polyps  ? ESOPHAGOGASTRODUODENOSCOPY (EGD) WITH PROPOFOL N/A 01/13/2015  ? Procedure: ESOPHAGOGASTRODUODENOSCOPY (  EGD) WITH PROPOFOL;  Surgeon: [name], MD;  Location: [facility];  Service: Endoscopy;  Laterality: N/A;  ? ESOPHAGOGASTRODUODENOSCOPY (EGD) WITH PROPOFOL N/A 03/19/2019  ? Procedure: ESOPHAGOGASTRODUODENOSCOPY (EGD) WITH PROPOFOL;  Surgeon: [name], MD;  Location: [facility];  Service: Endoscopy;  Laterality: N/A;  ? ESOPHAGOGASTRODUODENOSCOPY ENDOSCOPY    ? EUS N/A 03/19/2019  ? Procedure: UPPER ENDOSCOPIC ULTRASOUND (EUS) RADIAL;  Surgeon: [name], MD;  Location: [facility];  Service: Endoscopy;  Laterality: N/A;  ? HERNIA REPAIR    ? Umbilical  ? LAPAROSCOPIC GASTRECTOMY  03/25/2019  ? LAPAROSCOPY DIAGNOSTIC (N/A )  ? LAPAROSCOPY N/A 03/25/2019  ? Procedure:  LAPAROSCOPY DIAGNOSTIC;  Surgeon: [name], MD;  Location: [facility];  Service: General;  Laterality: N/A;  ? LYMPH NODE BIOPSY N/A 03/25/2019  ? Procedure: Gastric Lymph Node Biopsy;  Surgeon: [name], MD;  Location: [facility];  Service: General;  Laterality: N/A;  ? PARTIAL GASTRECTOMY N/A 03/25/2019  ? Procedure: DISTAL SUBTOTAL GASTRECTOMY;  Surgeon: [name], MD;  Location: [facility];  Service: General;  Laterality: N/A;  ? SUBMUCOSAL INJECTION  03/19/2019  ? Procedure: SUBMUCOSAL INJECTION;  Surgeon: [name], MD;  Location: [facility];  Service: Endoscopy;;  ? 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 UPPER GASTROINTESTINAL ENDOSCOPY    ? ?Family History  ?Problem Relation Age of Onset  ? Stroke Mother   ? Hypertension Mother   ? Diabetes Father   ? Pancreatic cancer Maternal Uncle   ? Colon cancer Maternal Uncle   ? Cancer Other   ? Diabetes Other   ? Colon cancer Maternal Grandfather   ? Colon cancer Paternal Grandfather   ? Pancreatic cancer Cousin   ? Esophageal cancer Neg Hx   ? Rectal cancer Neg Hx   ? Stomach cancer Neg Hx   ? ?Social History  ? ?Socioeconomic History  ? Marital status: Single  ?  Spouse name: Not on file  ? Number of children: Not on file  ? Years of education: Not on file  ? Highest education level: Not on file  ?Occupational History  ? Occupation: disable  ?Tobacco Use  ? Smoking status: Former  ?  Packs/day: 0.00  ?  Types: Cigars, Cigarettes  ?  Quit date: 03/2019  ?  Years since quitting: 2.2  ? Smokeless tobacco: Never  ? Tobacco comments:  ?  smokes a black and mild daily  ?Vaping Use  ? Vaping Use: Never used  ?Substance and Sexual Activity  ? Alcohol use: No  ? Drug use: Yes  ?  Types: Marijuana  ?  Comment: last use 3 days ago per per 08-07-19  ? Sexual activity: Yes  ?Other Topics Concern  ? Not on file  ?Social History Narrative  ? Pt is R handed  ? Lives in single story home with his nephew  ? High school graduate  ? 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 ?Social Determinants of Health  ? ?Financial Resource Strain: Low Risk   ? Difficulty of Paying Living Expenses: Not hard at all  ?Food Insecurity: No Food Insecurity  ? Worried About Charity fundraiser in the Last Year: Never true  ? Ran Out of Food in the Last Year: Never true  ?Transportation Needs: No Transportation Needs  ? Lack of Transportation (Medical): No  ? Lack of Transportation (Non-Medical): No  ?Physical Activity: Inactive  ?  Days of Exercise per Week: 0 days  ? Minutes of Exercise per Session: 0 min  ?Stress: Stress Concern Present  ? Feeling of Stress : To some extent  ?Social Connections: Not on file  ? ? ?Tobacco Counseling ?Counseling given: Not Answered ?Tobacco comments: smokes a black and mild daily ? ? ?Clinical Intake: ? ?Pre-visit preparation completed: Yes ? ?Pain : 0-10 ?Pain Type: Acute pain ?Pain Location: Teeth ?Pain Descriptors / Indicators: Aching, Throbbing ?Pain Onset: Yesterday ?Pain Frequency: Constant ? ?  ? ?Nutritional Status: BMI of 19-24  Normal ?Nutritional Risks: None ?Diabetes: No ? ?How often do you need to have someone help you when you read instructions, pamphlets, or other written materials from your doctor or pharmacy?: 1 - Never ?What is the last grade level you completed in school?: 12th grade ? ?Diabetic?no  ? ?Interpreter Needed?: No ? ?Information entered by :: NAllen LPN ? ? ?Activities of Daily Living ? ?  06/07/2021  ?  4:31 PM  ?In your present state of health, do you have any difficulty performing the following activities:  ?Hearing? 0  ?Vision? 1  ?Difficulty concentrating or making decisions? 0  ?Walking or climbing stairs? 0  ?Dressing or bathing? 0  ?Doing errands, shopping? 0  ?Preparing Food and eating ? N  ?Using the Toilet? N  ?In the past six months, have you accidently leaked urine? N  ?Do you have problems with loss of bowel control? N  ?Managing your Medications? N  ?Managing your Finances? N   ?Housekeeping or managing your Housekeeping? N  ? ? ?Patient Care Team: ?[name], FNP as PCP - General (General Practice) ?Little, [name], RN as Case Manager ?Cameron Sprang, MD as Consulting Phy

## 2021-06-09 ENCOUNTER — Ambulatory Visit (INDEPENDENT_AMBULATORY_CARE_PROVIDER_SITE_OTHER): Payer: Medicare Other

## 2021-06-09 ENCOUNTER — Telehealth: Payer: Medicare Other

## 2021-06-09 DIAGNOSIS — E559 Vitamin D deficiency, unspecified: Secondary | ICD-10-CM

## 2021-06-09 DIAGNOSIS — Z0000 Encounter for general adult medical examination without abnormal findings: Secondary | ICD-10-CM

## 2021-06-09 DIAGNOSIS — Z720 Tobacco use: Secondary | ICD-10-CM

## 2021-06-09 DIAGNOSIS — J45909 Unspecified asthma, uncomplicated: Secondary | ICD-10-CM

## 2021-06-09 DIAGNOSIS — R569 Unspecified convulsions: Secondary | ICD-10-CM

## 2021-06-09 DIAGNOSIS — G8929 Other chronic pain: Secondary | ICD-10-CM

## 2021-06-09 NOTE — Patient Instructions (Signed)
Visit Information ? ?Thank you for taking time to visit with me today. Please don't hesitate to contact me if I can be of assistance to you before our next scheduled telephone appointment. ? ?Following are the goals we discussed today:  ?(Copy and paste patient goals from clinical care plan here) ? ?Our next appointment is by telephone on 08/07/21 at 1:30 PM  ? ?Please call the care guide team at [phone] if you need to cancel or reschedule your appointment.  ? ?If you are experiencing a Mental Health or [place] or need someone to talk to, please call [phone]-TALK (toll free, 24 hour hotline)  ? ?The patient verbalized understanding of instructions, educational materials, and care plan provided today and agreed to receive a mailed copy of patient instructions, educational materials, and care plan.  ? ?Barb Merino, RN, BSN, CCM ?Care Management Coordinator ?[facility]  ?Direct Phone: [phone] ? ? ?

## 2021-06-09 NOTE — Chronic Care Management (AMB) (Signed)
?Chronic Care Management  ? ?CCM RN Visit Note ? ?06/09/2021 ?Name: [name] MRN: [id number] DOB: 03/12/1970 ? ?Subjective: ?[name] is a 51 y.o. year old male who is a primary care patient of [name], [place]. The care management team was consulted for assistance with disease management and care coordination needs.   ? ?Engaged with patient by telephone for follow up visit in response to provider referral for case management and/or care coordination services.  ? ?Consent to Services:  ?The patient was given information about Chronic Care Management services, agreed to services, and gave verbal consent prior to initiation of services.  Please see initial visit note for detailed documentation.  ? ?Patient agreed to services and verbal consent obtained.  ? ?Assessment: Review of patient past medical history, allergies, medications, health status, including review of consultants reports, laboratory and other test data, was performed as part of comprehensive evaluation and provision of chronic care management services.  ? ?SDOH (Social Determinants of Health) assessments and interventions performed:  Yes, no acute changes ? ?CCM Care Plan ? ?Allergies  ?Allergen Reactions  ? Aspirin Anaphylaxis  ? Ibuprofen Anaphylaxis  ? Hydrocodone Hives and Itching  ? Tylenol [Acetaminophen]   ?  Upset stomach  ? ? ?Outpatient Encounter Medications as of 06/09/2021  ?Medication Sig Note  ? atorvastatin (LIPITOR) 10 MG tablet Take 1 tablet by mouth at bedtime on MWF   ? folic acid (FOLVITE) 1 MG tablet TAKE 1 TABLET BY MOUTH EVERY DAY   ? levETIRAcetam (KEPPRA) 500 MG tablet Take 1 tablet (500 mg total) by mouth 2 (two) times daily.   ? Multiple Vitamin (MULTIVITAMIN WITH MINERALS) TABS tablet Take 1 tablet by mouth daily.   ? polyvinyl alcohol (LIQUIFILM TEARS) 1.4 % ophthalmic solution Place 1 drop into both eyes as needed for dry eyes.   ? thiamine (VITAMIN B-1) 100 MG tablet TAKE 1 TABLET BY MOUTH EVERY DAY   ?  Vitamin D, Ergocalciferol, (DRISDOL) 1.25 MG (50000 UNIT) CAPS capsule Take 1 capsule (50,000 Units total) by mouth 2 (two) times a week. 08/18/2020: Monday and Fridays  ? ?No facility-administered encounter medications on file as of 06/09/2021.  ? ? ?Patient Active Problem List  ? Diagnosis Date Noted  ? Personal history of stomach cancer 04/19/2019  ? Gastric cancer s/p distal gastrectomy 03/25/2019 03/25/2019  ? Personal history of colonic polyps 01/29/2019  ? History of pancreatitis 11/26/2017  ? Hyponatremia   ? Partial seizure ([place]) 03/05/2015  ? History of seizures 03/05/2015  ? Abdominal pain, chronic, epigastric 07/02/2014  ? ABDOMINAL PAIN, GENERALIZED 03/07/2010  ? KNEE PAIN, RIGHT 11/18/2009  ? HYPERGLYCEMIA 09/08/2009  ? ANKLE SPRAIN, LEFT 02/05/2009  ? BACK STRAIN, LUMBAR 02/05/2009  ? POLYURIA 10/28/2008  ? ABDOMINAL PAIN, EPIGASTRIC 10/28/2008  ? INSOMNIA, CHRONIC 10/10/2007  ? DEPRESSION, CHRONIC 10/10/2007  ? Asthma 10/10/2007  ? WOUND INFECTION 10/10/2007  ? History of alcohol abuse 04/15/2007  ? 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  ? PANCREATITIS, CHRONIC 04/15/2007  ? 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 ? ?Conditions to be addressed/monitored: Asthma, chronic pain, personal history of stomach cancer, partial Seizure, Vitamin D deficiency, Tobacco Use    ? ?Care Plan : RN Care Manager Plan of Care  ?Updates made by [name], RN since 06/09/2021 12:00 AM  ?  ? ?Problem: No plan of care established for management of chronic disease states (Asthma, chronic pain, personal history of stomach cancer, partial Seizure, Vitamin D deficiency, Tobacco  Use)   ?Priority: High  ?  ? ?Long-Range Goal: Development of plan of care for chronic disease managment for Asthma, chronic pain, personal history of stomach cancer, partial Seizure, Vitamin D deficiency, Tobacco Use   ?Recent Progress: On track  ?Priority: High  ?Note:   ?Current Barriers:  ? ?Knowledge Deficits related to plan of care for  management of Asthma, chronic pain, personal history of stomach cancer, partial Seizure, Vitamin D deficiency, Tobacco Use     ?Chronic Disease Management support and education needs related to Asthma, chronic pain, personal history of stomach cancer, partial Seizure, Vitamin D deficiency, Tobacco Use      ?RNCM Clinical Goal(s):  ?Patient will verbalize basic understanding of  Asthma, chronic pain, personal history of stomach cancer, partial Seizure, Vitamin D deficiency, Tobacco Use     disease process and self health management plan as evidenced by patient will experience no disease exacerbations related to his chronic disease states ?take all medications exactly as prescribed and will call provider for medication related questions as evidenced by patient will report having no missed doses of prescribed medication ?attend all scheduled medical appointments: PCP follow up/Neurology follow up  as evidenced by patient will keep scheduled appointments  ?demonstrate Ongoing health management independence as evidenced by    through collaboration with RN Care manager, provider, and care team.  ? ?Interventions: ?1:1 collaboration with primary care provider regarding development and update of comprehensive plan of care as evidenced by provider attestation and co-signature ?Inter-disciplinary care team collaboration (see longitudinal plan of care) ?Evaluation of current treatment plan related to  self management and patient's adherence to plan as established by provider ? ?Seizure Disorder Intervention: (Status:  Goal on track:  Yes.)  Long Term Goal ?Evaluation of current treatment plan related to  Seizure disorder , self-management and patient's adherence to plan as established by provider. ?Assessed for Seizure activity since last RN CM contact, patient reports 1 Seizure  ?Determined patient had stopped taking his Keppra and feels this was the reason for having Seizure activity ?Reviewed and discussed recent blood  level for Keppra is in the low range, educated on therapeutic range   ?Reviewed medications with patient and discussed indication, dosage and frequency of his prescribed medication; Reinforced the importance of medication adherence for best effectiveness, patient states he has restarted his medication ?Review of patient status, including review of consultant's reports, relevant laboratory and other test results ?Advised patient his blood glucose level was low during recent routine labs, educated on risk for hypoglycemia to trigger Seizures ?Advised patient his Sodium level was decreased to 130, educated on normal parameters, determined patient consumes a large amount of water in order to stay hydrated, educated patient on adequate daily water intake 48-64 oz unless otherwise directed ?Discussed other potential triggers for Seizure activity including getting overheated, encouraged patient to avoid directed prolonged sun/heat exposure ?Reviewed scheduled/upcoming provider appointments including: next PCP follow up appointment scheduled for 07/05/21 '@4'$ :20 PM; follow up with Neuro, Dr. Delice Lesch 08/04/21 '@4'$ :00 PM ?Discussed plans with patient for ongoing care management follow up and provided patient with direct contact information for care management team ? ?Vitamin D deficiency Intervention: (Status:  Goal on track:  Yes.)  Long Term Goal ?Evaluation of current treatment plan related to  Vitamin D deficiency ,  self-management and patient's adherence to plan as established by provider ?Review of patient status, including review of consultant's reports, relevant laboratory and other test results, and medications completed ?Educated on target Vitamin D  level for optimal health (60-80) ?Reviewed medications with patient and discussed indication, dosage and frequency of prescribed medication; reinforced importance of medication adherence for best effectiveness  ?Discussed plans with patient for ongoing care management follow  up and provided patient with direct contact information for care management team ?Vit D, 25-Hydroxy 30.0 - 100.0 ng/mL 54.2  12.4 Low  CM   ?Asthma Intervention: (Status:  Condition stable.  Not addressed this visi

## 2021-06-11 DIAGNOSIS — J45909 Unspecified asthma, uncomplicated: Secondary | ICD-10-CM

## 2021-07-05 ENCOUNTER — Encounter: Payer: Medicare Other | Admitting: Nurse Practitioner

## 2021-07-05 NOTE — Progress Notes (Deleted)
This visit occurred during the SARS-CoV-2 public health emergency.  Safety protocols were in place, including screening questions prior to the visit, additional usage of staff PPE, and extensive cleaning of exam room while observing appropriate contact time as indicated for disinfecting solutions.  Subjective:     Patient ID: [name] , male    DOB: 02-Mar-1970 , 51 y.o.   MRN: [id number]   No chief complaint on file.   HPI  Pt is here today to recheck his CMP & A1C.     Past Medical History:  Diagnosis Date   Abdominal pain    intermittent   Allergy    SEASONAL   Anemia    Aneurysm (HCC)    brain   Asthma    Gastric cancer (HCC)    GERD (gastroesophageal reflux disease)    Pancreatitis    PTSD (post-traumatic stress disorder) 2008   Rectal polyp 01/09/2006   Hyperplastic   Seizures (HCC)    ADMITS TAKING MEDICATION FOR SEIZURE D/T ANEURYSM ,DO NOT KNOW IF HAD SEIZURE. 11/10/18     Family History  Problem Relation Age of Onset   Stroke Mother    Hypertension Mother    Diabetes Father    Pancreatic cancer Maternal Uncle    Colon cancer Maternal Uncle    Cancer Other    Diabetes Other    Colon cancer Maternal Grandfather    Colon cancer Paternal Grandfather    Pancreatic cancer Cousin    Esophageal cancer Neg Hx    Rectal cancer Neg Hx    Stomach cancer Neg Hx      Current Outpatient Medications:    atorvastatin (LIPITOR) 10 MG tablet, Take 1 tablet by mouth at bedtime on MWF, Disp: 45 tablet, Rfl: 1   folic acid (FOLVITE) 1 MG tablet, TAKE 1 TABLET BY MOUTH EVERY DAY, Disp: 90 tablet, Rfl: 0   levETIRAcetam (KEPPRA) 500 MG tablet, Take 1 tablet (500 mg total) by mouth 2 (two) times daily., Disp: 60 tablet, Rfl: 0   Multiple Vitamin (MULTIVITAMIN WITH MINERALS) TABS tablet, Take 1 tablet by mouth daily., Disp:  , Rfl:    polyvinyl alcohol (LIQUIFILM TEARS) 1.4 % ophthalmic solution, Place 1 drop into both eyes as needed for dry eyes., Disp: , Rfl:     thiamine (VITAMIN B-1) 100 MG tablet, TAKE 1 TABLET BY MOUTH EVERY DAY, Disp: 100 tablet, Rfl: 1   Vitamin D, Ergocalciferol, (DRISDOL) 1.25 MG (50000 UNIT) CAPS capsule, Take 1 capsule (50,000 Units total) by mouth 2 (two) times a week., Disp: 24 capsule, Rfl: 1   Allergies  Allergen Reactions   Aspirin Anaphylaxis   Ibuprofen Anaphylaxis   Hydrocodone Hives and Itching   Tylenol [Acetaminophen]     Upset stomach     Review of Systems  Constitutional: Negative.   HENT: Negative.    Respiratory: Negative.    Cardiovascular: Negative.   Gastrointestinal: Negative.   Endocrine: Negative.   Skin: Negative.   Neurological: Negative.   Hematological: Negative.     There were no vitals filed for this visit. There is no height or weight on file to calculate BMI.   Objective:  Physical Exam      Assessment And Plan:     1. Asthma, unspecified asthma severity, unspecified whether complicated, unspecified whether persistent  2. Personal history of stomach cancer  3. Partial seizure ([place])  4. Atherosclerosis of aorta [facility])     Patient was given opportunity to ask questions. Patient  verbalized understanding of the plan and was able to repeat key elements of the plan. All questions were answered to their satisfaction.  Debbora Dus, CMA   I, Debbora Dus, CMA, have reviewed all documentation for this visit. The documentation on 07/05/21 for the exam, diagnosis, procedures, and orders are all accurate and complete.   IF YOU HAVE BEEN REFERRED TO A SPECIALIST, IT MAY TAKE 1-2 WEEKS TO SCHEDULE/PROCESS THE REFERRAL. IF YOU HAVE NOT HEARD FROM US/SPECIALIST IN TWO WEEKS, PLEASE GIVE [place] A CALL AT [phone].   THE PATIENT IS ENCOURAGED TO PRACTICE SOCIAL DISTANCING DUE TO THE COVID-19 PANDEMIC.

## 2021-07-11 NOTE — Progress Notes (Signed)
No show

## 2021-07-24 ENCOUNTER — Other Ambulatory Visit: Payer: Self-pay

## 2021-08-04 ENCOUNTER — Ambulatory Visit (INDEPENDENT_AMBULATORY_CARE_PROVIDER_SITE_OTHER): Payer: Medicare Other | Admitting: Neurology

## 2021-08-04 ENCOUNTER — Encounter: Payer: Self-pay | Admitting: Neurology

## 2021-08-04 VITALS — BP 113/72 | HR 66 | Ht 74.0 in | Wt 159.4 lb

## 2021-08-04 DIAGNOSIS — G40209 Localization-related (focal) (partial) symptomatic epilepsy and epileptic syndromes with complex partial seizures, not intractable, without status epilepticus: Secondary | ICD-10-CM

## 2021-08-04 MED ORDER — LEVETIRACETAM 500 MG PO TABS: 500.0000 mg | ORAL_TABLET | Freq: Two times a day (BID) | ORAL | 3 refills | Status: AC

## 2021-08-04 MED ORDER — VITAMIN B-1 100 MG PO TABS: 100.0000 mg | ORAL_TABLET | Freq: Every day | ORAL | 3 refills | Status: DC

## 2021-08-07 ENCOUNTER — Telehealth: Payer: Self-pay

## 2021-08-07 ENCOUNTER — Telehealth: Payer: Medicare Other

## 2021-09-18 ENCOUNTER — Telehealth: Payer: Medicare Other

## 2021-09-19 ENCOUNTER — Ambulatory Visit: Payer: Self-pay

## 2021-09-19 NOTE — Patient Instructions (Signed)
Visit Information  Thank you for taking time to visit with me today. Please don't hesitate to contact me if I can be of assistance to you.   Following are the goals we discussed today:   Goals Addressed      To stay Seizure free       Care Coordination Interventions: Evaluation of current treatment plan related to Seizure disorder, self-management and patient's adherence to plan as established by provider Review of patient status, including review of consultants reports, relevant laboratory and other test results Reviewed patient's medications and educated on the importance of adherence Educated patient regarding triggers that may exacerbate Seizures, including getting overheated and or dehydrated Instructed patient to complete outdoor activities before sunrise and or after sunset and to stay well hydrated with water Reviewed and discussed importance to record all Seizure activity to provide to his doctor  Reviewed and discussed with patient, prescribed treatment plan of care for management of Seizure disorder per Dr. Ellouise Newer MD; IMPRESSION: This is a 51 yo RH man with a history of pancreatitis, subdural hematoma s/p evacuation in 2017 who had focal seizures with impaired awareness a few days after hospital discharge in 2017. He had been seizure-free for 5 years until July 2022 and most recently March 2023 in the setting of missing medication. Keppra level was non-existent in April. He reports taking his medication on a regular basis now, refills sent for Levetiracetam '500mg'$  BID. We discussed avoidance of seizure triggers. We again discussed [place] driving laws to stop driving after a seizure until 6 months seizure-free. He asked for refills for vitamin B1, this was sent in today. Follow-up in 6 months, call for any change    Our next appointment is by telephone on 10/20/21 at 09:00 AM   Please call the care guide team at [phone] if you need to cancel or reschedule your appointment.    If you are experiencing a Mental Health or [place] or need someone to talk to, please call 1-800-273-TALK (toll free, 24 hour hotline)  The patient verbalized understanding of instructions, educational materials, and care plan provided today and agreed to receive a mailed copy of patient instructions, educational materials, and care plan.   Barb Merino, RN, BSN, CCM Care Management Coordinator [facility] Care Management  Direct Phone: [phone]

## 2021-09-19 NOTE — Progress Notes (Signed)
This encounter was created in error - please disregard.

## 2021-09-19 NOTE — Patient Outreach (Signed)
  Care Coordination   Follow Up Visit Note   09/19/2021 Name: [name] MRN: [id number] DOB: [birth year]  [name] is a 51 y.o. year old male who sees [name], FNP for primary care. I spoke with  [name] by phone today  What matters to the patients health and wellness today?  Patient would like to remain Seizure free.    Goals Addressed      To stay Seizure free       Care Coordination Interventions: Evaluation of current treatment plan related to Seizure disorder, self-management and patient's adherence to plan as established by provider Review of patient status, including review of consultants reports, relevant laboratory and other test results Reviewed patient's medications and educated on the importance of adherence Educated patient regarding triggers that may exacerbate Seizures, including getting overheated and or dehydrated Instructed patient to complete outdoor activities before sunrise and or after sunset and to stay well hydrated with water Reviewed and discussed importance to record all Seizure activity to provide to his doctor  Reviewed and discussed with patient, prescribed treatment plan of care for management of Seizure disorder per Dr. Ellouise Newer MD; IMPRESSION: This is a 51 yo RH man with a history of pancreatitis, subdural hematoma s/p evacuation in 2017 who had focal seizures with impaired awareness a few days after hospital discharge in 2017. He had been seizure-free for 5 years until July 2022 and most recently March 2023 in the setting of missing medication. Keppra level was non-existent in April. He reports taking his medication on a regular basis now, refills sent for Levetiracetam '500mg'$  BID. We discussed avoidance of seizure triggers. We again discussed [place] driving laws to stop driving after a seizure until 6 months seizure-free. He asked for refills for vitamin B1, this was sent in today. Follow-up in 6 months, call for any change     SDOH assessments and interventions completed:  Yes     Care Coordination Interventions Activated:  Yes  Care Coordination Interventions:  Yes, provided   Follow up plan: Follow up call scheduled for 10/20/21 '@09'$ :00 AM     Encounter Outcome:  Pt. Visit Completed

## 2021-09-30 ENCOUNTER — Encounter (HOSPITAL_COMMUNITY): Payer: Self-pay | Admitting: Emergency Medicine

## 2021-09-30 ENCOUNTER — Emergency Department (HOSPITAL_COMMUNITY)
Admission: EM | Admit: 2021-09-30 | Discharge: 2021-10-01 | Disposition: A | Discharge status: Home / Self Care | Payer: Medicare Other | Attending: Emergency Medicine | Admitting: Emergency Medicine

## 2021-09-30 ENCOUNTER — Other Ambulatory Visit: Payer: Self-pay

## 2021-09-30 DIAGNOSIS — S00411A Abrasion of right ear, initial encounter: Secondary | ICD-10-CM | POA: Insufficient documentation

## 2021-09-30 DIAGNOSIS — X58XXXA Exposure to other specified factors, initial encounter: Secondary | ICD-10-CM | POA: Insufficient documentation

## 2021-09-30 DIAGNOSIS — H9201 Otalgia, right ear: Secondary | ICD-10-CM | POA: Diagnosis present

## 2021-09-30 NOTE — ED Triage Notes (Signed)
Patient notice blood at [place] while cleaning his right ear this evening , denies injury /no hearing loss.

## 2021-10-01 MED ORDER — CIPROFLOXACIN-DEXAMETHASONE 0.3-0.1 % OT SUSP
4.0000 [drp] | Freq: Two times a day (BID) | OTIC | Status: DC
  Administered 2021-10-01: 4 [drp] via OTIC
  Filled 2021-10-01: qty 7.5

## 2021-10-01 NOTE — ED Provider Notes (Signed)
[facility] EMERGENCY DEPARTMENT Provider Note   CSN: [id number] Arrival date & time: 09/30/21  2058     History  Chief Complaint  Patient presents with   Right Ear Bleeding    [name] is a 51 y.o. male.  The history is provided by the patient and medical records.   51 y.o. M here with right ear pain/bleeding.  States he was cleaning his ear with his car keys when he all of a sudden saw some blood and became concerned.  Denies any acute changes in hearing.  No other acute concerns.  Home Medications Prior to Admission medications   Medication Sig Start Date End Date Taking? Authorizing Provider  atorvastatin (LIPITOR) 10 MG tablet Take 1 tablet by mouth at bedtime on MWF 06/04/21   [name], FNP  folic acid (FOLVITE) 1 MG tablet TAKE 1 TABLET BY MOUTH EVERY DAY 09/24/19   [name], FNP  levETIRAcetam (KEPPRA) 500 MG tablet Take 1 tablet (500 mg total) by mouth 2 (two) times daily. 08/04/21   Cameron Sprang, MD  Multiple Vitamin (MULTIVITAMIN WITH MINERALS) TABS tablet Take 1 tablet by mouth daily. 03/31/19   Maczis, Barth Kirks, PA-C  polyvinyl alcohol (LIQUIFILM TEARS) 1.4 % ophthalmic solution Place 1 drop into both eyes as needed for dry eyes.    [provider]  thiamine (VITAMIN B-1) 100 MG tablet Take 1 tablet (100 mg total) by mouth daily. 08/04/21   Cameron Sprang, MD  vitamin B-12 (CYANOCOBALAMIN) 1000 MCG tablet Take 1,000 mcg by mouth daily.    [provider]  Vitamin D, Ergocalciferol, (DRISDOL) 1.25 MG (50000 UNIT) CAPS capsule Take 1 capsule (50,000 Units total) by mouth 2 (two) times a week. 04/28/20   [name], FNP      Allergies    Aspirin, Ibuprofen, Hydrocodone, and Tylenol [acetaminophen]    Review of Systems   Review of Systems  HENT:  Positive for ear pain.   All other systems reviewed and are negative.   Physical Exam Updated Vital Signs BP 125/85 (BP Location: Right Arm)   Pulse (!) 58    Temp (!) 97.4 F (36.3 C) (Oral)   Resp 14   SpO2 100%   Physical Exam Vitals and nursing note reviewed.  Constitutional:      Appearance: He is well-developed.  HENT:     Head: Normocephalic and atraumatic.     Ears:     Comments: Abrasion noted to right EAC along posterior wall, dried blood without active bleeding, TM intact, no signs of perforation Left ear normal Eyes:     Conjunctiva/sclera: Conjunctivae normal.     Pupils: Pupils are equal, round, and reactive to light.  Cardiovascular:     Rate and Rhythm: Normal rate and regular rhythm.     Heart sounds: Normal heart sounds.  Pulmonary:     Effort: Pulmonary effort is normal.     Breath sounds: Normal breath sounds.  Abdominal:     General: Bowel sounds are normal.     Palpations: Abdomen is soft.  Musculoskeletal:        General: Normal range of motion.     Cervical back: Normal range of motion.  Skin:    General: Skin is warm and dry.  Neurological:     Mental Status: He is alert and oriented to person, place, and time.     ED Results / Procedures / Treatments   Labs (all labs ordered are listed,  but only abnormal results are displayed) Labs Reviewed - No data to display  EKG None  Radiology No results found.  Procedures Procedures    Medications Ordered in ED Medications  ciprofloxacin-dexamethasone (CIPRODEX) 0.3-0.1 % OTIC (EAR) suspension 4 drop (4 drops Right EAR Given 10/01/21 0404)    ED Course/ Medical Decision Making/ A&P                           Medical Decision Making Risk Prescription drug management.   51 year old male presenting to the ED with concerns of bleeding in right ear after cleaning his ear with a car keys.  Appears to have abrasion along posterior aspect of right ear canal.  There is no active bleeding.  TM is intact.  Will start on Ciprodex drops, can follow-up with PCP.  Return here for new concerns.  Final Clinical Impression(s) / ED Diagnoses Final diagnoses:   Abrasion of right ear canal, initial encounter    Rx / DC Orders ED Discharge Orders     None         Larene Pickett, PA-C 10/01/21 0427    Palumbo, April, MD 10/01/21 [phone]

## 2021-10-01 NOTE — Discharge Instructions (Signed)
Use the ear drops as directed---4 drops in right ear twice daily. Follow-up with your primary care doctor if any ongoing issues. Return here for new concerns.

## 2021-10-05 ENCOUNTER — Telehealth: Payer: Self-pay

## 2021-10-05 NOTE — Telephone Encounter (Signed)
Transition Care Management Follow-up Telephone Call Date of discharge and from where: 09/30/2021 [place]  How have you been since you were released from the hospital? Pt states he is doing okay for now, he noticed when he was cleaning his nose out & also noticed some blood.  Any questions or concerns? No  Items Reviewed: Did the pt receive and understand the discharge instructions provided? Yes  Medications obtained and verified? Yes  Other? Yes  Any new allergies since your discharge? No  Dietary orders reviewed? Yes Do you have support at home? No   Home Care and Equipment/Supplies: Were home health services ordered? no If so, what is the name of the agency? N/a  Has the agency set up a time to come to the patient's home? no Were any new equipment or medical supplies ordered?  No What is the name of the medical supply agency? N/a Were you able to get the supplies/equipment? no Do you have any questions related to the use of the equipment or supplies? No  Functional Questionnaire: (I = Independent and D = Dependent) ADLs: i  Bathing/Dressing- i  Meal Prep- i  Eating- i  Maintaining continence- i  Transferring/Ambulation- i  Managing Meds- i  Follow up appointments reviewed:  PCP Hospital f/u appt confirmed? Yes  Scheduled to see janece moore on n/a @ n/a. [facility] f/u appt confirmed? No  Scheduled to see n/a on n/a @ n/a. Are transportation arrangements needed? No  If their condition worsens, is the pt aware to call PCP or go to the Emergency Dept.? Yes Was the patient provided with contact information for the PCP's office or ED? Yes Was to pt encouraged to call back with questions or concerns? Yes

## 2021-10-18 ENCOUNTER — Ambulatory Visit (INDEPENDENT_AMBULATORY_CARE_PROVIDER_SITE_OTHER): Payer: Medicare Other | Admitting: Nurse Practitioner

## 2021-10-18 ENCOUNTER — Encounter: Payer: Self-pay | Admitting: Nurse Practitioner

## 2021-10-18 VITALS — BP 124/66 | HR 77 | Temp 98.3°F | Ht 74.0 in | Wt 159.0 lb

## 2021-10-18 DIAGNOSIS — S00411D Abrasion of right ear, subsequent encounter: Secondary | ICD-10-CM | POA: Diagnosis not present

## 2021-10-18 DIAGNOSIS — Z23 Encounter for immunization: Secondary | ICD-10-CM

## 2021-10-18 DIAGNOSIS — R7309 Other abnormal glucose: Secondary | ICD-10-CM | POA: Diagnosis not present

## 2021-10-18 DIAGNOSIS — I700 Atherosclerosis of aorta: Secondary | ICD-10-CM | POA: Diagnosis not present

## 2021-10-18 DIAGNOSIS — Z720 Tobacco use: Secondary | ICD-10-CM | POA: Diagnosis not present

## 2021-10-18 DIAGNOSIS — S00411A Abrasion of right ear, initial encounter: Secondary | ICD-10-CM

## 2021-10-18 MED ORDER — NICOTINE 21 MG/24HR TD PT24: 21.0000 mg | MEDICATED_PATCH | TRANSDERMAL | 1 refills | Status: AC

## 2021-10-18 NOTE — Patient Instructions (Signed)
Otitis Media, Adult  Otitis media is a condition in which the middle ear is red and swollen (inflamed) and full of fluid. The middle ear is the part of the ear that contains bones for hearing as well as air that helps send sounds to the brain. The condition usually goes away on its own. What are the causes? This condition is caused by a blockage in the eustachian tube. This tube connects the middle ear to the back of the nose. It normally allows air into the middle ear. The blockage is caused by fluid or swelling. Problems that can cause blockage include: A cold or infection that affects the nose, mouth, or throat. Allergies. An irritant, such as tobacco smoke. Adenoids that have become large. The adenoids are soft tissue located in the back of the throat, behind the nose and the roof of the mouth. Growth or swelling in the upper part of the throat, just behind the nose (nasopharynx). Damage to the ear caused by a change in pressure. This is called barotrauma. What increases the risk? You are more likely to develop this condition if you: Smoke or are exposed to tobacco smoke. Have an opening in the roof of your mouth (cleft palate). Have acid reflux. Have problems in your body's defense system (immune system). What are the signs or symptoms? Symptoms of this condition include: Ear pain. Fever. Problems with hearing. Being tired. Fluid leaking from the ear. Ringing in the ear. How is this treated? This condition can go away on its own within 3-5 days. But if the condition is caused by germs (bacteria) and does not go away on its own, or if it keeps coming back, your doctor may: Give you antibiotic medicines. Give you medicines for pain. Follow these instructions at home: Take over-the-counter and prescription medicines only as told by your doctor. If you were prescribed an antibiotic medicine, take it as told by your doctor. Do not stop taking it even if you start to feel better. Keep  all follow-up visits. Contact a doctor if: You have bleeding from your nose. There is a lump on your neck. You are not feeling better in 5 days. You feel worse instead of better. Get help right away if: You have pain that is not helped with medicine. You have swelling, redness, or pain around your ear. You get a stiff neck. You cannot move part of your face (paralysis). You notice that the bone behind your ear hurts when you touch it. You get a very bad headache. Summary Otitis media means that the middle ear is red, swollen, and full of fluid. This condition usually goes away on its own. If the problem does not go away, treatment may be needed. You may be given medicines to treat the infection or to treat your pain. If you were prescribed an antibiotic medicine, take it as told by your doctor. Do not stop taking it even if you start to feel better. Keep all follow-up visits. This information is not intended to replace advice given to you by your health care provider. Make sure you discuss any questions you have with your health care provider. Document Revised: 05/09/2020 Document Reviewed: 05/09/2020 Elsevier Patient Education  2023 Elsevier Inc.  

## 2021-10-18 NOTE — Progress Notes (Unsigned)
I,Tianna Badgett,acting as a Education administrator for [employer], FNP.,have documented all relevant documentation on the behalf of [name], FNP,as directed by  [name], FNP while in the presence of [name], [place].  Subjective:     Patient ID: [name] , male    DOB: Apr 13, 1970 , 51 y.o.   MRN: [id number]   Chief Complaint  Patient presents with   Ear Pain    HPI  Patient presents today for ED follow up. He was cleaning his right ear with a key and toilet paper and there was a little "bump" that started to bleed. Denies hearing loss. No further issues with his ear.      Past Medical History:  Diagnosis Date   Abdominal pain    intermittent   Allergy    SEASONAL   Anemia    Aneurysm (HCC)    brain   Asthma    Gastric cancer (HCC)    GERD (gastroesophageal reflux disease)    Pancreatitis    PTSD (post-traumatic stress disorder) 2008   Rectal polyp 01/09/2006   Hyperplastic   Seizures (HCC)    ADMITS TAKING MEDICATION FOR SEIZURE D/T ANEURYSM ,DO NOT KNOW IF HAD SEIZURE. 11/10/18     Family History  Problem Relation Age of Onset   Stroke Mother    Hypertension Mother    Diabetes Father    Pancreatic cancer Maternal Uncle    Colon cancer Maternal Uncle    Cancer Other    Diabetes Other    Colon cancer Maternal Grandfather    Colon cancer Paternal Grandfather    Pancreatic cancer Cousin    Esophageal cancer Neg Hx    Rectal cancer Neg Hx    Stomach cancer Neg Hx      Current Outpatient Medications:    nicotine (NICODERM CQ - DOSED IN MG/24 HOURS) 21 mg/24hr patch, Place 1 patch (21 mg total) onto the skin daily., Disp: 30 patch, Rfl: 1   atorvastatin (LIPITOR) 10 MG tablet, Take 1 tablet by mouth at bedtime on MWF, Disp: 45 tablet, Rfl: 1   folic acid (FOLVITE) 1 MG tablet, TAKE 1 TABLET BY MOUTH EVERY DAY, Disp: 90 tablet, Rfl: 0   levETIRAcetam (KEPPRA) 500 MG tablet, Take 1 tablet (500 mg total) by mouth 2 (two) times daily., Disp: 180 tablet, Rfl:  3   Multiple Vitamin (MULTIVITAMIN WITH MINERALS) TABS tablet, Take 1 tablet by mouth daily., Disp:  , Rfl:    polyvinyl alcohol (LIQUIFILM TEARS) 1.4 % ophthalmic solution, Place 1 drop into both eyes as needed for dry eyes., Disp: , Rfl:    thiamine (VITAMIN B-1) 100 MG tablet, Take 1 tablet (100 mg total) by mouth daily., Disp: 90 tablet, Rfl: 3   Vitamin D, Ergocalciferol, (DRISDOL) 1.25 MG (50000 UNIT) CAPS capsule, Take 1 capsule (50,000 Units total) by mouth 2 (two) times a week., Disp: 24 capsule, Rfl: 1   Allergies  Allergen Reactions   Aspirin Anaphylaxis   Ibuprofen Anaphylaxis   Hydrocodone Hives and Itching   Tylenol [Acetaminophen]     Upset stomach     Review of Systems  Constitutional: Negative.   Respiratory: Negative.    Cardiovascular: Negative.   Gastrointestinal: Negative.   Neurological: Negative.      Today's Vitals   10/18/21 1535  BP: 124/66  Pulse: 77  Temp: 98.3 F (36.8 C)  TempSrc: Other (Comment)  Weight: 159 lb (72.1 kg)  Height: $Remove'6\' 2"'oWBITzz$  (1.88 m)   Body mass  index is 20.41 kg/m.  Wt Readings from Last 3 Encounters:  10/18/21 159 lb (72.1 kg)  08/04/21 159 lb 6.4 oz (72.3 kg)  06/07/21 161 lb 12.8 oz (73.4 kg)    Objective:  Physical Exam Vitals reviewed.  Constitutional:      General: He is not in acute distress.    Appearance: Normal appearance.  HENT:     Right Ear: Tympanic membrane and external ear normal. There is no impacted cerumen.     Left Ear: Tympanic membrane, ear canal and external ear normal. There is no impacted cerumen.     Ears:     Comments: Right ear canal with dried blood present Cardiovascular:     Rate and Rhythm: Normal rate and regular rhythm.     Pulses: Normal pulses.     Heart sounds: Normal heart sounds. No murmur heard. Pulmonary:     Effort: Pulmonary effort is normal. No respiratory distress.     Breath sounds: Normal breath sounds. No wheezing.  Musculoskeletal:        General: Normal range of  motion.     Cervical back: Normal range of motion and neck supple.  Skin:    General: Skin is warm and dry.     Capillary Refill: Capillary refill takes less than 2 seconds.  Neurological:     General: No focal deficit present.     Mental Status: He is alert and oriented to person, place, and time.     Cranial Nerves: No cranial nerve deficit.     Motor: No weakness.  Psychiatric:        Mood and Affect: Mood normal.        Behavior: Behavior normal.        Thought Content: Thought content normal.        Judgment: Judgment normal.         Assessment And Plan:     1. Abrasion of right ear, subsequent encounter Comments: Seen in ER after sticking his key in his ear and began bleeding. Has dried blood present to ear canal. Advised to avoid picking in the year  2. Atherosclerosis of aorta (HCC) Comments: Continue statin, tolerating well.   3. Abnormal glucose Comments: Slightly elevated previously will repeat today, encouraged to follow diet low in sugar and starches.  - CMP14+EGFR - Hemoglobin A1c  4. Tobacco abuse Comments: He is ready to quit smoking sent Rx for nicotine patches. Smoking cessation instruction/counseling given:  counseled patient on the dangers of tobacco use, advised patient to stop smoking, and reviewed strategies to maximize success  - nicotine (NICODERM CQ - DOSED IN MG/24 HOURS) 21 mg/24hr patch; Place 1 patch (21 mg total) onto the skin daily.  Dispense: 30 patch; Refill: 1  5. Encounter for immunization Influenza vaccine administered Encouraged to take Tylenol as needed for fever or muscle aches. - Flu Vaccine QUAD 6+ mos PF IM (Fluarix Quad PF)     Patient was given opportunity to ask questions. Patient verbalized understanding of the plan and was able to repeat key elements of the plan. All questions were answered to their satisfaction.  [name], FNP   I, [name], FNP, have reviewed all documentation for this visit. The documentation on  10/18/21 for the exam, diagnosis, procedures, and orders are all accurate and complete.   IF YOU HAVE BEEN REFERRED TO A SPECIALIST, IT MAY TAKE 1-2 WEEKS TO SCHEDULE/PROCESS THE REFERRAL. IF YOU HAVE NOT HEARD FROM US/SPECIALIST IN TWO WEEKS, PLEASE  GIVE [place] A CALL AT [phone].   THE PATIENT IS ENCOURAGED TO PRACTICE SOCIAL DISTANCING DUE TO THE COVID-19 PANDEMIC.

## 2021-10-19 LAB — CMP14+EGFR
ALT: 14 IU/L (ref 0–44)
AST: 18 IU/L (ref 0–40)
Albumin/Globulin Ratio: 1.4 (ref 1.2–2.2)
Albumin: 3.7 g/dL — ABNORMAL LOW (ref 3.8–4.9)
Alkaline Phosphatase: 52 IU/L (ref 44–121)
BUN/Creatinine Ratio: 8 — ABNORMAL LOW (ref 9–20)
BUN: 7 mg/dL (ref 6–24)
Bilirubin Total: 0.4 mg/dL (ref 0.0–1.2)
CO2: 24 mmol/L (ref 20–29)
Calcium: 8.4 mg/dL — ABNORMAL LOW (ref 8.7–10.2)
Chloride: 96 mmol/L (ref 96–106)
Creatinine, Ser: 0.85 mg/dL (ref 0.76–1.27)
Globulin, Total: 2.6 g/dL (ref 1.5–4.5)
Glucose: 79 mg/dL (ref 70–99)
Potassium: 4.4 mmol/L (ref 3.5–5.2)
Sodium: 131 mmol/L — ABNORMAL LOW (ref 134–144)
Total Protein: 6.3 g/dL (ref 6.0–8.5)
eGFR: 104 mL/min/{1.73_m2} (ref >59)

## 2021-10-19 LAB — HEMOGLOBIN A1C
Est. average glucose Bld gHb Est-mCnc: 117 mg/dL
Hgb A1c MFr Bld: 5.7 % — ABNORMAL HIGH (ref 4.8–5.6)

## 2021-10-20 ENCOUNTER — Ambulatory Visit: Payer: Self-pay

## 2021-10-20 NOTE — Patient Outreach (Signed)
  Care Coordination   Follow Up Visit Note   10/20/2021 Name: Harald Quevedo MRN: [id number] DOB: [birth year]  Kamare Jevaughn Degollado is a 51 y.o. year old male who sees [name], FNP for primary care. I spoke with  Quashawn Earley Brooke by phone today.  What matters to the patients health and wellness today?  Patient would like to learn more about Aortic Atherosclerosis.     Goals Addressed      I would like to know more about Aortic Atherosclerosis       Care Coordination Interventions: Provider established cholesterol goals reviewed Counseled on importance of regular laboratory monitoring as prescribed Reviewed role and benefits of statin for ASCVD risk reduction Reviewed exercise goals and target of 150 minutes per week Mailed printed educational materials related to Aortic Atherosclerosis Educated patient about the PREP program, sent in basket message to PCP requesting PREP referral     SDOH assessments and interventions completed:  No     Care Coordination Interventions Activated:  Yes  Care Coordination Interventions:  Yes, provided   Follow up plan: Follow up call scheduled for 11/03/21 '@12'$ :45 PM     Encounter Outcome:  Pt. Visit Completed

## 2021-10-20 NOTE — Patient Instructions (Signed)
Visit Information  Thank you for taking time to visit with me today. Please don't hesitate to contact me if I can be of assistance to you.   Following are the goals we discussed today:   Goals Addressed      I would like to know more about Aortic Atherosclerosis       Care Coordination Interventions: Provider established cholesterol goals reviewed Counseled on importance of regular laboratory monitoring as prescribed Reviewed role and benefits of statin for ASCVD risk reduction Reviewed exercise goals and target of 150 minutes per week Mailed printed educational materials related to Aortic Atherosclerosis Educated patient about the PREP program, sent in basket message to PCP requesting PREP referral     Our next appointment is by telephone on 11/03/21 at 12:45 PM  Please call the care guide team at [phone] if you need to cancel or reschedule your appointment.   If you are experiencing a Mental Health or [place] or need someone to talk to, please call 1-800-273-TALK (toll free, 24 hour hotline)  The patient verbalized understanding of instructions, educational materials, and care plan provided today and agreed to receive a mailed copy of patient instructions, educational materials, and care plan.   Barb Merino, RN, BSN, CCM Care Management Coordinator [facility] Care Management  Direct Phone: [phone]

## 2021-10-31 ENCOUNTER — Encounter: Payer: Self-pay | Admitting: Nurse Practitioner

## 2021-11-01 ENCOUNTER — Other Ambulatory Visit: Payer: Self-pay | Admitting: Nurse Practitioner

## 2021-11-01 DIAGNOSIS — R7309 Other abnormal glucose: Secondary | ICD-10-CM

## 2021-11-01 DIAGNOSIS — I700 Atherosclerosis of aorta: Secondary | ICD-10-CM

## 2021-11-03 ENCOUNTER — Telehealth: Payer: Self-pay

## 2021-11-03 ENCOUNTER — Ambulatory Visit: Payer: Self-pay

## 2021-11-03 NOTE — Patient Instructions (Signed)
Visit Information  Thank you for taking time to visit with me today. Please don't hesitate to contact me if I can be of assistance to you.   Following are the goals we discussed today:   Goals Addressed             This Visit's Progress    I would like to know more about Aortic Atherosclerosis       Care Coordination Interventions: Notified patient PCP approved for him to participate in the provider referral exercise program  Discussed nurse Winifred Olive left him a vm to return call to help get started Determined patient will return call and is looking forward to getting started  Instructed patient to notify his doctor of new symptoms or concerns          Our next appointment is by telephone on 01/01/22 at 12 PM  Please call the care guide team at [phone] if you need to cancel or reschedule your appointment.   If you are experiencing a Mental Health or [place] or need someone to talk to, please call 1-800-273-TALK (toll free, 24 hour hotline)  The patient verbalized understanding of instructions, educational materials, and care plan provided today and agreed to receive a mailed copy of patient instructions, educational materials, and care plan.   Barb Merino, RN, BSN, CCM Care Management Coordinator [facility] Direct Phone: [phone]

## 2021-11-03 NOTE — Patient Outreach (Signed)
  Care Coordination   Follow Up Visit Note   11/03/2021 Name: Norton Bivins MRN: [id number] DOB: [birth year]  Maxxon Keahi Mccarney is a 51 y.o. year old male who sees [name], FNP for primary care. I spoke with  Rufus Earley Brooke by phone today.  What matters to the patients health and wellness today?  Patient would like to participate in the provider referral exercise program.     Goals Addressed             This Visit's Progress    I would like to know more about Aortic Atherosclerosis       Care Coordination Interventions: Notified patient PCP approved for him to participate in the provider referral exercise program  Discussed nurse Winifred Olive left him a vm to return call to help get started Determined patient will return call and is looking forward to getting started  Instructed patient to notify his doctor of new symptoms or concerns          SDOH assessments and interventions completed:  No     Care Coordination Interventions Activated:  Yes  Care Coordination Interventions:  Yes, provided   Follow up plan: Follow up call scheduled for 01/01/22 '@12'$ :00 PM     Encounter Outcome:  Pt. Visit Completed

## 2021-11-03 NOTE — Telephone Encounter (Signed)
VMT pt requesting call back reference PREP referral.   

## 2021-11-06 ENCOUNTER — Other Ambulatory Visit: Payer: Self-pay

## 2021-11-09 ENCOUNTER — Encounter: Payer: Self-pay | Admitting: Gastroenterology

## 2021-11-09 ENCOUNTER — Ambulatory Visit (INDEPENDENT_AMBULATORY_CARE_PROVIDER_SITE_OTHER): Payer: Medicare Other | Admitting: Nurse Practitioner

## 2021-11-09 VITALS — BP 122/78 | HR 68 | Temp 97.6°F | Ht 74.0 in | Wt 152.0 lb

## 2021-11-09 DIAGNOSIS — R1084 Generalized abdominal pain: Secondary | ICD-10-CM

## 2021-11-09 LAB — POCT URINALYSIS DIPSTICK
Blood, UA: NEGATIVE
Glucose, UA: NEGATIVE
Leukocytes, UA: NEGATIVE
Nitrite, UA: NEGATIVE
Protein, UA: NEGATIVE
Spec Grav, UA: >=1.03 — AB (ref 1.010–1.025)
Urobilinogen, UA: 1 E.U./dL
pH, UA: 6 (ref 5.0–8.0)

## 2021-11-09 MED ORDER — OXYCODONE-ACETAMINOPHEN 10-325 MG PO TABS: 1.0000 | ORAL_TABLET | Freq: Four times a day (QID) | ORAL | 0 refills | Status: AC | PRN

## 2021-11-09 NOTE — Patient Instructions (Signed)
Abdominal Pain, Adult Many things can cause belly (abdominal) pain. Most times, belly pain is not dangerous. Many cases of belly pain can be watched and treated at home. Sometimes, though, belly pain is serious. Your doctor will try to find the cause of your belly pain. Follow these instructions at home:  Medicines Take over-the-counter and prescription medicines only as told by your doctor. Do not take medicines that help you poop (laxatives) unless told by your doctor. General instructions Watch your belly pain for any changes. Drink enough fluid to keep your pee (urine) pale yellow. Keep all follow-up visits as told by your doctor. This is important. Contact a doctor if: Your belly pain changes or gets worse. You are not hungry, or you lose weight without trying. You are having trouble pooping (constipated) or have watery poop (diarrhea) for more than 2-3 days. You have pain when you pee or poop. Your belly pain wakes you up at night. Your pain gets worse with meals, after eating, or with certain foods. You are vomiting and cannot keep anything down. You have a fever. You have blood in your pee. Get help right away if: Your pain does not go away as soon as your doctor says it should. You cannot stop vomiting. Your pain is only in areas of your belly, such as the right side or the left lower part of the belly. You have bloody or black poop, or poop that looks like tar. You have very bad pain, cramping, or bloating in your belly. You have signs of not having enough fluid or water in your body (dehydration), such as: Dark pee, very little pee, or no pee. Cracked lips. Dry mouth. Sunken eyes. Sleepiness. Weakness. You have trouble breathing or chest pain. Summary Many cases of belly pain can be watched and treated at home. Watch your belly pain for any changes. Take over-the-counter and prescription medicines only as told by your doctor. Contact a doctor if your belly pain  changes or gets worse. Get help right away if you have very bad pain, cramping, or bloating in your belly. This information is not intended to replace advice given to you by your health care provider. Make sure you discuss any questions you have with your health care provider. Document Revised: 06/09/2018 Document Reviewed: 06/09/2018 Elsevier Patient Education  2023 Elsevier Inc.  

## 2021-11-09 NOTE — Progress Notes (Signed)
I,Tianna Badgett,acting as a Education administrator for [employer], FNP.,have documented all relevant documentation on the behalf of [name], FNP,as directed by  [name], FNP while in the presence of [name], [place].  Subjective:     Patient ID: [name] , male    DOB: 1970-03-03 , 51 y.o.   MRN: [id number]   Chief Complaint  Patient presents with   Abdominal Pain    HPI  Patient presents today for abdominal pain. He is taking percocet for pain. Pt states that he was given this after hernia repair.   Abdominal Pain This is a recurrent problem. The current episode started 1 to 4 weeks ago. The problem occurs 2 to 4 times per day. The pain is located in the generalized abdominal region. The pain is at a severity of 7/10. The quality of the pain is sharp. The pain is relieved by Nothing. His past medical history is significant for colon cancer (history of adenocarcinoma). There is no history of Crohn's disease.     Past Medical History:  Diagnosis Date   Abdominal pain    intermittent   Allergy    SEASONAL   Anemia    Aneurysm (HCC)    brain   Asthma    Gastric cancer (HCC)    GERD (gastroesophageal reflux disease)    Pancreatitis    PTSD (post-traumatic stress disorder) 2008   Rectal polyp 01/09/2006   Hyperplastic   Seizures (HCC)    ADMITS TAKING MEDICATION FOR SEIZURE D/T ANEURYSM ,DO NOT KNOW IF HAD SEIZURE. 11/10/18     Family History  Problem Relation Age of Onset   Stroke Mother    Hypertension Mother    Diabetes Father    Pancreatic cancer Maternal Uncle    Colon cancer Maternal Uncle    Cancer Other    Diabetes Other    Colon cancer Maternal Grandfather    Colon cancer Paternal Grandfather    Pancreatic cancer Cousin    Esophageal cancer Neg Hx    Rectal cancer Neg Hx    Stomach cancer Neg Hx      Current Outpatient Medications:    oxyCODONE-acetaminophen (PERCOCET) 10-325 MG tablet, Take 1 tablet by mouth every 6 (six) hours as needed for  pain., Disp: 20 tablet, Rfl: 0   atorvastatin (LIPITOR) 10 MG tablet, Take 1 tablet by mouth at bedtime on MWF, Disp: 45 tablet, Rfl: 1   folic acid (FOLVITE) 1 MG tablet, TAKE 1 TABLET BY MOUTH EVERY DAY, Disp: 90 tablet, Rfl: 0   levETIRAcetam (KEPPRA) 500 MG tablet, Take 1 tablet (500 mg total) by mouth 2 (two) times daily., Disp: 180 tablet, Rfl: 3   Multiple Vitamin (MULTIVITAMIN WITH MINERALS) TABS tablet, Take 1 tablet by mouth daily., Disp:  , Rfl:    nicotine (NICODERM CQ - DOSED IN MG/24 HOURS) 21 mg/24hr patch, Place 1 patch (21 mg total) onto the skin daily., Disp: 30 patch, Rfl: 1   polyvinyl alcohol (LIQUIFILM TEARS) 1.4 % ophthalmic solution, Place 1 drop into both eyes as needed for dry eyes., Disp: , Rfl:    thiamine (VITAMIN B-1) 100 MG tablet, Take 1 tablet (100 mg total) by mouth daily., Disp: 90 tablet, Rfl: 3   Vitamin D, Ergocalciferol, (DRISDOL) 1.25 MG (50000 UNIT) CAPS capsule, Take 1 capsule (50,000 Units total) by mouth 2 (two) times a week., Disp: 24 capsule, Rfl: 1   Allergies  Allergen Reactions   Aspirin Anaphylaxis   Ibuprofen Anaphylaxis  Hydrocodone Hives and Itching   Tylenol [Acetaminophen]     Upset stomach     Review of Systems  Constitutional: Negative.   Respiratory: Negative.    Cardiovascular: Negative.   Gastrointestinal:  Positive for abdominal pain.  Neurological: Negative.      Today's Vitals   11/09/21 1044  BP: 122/78  Pulse: 68  Temp: 97.6 F (36.4 C)  TempSrc: Oral  Weight: 152 lb (68.9 kg)  Height: '6\' 2"'$  (1.88 m)   Body mass index is 19.52 kg/m.  Wt Readings from Last 3 Encounters:  11/09/21 152 lb (68.9 kg)  10/18/21 159 lb (72.1 kg)  08/04/21 159 lb 6.4 oz (72.3 kg)    Objective:  Physical Exam Vitals reviewed.  Constitutional:      General: He is not in acute distress.    Appearance: Normal appearance. He is normal weight.  Cardiovascular:     Rate and Rhythm: Normal rate and regular rhythm.     Pulses:  Normal pulses.     Heart sounds: Normal heart sounds. No murmur heard. Pulmonary:     Effort: Pulmonary effort is normal. No respiratory distress.     Breath sounds: Normal breath sounds. No wheezing.  Abdominal:     General: Bowel sounds are normal.     Tenderness: There is generalized abdominal tenderness.     Hernia: No hernia is present.  Musculoskeletal:        General: Normal range of motion.     Cervical back: Normal range of motion and neck supple.  Skin:    General: Skin is warm and dry.     Capillary Refill: Capillary refill takes less than 2 seconds.  Neurological:     General: No focal deficit present.     Mental Status: He is alert and oriented to person, place, and time.     Cranial Nerves: No cranial nerve deficit.  Psychiatric:        Mood and Affect: Mood normal.        Behavior: Behavior normal.        Thought Content: Thought content normal.        Judgment: Judgment normal.         Assessment And Plan:     1. Generalized abdominal pain Comments: [place] on palpation. Reviewed PDMP last fill of percocet was 1.5 yr ago. I have advised him to return call to GI for f/u not seen in 2 years. Will check CT scan abdomen as a repeat. I will also do a drug screen since refilling a narcotic - CBC with Diff - oxyCODONE-acetaminophen (PERCOCET) 10-325 MG tablet; Take 1 tablet by mouth every 6 (six) hours as needed for pain.  Dispense: 20 tablet; Refill: 0 - CT Abdomen Pelvis W Contrast; Future - Drug Profile, Ur, 9 Drugs - POCT Urinalysis Dipstick (25427)     Patient was given opportunity to ask questions. Patient verbalized understanding of the plan and was able to repeat key elements of the plan. All questions were answered to their satisfaction.  [name], FNP   I, [name], FNP, have reviewed all documentation for this visit. The documentation on 11/10/21 for the exam, diagnosis, procedures, and orders are all accurate and complete.   IF YOU HAVE  BEEN REFERRED TO A SPECIALIST, IT MAY TAKE 1-2 WEEKS TO SCHEDULE/PROCESS THE REFERRAL. IF YOU HAVE NOT HEARD FROM US/SPECIALIST IN TWO WEEKS, PLEASE GIVE [place] A CALL AT [phone].   THE PATIENT IS ENCOURAGED TO  PRACTICE SOCIAL DISTANCING DUE TO THE COVID-19 PANDEMIC.

## 2021-11-09 NOTE — Progress Notes (Signed)
He needs to cut his water down by one bottle a day.

## 2021-11-10 ENCOUNTER — Encounter: Payer: Self-pay | Admitting: Nurse Practitioner

## 2021-11-10 LAB — CBC WITH DIFFERENTIAL/PLATELET
Basophils Absolute: 0 10*3/uL (ref 0.0–0.2)
Basos: 1 %
EOS (ABSOLUTE): 0 10*3/uL (ref 0.0–0.4)
Eos: 1 %
Hematocrit: 35.7 % — ABNORMAL LOW (ref 37.5–51.0)
Hemoglobin: 12.6 g/dL — ABNORMAL LOW (ref 13.0–17.7)
Immature Grans (Abs): 0 10*3/uL (ref 0.0–0.1)
Immature Granulocytes: 0 %
Lymphocytes Absolute: 2.1 10*3/uL (ref 0.7–3.1)
Lymphs: 68 %
MCH: 31.7 pg (ref 26.6–33.0)
MCHC: 35.3 g/dL (ref 31.5–35.7)
MCV: 90 fL (ref 79–97)
Monocytes Absolute: 0.3 10*3/uL (ref 0.1–0.9)
Monocytes: 10 %
Neutrophils Absolute: 0.6 10*3/uL — ABNORMAL LOW (ref 1.4–7.0)
Neutrophils: 20 %
Platelets: 164 10*3/uL (ref 150–450)
RBC: 3.98 x10E6/uL — ABNORMAL LOW (ref 4.14–5.80)
RDW: 13.3 % (ref 11.6–15.4)
WBC: 3 10*3/uL — ABNORMAL LOW (ref 3.4–10.8)

## 2021-11-15 LAB — DRUG PROFILE, UR, 9 DRUGS (LABCORP)
Amphetamines, Urine: NEGATIVE ng/mL
Barbiturate Quant, Ur: NEGATIVE ng/mL
Benzodiazepine Quant, Ur: NEGATIVE ng/mL
Cannabinoid Quant, Ur: POSITIVE — AB
Cocaine (Metab.): NEGATIVE ng/mL
Methadone Screen, Urine: NEGATIVE ng/mL
Opiate Quant, Ur: NEGATIVE ng/mL
PCP Quant, Ur: NEGATIVE ng/mL
Propoxyphene: NEGATIVE ng/mL

## 2021-12-04 ENCOUNTER — Ambulatory Visit
Admission: RE | Admit: 2021-12-04 | Discharge: 2021-12-04 | Disposition: A | Discharge status: Home / Self Care | Payer: Medicare HMO | Source: Ambulatory Visit | Attending: Nurse Practitioner | Admitting: Nurse Practitioner

## 2021-12-04 ENCOUNTER — Other Ambulatory Visit: Payer: Self-pay

## 2021-12-04 DIAGNOSIS — Z85028 Personal history of other malignant neoplasm of stomach: Secondary | ICD-10-CM | POA: Diagnosis not present

## 2021-12-04 DIAGNOSIS — Z8507 Personal history of malignant neoplasm of pancreas: Secondary | ICD-10-CM | POA: Diagnosis not present

## 2021-12-04 DIAGNOSIS — R109 Unspecified abdominal pain: Secondary | ICD-10-CM | POA: Diagnosis not present

## 2021-12-04 DIAGNOSIS — K760 Fatty (change of) liver, not elsewhere classified: Secondary | ICD-10-CM | POA: Diagnosis not present

## 2021-12-04 DIAGNOSIS — R1084 Generalized abdominal pain: Secondary | ICD-10-CM

## 2021-12-04 MED ORDER — IOPAMIDOL (ISOVUE-300) INJECTION 61%
100.0000 mL | Freq: Once | INTRAVENOUS | Status: AC | PRN
  Administered 2021-12-04: 100 mL via INTRAVENOUS

## 2021-12-06 ENCOUNTER — Other Ambulatory Visit: Payer: Self-pay

## 2021-12-06 ENCOUNTER — Encounter (HOSPITAL_COMMUNITY): Payer: Self-pay | Admitting: Emergency Medicine

## 2021-12-06 ENCOUNTER — Emergency Department (HOSPITAL_COMMUNITY): Payer: Medicare HMO

## 2021-12-06 ENCOUNTER — Emergency Department (HOSPITAL_COMMUNITY)
Admission: EM | Admit: 2021-12-06 | Discharge: 2021-12-08 | Disposition: A | Discharge status: Home / Self Care | Payer: Medicare HMO | Attending: Emergency Medicine | Admitting: Emergency Medicine

## 2021-12-06 DIAGNOSIS — M7989 Other specified soft tissue disorders: Secondary | ICD-10-CM | POA: Diagnosis not present

## 2021-12-06 DIAGNOSIS — S62315A Displaced fracture of base of fourth metacarpal bone, left hand, initial encounter for closed fracture: Secondary | ICD-10-CM | POA: Diagnosis not present

## 2021-12-06 DIAGNOSIS — S62317A Displaced fracture of base of fifth metacarpal bone. left hand, initial encounter for closed fracture: Secondary | ICD-10-CM | POA: Insufficient documentation

## 2021-12-06 DIAGNOSIS — S62339A Displaced fracture of neck of unspecified metacarpal bone, initial encounter for closed fracture: Secondary | ICD-10-CM

## 2021-12-06 DIAGNOSIS — X58XXXA Exposure to other specified factors, initial encounter: Secondary | ICD-10-CM | POA: Diagnosis not present

## 2021-12-06 DIAGNOSIS — S62307A Unspecified fracture of fifth metacarpal bone, left hand, initial encounter for closed fracture: Secondary | ICD-10-CM | POA: Diagnosis not present

## 2021-12-06 DIAGNOSIS — S6992XA Unspecified injury of left wrist, hand and finger(s), initial encounter: Secondary | ICD-10-CM | POA: Diagnosis present
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
## 2021-12-06 NOTE — ED Triage Notes (Signed)
Pt c/o left hand pain and swelling. Pt reports injury on Sunday or Monday, when asked how injury occurred, pt states "it's a long story". Sensation intact, able to move fingers.

## 2021-12-07 ENCOUNTER — Ambulatory Visit: Payer: Self-pay

## 2021-12-07 MED ORDER — OXYCODONE-ACETAMINOPHEN 5-325 MG PO TABS
1.0000 | ORAL_TABLET | Freq: Once | ORAL | Status: AC
  Administered 2021-12-07: 1 via ORAL
  Filled 2021-12-07: qty 1

## 2021-12-07 MED ORDER — HYDROCODONE-ACETAMINOPHEN 5-325 MG PO TABS: 1.0000 | ORAL_TABLET | Freq: Three times a day (TID) | ORAL | 0 refills | Status: AC | PRN
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
## 2021-12-07 NOTE — Progress Notes (Signed)
Orthopedic Tech Progress Note Patient Details:  [name] 1970-08-26 [id number]  Ortho Devices Type of Ortho Device: Ulna gutter splint Ortho Device/Splint Location: RUE Ortho Device/Splint Interventions: Application, Ordered   Post Interventions Patient Tolerated: Well Instructions Provided: Poper ambulation with device  [name] 12/07/2021, 12:32 PM

## 2021-12-07 NOTE — Patient Outreach (Signed)
  Care Coordination    Case Review Note    12/07/2021 Name: [name] MRN: [id number] DOB: Jan 15, 1971  [name] is a 51 y.o. year old male who sees Robert Brine, FNP for primary care.  The  multidisciplinary care team met today to review patient care needs and barriers.     Goals Addressed             This Visit's Progress    COMPLETED: Care Coordination Activities       Care Coordination Interventions: Performed chart review to note patient is currently in the ED due to a hand injury Collaboration with RN Care Manager to advise of patients current disposition. Patient has a future call scheduled with RN Care Manager on 11/20         SDOH assessments and interventions completed:  No     Care Coordination Interventions Activated:  Yes   Care Coordination Interventions:  Yes, provided   Follow up plan: Follow up call scheduled for 11/20 with [name], [name] [name] Management  Care Coordination [phone]

## 2021-12-08 ENCOUNTER — Telehealth: Payer: Self-pay

## 2021-12-08 NOTE — Telephone Encounter (Signed)
Transition Care Management Unsuccessful Follow-up Telephone Call  Date of discharge and from where:  12/08/2021 [place]   Attempts:  1st Attempt  Reason for unsuccessful TCM follow-up call:  Left voice message

## 2021-12-11 ENCOUNTER — Telehealth: Payer: Self-pay

## 2021-12-11 NOTE — Telephone Encounter (Signed)
Transition Care Management Follow-up Telephone Call Date of discharge and from where: 12/08/2021 [facility]  How have you been since you were released from the hospital? Pt states he does better during the day. At night, he states his left hand starts to throb.  Any questions or concerns? No  Items Reviewed: Did the pt receive and understand the discharge instructions provided? Yes  Medications obtained and verified? Yes  Other? Yes  Any new allergies since your discharge? No  Dietary orders reviewed? yes Do you have support at home? Yes   Home Care and Equipment/Supplies: Were home health services ordered? no If so, what is the name of the agency? N/a  Has the agency set up a time to come to the patient's home? no Were any new equipment or medical supplies ordered?  No What is the name of the medical supply agency? N/a Were you able to get the supplies/equipment? no Do you have any questions related to the use of the equipment or supplies? No  Functional Questionnaire: (I = Independent and D = Dependent) ADLs: i  Bathing/Dressing- i  Meal Prep- i  Eating- i  Maintaining continence- i  Transferring/Ambulation- ii  Managing Meds- i  Follow up appointments reviewed:  PCP Hospital f/u appt confirmed? No  Scheduled to see janece moore  on n/a @ n/a. [facility] f/u appt confirmed? Yes  Scheduled to see orthopedic, patient stated he has to wait for an apt with them they are confirming some things with the ED, on n/a  @ n/a. Are transportation arrangements needed? No  If their condition worsens, is the pt aware to call PCP or go to the Emergency Dept.? Yes Was the patient provided with contact information for the PCP's office or ED? Yes Was to pt encouraged to call back with questions or concerns? Yes

## 2021-12-12 DIAGNOSIS — S62337A Displaced fracture of neck of fifth metacarpal bone, left hand, initial encounter for closed fracture: Secondary | ICD-10-CM | POA: Diagnosis not present

## 2021-12-12 DIAGNOSIS — S62339A Displaced fracture of neck of unspecified metacarpal bone, initial encounter for closed fracture: Secondary | ICD-10-CM | POA: Diagnosis not present

## 2021-12-12 DIAGNOSIS — S62315A Displaced fracture of base of fourth metacarpal bone, left hand, initial encounter for closed fracture: Secondary | ICD-10-CM | POA: Diagnosis not present

## 2021-12-26 DIAGNOSIS — S62339A Displaced fracture of neck of unspecified metacarpal bone, initial encounter for closed fracture: Secondary | ICD-10-CM | POA: Diagnosis not present

## 2021-12-26 DIAGNOSIS — S62315A Displaced fracture of base of fourth metacarpal bone, left hand, initial encounter for closed fracture: Secondary | ICD-10-CM | POA: Diagnosis not present

## 2021-12-27 ENCOUNTER — Ambulatory Visit: Payer: Self-pay

## 2021-12-27 NOTE — Patient Instructions (Signed)
Visit Information  Thank you for taking time to visit with me today. Please don't hesitate to contact me if I can be of assistance to you.   Following are the goals we discussed today:   Goals Addressed             This Visit's Progress    I have a hand injury       Care Coordination Interventions: Evaluation of current treatment plan related to Left hand boxer's fracture and patient's adherence to plan as established by provider  Review of patient status, including review of consultant's reports, relevant laboratory and other test results, and medications completed Encouraged patient to adhere to his orthopedic recommendations and notify his doctor of new symptoms or concerns promptly       COMPLETED: I would like to know more about Aortic Atherosclerosis       Care Coordination Interventions: Determined patient will consider participation in the [facility] exercise program following the recovery of his hand fracture Discussed patient received the educational materials related Aortic Atherosclerosis but has not read the material     COMPLETED: To stay Seizure free       Care Coordination Interventions: Evaluation of current treatment plan related to Seizure disorder, self-management and patient's adherence to plan as established by provider Determined patient has remained Seizure free since last RN CC follow up Discussed patient is aware of triggers and avoids these triggers to help manage his Seizure activity Instructed patient to notify his doctor of any new symptoms or concerns promptly        Our next appointment is by telephone on 02/26/22 at 1230 PM   Please call the care guide team at [phone] if you need to cancel or reschedule your appointment.   If you are experiencing a Mental Health or [place] or need someone to talk to, please call 1-800-273-TALK (toll free, 24 hour hotline)  The patient verbalized understanding of instructions, educational  materials, and care plan provided today and agreed to receive a mailed copy of patient instructions, educational materials, and care plan.   Barb Merino, RN, BSN, CCM Care Management Coordinator [facility] Care Management Direct Phone: [phone]

## 2021-12-27 NOTE — Patient Outreach (Signed)
  Care Coordination   Follow Up Visit Note   12/27/2021 Name: [name] MRN: [id number] DOB: 1970-09-26  [name] is a 50 y.o. year old male who sees Robert Brine, FNP for primary care. I spoke with  [name] by phone today.  What matters to the patients health and wellness today?  Patient is recovering from a recent hand fracture that may require surgery.     Goals Addressed             This Visit's Progress    I have a hand injury       Care Coordination Interventions: Evaluation of current treatment plan related to Left hand boxer's fracture and patient's adherence to plan as established by provider  Review of patient status, including review of consultant's reports, relevant laboratory and other test results, and medications completed Encouraged patient to adhere to his orthopedic recommendations and notify his doctor of new symptoms or concerns promptly       COMPLETED: I would like to know more about Aortic Atherosclerosis       Care Coordination Interventions: Determined patient will consider participation in the [facility] exercise program following the recovery of his hand fracture Discussed patient received the educational materials related Aortic Atherosclerosis but has not read the material     COMPLETED: To stay Seizure free       Care Coordination Interventions: Evaluation of current treatment plan related to Seizure disorder, self-management and patient's adherence to plan as established by provider Determined patient has remained Seizure free since last RN CC follow up Discussed patient is aware of triggers and avoids these triggers to help manage his Seizure activity Instructed patient to notify his doctor of any new symptoms or concerns promptly        SDOH assessments and interventions completed:  No     Care Coordination Interventions Activated:  Yes  Care Coordination Interventions:  Yes, provided   Follow up plan: Follow up  call scheduled for 02/27/23 1230 PM     Encounter Outcome:  Pt. Visit Completed

## 2022-02-20 DIAGNOSIS — S62339G Displaced fracture of neck of unspecified metacarpal bone, subsequent encounter for fracture with delayed healing: Secondary | ICD-10-CM | POA: Diagnosis not present

## 2022-02-20 DIAGNOSIS — S62315A Displaced fracture of base of fourth metacarpal bone, left hand, initial encounter for closed fracture: Secondary | ICD-10-CM | POA: Diagnosis not present

## 2022-02-20 NOTE — Telephone Encounter (Signed)
Chmg-error.  

## 2022-02-22 ENCOUNTER — Encounter: Payer: Medicare HMO | Admitting: Nurse Practitioner

## 2022-03-19 ENCOUNTER — Ambulatory Visit: Payer: Self-pay

## 2022-03-19 NOTE — Patient Outreach (Signed)
  Care Coordination   Follow Up Visit Note   03/19/2022 Name: [name] MRN: [id number] DOB: 1970-05-31  [name] is a 52 y.o. year old male who sees Robert Brine, FNP for primary care. I spoke with  [name] by phone today.  What matters to the patients health and wellness today?  Patient continues to adhere to his Orthopedic plan of care for the treatment of his left metacarpal fractures. Patient needs help with resources for housing due to he is currently homeless.     Goals Addressed             This Visit's Progress    I have a hand injury       Care Coordination Interventions: Evaluation of current treatment plan related to Left hand boxer's fracture and patient's adherence to plan as established by provider  Determined patient continues to have slow bone healing secondary to a left 4th/5th metacarpal fractures Reviewed and discussed recent Ortho follow up and MD recommendations Determined patient is adhering to his plan of care and will continue to f/u with Orthopedics as directed     RN CC Activities - further follow up needed       Care Coordination Interventions: Determined patient is currently homeless, he is requesting to speak with a SW to receive any available resources that may help with housing Active listening / Reflection utilized  Emotional Support Provided Made referral to [place] for assistance with housing solutions         SDOH assessments and interventions completed:  Yes  SDOH Interventions Today    Flowsheet Row Most Recent Value  SDOH Interventions   Housing Interventions AMB Referral  [Referral sent to [place] Coordination Interventions:  Yes, provided   Follow up plan: Referral made to [place] to provide resources for housing  Follow up call scheduled for 04/17/22 '@11'$ :00 AM    Encounter Outcome:  Pt. Visit Completed

## 2022-03-19 NOTE — Patient Instructions (Signed)
Visit Information  Thank you for taking time to visit with me today. Please don't hesitate to contact me if I can be of assistance to you.   Following are the goals we discussed today:   Goals Addressed             This Visit's Progress    I have a hand injury       Care Coordination Interventions: Evaluation of current treatment plan related to Left hand boxer's fracture and patient's adherence to plan as established by provider  Determined patient continues to have slow bone healing secondary to a left 4th/5th metacarpal fractures Reviewed and discussed recent Ortho follow up and MD recommendations Determined patient is adhering to his plan of care and will continue to f/u with Orthopedics as directed     RN CC Activities - further follow up needed       Care Coordination Interventions: Determined patient is currently homeless, he is requesting to speak with a SW to receive any available resources that may help with housing Active listening / Reflection utilized  Emotional Support Provided Made referral to [place] for assistance with housing solutions         Our next appointment is by telephone on 04/17/22 at 11:00 AM  Please call the care guide team at [phone] if you need to cancel or reschedule your appointment.   If you are experiencing a Mental Health or [place] or need someone to talk to, please go to [facility] [address] [phone])  The patient verbalized understanding of instructions, educational materials, and care plan provided today and DECLINED offer to receive copy of patient instructions, educational materials, and care plan.   Barb Merino, RN, BSN, CCM Care Management Coordinator [facility] Care Management  Direct Phone: [phone]

## 2022-03-19 NOTE — Patient Instructions (Signed)
Visit Information  Thank you for taking time to visit with me today. Please don't hesitate to contact me if I can be of assistance to you.   Following are the goals we discussed today:   Goals Addressed             This Visit's Progress    Care Coordination Activities       Care Coordination Interventions: Collaboration with RN Care Manager who requests SW assistance with housing instability Determined the patient has lost his housing and is staying with a friend Discussed patient feels this is not a long-term option and he will need to find his own place quickly Advised the patient to contact the [employer] Homelessness Prevention program at [phone] Provided patient with websites to review for available housing options offered through property management offices within the [place]         If you are experiencing a Mental Health or [place] or need someone to talk to, please go to [facility] [address], [place] [phone]) call 911  The patient verbalized understanding of instructions, educational materials, and care plan provided today and DECLINED offer to receive copy of patient instructions, educational materials, and care plan.   [name], BSW, CDP Social Worker, Certified Dementia Practitioner [place] Management  Care Coordination [phone]

## 2022-03-19 NOTE — Patient Outreach (Signed)
  Care Coordination   Follow Up Visit Note   03/19/2022 Name: [name] MRN: [id number] DOB: Jun 13, 1970  [name] is a 52 y.o. year old male who sees Robert Brine, FNP for primary care. I spoke with  [name] by phone today.  What matters to the patients health and wellness today?  Housing    Goals Addressed             This Visit's Progress    Care Coordination Activities       Care Coordination Interventions: Collaboration with RN Care Manager who requests SW assistance with housing instability Determined the patient has lost his housing and is staying with a friend Discussed patient feels this is not a long-term option and he will need to find his own place quickly Advised the patient to contact the [place] program at [phone] Provided patient with websites to review for available housing options offered through property management offices within the [place]         SDOH assessments and interventions completed:  Yes  SDOH Interventions Today    Flowsheet Row Most Recent Value  SDOH Interventions   Housing Interventions Other (Comment)  [place],  provided list of affordable apartments available]        Care Coordination Interventions:  Yes, provided   Interventions Today    Flowsheet Row Most Recent Value  Chronic Disease Discussed/Reviewed   Chronic disease discussed/reviewed during today's visit Other  [Asthma, Seizures]  General Interventions   General Interventions Discussed/Reviewed General Interventions Discussed, Transport planner Provided [employer], Provided Verbal Education  Provided Verbal Education On --  [Housing Resources]        Follow up plan:  SW will continue to follow.    Encounter Outcome:  Pt. Visit Completed   [name], BSW, CDP Social Worker,  Certified Dementia Practitioner [place] Management  Care Coordination [phone]

## 2022-03-20 ENCOUNTER — Ambulatory Visit: Payer: Self-pay

## 2022-03-20 NOTE — Patient Instructions (Signed)
Visit Information  Thank you for taking time to visit with me today. Please don't hesitate to contact me if I can be of assistance to you.   Following are the goals we discussed today:   Goals Addressed             This Visit's Progress    Care Coordination Activities       Care Coordination Interventions: Confirmed receipt of resources provided to the patient via e-mail correspondence Determined the patient has not yet reached out to resources but will today Encouraged the patient to contact [employer] first to get set up with a case manager to help locate housing through the homelessness prevention program Reviewed patient is staying with a friend but this is not a long term option and patient needs to move a soon as possible Advised the patient SW will follow up with him over the next week          Our next appointment is by telephone on 2/7 at 11:00 am  Please call the care guide team at [phone] if you need to cancel or reschedule your appointment.   If you are experiencing a Mental Health or [place] or need someone to talk to, please call 911  The patient verbalized understanding of instructions, educational materials, and care plan provided today and DECLINED offer to receive copy of patient instructions, educational materials, and care plan.   Telephone follow up appointment with care management team member scheduled for:2/7  [name], Arita Miss, CDP Social Worker, Certified Dementia Practitioner [place] Management  Care Coordination [phone]

## 2022-03-20 NOTE — Patient Outreach (Signed)
  Care Coordination   Follow Up Visit Note   03/20/2022 Name: Kazden Largo MRN: [id number] DOB: 01-20-1971  Akim Antwyne Pingree is a 52 y.o. year old male who sees [name], FNP for primary care. I spoke with  Shota Earley Brooke by phone today.  What matters to the patients health and wellness today?  I need to find a place to live    Goals Addressed             This Visit's Progress    Care Coordination Activities       Care Coordination Interventions: Confirmed receipt of resources provided to the patient via e-mail correspondence Determined the patient has not yet reached out to resources but will today Encouraged the patient to contact [employer] first to get set up with a case manager to help locate housing through the homelessness prevention program Reviewed patient is staying with a friend but this is not a long term option and patient needs to move a soon as possible Advised the patient SW will follow up with him over the next week          SDOH assessments and interventions completed:  No     Care Coordination Interventions:  Yes, provided   Interventions Today    Flowsheet Row Most Recent Value  Chronic Disease Discussed/Reviewed   Chronic disease discussed/reviewed during today's visit Other  [Asthma, Seizures]  General Interventions   General Interventions Discussed/Reviewed General Interventions Reviewed, Community Resources       Follow up plan: Follow up call scheduled for 2/12    Encounter Outcome:  Pt. Visit Completed   [name], Arita Miss, CDP Social Worker, Certified Dementia Practitioner [facility]  Care Coordination [phone]

## 2022-03-26 ENCOUNTER — Telehealth: Payer: Self-pay

## 2022-03-26 NOTE — Patient Outreach (Signed)
  Care Coordination   03/26/2022 Name: Daeton Kluth MRN: [id number] DOB: 1970-03-27   Care Coordination Outreach Attempts:  An unsuccessful telephone outreach was attempted for a scheduled appointment today.  Follow Up Plan:  Additional outreach attempts will be made to offer the patient care coordination information and services.   Encounter Outcome:  No Answer   Care Coordination Interventions:  No, not indicated    [name], BSW, CDP Social Worker, Certified Dementia Practitioner [facility]  Care Coordination [phone]

## 2022-03-28 ENCOUNTER — Ambulatory Visit: Payer: Self-pay

## 2022-03-28 NOTE — Patient Instructions (Signed)
Visit Information  Thank you for taking time to visit with me today. Please don't hesitate to contact me if I can be of assistance to you.   Following are the goals we discussed today:   Goals Addressed             This Visit's Progress    COMPLETED: Care Coordination Activities       Care Coordination Interventions: Discussed patient has attempted to contact a few housing resource options but has not been able to locate a place to live Patient remains at friends home and will continue calling housing resources intermittently until he is able to locate an affordable option Discussed plan for SW to sign off at this time with option for patient to contact SW as needed Collaboration with [place] regarding interventions and plan for SW to sign off at this time          Your next appointment is by telephone on 3/5 with [facility].  Please call the care guide team at [phone] if you need to cancel or reschedule your appointment.   If you are experiencing a Mental Health or [place] or need someone to talk to, please call 911  The patient verbalized understanding of instructions, educational materials, and care plan provided today and DECLINED offer to receive copy of patient instructions, educational materials, and care plan.   Telephone follow up appointment with care management team member scheduled for:3/5 with [place].  [name], BSW, CDP Social Worker, Certified Dementia Practitioner [facility]  Care Coordination [phone]

## 2022-03-28 NOTE — Patient Outreach (Signed)
  Care Coordination   Follow Up Visit Note   03/28/2022 Name: Aryaan Persichetti MRN: [id number] DOB: 20-May-1970  Paiden Prathik Aman is a 52 y.o. year old male who sees [name], FNP for primary care. I spoke with  Fayez Earley Brooke by phone today.  What matters to the patients health and wellness today?  To find stable housing    Goals Addressed             This Visit's Progress    COMPLETED: Care Coordination Activities       Care Coordination Interventions: Discussed patient has attempted to contact a few housing resource options but has not been able to locate a place to live Patient remains at friends home and will continue calling housing resources intermittently until he is able to locate an affordable option Discussed plan for SW to sign off at this time with option for patient to contact SW as needed Collaboration with RN Care Manager regarding interventions and plan for SW to sign off at this time          SDOH assessments and interventions completed:  No     Care Coordination Interventions:  Yes, provided   Interventions Today    Flowsheet Row Most Recent Value  Chronic Disease   Chronic disease during today's visit Other  [Asthma, Seizures]  General Interventions   General Interventions Discussed/Reviewed General Interventions Reviewed, Community Resources  [Housing]       Follow up plan: No further intervention required. The patient will remain engaged with RN Care Manager to address care coordination needs.    Encounter Outcome:  Pt. Visit Completed   [name], BSW, CDP Social Worker, Certified Dementia Practitioner [facility]  Care Coordination [phone]

## 2022-04-17 ENCOUNTER — Ambulatory Visit: Payer: Self-pay

## 2022-04-17 NOTE — Patient Outreach (Signed)
  Care Coordination   Follow Up Visit Note   04/17/2022 Name: [name] MRN: [id number] DOB: [birth year]  [name] is a 52 y.o. year old male who sees [name], FNP for primary care. I spoke with  [name] by phone today.  What matters to the patients health and wellness today?  Patient continues to be homeless, he is living in his car.     Goals Addressed             This Visit's Progress    RN CC Activities - further follow up needed       Care Coordination Interventions: Completed outbound call to patient Determined patient continues to be homeless, he is living in his car Determined patient needs to end the call but states he will call this RN CC back at a later time          SDOH assessments and interventions completed:  No     Care Coordination Interventions:  Yes, provided   Follow up plan:  [facility] will reschedule nurse call with patient     Encounter Outcome:  Pt. Visit Completed

## 2022-04-18 ENCOUNTER — Telehealth: Payer: Self-pay | Admitting: *Deleted

## 2022-04-18 NOTE — Progress Notes (Signed)
  Care Coordination Note  04/18/2022 Name: [name] MRN: [id number] DOB: 1970/12/17  [name] is a 52 y.o. year old male who is a primary care patient of [name], [place] and is actively engaged with the care management team. I reached out to [employer] by phone today to assist with re-scheduling a follow up visit with the RN Case Manager  Follow up plan: Unsuccessful telephone outreach attempt made.   [place]  Direct Dial: [phone]

## 2022-04-23 NOTE — Progress Notes (Signed)
  Care Coordination Note  04/23/2022 Name: [name] MRN: [id number] DOB: [birth year]  [name] is a 52 y.o. year old male who is a primary care patient of [name], [place] and is actively engaged with the care management team. I reached out to [employer] by phone today to assist with re-scheduling a follow up visit with the RN Case Manager  Follow up plan: Telephone appointment with care management team member scheduled for:05/08/22  [place]  Direct Dial: [phone]

## 2022-04-30 ENCOUNTER — Other Ambulatory Visit: Payer: Self-pay

## 2022-04-30 ENCOUNTER — Emergency Department (HOSPITAL_COMMUNITY): Payer: Medicare HMO

## 2022-04-30 ENCOUNTER — Emergency Department (HOSPITAL_COMMUNITY)
Admission: EM | Admit: 2022-04-30 | Discharge: 2022-05-01 | Disposition: A | Discharge status: Home / Self Care | Payer: Medicare HMO | Attending: Student | Admitting: Student

## 2022-04-30 DIAGNOSIS — J45909 Unspecified asthma, uncomplicated: Secondary | ICD-10-CM | POA: Insufficient documentation

## 2022-04-30 DIAGNOSIS — E871 Hypo-osmolality and hyponatremia: Secondary | ICD-10-CM | POA: Insufficient documentation

## 2022-04-30 DIAGNOSIS — I959 Hypotension, unspecified: Secondary | ICD-10-CM | POA: Diagnosis not present

## 2022-04-30 DIAGNOSIS — R5383 Other fatigue: Secondary | ICD-10-CM | POA: Diagnosis not present

## 2022-04-30 DIAGNOSIS — Z85028 Personal history of other malignant neoplasm of stomach: Secondary | ICD-10-CM | POA: Insufficient documentation

## 2022-04-30 DIAGNOSIS — R4182 Altered mental status, unspecified: Secondary | ICD-10-CM | POA: Insufficient documentation

## 2022-04-30 DIAGNOSIS — R55 Syncope and collapse: Secondary | ICD-10-CM | POA: Diagnosis not present

## 2022-04-30 DIAGNOSIS — R519 Headache, unspecified: Secondary | ICD-10-CM | POA: Diagnosis not present

## 2022-04-30 DIAGNOSIS — G4489 Other headache syndrome: Secondary | ICD-10-CM | POA: Diagnosis not present

## 2022-04-30 DIAGNOSIS — F17210 Nicotine dependence, cigarettes, uncomplicated: Secondary | ICD-10-CM | POA: Diagnosis not present

## 2022-04-30 DIAGNOSIS — R000 Tachycardia, unspecified: Secondary | ICD-10-CM | POA: Diagnosis not present

## 2022-04-30 LAB — COMPREHENSIVE METABOLIC PANEL
ALT: 17 U/L (ref 0–44)
AST: 30 U/L (ref 15–41)
Albumin: 2.6 g/dL — ABNORMAL LOW (ref 3.5–5.0)
Alkaline Phosphatase: 44 U/L (ref 38–126)
Anion gap: 10 (ref 5–15)
BUN: 13 mg/dL (ref 6–20)
CO2: 21 mmol/L — ABNORMAL LOW (ref 22–32)
Calcium: 7.8 mg/dL — ABNORMAL LOW (ref 8.9–10.3)
Chloride: 95 mmol/L — ABNORMAL LOW (ref 98–111)
Creatinine, Ser: 0.9 mg/dL (ref 0.61–1.24)
GFR, Estimated: >60 mL/min (ref >60)
Glucose, Bld: 93 mg/dL (ref 70–99)
Potassium: 3.7 mmol/L (ref 3.5–5.1)
Sodium: 126 mmol/L — ABNORMAL LOW (ref 135–145)
Total Bilirubin: 0.8 mg/dL (ref 0.3–1.2)
Total Protein: 5.6 g/dL — ABNORMAL LOW (ref 6.5–8.1)

## 2022-04-30 LAB — I-STAT CHEM 8, ED
BUN: 12 mg/dL (ref 6–20)
BUN: 19 mg/dL (ref 6–20)
Calcium, Ion: 0.89 mmol/L — CL (ref 1.15–1.40)
Calcium, Ion: 1.05 mmol/L — ABNORMAL LOW (ref 1.15–1.40)
Chloride: 95 mmol/L — ABNORMAL LOW (ref 98–111)
Chloride: 95 mmol/L — ABNORMAL LOW (ref 98–111)
Creatinine, Ser: 0.8 mg/dL (ref 0.61–1.24)
Creatinine, Ser: 1 mg/dL (ref 0.61–1.24)
Glucose, Bld: 104 mg/dL — ABNORMAL HIGH (ref 70–99)
Glucose, Bld: 90 mg/dL (ref 70–99)
HCT: 28 % — ABNORMAL LOW (ref 39.0–52.0)
HCT: 30 % — ABNORMAL LOW (ref 39.0–52.0)
Hemoglobin: 10.2 g/dL — ABNORMAL LOW (ref 13.0–17.0)
Hemoglobin: 9.5 g/dL — ABNORMAL LOW (ref 13.0–17.0)
Potassium: 3.7 mmol/L (ref 3.5–5.1)
Potassium: 7.6 mmol/L (ref 3.5–5.1)
Sodium: 122 mmol/L — ABNORMAL LOW (ref 135–145)
Sodium: 127 mmol/L — ABNORMAL LOW (ref 135–145)
TCO2: 21 mmol/L — ABNORMAL LOW (ref 22–32)
TCO2: 24 mmol/L (ref 22–32)

## 2022-04-30 LAB — CBC WITH DIFFERENTIAL/PLATELET
Abs Immature Granulocytes: 0.01 10*3/uL (ref 0.00–0.07)
Basophils Absolute: 0 10*3/uL (ref 0.0–0.1)
Basophils Relative: 0 %
Eosinophils Absolute: 0 10*3/uL (ref 0.0–0.5)
Eosinophils Relative: 0 %
HCT: 26.4 % — ABNORMAL LOW (ref 39.0–52.0)
Hemoglobin: 9.2 g/dL — ABNORMAL LOW (ref 13.0–17.0)
Immature Granulocytes: 0 %
Lymphocytes Relative: 27 %
Lymphs Abs: 0.8 10*3/uL (ref 0.7–4.0)
MCH: 31.2 pg (ref 26.0–34.0)
MCHC: 34.8 g/dL (ref 30.0–36.0)
MCV: 89.5 fL (ref 80.0–100.0)
Monocytes Absolute: 0.3 10*3/uL (ref 0.1–1.0)
Monocytes Relative: 8 %
Neutro Abs: 2 10*3/uL (ref 1.7–7.7)
Neutrophils Relative %: 65 %
Platelets: 226 10*3/uL (ref 150–400)
RBC: 2.95 MIL/uL — ABNORMAL LOW (ref 4.22–5.81)
RDW: 13.8 % (ref 11.5–15.5)
WBC: 3.1 10*3/uL — ABNORMAL LOW (ref 4.0–10.5)
nRBC: 0 % (ref 0.0–0.2)

## 2022-04-30 LAB — URINALYSIS, ROUTINE W REFLEX MICROSCOPIC
Bilirubin Urine: NEGATIVE
Glucose, UA: NEGATIVE mg/dL
Hgb urine dipstick: NEGATIVE
Ketones, ur: 5 mg/dL — AB
Nitrite: NEGATIVE
Protein, ur: NEGATIVE mg/dL
Specific Gravity, Urine: 1.012 (ref 1.005–1.030)
pH: 5 (ref 5.0–8.0)

## 2022-04-30 LAB — TROPONIN I (HIGH SENSITIVITY): Troponin I (High Sensitivity): 10 ng/L (ref <18)

## 2022-04-30 MED ORDER — LORAZEPAM 2 MG/ML IJ SOLN
2.0000 mg | Freq: Once | INTRAMUSCULAR | Status: AC
  Administered 2022-04-30: 2 mg via INTRAVENOUS
  Filled 2022-04-30: qty 1

## 2022-04-30 MED ORDER — LORAZEPAM 2 MG/ML PO CONC: 2.0000 mg | Freq: Once | ORAL | Status: DC

## 2022-04-30 MED ORDER — LACTATED RINGERS IV BOLUS
1000.0000 mL | Freq: Once | INTRAVENOUS | Status: AC
  Administered 2022-04-30: 1000 mL via INTRAVENOUS

## 2022-04-30 MED ORDER — PROCHLORPERAZINE EDISYLATE 10 MG/2ML IJ SOLN
10.0000 mg | Freq: Once | INTRAMUSCULAR | Status: AC
  Administered 2022-04-30: 10 mg via INTRAVENOUS
  Filled 2022-04-30: qty 2

## 2022-04-30 MED ORDER — LEVETIRACETAM IN NACL 1500 MG/100ML IV SOLN
1500.0000 mg | Freq: Once | INTRAVENOUS | Status: AC
  Administered 2022-04-30: 1500 mg via INTRAVENOUS
  Filled 2022-04-30: qty 100

## 2022-04-30 NOTE — ED Provider Notes (Signed)
[facility] Provider Note  CSN: [id number] Arrival date & time: 04/30/22 2038  Chief Complaint(s) No chief complaint on file.  HPI Seydou Thelonious Oglesbee is a 52 y.o. male with PMH brain aneurysm, asthma, gastric cancer, pancreatitis, seizures on Keppra who presents emergency department for evaluation of altered mental status and seizure.   Past Medical History Past Medical History:  Diagnosis Date   Abdominal pain    intermittent   Allergy    SEASONAL   Anemia    Aneurysm (HCC)    brain   Asthma    Gastric cancer ([place])    GERD (gastroesophageal reflux disease)    Pancreatitis    PTSD (post-traumatic stress disorder) 2008   Rectal polyp 01/09/2006   Hyperplastic   Seizures ([place])    ADMITS TAKING MEDICATION FOR SEIZURE D/T ANEURYSM ,DO NOT KNOW IF HAD SEIZURE. 11/10/18   Patient Active Problem List   Diagnosis Date Noted   Personal history of stomach cancer 04/19/2019   Gastric cancer s/p distal gastrectomy 03/25/2019 03/25/2019   Personal history of colonic polyps 01/29/2019   History of pancreatitis 11/26/2017   Hyponatremia    Partial seizure ([place]) 03/05/2015   History of seizures 03/05/2015   Abdominal pain, chronic, epigastric 07/02/2014   ABDOMINAL PAIN, GENERALIZED 03/07/2010   KNEE PAIN, RIGHT 11/18/2009   HYPERGLYCEMIA 09/08/2009   ANKLE SPRAIN, LEFT 02/05/2009   BACK STRAIN, LUMBAR 02/05/2009   POLYURIA 10/28/2008   ABDOMINAL PAIN, EPIGASTRIC 10/28/2008   INSOMNIA, CHRONIC 10/10/2007   DEPRESSION, CHRONIC 10/10/2007   Asthma 10/10/2007   WOUND INFECTION 10/10/2007   History of alcohol abuse 04/15/2007   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 Authorizing Provider  atorvastatin (LIPITOR) 10 MG tablet Take 1 tablet by mouth at bedtime on  MWF Patient taking differently: Take 10 mg by mouth See admin instructions. Take 1 tablet by mouth at bedtime on Monday Wednesdays and Fridays only per patient 06/04/21   [name], FNP  folic acid (FOLVITE) 1 MG tablet TAKE 1 TABLET BY MOUTH EVERY DAY Patient taking differently: Take 1 mg by mouth daily. 09/24/19   [name], FNP  levETIRAcetam (KEPPRA) 500 MG tablet Take 1 tablet (500 mg total) by mouth 2 (two) times daily. 08/04/21   Cameron Sprang, MD  Multiple Vitamin (MULTIVITAMIN WITH MINERALS) TABS tablet Take 1 tablet by mouth daily. 03/31/19   Maczis, Barth Kirks, PA-C  nicotine (NICODERM CQ - DOSED IN MG/24 HOURS) 21 mg/24hr patch Place 1 patch (21 mg total) onto the skin daily. Patient not taking: Reported on 12/06/2021 10/18/21 10/18/22  [name], FNP  oxyCODONE-acetaminophen (PERCOCET) 10-325 MG tablet Take 1 tablet by mouth every 6 (six) hours as needed for pain. 11/09/21 11/09/22  [name], FNP  polyvinyl alcohol (LIQUIFILM TEARS) 1.4 % ophthalmic solution Place 1 drop into both eyes as needed for dry eyes.    [provider]  thiamine (VITAMIN B-1) 100 MG tablet Take 1 tablet (100 mg total) by mouth daily. 08/04/21   Cameron Sprang, MD  Vitamin D, Ergocalciferol, (DRISDOL) 1.25 MG (50000 UNIT) CAPS capsule Take 1 capsule (50,000 Units total) by mouth 2 (two) times a week. Patient taking differently: Take 50,000 Units by mouth See admin instructions. Take one tablet by mouth on Mondays and Fridays per patient 04/28/20  [name], FNP                                                                                                                                    Past Surgical History Past Surgical History:  Procedure Laterality Date   arm surgery Left    repair of tendons "work injury cut"-"pinky finger numb"   BURR HOLE Right 02/28/2015   Procedure: BURR HOLES FOR SUBDURAL HEMATOMA;  Surgeon: Newman Pies, MD;  Location: [facility];  Service: Neurosurgery;   Laterality: Right;   CEREBRAL ANEURYSM REPAIR     COLONOSCOPY  2016   colon polyps   ESOPHAGOGASTRODUODENOSCOPY (EGD) WITH PROPOFOL N/A 01/13/2015   Procedure: ESOPHAGOGASTRODUODENOSCOPY (EGD) WITH PROPOFOL;  Surgeon: [name], MD;  Location: [facility];  Service: Endoscopy;  Laterality: N/A;   ESOPHAGOGASTRODUODENOSCOPY (EGD) WITH PROPOFOL N/A 03/19/2019   Procedure: ESOPHAGOGASTRODUODENOSCOPY (EGD) WITH PROPOFOL;  Surgeon: [name], MD;  Location: [facility];  Service: Endoscopy;  Laterality: N/A;   ESOPHAGOGASTRODUODENOSCOPY ENDOSCOPY     EUS N/A 03/19/2019   Procedure: UPPER ENDOSCOPIC ULTRASOUND (EUS) RADIAL;  Surgeon: [name], MD;  Location: [facility];  Service: Endoscopy;  Laterality: N/A;   HERNIA REPAIR     Umbilical   LAPAROSCOPIC GASTRECTOMY  03/25/2019   LAPAROSCOPY DIAGNOSTIC (N/A )   LAPAROSCOPY N/A 03/25/2019   Procedure: LAPAROSCOPY DIAGNOSTIC;  Surgeon: [name], MD;  Location: [facility];  Service: General;  Laterality: N/A;   LYMPH NODE BIOPSY N/A 03/25/2019   Procedure: Gastric Lymph Node Biopsy;  Surgeon: [name], MD;  Location: [facility];  Service: General;  Laterality: N/A;   PARTIAL GASTRECTOMY N/A 03/25/2019   Procedure: DISTAL SUBTOTAL GASTRECTOMY;  Surgeon: [name], MD;  Location: [facility];  Service: General;  Laterality: N/A;   SUBMUCOSAL INJECTION  03/19/2019   Procedure: SUBMUCOSAL INJECTION;  Surgeon: [name], MD;  Location: [facility];  Service: Endoscopy;;   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     UPPER GASTROINTESTINAL ENDOSCOPY     Family History Family History  Problem Relation Age of Onset   Stroke Mother    Hypertension Mother    Diabetes Father    Pancreatic cancer Maternal Uncle    Colon cancer Maternal Uncle    Cancer Other    Diabetes Other    Colon cancer Maternal Grandfather    Colon cancer Paternal Grandfather    Pancreatic cancer Cousin    Esophageal cancer Neg Hx    Rectal  cancer Neg Hx    Stomach cancer Neg Hx     Social History Social History   Tobacco Use   Smoking status: Some Days    Packs/day: 0    Types: Cigars, Cigarettes    Last attempt to quit: 03/2019    Years since quitting: 3.1   Smokeless tobacco: Never   Tobacco comments:    smokes a black and  mild daily, reports smoking 2 cigarettes a day.   Vaping Use   Vaping Use: Never used  Substance Use Topics   Alcohol use: No   Drug use: Yes    Types: Marijuana    Comment: last use 3 days ago per per 08-07-19   Allergies Aspirin, Ibuprofen, Hydrocodone, and Tylenol [acetaminophen]  Review of Systems Review of Systems *** Physical Exam Vital Signs  I have reviewed the triage vital signs There were no vitals taken for this visit. *** Physical Exam  ED Results and Treatments Labs (all labs ordered are listed, but only abnormal results are displayed) Labs Reviewed  COMPREHENSIVE METABOLIC PANEL  CBC WITH DIFFERENTIAL/PLATELET  URINALYSIS, ROUTINE W REFLEX MICROSCOPIC  RAPID URINE DRUG SCREEN, HOSP PERFORMED  I-STAT CHEM 8, ED  TROPONIN I (HIGH SENSITIVITY)                                                                                                                          Radiology No results found.  Pertinent labs & imaging results that were available during my care of the patient were reviewed by me and considered in my medical decision making (see MDM for details).  Medications Ordered in ED Medications  levETIRAcetam (KEPPRA) IVPB 1500 mg/ 100 mL premix (1,500 mg Intravenous New Bag/Given 04/30/22 2138)  prochlorperazine (COMPAZINE) injection 10 mg (10 mg Intravenous Given 04/30/22 2129)  lactated ringers bolus 1,000 mL (1,000 mLs Intravenous New Bag/Given 04/30/22 2129)  LORazepam (ATIVAN) injection 2 mg (2 mg Intravenous Given 04/30/22 2128)                                                                                                                                      Procedures Procedures  (including critical care time)  Medical Decision Making / ED Course   This patient presents to the ED for concern of ***, this involves an extensive number of treatment options, and is a complaint that carries with it a high risk of complications and morbidity.  The differential diagnosis includes ***  MDM: ***   Additional history obtained: -Additional history obtained from *** -External records from outside source obtained and reviewed including: Chart review including previous notes, labs, imaging, consultation notes   Lab Tests: -I ordered, reviewed, and interpreted labs.   The pertinent results include:   Labs Reviewed  COMPREHENSIVE METABOLIC PANEL  CBC WITH DIFFERENTIAL/PLATELET  URINALYSIS, ROUTINE W REFLEX MICROSCOPIC  RAPID URINE DRUG SCREEN, HOSP PERFORMED  I-STAT CHEM 8, ED  TROPONIN I (HIGH SENSITIVITY)      EKG ***  EKG Interpretation  Date/Time:    Ventricular Rate:    PR Interval:    QRS Duration:   QT Interval:    QTC Calculation:   R Axis:     Text Interpretation:           Imaging Studies ordered: I ordered imaging studies including *** I independently visualized and interpreted imaging. I agree with the radiologist interpretation   Medicines ordered and prescription drug management: Meds ordered this encounter  Medications   prochlorperazine (COMPAZINE) injection 10 mg   DISCONTD: LORazepam (ATIVAN) 2 MG/ML concentrated solution 2 mg   lactated ringers bolus 1,000 mL   levETIRAcetam (KEPPRA) IVPB 1500 mg/ 100 mL premix   LORazepam (ATIVAN) injection 2 mg    -I have reviewed the patients home medicines and have made adjustments as needed  Critical interventions ***  Consultations Obtained: I requested consultation with the ***,  and discussed lab and imaging findings as well as pertinent plan - they recommend: ***   Cardiac Monitoring: The patient was maintained on a cardiac monitor.  I personally  viewed and interpreted the cardiac monitored which showed an underlying rhythm of: ***  Social Determinants of Health:  Factors impacting patients care include: ***   Reevaluation: After the interventions noted above, I reevaluated the patient and found that they have :{resolved/improved/worsened:23923::"improved"}  Co morbidities that complicate the patient evaluation  Past Medical History:  Diagnosis Date   Abdominal pain    intermittent   Allergy    SEASONAL   Anemia    Aneurysm ([place])    brain   Asthma    Gastric cancer ([place])    GERD (gastroesophageal reflux disease)    Pancreatitis    PTSD (post-traumatic stress disorder) 2008   Rectal polyp 01/09/2006   Hyperplastic   Seizures ([place])    ADMITS TAKING MEDICATION FOR SEIZURE D/T ANEURYSM ,DO NOT KNOW IF HAD SEIZURE. 11/10/18      Dispostion: I considered admission for this patient, ***     Final Clinical Impression(s) / ED Diagnoses Final diagnoses:  None     @PCDICTATION @

## 2022-04-30 NOTE — ED Notes (Signed)
Father LYNN WAGAMAN [phone] would like an update asap

## 2022-04-30 NOTE — ED Triage Notes (Signed)
Pt BIBEMS combative in 4 way restraints, as per EMS report pt family report that he had a probable seizure. Pt verbal but in coherent

## 2022-05-01 ENCOUNTER — Emergency Department (HOSPITAL_COMMUNITY)
Admission: EM | Admit: 2022-05-01 | Discharge: 2022-05-01 | Disposition: A | Discharge status: Home / Self Care | Payer: Medicare HMO | Source: Home / Self Care | Attending: Student | Admitting: Student

## 2022-05-01 ENCOUNTER — Emergency Department (HOSPITAL_COMMUNITY): Payer: Medicare HMO

## 2022-05-01 DIAGNOSIS — Z85028 Personal history of other malignant neoplasm of stomach: Secondary | ICD-10-CM | POA: Insufficient documentation

## 2022-05-01 DIAGNOSIS — F17210 Nicotine dependence, cigarettes, uncomplicated: Secondary | ICD-10-CM | POA: Insufficient documentation

## 2022-05-01 DIAGNOSIS — R5383 Other fatigue: Secondary | ICD-10-CM

## 2022-05-01 DIAGNOSIS — J45909 Unspecified asthma, uncomplicated: Secondary | ICD-10-CM | POA: Insufficient documentation

## 2022-05-01 DIAGNOSIS — R4182 Altered mental status, unspecified: Secondary | ICD-10-CM | POA: Diagnosis not present

## 2022-05-01 DIAGNOSIS — R410 Disorientation, unspecified: Secondary | ICD-10-CM | POA: Diagnosis not present

## 2022-05-01 DIAGNOSIS — R519 Headache, unspecified: Secondary | ICD-10-CM | POA: Diagnosis not present

## 2022-05-01 LAB — RAPID URINE DRUG SCREEN, HOSP PERFORMED
Amphetamines: NOT DETECTED
Barbiturates: NOT DETECTED
Benzodiazepines: POSITIVE — AB
Cocaine: NOT DETECTED
Opiates: NOT DETECTED
Tetrahydrocannabinol: POSITIVE — AB

## 2022-05-01 LAB — TROPONIN I (HIGH SENSITIVITY): Troponin I (High Sensitivity): 16 ng/L (ref <18)

## 2022-05-01 MED ORDER — LEVETIRACETAM 500 MG PO TABS: 500.0000 mg | ORAL_TABLET | Freq: Two times a day (BID) | ORAL | Status: DC

## 2022-05-01 MED ORDER — FOLIC ACID 1 MG PO TABS: 1.0000 mg | ORAL_TABLET | Freq: Every day | ORAL | Status: DC

## 2022-05-01 MED ORDER — THIAMINE MONONITRATE 100 MG PO TABS
100.0000 mg | ORAL_TABLET | Freq: Every day | ORAL | Status: DC
  Administered 2022-05-01: 100 mg via ORAL
  Filled 2022-05-01: qty 1

## 2022-05-01 MED ORDER — LIDOCAINE 5 % EX PTCH
1.0000 | MEDICATED_PATCH | CUTANEOUS | Status: DC
  Administered 2022-05-01: 1 via TRANSDERMAL
  Filled 2022-05-01: qty 1

## 2022-05-01 MED ORDER — IOHEXOL 350 MG/ML SOLN
75.0000 mL | Freq: Once | INTRAVENOUS | Status: AC | PRN
  Administered 2022-05-01: 75 mL via INTRAVENOUS

## 2022-05-01 MED ORDER — LEVETIRACETAM 500 MG PO TABS
500.0000 mg | ORAL_TABLET | Freq: Two times a day (BID) | ORAL | Status: DC
  Administered 2022-05-01: 500 mg via ORAL
  Filled 2022-05-01: qty 1

## 2022-05-01 MED ORDER — FOLIC ACID 1 MG PO TABS
1.0000 mg | ORAL_TABLET | Freq: Every day | ORAL | Status: DC
  Administered 2022-05-01: 1 mg via ORAL
  Filled 2022-05-01: qty 1

## 2022-05-01 MED ORDER — LORAZEPAM 2 MG/ML IJ SOLN
1.0000 mg | Freq: Once | INTRAMUSCULAR | Status: AC
  Administered 2022-05-01: 1 mg via INTRAVENOUS
  Filled 2022-05-01: qty 1

## 2022-05-01 MED ORDER — THIAMINE MONONITRATE 100 MG PO TABS: 100.0000 mg | ORAL_TABLET | Freq: Every day | ORAL | Status: DC

## 2022-05-01 NOTE — Discharge Instructions (Addendum)
You were seen today for altered mental status.  Your workup today is largely reassuring.  Avoid drugs or alcohol.  Make sure that you are taking your seizure medications.  You do have slightly low sodium.  Increase salt intake in your diet and have recheck by your primary physician.

## 2022-05-01 NOTE — ED Provider Notes (Signed)
Patient signed out pending CT imaging and reassessment.  This is a 52 year old male with history of brain aneurysm, asthma, pancreatitis, seizures who presents with altered mental status.  Unclear whether he had a seizure prior to arrival; however, was notably altered.  CT scan does not show any obvious recurrent aneurysm and is otherwise normal.  Patient has benzodiazepine and THC.  Sodium 127.  Recent baseline 03/14/1929.  Not likely low enough to cause altered mental status but could lower seizure threshold.  Patient was given fluids.  He was loaded with Keppra.  On my recheck, he is somnolent but arousable and appropriate.  He did receive Ativan for CT scan.  Ultimately he was able to ambulate independently has baseline and is requesting discharge.  Recommend increase sodium intake in his diet and recheck by his primary physician.  Physical Exam  BP 128/87   Pulse 88   Temp 97.6 F (36.4 C) (Axillary)   Resp 15   Ht 1.88 m (6\' 2" )   Wt 68.9 kg   SpO2 100%   BMI 19.50 kg/m   Physical Exam Awake, alert, ambulatory Procedures  Procedures  ED Course / MDM   Clinical Course as of 05/01/22 0544  Tue May 01, 2022  0322 On recheck, patient somnolent but arousable.  Does not recall what provoked his hospital evaluation.  He is cooperative.  Will allow him to metabolize as he was given Ativan for CT. [CH]    Clinical Course User Index [CH] Chandra Feger, Barbette Hair, MD   Medical Decision Making Amount and/or Complexity of Data Reviewed Labs: ordered. Radiology: ordered.  Risk Prescription drug management.   Problem List Items Addressed This Visit       Other   Hyponatremia   Other Visit Diagnoses     Altered mental status, unspecified altered mental status type    -  Primary          Merryl Hacker, MD 05/01/22 [phone]

## 2022-05-01 NOTE — ED Triage Notes (Signed)
Patient BIB EMS from home for AMS and fatigue. Patient left AMA last night for AMS. VSS, CBG 129

## 2022-05-01 NOTE — ED Notes (Signed)
Pt return from CT scan.

## 2022-05-02 NOTE — ED Provider Notes (Signed)
[facility] Provider Note  CSN: [id number] Arrival date & time: 05/01/22 1708  Chief Complaint(s) Fatigue and Altered Mental Status  HPI [name] is a 52 y.o. male  with PMH brain aneurysm, asthma, gastric cancer, pancreatitis, seizures on Keppra who presents emergency department for evaluation of generalized fatigue.  I personally evaluated this patient last night in the emergency department where he had an episode of agitation, altered mental status and required multiple doses of sedative medication.  His workup was ultimately reassuring and after metabolization of underlying sedative medication, patient returned to normal mental status baseline and was ultimately discharged from the emergency department by oncoming overnight provider.  When speaking with the patient today, he has no recollection of these events and is unsure why he feels so tired today.  He states that he was talking with one of his friends who thought that he may have been slurring his words so they called an ambulance to bring him to the emergency department.  Here in the emergency department, patient actually looks significant improved compared to last night, is alert and oriented answering all questions appropriately and is requesting his home medication and food to eat.  He states that he feels like he "just needs a nap".   Past Medical History Past Medical History:  Diagnosis Date   Abdominal pain    intermittent   Allergy    SEASONAL   Anemia    Aneurysm (HCC)    brain   Asthma    Gastric cancer ([place])    GERD (gastroesophageal reflux disease)    Pancreatitis    PTSD (post-traumatic stress disorder) 2008   Rectal polyp 01/09/2006   Hyperplastic   Seizures ([place])    ADMITS TAKING MEDICATION FOR SEIZURE D/T ANEURYSM ,DO NOT KNOW IF HAD SEIZURE. 11/10/18   Patient Active Problem List   Diagnosis Date Noted   Personal history of stomach cancer 04/19/2019    Gastric cancer s/p distal gastrectomy 03/25/2019 03/25/2019   Personal history of colonic polyps 01/29/2019   History of pancreatitis 11/26/2017   Hyponatremia    Partial seizure ([place]) 03/05/2015   History of seizures 03/05/2015   Abdominal pain, chronic, epigastric 07/02/2014   ABDOMINAL PAIN, GENERALIZED 03/07/2010   KNEE PAIN, RIGHT 11/18/2009   HYPERGLYCEMIA 09/08/2009   ANKLE SPRAIN, LEFT 02/05/2009   BACK STRAIN, LUMBAR 02/05/2009   POLYURIA 10/28/2008   ABDOMINAL PAIN, EPIGASTRIC 10/28/2008   INSOMNIA, CHRONIC 10/10/2007   DEPRESSION, CHRONIC 10/10/2007   Asthma 10/10/2007   WOUND INFECTION 10/10/2007   History of alcohol abuse 04/15/2007   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 Authorizing Provider  atorvastatin (LIPITOR) 10 MG tablet Take 1 tablet by mouth at bedtime on MWF Patient taking differently: Take 10 mg by mouth See admin instructions. Take 1 tablet by mouth at bedtime on Monday Wednesdays and Fridays only per patient 06/04/21   [name], FNP  folic acid (FOLVITE) 1 MG tablet TAKE 1 TABLET BY MOUTH EVERY DAY Patient taking differently: Take 1 mg by mouth daily. 09/24/19   [name], FNP  levETIRAcetam (KEPPRA) 500 MG tablet Take 1 tablet (500 mg total) by mouth 2 (two) times daily. 08/04/21   Cameron Sprang, MD  Multiple Vitamin (MULTIVITAMIN WITH MINERALS) TABS tablet Take 1 tablet by mouth daily. 03/31/19  Maczis, Barth Kirks, PA-C  nicotine (NICODERM CQ - DOSED IN MG/24 HOURS) 21 mg/24hr patch Place 1 patch (21 mg total) onto the skin daily. Patient not taking: Reported on 12/06/2021 10/18/21 10/18/22  [name], FNP  oxyCODONE-acetaminophen (PERCOCET) 10-325 MG tablet Take 1 tablet by mouth every 6 (six) hours as needed for pain. 11/09/21 11/09/22  [name], FNP  polyvinyl alcohol  (LIQUIFILM TEARS) 1.4 % ophthalmic solution Place 1 drop into both eyes as needed for dry eyes.    [provider]  thiamine (VITAMIN B-1) 100 MG tablet Take 1 tablet (100 mg total) by mouth daily. 08/04/21   Cameron Sprang, MD  Vitamin D, Ergocalciferol, (DRISDOL) 1.25 MG (50000 UNIT) CAPS capsule Take 1 capsule (50,000 Units total) by mouth 2 (two) times a week. Patient taking differently: Take 50,000 Units by mouth See admin instructions. Take one tablet by mouth on Mondays and Fridays per patient 04/28/20   [name], FNP                                                                                                                                    Past Surgical History Past Surgical History:  Procedure Laterality Date   arm surgery Left    repair of tendons "work injury cut"-"pinky finger numb"   BURR HOLE Right 02/28/2015   Procedure: BURR HOLES FOR SUBDURAL HEMATOMA;  Surgeon: Newman Pies, MD;  Location: [facility];  Service: Neurosurgery;  Laterality: Right;   CEREBRAL ANEURYSM REPAIR     COLONOSCOPY  2016   colon polyps   ESOPHAGOGASTRODUODENOSCOPY (EGD) WITH PROPOFOL N/A 01/13/2015   Procedure: ESOPHAGOGASTRODUODENOSCOPY (EGD) WITH PROPOFOL;  Surgeon: [name], MD;  Location: [facility];  Service: Endoscopy;  Laterality: N/A;   ESOPHAGOGASTRODUODENOSCOPY (EGD) WITH PROPOFOL N/A 03/19/2019   Procedure: ESOPHAGOGASTRODUODENOSCOPY (EGD) WITH PROPOFOL;  Surgeon: [name], MD;  Location: [facility];  Service: Endoscopy;  Laterality: N/A;   ESOPHAGOGASTRODUODENOSCOPY ENDOSCOPY     EUS N/A 03/19/2019   Procedure: UPPER ENDOSCOPIC ULTRASOUND (EUS) RADIAL;  Surgeon: [name], MD;  Location: [facility];  Service: Endoscopy;  Laterality: N/A;   HERNIA REPAIR     Umbilical   LAPAROSCOPIC GASTRECTOMY  03/25/2019   LAPAROSCOPY DIAGNOSTIC (N/A )   LAPAROSCOPY N/A 03/25/2019   Procedure: LAPAROSCOPY DIAGNOSTIC;  Surgeon: [name], MD;  Location: [facility];  Service: General;  Laterality: N/A;   LYMPH NODE BIOPSY N/A 03/25/2019   Procedure: Gastric Lymph Node Biopsy;  Surgeon: [name], MD;  Location: [facility];  Service: General;  Laterality: N/A;   PARTIAL GASTRECTOMY N/A 03/25/2019   Procedure: DISTAL SUBTOTAL GASTRECTOMY;  Surgeon: [name], MD;  Location: [facility];  Service: General;  Laterality: N/A;   SUBMUCOSAL INJECTION  03/19/2019   Procedure: SUBMUCOSAL INJECTION;  Surgeon: [name], MD;  Location: [facility];  Service: Endoscopy;;   traumatic partial amputation of right little  finger     UPPER GASTROINTESTINAL ENDOSCOPY     Family History Family History  Problem Relation Age of Onset   Stroke Mother    Hypertension Mother    Diabetes Father    Pancreatic cancer Maternal Uncle    Colon cancer Maternal Uncle    Cancer Other    Diabetes Other    Colon cancer Maternal Grandfather    Colon cancer Paternal Grandfather    Pancreatic cancer Cousin    Esophageal cancer Neg Hx    Rectal cancer Neg Hx    Stomach cancer Neg Hx     Social History Social History   Tobacco Use   Smoking status: Some Days    Packs/day: 0    Types: Cigars, Cigarettes    Last attempt to quit: 03/2019    Years since quitting: 3.1   Smokeless tobacco: Never   Tobacco comments:    smokes a black and mild daily, reports smoking 2 cigarettes a day.   Vaping Use   Vaping Use: Never used  Substance Use Topics   Alcohol use: No   Drug use: Yes    Types: Marijuana    Comment: last use 3 days ago per per 08-07-19   Allergies Aspirin, Ibuprofen, Hydrocodone, and Tylenol [acetaminophen]  Review of Systems Review of Systems  Constitutional:  Positive for fatigue.    Physical Exam Vital Signs  I have reviewed the triage vital signs BP 113/76   Pulse 76   Temp 98.1 F (36.7 C) (Oral)   Resp 14   SpO2 100%   Physical Exam Constitutional:      General: He is not in acute distress.    Appearance: Normal appearance.  HENT:      Head: Normocephalic and atraumatic.     Nose: No congestion or rhinorrhea.  Eyes:     General:        Right eye: No discharge.        Left eye: No discharge.     Extraocular Movements: Extraocular movements intact.     Pupils: Pupils are equal, round, and reactive to light.  Cardiovascular:     Rate and Rhythm: Normal rate and regular rhythm.     Heart sounds: No murmur heard. Pulmonary:     Effort: No respiratory distress.     Breath sounds: No wheezing or rales.  Abdominal:     General: There is no distension.     Tenderness: There is no abdominal tenderness.  Musculoskeletal:        General: Normal range of motion.     Cervical back: Normal range of motion.  Skin:    General: Skin is warm and dry.  Neurological:     General: No focal deficit present.     Mental Status: He is alert.     ED Results and Treatments Labs (all labs ordered are listed, but only abnormal results are displayed) Labs Reviewed - No data to display  Radiology No results found.  Pertinent labs & imaging results that were available during my care of the patient were reviewed by me and considered in my medical decision making (see MDM for details).  Medications Ordered in ED Medications - No data to display                                                                                                                                   Procedures Procedures  (including critical care time)  Medical Decision Making / ED Course   This patient presents to the ED for concern of fatigue, this involves an extensive number of treatment options, and is a complaint that carries with it a high risk of complications and morbidity.    MDM: Patient seen emergency room for evaluation of fatigue.  Physical exam is unremarkable with no focal motor or sensory deficits.  No cranial nerve deficits.   Patient received an extensive workup yesterday including imaging studies of his previous brain aneurysm all of which were reassuringly unremarkable.  Given that his mental status is significant improved today compared to yesterday, we decided to defer additional laboratory workup here in the emergency department.  He was given his home medications and was able to tolerate p.o. without difficulty.  He was observed in the emergency department for approximately 3 and half hours and was able to ambulate without difficulty and remained asymptomatic here.  Suspect patient's fatigue is secondary to his severe agitation requiring sedation that lasted into the night last night and patient has not had any proper rest since then.  After sleeping here in the emergency department, patient appears much improved and he is safe for discharge with outpatient follow-up.   Additional history obtained:  -External records from outside source obtained and reviewed including: Chart review including previous notes, labs, imaging, consultation notes   EKG   EKG Interpretation  Date/Time:  Tuesday May 01 2022 17:47:09 EDT Ventricular Rate:  82 PR Interval:  151 QRS Duration: 84 QT Interval:  407 QTC Calculation: 476 R Axis:   84 Text Interpretation: Sinus rhythm Posterior infarct, old Confirmed by Orpah Greek [id number]) on 05/02/2022 9:17:51 AM           Medicines ordered and prescription drug management: Meds ordered this encounter  Medications   DISCONTD: folic acid (FOLVITE) tablet 1 mg   DISCONTD: levETIRAcetam (KEPPRA) tablet 500 mg   DISCONTD: thiamine (VITAMIN B1) tablet 100 mg   DISCONTD: folic acid (FOLVITE) tablet 1 mg   DISCONTD: levETIRAcetam (KEPPRA) tablet 500 mg   DISCONTD: thiamine (VITAMIN B1) tablet 100 mg   DISCONTD: lidocaine (LIDODERM) 5 % 1 patch    -I have reviewed the patients home medicines and have made adjustments as needed  Critical  interventions none    Cardiac Monitoring: The patient was maintained on a cardiac monitor.  I personally viewed and interpreted the cardiac monitored which showed an underlying  rhythm of: NSR  Social Determinants of Health:  Factors impacting patients care include: Currently sleeping at the [facility]   Reevaluation: After the interventions noted above, I reevaluated the patient and found that they have :improved  Co morbidities that complicate the patient evaluation  Past Medical History:  Diagnosis Date   Abdominal pain    intermittent   Allergy    SEASONAL   Anemia    Aneurysm ([place])    brain   Asthma    Gastric cancer ([place])    GERD (gastroesophageal reflux disease)    Pancreatitis    PTSD (post-traumatic stress disorder) 2008   Rectal polyp 01/09/2006   Hyperplastic   Seizures ([place])    ADMITS TAKING MEDICATION FOR SEIZURE D/T ANEURYSM ,DO NOT KNOW IF HAD SEIZURE. 11/10/18      Dispostion: I considered admission for this patient, but at this time he does not meet inpatient criteria for admission he is safe for discharge with outpatient follow-up     Final Clinical Impression(s) / ED Diagnoses Final diagnoses:  Other fatigue     @PCDICTATION @    Teressa Lower, MD 05/02/22 1044

## 2022-05-08 ENCOUNTER — Ambulatory Visit: Payer: Self-pay

## 2022-05-08 ENCOUNTER — Telehealth: Payer: Self-pay

## 2022-05-08 NOTE — Telephone Encounter (Signed)
        Patient  visited [facility] on 3/19     Telephone encounter attempt : 1st   A HIPAA compliant voice message was left requesting a return call.  Instructed patient to call back .    [facility] [phone] [address], [place], [place] [address] Phone: [phone] Email: [email or web address]

## 2022-05-08 NOTE — Patient Outreach (Signed)
  Care Coordination   05/08/2022 Name: [name] MRN: [id number] DOB: 12-28-1970   Care Coordination Outreach Attempts:  An unsuccessful telephone outreach was attempted for a scheduled appointment today.  Follow Up Plan:  Additional outreach attempts will be made to offer the patient care coordination information and services.   Encounter Outcome:  No Answer   Care Coordination Interventions:  No, not indicated    Barb Merino, RN, BSN, CCM Care Management Coordinator [facility] Care Management  Direct Phone: [phone]

## 2022-05-09 ENCOUNTER — Telehealth: Payer: Self-pay

## 2022-05-09 NOTE — Telephone Encounter (Signed)
        Patient  visited [facility] on  3/19   Telephone encounter attempt :  2nd  A HIPAA compliant voice message was left requesting a return call.  Instructed patient to call back .    [facility] [phone] [address], [place], [place] [address] Phone: [phone] Email: [email or web address]

## 2022-05-15 ENCOUNTER — Ambulatory Visit: Payer: Self-pay

## 2022-05-15 NOTE — Patient Outreach (Signed)
  Care Coordination   05/15/2022 Name: [name] MRN: [id number] DOB: 17-Aug-1970   Care Coordination Outreach Attempts:  An unsuccessful telephone outreach was attempted for a scheduled appointment today.  Follow Up Plan:  Additional outreach attempts will be made to offer the patient care coordination information and services.   Encounter Outcome:  No Answer   Care Coordination Interventions:  No, not indicated    Barb Merino, RN, BSN, CCM Care Management Coordinator [facility] Care Management  Direct Phone: [phone]

## 2022-05-28 ENCOUNTER — Ambulatory Visit: Payer: Self-pay

## 2022-05-28 NOTE — Patient Instructions (Signed)
Visit Information  Thank you for taking time to visit with me today. Please don't hesitate to contact me if I can be of assistance to you.   Following are the goals we discussed today:   Goals Addressed             This Visit's Progress    RN CC Activities - further follow up needed       Care Coordination Interventions: Discussed with patient he is living in a facility with a large group of people pending the outcome of a meeting he has scheduled in May to review options with DSS for housing Determined patient has no safety concerns at this time but is not happy with his current living conditions Active listening / Reflection utilized  Emotional Support Provided       To remain Seizure free       Care Coordination Interventions: Evaluation of current treatment plan related to Seizure disorder and patient's adherence to plan as established by provider Reviewed and discussed with patient 2 recent ED visits in March due to fatigue and confusion Discussed with patient these symptoms were related to his Seizures, he admits to missing a couple doses of his Keppra Provided education to patient while providing rationale re: risk for Seizures due to missed doses of antiseizure meds Reviewed medications with patient and discussed the importance of adherence to taking his Seizure medication exactly as prescribed without missed doses Determined patient does not currently have a scheduled follow up visit with Neurology Reviewed with patient upcoming next scheduled follow up with PCP/AWV Arnette Felts FNP scheduled for 06/21/22 @3 :30 PM            Our next appointment is by telephone on 06/25/22 at 11:30 AM  Please call the care guide team at [phone] if you need to cancel or reschedule your appointment.   If you are experiencing a Mental Health or Behavioral Health Crisis or need someone to talk to, please call 1-800-273-TALK (toll free, 24 hour hotline) go to [facility] [address], [place] [phone])  The patient verbalized understanding of instructions, educational materials, and care plan provided today and DECLINED offer to receive copy of patient instructions, educational materials, and care plan.   Delsa Sale, RN, BSN, CCM Care Management Coordinator [facility] Care Management Direct Phone: [phone]

## 2022-05-28 NOTE — Patient Outreach (Signed)
  Care Coordination   Follow Up Visit Note   05/28/2022 Name: [name] MRN: [id number] DOB: 24-May-1970  [name] is a 52 y.o. year old male who sees Arnette Felts, FNP for primary care. I spoke with  [name] by phone today.  What matters to the patients health and wellness today?  Patient will take his anti-seizure medication exactly as prescribed without missed doses.  Patient will keep his PCP follow up appointment as directed.    Goals Addressed             This Visit's Progress    RN CC Activities - further follow up needed       Care Coordination Interventions: Discussed with patient he is living in a facility with a large group of people pending the outcome of a meeting he has scheduled in May to review options with DSS for housing Determined patient has no safety concerns at this time but is not happy with his current living conditions Active listening / Reflection utilized  Emotional Support Provided       To remain Seizure free       Care Coordination Interventions: Evaluation of current treatment plan related to Seizure disorder and patient's adherence to plan as established by provider Reviewed and discussed with patient 2 recent ED visits in March due to fatigue and confusion Discussed with patient these symptoms were related to his Seizures, he admits to missing a couple doses of his Keppra Provided education to patient while providing rationale re: risk for Seizures due to missed doses of antiseizure meds Reviewed medications with patient and discussed the importance of adherence to taking his Seizure medication exactly as prescribed without missed doses Determined patient does not currently have a scheduled follow up visit with Neurology Reviewed with patient upcoming next scheduled follow up with PCP/AWV Arnette Felts FNP scheduled for 06/21/22 @3 :30 PM      Interventions Today    Flowsheet Row Most Recent Value  Chronic Disease    Chronic disease during today's visit Other  [Seizures]  General Interventions   General Interventions Discussed/Reviewed General Interventions Discussed, General Interventions Reviewed, Doctor Visits, Labs  Doctor Visits Discussed/Reviewed Doctor Visits Discussed, Doctor Visits Reviewed, PCP  Education Interventions   Education Provided Provided Education  Provided Verbal Education On Labs, Medication, When to see the doctor  Pharmacy Interventions   Pharmacy Dicussed/Reviewed Medications and their functions, Pharmacy Topics Discussed, Medication Adherence  Safety Interventions   Safety Discussed/Reviewed Home Safety          SDOH assessments and interventions completed:  No     Care Coordination Interventions:  Yes, provided   Follow up plan: Follow up call scheduled for 06/25/22 @11 :30 AM    Encounter Outcome:  Pt. Visit Completed

## 2022-06-21 ENCOUNTER — Ambulatory Visit: Payer: Medicare Other | Admitting: Nurse Practitioner

## 2022-06-25 ENCOUNTER — Telehealth: Payer: Self-pay | Admitting: Nurse Practitioner

## 2022-06-25 ENCOUNTER — Ambulatory Visit: Payer: Self-pay

## 2022-06-25 NOTE — Telephone Encounter (Signed)
Contacted [name] to schedule their annual wellness visit. Appointment made for 07/04/22.  [name] AWV direct phone # [phone]

## 2022-06-25 NOTE — Patient Outreach (Signed)
  Care Coordination   06/25/2022 Name: [name] MRN: [id number] DOB: 1970-02-24   Care Coordination Outreach Attempts:  An unsuccessful telephone outreach was attempted for a scheduled appointment today.  Follow Up Plan:  Additional outreach attempts will be made to offer the patient care coordination information and services.   Encounter Outcome:  No Answer   Care Coordination Interventions:  No, not indicated    Delsa Sale, RN, BSN, CCM Care Management Coordinator [facility] Care Management  Direct Phone: [phone]

## 2022-07-04 ENCOUNTER — Telehealth: Payer: Self-pay

## 2022-07-04 NOTE — Telephone Encounter (Signed)
This nurse attempted to call patient due to missed AWV. Call went to message that mailbox is full.

## 2022-07-20 ENCOUNTER — Ambulatory Visit: Payer: Self-pay

## 2022-07-20 NOTE — Patient Outreach (Signed)
  Care Coordination   Follow Up Visit Note   07/20/2022 Name: Avis Mcmahill MRN: [id number] DOB: 11-30-1970  Gaylen Daryll Spisak is a 52 y.o. year old male who sees Arnette Felts, FNP for primary care. I spoke with  Denilson Erin Fulling by phone today.  What matters to the patients health and wellness today?  Patient will reschedule his missed MD appts. He would like additional housing resources.     Goals Addressed             This Visit's Progress    RN CC Activities - further follow up needed       Care Coordination Interventions: Completed outbound call with patient Discussed patient remains to be homeless, he is on the [place] 8 housing list but the list is long Active listening / Reflection utilized  Emotional Support Provided  Determined patient is agreeable to speaking with LCSW Jenel Lucks for support and additional housing resources if available  Sent in [employer] to [place] requesting she outreach to the patient for assistance       To remain Seizure free       Care Coordination Interventions: Evaluation of current treatment plan related to Seizure disorder and patient's adherence to plan as established by provider Discussed with patient he missed last his PCP office visit with Arnette Felts FNP Determined patient has remained Seizure free since last nurse contact Collaborated with Elisha Ponder LPN regarding patient's missed AWV and PCP visit, Herbie Saxon will outreach to the patient to reschedule, patient is aware and will answer call to reschedule missed visits     Interventions Today    Flowsheet Row Most Recent Value  Chronic Disease   Chronic disease during today's visit Other  [Seizures]  General Interventions   General Interventions Discussed/Reviewed General Interventions Discussed, General Interventions Reviewed, Doctor Visits, Communication with  Communication with Social Work  [LCSW Jenel Lucks for housing resources]  Education Interventions    Education Provided Provided Education  Mental Health Interventions   Mental Health Discussed/Reviewed Mental Health Discussed, Mental Health Reviewed, Refer to Social Work for resources, Other  [patient is homeless]  Refer to Social Work for resources regarding Other  [homeless]          SDOH assessments and interventions completed:  No     Care Coordination Interventions:  Yes, provided   Follow up plan: Referral made to Jenel Lucks LCSW for psychosocial support and housing resources Follow up call scheduled for 08/13/22 @2 :00 PM    Encounter Outcome:  Pt. Visit Completed

## 2022-07-20 NOTE — Patient Instructions (Signed)
Visit Information  Thank you for taking time to visit with me today. Please don't hesitate to contact me if I can be of assistance to you.   Following are the goals we discussed today:   Goals Addressed             This Visit's Progress    RN CC Activities - further follow up needed       Care Coordination Interventions: Completed outbound call with patient Discussed patient remains to be homeless, he is on the [place] 8 housing list but the list is long Active listening / Reflection utilized  Emotional Support Provided  Determined patient is agreeable to speaking with LCSW Jenel Lucks for support and additional housing resources if available  Sent in [employer] to [place] requesting she outreach to the patient for assistance       To remain Seizure free       Care Coordination Interventions: Evaluation of current treatment plan related to Seizure disorder and patient's adherence to plan as established by provider Discussed with patient he missed last his PCP office visit with Arnette Felts FNP Determined patient has remained Seizure free since last nurse contact Collaborated with Elisha Ponder LPN regarding patient's missed AWV and PCP visit, Herbie Saxon will outreach to the patient to reschedule, patient is aware and will answer call to reschedule missed visits         Our next appointment is by telephone on 08/13/22 at 2:00 PM  Please call the care guide team at [phone] if you need to cancel or reschedule your appointment.   If you are experiencing a Mental Health or Behavioral Health Crisis or need someone to talk to, please call 1-800-273-TALK (toll free, 24 hour hotline)  The patient verbalized understanding of instructions, educational materials, and care plan provided today and DECLINED offer to receive copy of patient instructions, educational materials, and care plan.   Delsa Sale, RN, BSN, CCM Care Management Coordinator [facility] Care Management Direct Phone:  [phone]

## 2022-07-30 ENCOUNTER — Other Ambulatory Visit: Payer: Self-pay

## 2022-08-01 ENCOUNTER — Ambulatory Visit (INDEPENDENT_AMBULATORY_CARE_PROVIDER_SITE_OTHER): Payer: Medicare HMO

## 2022-08-01 ENCOUNTER — Encounter: Payer: Self-pay | Admitting: Nurse Practitioner

## 2022-08-01 ENCOUNTER — Ambulatory Visit (INDEPENDENT_AMBULATORY_CARE_PROVIDER_SITE_OTHER): Payer: Medicare HMO | Admitting: Nurse Practitioner

## 2022-08-01 VITALS — BP 126/82 | HR 71 | Temp 97.7°F | Ht 74.0 in | Wt 153.4 lb

## 2022-08-01 VITALS — BP 126/82 | HR 71 | Temp 97.7°F | Ht 74.0 in | Wt 153.0 lb

## 2022-08-01 DIAGNOSIS — Z8669 Personal history of other diseases of the nervous system and sense organs: Secondary | ICD-10-CM | POA: Diagnosis not present

## 2022-08-01 DIAGNOSIS — M545 Low back pain, unspecified: Secondary | ICD-10-CM | POA: Diagnosis not present

## 2022-08-01 DIAGNOSIS — Z0000 Encounter for general adult medical examination without abnormal findings: Secondary | ICD-10-CM

## 2022-08-01 DIAGNOSIS — J302 Other seasonal allergic rhinitis: Secondary | ICD-10-CM | POA: Diagnosis not present

## 2022-08-01 DIAGNOSIS — I700 Atherosclerosis of aorta: Secondary | ICD-10-CM

## 2022-08-01 DIAGNOSIS — Z2821 Immunization not carried out because of patient refusal: Secondary | ICD-10-CM | POA: Diagnosis not present

## 2022-08-01 DIAGNOSIS — R7309 Other abnormal glucose: Secondary | ICD-10-CM

## 2022-08-01 DIAGNOSIS — Z23 Encounter for immunization: Secondary | ICD-10-CM

## 2022-08-01 DIAGNOSIS — R911 Solitary pulmonary nodule: Secondary | ICD-10-CM | POA: Diagnosis not present

## 2022-08-01 DIAGNOSIS — Z720 Tobacco use: Secondary | ICD-10-CM | POA: Insufficient documentation

## 2022-08-01 DIAGNOSIS — R569 Unspecified convulsions: Secondary | ICD-10-CM | POA: Diagnosis not present

## 2022-08-01 MED ORDER — CETIRIZINE HCL 10 MG PO TABS: 10.0000 mg | ORAL_TABLET | Freq: Every day | ORAL | 1 refills | Status: AC

## 2022-08-01 MED ORDER — ATORVASTATIN CALCIUM 10 MG PO TABS: ORAL_TABLET | ORAL | 1 refills | Status: AC

## 2022-08-01 MED ORDER — VITAMIN B-1 100 MG PO TABS: 100.0000 mg | ORAL_TABLET | Freq: Every day | ORAL | 1 refills | Status: AC

## 2022-08-01 MED ORDER — FOLIC ACID 1 MG PO TABS: 1.0000 mg | ORAL_TABLET | Freq: Every day | ORAL | 1 refills | Status: AC

## 2022-08-01 NOTE — Patient Instructions (Signed)
You can get over the counter voltaren gel or salonpas patches.

## 2022-08-01 NOTE — Assessment & Plan Note (Signed)
HgbA1c is stable, encouraged to continue healthy diet low in sugar and carbs.

## 2022-08-01 NOTE — Progress Notes (Signed)
Madelaine Bhat, CMA,acting as a Neurosurgeon for Arnette Felts, FNP.,have documented all relevant documentation on the behalf of Arnette Felts, FNP,as directed by  Arnette Felts, FNP while in the presence of Arnette Felts, FNP.  Subjective:  Patient ID: [name] , male    DOB: 07-20-1970 , 52 y.o.   MRN: [id number]  Chief Complaint  Patient presents with   Diabetes    HPI  Patient presents today for a pre DM and keppra level, patient reports compliance with medications and has no other concerns today. Patient denies any chest pain, SOB, and headaches. He is followed by Dr. Karel Jarvis and was to follow up in 6 months (dec 2023). Also had his AWV done today by [facility] LPN  Patient reports he would like a medication for his allergies.   BP Readings from Last 3 Encounters: 08/01/22 : 126/82 05/01/22 : 113/76 05/01/22 : 118/87       Past Medical History:  Diagnosis Date   Abdominal pain    intermittent   Allergy    SEASONAL   Anemia    Aneurysm (HCC)    brain   Asthma    Gastric cancer (HCC)    GERD (gastroesophageal reflux disease)    Pancreatitis    PTSD (post-traumatic stress disorder) 2008   Rectal polyp 01/09/2006   Hyperplastic   Seizures (HCC)    ADMITS TAKING MEDICATION FOR SEIZURE D/T ANEURYSM ,DO NOT KNOW IF HAD SEIZURE. 11/10/18     Family History  Problem Relation Age of Onset   Stroke Mother    Hypertension Mother    Diabetes Father    Pancreatic cancer Maternal Uncle    Colon cancer Maternal Uncle    Cancer Other    Diabetes Other    Colon cancer Maternal Grandfather    Colon cancer Paternal Grandfather    Pancreatic cancer Cousin    Esophageal cancer Neg Hx    Rectal cancer Neg Hx    Stomach cancer Neg Hx      Current Outpatient Medications:    cetirizine (ZYRTEC ALLERGY) 10 MG tablet, Take 1 tablet (10 mg total) by mouth daily., Disp: 90 tablet, Rfl: 1   levETIRAcetam (KEPPRA) 500 MG tablet, Take 1 tablet (500 mg total) by mouth 2 (two) times  daily., Disp: 180 tablet, Rfl: 3   Multiple Vitamin (MULTIVITAMIN WITH MINERALS) TABS tablet, Take 1 tablet by mouth daily., Disp:  , Rfl:    polyvinyl alcohol (LIQUIFILM TEARS) 1.4 % ophthalmic solution, Place 1 drop into both eyes as needed for dry eyes., Disp: , Rfl:    atorvastatin (LIPITOR) 10 MG tablet, Take 1 tablet by mouth at bedtime on MWF, Disp: 45 tablet, Rfl: 1   folic acid (FOLVITE) 1 MG tablet, Take 1 tablet (1 mg total) by mouth daily., Disp: 90 tablet, Rfl: 1   nicotine (NICODERM CQ - DOSED IN MG/24 HOURS) 21 mg/24hr patch, Place 1 patch (21 mg total) onto the skin daily. (Patient not taking: Reported on 08/01/2022), Disp: 30 patch, Rfl: 1   oxyCODONE-acetaminophen (PERCOCET) 10-325 MG tablet, Take 1 tablet by mouth every 6 (six) hours as needed for pain. (Patient not taking: Reported on 08/01/2022), Disp: 20 tablet, Rfl: 0   thiamine (VITAMIN B-1) 100 MG tablet, Take 1 tablet (100 mg total) by mouth daily., Disp: 90 tablet, Rfl: 1   Vitamin D, Ergocalciferol, (DRISDOL) 1.25 MG (50000 UNIT) CAPS capsule, Take 1 capsule (50,000 Units total) by mouth 2 (two) times a week. (Patient not  taking: Reported on 08/01/2022), Disp: 24 capsule, Rfl: 1   Allergies  Allergen Reactions   Aspirin Anaphylaxis   Ibuprofen Anaphylaxis   Hydrocodone Hives and Itching   Tylenol [Acetaminophen]     Upset stomach     Review of Systems  Constitutional: Negative.  Negative for fatigue.  HENT:  Positive for rhinorrhea. Negative for congestion, sinus pressure and sinus pain.   Eyes:  Positive for itching.  Respiratory: Negative.  Negative for wheezing.   Cardiovascular: Negative.   Gastrointestinal:  Negative for abdominal pain.  Genitourinary: Negative.   Neurological: Negative.   Psychiatric/Behavioral: Negative.       Today's Vitals   08/01/22 1138  BP: 126/82  Pulse: 71  Temp: 97.7 F (36.5 C)  TempSrc: Oral  Weight: 153 lb 6.4 oz (69.6 kg)  Height: 6\' 2"  (1.88 m)  PainSc: 0-No pain    Body mass index is 19.7 kg/m.  Wt Readings from Last 3 Encounters:  08/01/22 153 lb (69.4 kg)  08/01/22 153 lb 6.4 oz (69.6 kg)  04/30/22 151 lb 14.4 oz (68.9 kg)    The 10-year ASCVD risk score (Arnett DK, et al., 2019) is: 8.7%   Values used to calculate the score:     Age: 52 years     Sex: Male     Is Non-Hispanic African American: Yes     Diabetic: No     Tobacco smoker: Yes     Systolic Blood Pressure: 126 mmHg     Is BP treated: No     HDL Cholesterol: 61 mg/dL     Total Cholesterol: 147 mg/dL  Objective:  Physical Exam Vitals reviewed.  Constitutional:      General: He is not in acute distress.    Appearance: Normal appearance.  Cardiovascular:     Rate and Rhythm: Normal rate and regular rhythm.     Pulses: Normal pulses.     Heart sounds: Normal heart sounds. No murmur heard. Pulmonary:     Effort: Pulmonary effort is normal. No respiratory distress.     Breath sounds: Normal breath sounds. No wheezing.  Musculoskeletal:        General: No swelling or tenderness. Normal range of motion.     Cervical back: Normal, normal range of motion and neck supple.     Thoracic back: Normal.     Lumbar back: Normal.  Skin:    General: Skin is warm and dry.     Capillary Refill: Capillary refill takes less than 2 seconds.  Neurological:     General: No focal deficit present.     Mental Status: He is alert and oriented to person, place, and time.     Cranial Nerves: No cranial nerve deficit.     Motor: No weakness.  Psychiatric:        Mood and Affect: Mood normal.        Behavior: Behavior normal.        Thought Content: Thought content normal.        Judgment: Judgment normal.         Assessment And Plan:  Abnormal glucose Assessment & Plan: HgbA1c is stable, encouraged to continue healthy diet low in sugar and carbs.   Orders: -     Hemoglobin A1c  History of seizure disorder Assessment & Plan: No recent seizures, will check keppra level and advised  to make sure f/u with Neurology  Orders: -     Levetiracetam level -  CMP14+EGFR  Atherosclerosis of aorta (HCC) Assessment & Plan: Discussed importance of statin, refill sent  Orders: -     Atorvastatin Calcium; Take 1 tablet by mouth at bedtime on MWF  Dispense: 45 tablet; Refill: 1  Lung nodule Assessment & Plan: Seen on his last low dose CT scan will repeat  Orders: -     CT CHEST WO CONTRAST; Future  Seasonal allergies -     Cetirizine HCl; Take 1 tablet (10 mg total) by mouth daily.  Dispense: 90 tablet; Refill: 1  Acute midline low back pain without sciatica Assessment & Plan: No abnormal findings on physical exam, given samples of Salonpas patches. May be some arthritis if not better will consider an xray   Need for zoster vaccination Shingrix #1 administered in office and TransRx signed, return in 2-6 months for 2nd injection -     Varicella-zoster vaccine IM  COVID-19 vaccination declined Assessment & Plan: Declines covid 19 vaccine. Discussed risk of covid 97 and if he changes her mind about the vaccine to call the office. Education has been provided regarding the importance of this vaccine but patient still declined. Advised may receive this vaccine at local pharmacy or Health Dept.or vaccine clinic. Aware to provide a copy of the vaccination record if obtained from local pharmacy or Health Dept.  Encouraged to take multivitamin, vitamin d, vitamin c and zinc to increase immune system. Aware can call office if would like to have vaccine here at office.    Personal history of stomach cancer Assessment & Plan: No issues, continue f/u with GI   Other orders -     Folic Acid; Take 1 tablet (1 mg total) by mouth daily.  Dispense: 90 tablet; Refill: 1 -     Vitamin B-1; Take 1 tablet (100 mg total) by mouth daily.  Dispense: 90 tablet; Refill: 1     Return for controlled DM check 4 months.  Patient was given opportunity to ask questions. Patient verbalized  understanding of the plan and was able to repeat key elements of the plan. All questions were answered to their satisfaction.  Arnette Felts, FNP  I, Arnette Felts, FNP, have reviewed all documentation for this visit. The documentation on 08/01/22 for the exam, diagnosis, procedures, and orders are all accurate and complete.   IF YOU HAVE BEEN REFERRED TO A SPECIALIST, IT MAY TAKE 1-2 WEEKS TO SCHEDULE/PROCESS THE REFERRAL. IF YOU HAVE NOT HEARD FROM US/SPECIALIST IN TWO WEEKS, PLEASE GIVE [place] A CALL AT [phone].

## 2022-08-01 NOTE — Assessment & Plan Note (Signed)
Samples of Zyrtec given, if not better return call to office.

## 2022-08-01 NOTE — Assessment & Plan Note (Signed)
No abnormal findings on physical exam, given samples of Salonpas patches. May be some arthritis if not better will consider an xray

## 2022-08-01 NOTE — Progress Notes (Signed)
Subjective:   [name] is a 52 y.o. male who presents for Medicare Annual/Subsequent preventive examination.  Visit Complete: In person    Review of Systems     Cardiac Risk Factors include: smoking/ tobacco exposure;male gender     Objective:    Today's Vitals   08/01/22 1156  BP: 126/82  Pulse: 71  Temp: 97.7 F (36.5 C)  TempSrc: Oral  Weight: 153 lb (69.4 kg)  Height: 6\' 2"  (1.88 m)   Body mass index is 19.64 kg/m.     08/01/2022   12:09 PM 09/30/2021    9:11 PM 08/04/2021    4:08 PM 06/07/2021    4:28 PM 08/02/2020    3:14 PM 04/21/2020    2:28 PM 08/03/2019    2:21 PM  Advanced Directives  Does Patient Have a Medical Advance Directive? No No No Yes No No No  Type of Theme park manager;Living will     Copy of Healthcare Power of Attorney in Chart?    No - copy requested     Would patient like information on creating a medical advance directive? No - Patient declined     No - Patient declined     Current Medications (verified) Outpatient Encounter Medications as of 08/01/2022  Medication Sig   atorvastatin (LIPITOR) 10 MG tablet Take 1 tablet by mouth at bedtime on MWF (Patient taking differently: Take 10 mg by mouth See admin instructions. Take 1 tablet by mouth at bedtime on Monday Wednesdays and Fridays only per patient)   folic acid (FOLVITE) 1 MG tablet TAKE 1 TABLET BY MOUTH EVERY DAY (Patient taking differently: Take 1 mg by mouth daily.)   levETIRAcetam (KEPPRA) 500 MG tablet Take 1 tablet (500 mg total) by mouth 2 (two) times daily.   Multiple Vitamin (MULTIVITAMIN WITH MINERALS) TABS tablet Take 1 tablet by mouth daily.   polyvinyl alcohol (LIQUIFILM TEARS) 1.4 % ophthalmic solution Place 1 drop into both eyes as needed for dry eyes.   thiamine (VITAMIN B-1) 100 MG tablet Take 1 tablet (100 mg total) by mouth daily.   nicotine (NICODERM CQ - DOSED IN MG/24 HOURS) 21 mg/24hr patch Place 1 patch (21 mg total) onto  the skin daily. (Patient not taking: Reported on 08/01/2022)   oxyCODONE-acetaminophen (PERCOCET) 10-325 MG tablet Take 1 tablet by mouth every 6 (six) hours as needed for pain. (Patient not taking: Reported on 08/01/2022)   Vitamin D, Ergocalciferol, (DRISDOL) 1.25 MG (50000 UNIT) CAPS capsule Take 1 capsule (50,000 Units total) by mouth 2 (two) times a week. (Patient not taking: Reported on 08/01/2022)   No facility-administered encounter medications on file as of 08/01/2022.    Allergies (verified) Aspirin, Ibuprofen, Hydrocodone, and Tylenol [acetaminophen]   History: Past Medical History:  Diagnosis Date   Abdominal pain    intermittent   Allergy    SEASONAL   Anemia    Aneurysm (HCC)    brain   Asthma    Gastric cancer (HCC)    GERD (gastroesophageal reflux disease)    Pancreatitis    PTSD (post-traumatic stress disorder) 2008   Rectal polyp 01/09/2006   Hyperplastic   Seizures (HCC)    ADMITS TAKING MEDICATION FOR SEIZURE D/T ANEURYSM ,DO NOT KNOW IF HAD SEIZURE. 11/10/18   Past Surgical History:  Procedure Laterality Date   arm surgery Left    repair of tendons "work injury cut"-"pinky finger numb"   BURR HOLE Right 02/28/2015  Procedure: BURR HOLES FOR SUBDURAL HEMATOMA;  Surgeon: Tressie Stalker, MD;  Location: [facility];  Service: Neurosurgery;  Laterality: Right;   CEREBRAL ANEURYSM REPAIR     COLONOSCOPY  2016   colon polyps   ESOPHAGOGASTRODUODENOSCOPY (EGD) WITH PROPOFOL N/A 01/13/2015   Procedure: ESOPHAGOGASTRODUODENOSCOPY (EGD) WITH PROPOFOL;  Surgeon: Rachael Fee, MD;  Location: [facility];  Service: Endoscopy;  Laterality: N/A;   ESOPHAGOGASTRODUODENOSCOPY (EGD) WITH PROPOFOL N/A 03/19/2019   Procedure: ESOPHAGOGASTRODUODENOSCOPY (EGD) WITH PROPOFOL;  Surgeon: Rachael Fee, MD;  Location: [facility];  Service: Endoscopy;  Laterality: N/A;   ESOPHAGOGASTRODUODENOSCOPY ENDOSCOPY     EUS N/A 03/19/2019   Procedure: UPPER ENDOSCOPIC ULTRASOUND (EUS)  RADIAL;  Surgeon: Rachael Fee, MD;  Location: [facility];  Service: Endoscopy;  Laterality: N/A;   HERNIA REPAIR     Umbilical   LAPAROSCOPIC GASTRECTOMY  03/25/2019   LAPAROSCOPY DIAGNOSTIC (N/A )   LAPAROSCOPY N/A 03/25/2019   Procedure: LAPAROSCOPY DIAGNOSTIC;  Surgeon: Almond Lint, MD;  Location: [facility];  Service: General;  Laterality: N/A;   LYMPH NODE BIOPSY N/A 03/25/2019   Procedure: Gastric Lymph Node Biopsy;  Surgeon: Almond Lint, MD;  Location: [facility];  Service: General;  Laterality: N/A;   PARTIAL GASTRECTOMY N/A 03/25/2019   Procedure: DISTAL SUBTOTAL GASTRECTOMY;  Surgeon: Almond Lint, MD;  Location: [facility];  Service: General;  Laterality: N/A;   SUBMUCOSAL INJECTION  03/19/2019   Procedure: SUBMUCOSAL INJECTION;  Surgeon: Rachael Fee, MD;  Location: [facility];  Service: Endoscopy;;   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     UPPER GASTROINTESTINAL ENDOSCOPY     Family History  Problem Relation Age of Onset   Stroke Mother    Hypertension Mother    Diabetes Father    Pancreatic cancer Maternal Uncle    Colon cancer Maternal Uncle    Cancer Other    Diabetes Other    Colon cancer Maternal Grandfather    Colon cancer Paternal Grandfather    Pancreatic cancer Cousin    Esophageal cancer Neg Hx    Rectal cancer Neg Hx    Stomach cancer Neg Hx    Social History   Socioeconomic History   Marital status: Single    Spouse name: Not on file   Number of children: Not on file   Years of education: Not on file   Highest education level: Not on file  Occupational History   Occupation: disable  Tobacco Use   Smoking status: Some Days    Packs/day: 0    Types: Cigars, Cigarettes    Last attempt to quit: 03/2019    Years since quitting: 3.3   Smokeless tobacco: Never   Tobacco comments:    smokes a black and mild daily, reports smoking 2 cigarettes a day.   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 [employer]   Social Determinants of Health   Financial Resource Strain: Low Risk  (08/01/2022)   Overall Financial Resource Strain (CARDIA)  Difficulty of Paying Living Expenses: Not hard at all  Food Insecurity: No Food Insecurity (08/01/2022)   Hunger Vital Sign    Worried About Running Out of Food in the Last Year: Never true    Ran Out of Food in the Last Year: Never true  Transportation Needs: No Transportation Needs (08/01/2022)   PRAPARE - Administrator, Civil Service (Medical): No    Lack of Transportation (Non-Medical): No  Physical Activity: Inactive (08/01/2022)   Exercise Vital Sign    Days of Exercise per Week: 0 days    Minutes of Exercise per Session: 0 min  Stress: Stress Concern Present (08/01/2022)   [employer] of Occupational Health - Occupational Stress Questionnaire    Feeling of Stress : To some extent  Social Connections: Patient Declined (08/01/2022)   Social Connection and Isolation Panel [NHANES]    Frequency of Communication with Friends and Family: Patient declined    Frequency of Social Gatherings with Friends and Family: Patient declined    Attends Religious Services: Patient declined    Database administrator or Organizations: Patient declined    Attends Engineer, structural: Patient declined    Marital Status: Patient declined    Tobacco Counseling Ready to quit: Not Answered Counseling given: Not Answered Tobacco comments: smokes a black and mild daily, reports smoking 2 cigarettes a day.    Clinical Intake:  Pre-visit preparation completed: Yes  Pain : No/denies pain     Nutritional  Status: BMI of 19-24  Normal Nutritional Risks: None Diabetes: No  How often do you need to have someone help you when you read instructions, pamphlets, or other written materials from your doctor or pharmacy?: 1 - Never  Interpreter Needed?: No  Information entered by :: NAllen LPN   Activities of Daily Living    08/01/2022   11:59 AM  In your present state of health, do you have any difficulty performing the following activities:  Hearing? 0  Vision? 0  Difficulty concentrating or making decisions? 0  Walking or climbing stairs? 0  Dressing or bathing? 0  Doing errands, shopping? 0  Preparing Food and eating ? N  Using the Toilet? N  In the past six months, have you accidently leaked urine? N  Do you have problems with loss of bowel control? N  Managing your Medications? N  Managing your Finances? N  Housekeeping or managing your Housekeeping? N    Patient Care Team: Arnette Felts, FNP as PCP - General (General Practice) Clarene Duke Karma Lew, RN as Case Manager Karel Jarvis Lesle Chris, MD as Consulting Physician (Neurology) Harlan Stains, [facility] (Inactive) (Pharmacist)  Indicate any recent Medical Services you may have received from other than [facility] providers in the past year (date may be approximate).     Assessment:   This is a routine wellness examination for Ericson.  Hearing/Vision screen Vision Screening - Comments:: No regular eye exams  Dietary issues and exercise activities discussed:     Goals Addressed             This Visit's Progress    Patient Stated       08/01/2022, no goals       Depression Screen    08/01/2022   12:11 PM 08/01/2022   11:37 AM 11/09/2021   10:43 AM 06/07/2021    4:30 PM 05/16/2021    3:02 PM 05/16/2021    2:59 PM 04/21/2020    2:29 PM  PHQ 2/9 Scores  PHQ - 2 Score 0 0 0 0 0 0 0  PHQ- 9 Score  0   0      Fall Risk    08/01/2022   12:10 PM 08/01/2022   11:35 AM 11/09/2021   10:43 AM 08/04/2021    4:07 PM 06/07/2021    4:30 PM  Fall  Risk   Falls in the past year? 1 0 0 1 1  Comment passed out due to aneurysm    just went out  Number falls in past yr: 0 0 0 1 1  Injury with Fall? 0 0 0 0 0  Risk for fall due to : Medication side effect No Fall Risks No Fall Risks  Medication side effect  Follow up Falls prevention discussed;Education provided;Falls evaluation completed Falls evaluation completed Falls evaluation completed  Falls evaluation completed;Education provided;Falls prevention discussed    MEDICARE RISK AT HOME:  Medicare Risk at Home - 08/01/22 1211     Any stairs in or around the home? No    If so, are there any without handrails? No    Home free of loose throw rugs in walkways, pet beds, electrical cords, etc? Yes    Adequate lighting in your home to reduce risk of falls? Yes    Life alert? No    Use of a cane, walker or w/c? No    Grab bars in the bathroom? No    Shower chair or bench in shower? No    Elevated toilet seat or a handicapped toilet? No             TIMED UP AND GO:  Was the test performed?  Yes  Length of time to ambulate 10 feet: 5 sec Gait steady and fast without use of assistive device    Cognitive Function:        08/01/2022   12:11 PM 06/07/2021    4:32 PM 04/21/2020    2:30 PM 04/16/2019    2:59 PM 04/02/2018   11:04 AM  6CIT Screen  What Year? 0 points 0 points 0 points 4 points 0 points  What month? 0 points 0 points 0 points 0 points 0 points  What time? 3 points 3 points 0 points 0 points 0 points  Count back from 20 0 points 0 points 2 points 2 points 0 points  Months in reverse 4 points 4 points 4 points 4 points 4 points  Repeat phrase 0 points 6 points 4 points 0 points 0 points  Total Score 7 points 13 points 10 points 10 points 4 points    Immunizations Immunization History  Administered Date(s) Administered   Influenza Whole 01/12/2009, 11/18/2009   Influenza,inj,Quad PF,6+ Mos 03/01/2015, 12/24/2017, 10/29/2019   PFIZER Comirnaty(Gray Top)Covid-19  Tri-Sucrose Vaccine 05/26/2020   PFIZER(Purple Top)SARS-COV-2 Vaccination 06/15/2019   Pneumococcal Polysaccharide-23 03/02/2015   Tdap 04/05/2018    TDAP status: Up to date  Flu Vaccine status: Up to date  Pneumococcal vaccine status: Up to date  Covid-19 vaccine status: Completed vaccines  Qualifies for Shingles Vaccine? Yes   Zostavax completed No   Shingrix Completed?: had first dose today  Screening Tests Health Maintenance  Topic Date Due   Colonoscopy  11/23/2021   Medicare Annual Wellness (AWV)  06/08/2022   COVID-19 Vaccine (3 - Pfizer risk series) 08/17/2022 (Originally 06/23/2020)   Zoster Vaccines- Shingrix (1 of 2) 11/01/2022 (Originally 08/16/1987)   INFLUENZA VACCINE  09/13/2022   DTaP/Tdap/Td (2 - Td or Tdap) 04/05/2028  Hepatitis C Screening  Completed   HIV Screening  Completed   HPV VACCINES  Aged Out    Health Maintenance  Health Maintenance Due  Topic Date Due   Colonoscopy  11/23/2021   Medicare Annual Wellness (AWV)  06/08/2022    Colorectal cancer screening: Type of screening: Colonoscopy. Completed 11/24/2018. Repeat every 3 years  Lung Cancer Screening: (Low Dose CT Chest recommended if Age 58-80 years, 20 pack-year currently smoking OR have quit w/in 15years.) does not qualify.   Lung Cancer Screening Referral: no  Additional Screening:  Hepatitis C Screening: does qualify; Completed 10/29/2019  Vision Screening: Recommended annual ophthalmology exams for early detection of glaucoma and other disorders of the eye. Is the patient up to date with their annual eye exam?  No  Who is the provider or what is the name of the office in which the patient attends annual eye exams? Non e If pt is not established with a provider, would they like to be referred to a provider to establish care? No .   Dental Screening: Recommended annual dental exams for proper oral hygiene  Diabetic Foot Exam:   Community Resource Referral / Chronic Care  Management: CRR required this visit?  No   CCM required this visit?  No     Plan:     I have personally reviewed and noted the following in the patient's chart:   Medical and social history Use of alcohol, tobacco or illicit drugs  Current medications and supplements including opioid prescriptions. Patient is not currently taking opioid prescriptions. Functional ability and status Nutritional status Physical activity Advanced directives List of other physicians Hospitalizations, surgeries, and ER visits in previous 12 months Vitals Screenings to include cognitive, depression, and falls Referrals and appointments  In addition, I have reviewed and discussed with patient certain preventive protocols, quality metrics, and best practice recommendations. A written personalized care plan for preventive services as well as general preventive health recommendations were provided to patient.     Barb Merino, LPN   [id number]   After Visit Summary: in person  Nurse Notes: none

## 2022-08-01 NOTE — Assessment & Plan Note (Signed)
Discussed importance of statin, refill sent

## 2022-08-01 NOTE — Patient Instructions (Signed)
[name] , Thank you for taking time to come for your Medicare Wellness Visit. I appreciate your ongoing commitment to your health goals. Please review the following plan we discussed and let me know if I can assist you in the future.   These are the goals we discussed:  Goals       I have a hand injury      Care Coordination Interventions: Evaluation of current treatment plan related to Left hand boxer's fracture and patient's adherence to plan as established by provider  Determined patient continues to have slow bone healing secondary to a left 4th/5th metacarpal fractures Reviewed and discussed recent Ortho follow up and MD recommendations Determined patient is adhering to his plan of care and will continue to f/u with Orthopedics as directed      Manage My Medicine      Timeframe:  Long-Range Goal Priority:  High Start Date:                             Expected End Date:                       Follow Up Date: 02/01/2021   - call for medicine refill 2 or 3 days before it runs out - call if I am sick and can't take my medicine - use a pillbox to sort medicine    Why is this important?   These steps will help you keep on track with your medicines.        Patient Stated (pt-stated)      Wants to get up to 300 pounds.      Patient Stated      04/16/2019, wants to wok out more      Patient Stated      04/22/2019, stay healthy      Patient Stated      06/07/2021, no goals      Patient Stated      08/01/2022, no goals      RN CC Activities - further follow up needed      Care Coordination Interventions: Completed outbound call with patient Discussed patient remains to be homeless, he is on the section 8 housing list but the list is long Active listening / Reflection utilized  Emotional Support Provided  Determined patient is agreeable to speaking with LCSW Jenel Lucks for support and additional housing resources if available  Sent in [employer] to [place] requesting  she outreach to the patient for assistance        To remain Seizure free      Care Coordination Interventions: Evaluation of current treatment plan related to Seizure disorder and patient's adherence to plan as established by provider Discussed with patient he missed last his PCP office visit with Arnette Felts FNP Determined patient has remained Seizure free since last nurse contact Collaborated with Elisha Ponder LPN regarding patient's missed AWV and PCP visit, Herbie Saxon will outreach to the patient to reschedule, patient is aware and will answer call to reschedule missed visits         This is a list of the screening recommended for you and due dates:  Health Maintenance  Topic Date Due   Colon Cancer Screening  11/23/2021   COVID-19 Vaccine (3 - Pfizer risk series) 08/17/2022*   Zoster (Shingles) Vaccine (1 of 2) 11/01/2022*   Flu Shot  09/13/2022   Medicare Annual Wellness Visit  08/01/2023   DTaP/Tdap/Td vaccine (2 - Td or Tdap) 04/05/2028   Hepatitis C Screening  Completed   HIV Screening  Completed   HPV Vaccine  Aged Out  *Topic was postponed. The date shown is not the original due date.    Advanced directives: Advance directive discussed with you today. Even though you declined this today please call our office should you change your mind and we can give you the proper paperwork for you to fill out.  Conditions/risks identified: smoking  Next appointment: Follow up in one year for your annual wellness visit   Preventive Care 40-64 Years, Male Preventive care refers to lifestyle choices and visits with your health care provider that can promote health and wellness. What does preventive care include? A yearly physical exam. This is also called an annual well check. Dental exams once or twice a year. Routine eye exams. Ask your health care provider how often you should have your eyes checked. Personal lifestyle choices, including: Daily care of your teeth and  gums. Regular physical activity. Eating a healthy diet. Avoiding tobacco and drug use. Limiting alcohol use. Practicing safe sex. Taking low-dose aspirin every day starting at age 39. What happens during an annual well check? The services and screenings done by your health care provider during your annual well check will depend on your age, overall health, lifestyle risk factors, and family history of disease. Counseling  Your health care provider may ask you questions about your: Alcohol use. Tobacco use. Drug use. Emotional well-being. Home and relationship well-being. Sexual activity. Eating habits. Work and work Astronomer. Screening  You may have the following tests or measurements: Height, weight, and BMI. Blood pressure. Lipid and cholesterol levels. These may be checked every 5 years, or more frequently if you are over 63 years old. Skin check. Lung cancer screening. You may have this screening every year starting at age 37 if you have a 30-pack-year history of smoking and currently smoke or have quit within the past 15 years. Fecal occult blood test (FOBT) of the stool. You may have this test every year starting at age 68. Flexible sigmoidoscopy or colonoscopy. You may have a sigmoidoscopy every 5 years or a colonoscopy every 10 years starting at age 80. Prostate cancer screening. Recommendations will vary depending on your family history and other risks. Hepatitis C blood test. Hepatitis B blood test. Sexually transmitted disease (STD) testing. Diabetes screening. This is done by checking your blood sugar (glucose) after you have not eaten for a while (fasting). You may have this done every 1-3 years. Discuss your test results, treatment options, and if necessary, the need for more tests with your health care provider. Vaccines  Your health care provider may recommend certain vaccines, such as: Influenza vaccine. This is recommended every year. Tetanus, diphtheria, and  acellular pertussis (Tdap, Td) vaccine. You may need a Td booster every 10 years. Zoster vaccine. You may need this after age 56. Pneumococcal 13-valent conjugate (PCV13) vaccine. You may need this if you have certain conditions and have not been vaccinated. Pneumococcal polysaccharide (PPSV23) vaccine. You may need one or two doses if you smoke cigarettes or if you have certain conditions. Talk to your health care provider about which screenings and vaccines you need and how often you need them. This information is not intended to replace advice given to you by your health care provider. Make sure you discuss any questions you have with your health care provider. Document Released: 02/25/2015 Document Revised:  10/19/2015 Document Reviewed: 11/30/2014 Elsevier Interactive Patient Education  2017 [employer].  Fall Prevention in the Home Falls can cause injuries. They can happen to people of all ages. There are many things you can do to make your home safe and to help prevent falls. What can I do on the outside of my home? Regularly fix the edges of walkways and driveways and fix any cracks. Remove anything that might make you trip as you walk through a door, such as a raised step or threshold. Trim any bushes or trees on the path to your home. Use bright outdoor lighting. Clear any walking paths of anything that might make someone trip, such as rocks or tools. Regularly check to see if handrails are loose or broken. Make sure that both sides of any steps have handrails. Any raised decks and porches should have guardrails on the edges. Have any leaves, snow, or ice cleared regularly. Use sand or salt on walking paths during winter. Clean up any spills in your garage right away. This includes oil or grease spills. What can I do in the bathroom? Use night lights. Install grab bars by the toilet and in the tub and shower. Do not use towel bars as grab bars. Use non-skid mats or decals in the  tub or shower. If you need to sit down in the shower, use a plastic, non-slip stool. Keep the floor dry. Clean up any water that spills on the floor as soon as it happens. Remove soap buildup in the tub or shower regularly. Attach bath mats securely with double-sided non-slip rug tape. Do not have throw rugs and other things on the floor that can make you trip. What can I do in the bedroom? Use night lights. Make sure that you have a light by your bed that is easy to reach. Do not use any sheets or blankets that are too big for your bed. They should not hang down onto the floor. Have a firm chair that has side arms. You can use this for support while you get dressed. Do not have throw rugs and other things on the floor that can make you trip. What can I do in the kitchen? Clean up any spills right away. Avoid walking on wet floors. Keep items that you use a lot in easy-to-reach places. If you need to reach something above you, use a strong step stool that has a grab bar. Keep electrical cords out of the way. Do not use floor polish or wax that makes floors slippery. If you must use wax, use non-skid floor wax. Do not have throw rugs and other things on the floor that can make you trip. What can I do with my stairs? Do not leave any items on the stairs. Make sure that there are handrails on both sides of the stairs and use them. Fix handrails that are broken or loose. Make sure that handrails are as long as the stairways. Check any carpeting to make sure that it is firmly attached to the stairs. Fix any carpet that is loose or worn. Avoid having throw rugs at the top or bottom of the stairs. If you do have throw rugs, attach them to the floor with carpet tape. Make sure that you have a light switch at the top of the stairs and the bottom of the stairs. If you do not have them, ask someone to add them for you. What else can I do to help prevent falls? Wear shoes that:  Do not have high  heels. Have rubber bottoms. Are comfortable and fit you well. Are closed at the toe. Do not wear sandals. If you use a stepladder: Make sure that it is fully opened. Do not climb a closed stepladder. Make sure that both sides of the stepladder are locked into place. Ask someone to hold it for you, if possible. Clearly mark and make sure that you can see: Any grab bars or handrails. First and last steps. Where the edge of each step is. Use tools that help you move around (mobility aids) if they are needed. These include: Canes. Walkers. Scooters. Crutches. Turn on the lights when you go into a dark area. Replace any light bulbs as soon as they burn out. Set up your furniture so you have a clear path. Avoid moving your furniture around. If any of your floors are uneven, fix them. If there are any pets around you, be aware of where they are. Review your medicines with your doctor. Some medicines can make you feel dizzy. This can increase your chance of falling. Ask your doctor what other things that you can do to help prevent falls. This information is not intended to replace advice given to you by your health care provider. Make sure you discuss any questions you have with your health care provider. Document Released: 11/25/2008 Document Revised: 07/07/2015 Document Reviewed: 03/05/2014 Elsevier Interactive Patient Education  2017 [employer].

## 2022-08-01 NOTE — Assessment & Plan Note (Signed)
No issues, continue f/u with GI

## 2022-08-01 NOTE — Assessment & Plan Note (Signed)
No recent seizures, will check keppra level and advised to make sure f/u with Neurology

## 2022-08-01 NOTE — Assessment & Plan Note (Signed)
Seen on his last low dose CT scan will repeat

## 2022-08-01 NOTE — Assessment & Plan Note (Signed)
Declines covid 19 vaccine. Discussed risk of covid 39 and if he changes her mind about the vaccine to call the office. Education has been provided regarding the importance of this vaccine but patient still declined. Advised may receive this vaccine at local pharmacy or Health Dept.or vaccine clinic. Aware to provide a copy of the vaccination record if obtained from local pharmacy or Health Dept.  Encouraged to take multivitamin, vitamin d, vitamin c and zinc to increase immune system. Aware can call office if would like to have vaccine here at office.

## 2022-08-02 LAB — CMP14+EGFR
ALT: 10 IU/L (ref 0–44)
AST: 17 IU/L (ref 0–40)
Albumin: 3.1 g/dL — ABNORMAL LOW (ref 3.8–4.9)
Alkaline Phosphatase: 75 IU/L (ref 44–121)
BUN/Creatinine Ratio: 11 (ref 9–20)
BUN: 10 mg/dL (ref 6–24)
Bilirubin Total: 0.3 mg/dL (ref 0.0–1.2)
CO2: 22 mmol/L (ref 20–29)
Calcium: 8.1 mg/dL — ABNORMAL LOW (ref 8.7–10.2)
Chloride: 96 mmol/L (ref 96–106)
Creatinine, Ser: 0.87 mg/dL (ref 0.76–1.27)
Globulin, Total: 3 g/dL (ref 1.5–4.5)
Glucose: 87 mg/dL (ref 70–99)
Potassium: 4 mmol/L (ref 3.5–5.2)
Sodium: 129 mmol/L — ABNORMAL LOW (ref 134–144)
Total Protein: 6.1 g/dL (ref 6.0–8.5)
eGFR: 103 mL/min/{1.73_m2} (ref >59)

## 2022-08-02 LAB — LEVETIRACETAM LEVEL: Levetiracetam Lvl: <2 ug/mL — ABNORMAL LOW (ref 10.0–40.0)

## 2022-08-02 LAB — HEMOGLOBIN A1C
Est. average glucose Bld gHb Est-mCnc: 111 mg/dL
Hgb A1c MFr Bld: 5.5 % (ref 4.8–5.6)

## 2022-08-13 ENCOUNTER — Ambulatory Visit: Payer: Self-pay

## 2022-08-13 NOTE — Patient Outreach (Signed)
  Care Coordination   08/13/2022 Name: Kesler Domres MRN: [id number] DOB: 05/30/1970   Care Coordination Outreach Attempts:  An unsuccessful telephone outreach was attempted for a scheduled appointment today.  Follow Up Plan:  Additional outreach attempts will be made to offer the patient care coordination information and services.   Encounter Outcome:  No Answer   Care Coordination Interventions:  No, not indicated    Delsa Sale, RN, BSN, CCM Care Management Coordinator [facility] Care Management  Direct Phone: [phone]

## 2022-08-20 ENCOUNTER — Telehealth: Payer: Self-pay | Admitting: *Deleted

## 2022-08-20 NOTE — Progress Notes (Signed)
  Care Coordination Note  08/20/2022 Name: Dallyn Nieblas MRN: [id number] DOB: 1970/07/08  [place] Heshimu Kuramoto is a 52 y.o. year old male who is a primary care patient of Arnette Felts, FNP and is actively engaged with the care management team. I reached out to [employer] by phone today to assist with re-scheduling a follow up visit with the RN Case Manager  Follow up plan: Unsuccessful telephone outreach attempt made.   [facility]  Care Coordination Care Guide  Direct Dial: [phone]

## 2022-08-22 NOTE — Progress Notes (Signed)
  Care Coordination Note  08/22/2022 Name: [name] MRN: [id number] DOB: 1970-07-24  [name] is a 52 y.o. year old male who is a primary care patient of Arnette Felts, FNP and is actively engaged with the care management team. I reached out to [employer] by phone today to assist with re-scheduling a follow up visit with the RN Case Manager  Follow up plan: Telephone appointment with care management team member scheduled for:09/06/22  [facility] Coordination Care Guide  Direct Dial: [phone]

## 2022-09-06 ENCOUNTER — Ambulatory Visit: Payer: Self-pay

## 2022-09-06 NOTE — Patient Instructions (Signed)
Visit Information  Thank you for taking time to visit with me today. Please don't hesitate to contact me if I can be of assistance to you.   Following are the goals we discussed today:   Goals Addressed             This Visit's Progress    RN CC Activities - further follow up needed       Care Coordination Interventions: Discussed patient remains to be homeless, however he has an appointment to tour an apartment next week Active listening / Reflection utilized  Emotional Support Provided      To remain Seizure free       Care Coordination Interventions: Evaluation of current treatment plan related to Seizure disorder and patient's adherence to plan as established by provider Reviewed and discussed with patient his Keppra blood level is low, educated on risk for Seizure activity until therapeutic level is achieved Reviewed Keppra dosage, indication and frequency with patient and discussed importance of medication adherence, instructed patient to try to take his medication at the same time everyday without missing doses for best results and to help reduce the risk of having a Seizure and patient verbalizes understanding         Our next appointment is by telephone on 12/05/22 at 12:00 PM  Please call the care guide team at [phone] if you need to cancel or reschedule your appointment.   If you are experiencing a Mental Health or Behavioral Health Crisis or need someone to talk to, please call 1-800-273-TALK (toll free, 24 hour hotline)  The patient verbalized understanding of instructions, educational materials, and care plan provided today and DECLINED offer to receive copy of patient instructions, educational materials, and care plan.   Delsa Sale, RN, BSN, CCM Care Management Coordinator [facility] Care Management Direct Phone: [phone]

## 2022-09-06 NOTE — Patient Outreach (Signed)
  Care Coordination   Follow Up Visit Note   09/06/2022 Name: [name] MRN: [id number] DOB: November 29, 1970  [name] is a 52 y.o. year old male who sees Arnette Felts, FNP for primary care. I spoke with  Giorgi Erin Fulling by phone today.  What matters to the patients health and wellness today?  Patient will increase his salt intake and will not exceed 64 oz of water daily. He will take his Seizure medication exactly as prescribed.     Goals Addressed             This Visit's Progress    RN CC Activities - further follow up needed       Care Coordination Interventions: Discussed patient remains to be homeless, however he has an appointment to tour an apartment next week Active listening / Reflection utilized  Emotional Support Provided      To remain Seizure free       Care Coordination Interventions: Evaluation of current treatment plan related to Seizure disorder and patient's adherence to plan as established by provider Reviewed and discussed with patient his Keppra blood level is low, educated on risk for Seizure activity until therapeutic level is achieved Reviewed Keppra dosage, indication and frequency with patient and discussed importance of medication adherence, instructed patient to try to take his medication at the same time everyday without missing doses for best results and to help reduce the risk of having a Seizure and patient verbalizes understanding     Interventions Today    Flowsheet Row Most Recent Value  Chronic Disease   Chronic disease during today's visit Other  [hyponatremia,  Seizures,  prediabetes]  General Interventions   General Interventions Discussed/Reviewed General Interventions Discussed, General Interventions Reviewed, Labs, Doctor Visits  Doctor Visits Discussed/Reviewed Doctor Visits Discussed, Doctor Visits Reviewed, PCP  Education Interventions   Education Provided Provided Education  Provided Verbal Education On Labs, When  to see the doctor, Medication  Labs Reviewed Hgb A1c  Nutrition Interventions   Nutrition Discussed/Reviewed Nutrition Discussed, Nutrition Reviewed, Fluid intake  [increase Sodium]  Pharmacy Interventions   Pharmacy Dicussed/Reviewed Pharmacy Topics Discussed, Pharmacy Topics Reviewed, Medications and their functions, Medication Adherence          SDOH assessments and interventions completed:  No     Care Coordination Interventions:  Yes, provided   Follow up plan: Follow up call scheduled for 12/05/22 @12 :00 PM    Encounter Outcome:  Pt. Visit Completed

## 2022-10-12 ENCOUNTER — Emergency Department (HOSPITAL_COMMUNITY): Payer: Medicare HMO

## 2022-10-12 ENCOUNTER — Emergency Department (HOSPITAL_COMMUNITY)
Admission: EM | Admit: 2022-10-12 | Discharge: 2022-10-12 | Disposition: A | Discharge status: Home / Self Care | Payer: Medicare HMO | Attending: Emergency Medicine | Admitting: Emergency Medicine

## 2022-10-12 ENCOUNTER — Encounter (HOSPITAL_COMMUNITY): Payer: Self-pay

## 2022-10-12 ENCOUNTER — Other Ambulatory Visit: Payer: Self-pay

## 2022-10-12 DIAGNOSIS — J45909 Unspecified asthma, uncomplicated: Secondary | ICD-10-CM | POA: Diagnosis not present

## 2022-10-12 DIAGNOSIS — S01512A Laceration without foreign body of oral cavity, initial encounter: Secondary | ICD-10-CM | POA: Diagnosis not present

## 2022-10-12 DIAGNOSIS — R569 Unspecified convulsions: Secondary | ICD-10-CM | POA: Diagnosis not present

## 2022-10-12 DIAGNOSIS — X58XXXA Exposure to other specified factors, initial encounter: Secondary | ICD-10-CM | POA: Diagnosis not present

## 2022-10-12 DIAGNOSIS — Z85028 Personal history of other malignant neoplasm of stomach: Secondary | ICD-10-CM | POA: Diagnosis not present

## 2022-10-12 DIAGNOSIS — F17210 Nicotine dependence, cigarettes, uncomplicated: Secondary | ICD-10-CM | POA: Insufficient documentation

## 2022-10-12 DIAGNOSIS — R531 Weakness: Secondary | ICD-10-CM | POA: Diagnosis not present

## 2022-10-12 DIAGNOSIS — Z23 Encounter for immunization: Secondary | ICD-10-CM | POA: Insufficient documentation

## 2022-10-12 DIAGNOSIS — R9431 Abnormal electrocardiogram [ECG] [EKG]: Secondary | ICD-10-CM | POA: Diagnosis not present

## 2022-10-12 DIAGNOSIS — R410 Disorientation, unspecified: Secondary | ICD-10-CM | POA: Diagnosis not present

## 2022-10-12 DIAGNOSIS — R404 Transient alteration of awareness: Secondary | ICD-10-CM | POA: Diagnosis not present

## 2022-10-12 DIAGNOSIS — S0990XA Unspecified injury of head, initial encounter: Secondary | ICD-10-CM | POA: Diagnosis not present

## 2022-10-12 LAB — BASIC METABOLIC PANEL WITH GFR
Anion gap: 14 (ref 5–15)
BUN: 6 mg/dL (ref 6–20)
CO2: 24 mmol/L (ref 22–32)
Calcium: 8.6 mg/dL — ABNORMAL LOW (ref 8.9–10.3)
Chloride: 99 mmol/L (ref 98–111)
Creatinine, Ser: 1.11 mg/dL (ref 0.61–1.24)
GFR, Estimated: >60 mL/min
Glucose, Bld: 94 mg/dL (ref 70–99)
Potassium: 4.4 mmol/L (ref 3.5–5.1)
Sodium: 137 mmol/L (ref 135–145)

## 2022-10-12 LAB — CBC WITH DIFFERENTIAL/PLATELET
Abs Immature Granulocytes: 0 10*3/uL (ref 0.00–0.07)
Basophils Absolute: 0 10*3/uL (ref 0.0–0.1)
Basophils Relative: 0 %
Eosinophils Absolute: 0 10*3/uL (ref 0.0–0.5)
Eosinophils Relative: 0 %
HCT: 35.5 % — ABNORMAL LOW (ref 39.0–52.0)
Hemoglobin: 11.5 g/dL — ABNORMAL LOW (ref 13.0–17.0)
Immature Granulocytes: 0 %
Lymphocytes Relative: 41 %
Lymphs Abs: 1.2 10*3/uL (ref 0.7–4.0)
MCH: 31 pg (ref 26.0–34.0)
MCHC: 32.4 g/dL (ref 30.0–36.0)
MCV: 95.7 fL (ref 80.0–100.0)
Monocytes Absolute: 0.5 10*3/uL (ref 0.1–1.0)
Monocytes Relative: 16 %
Neutro Abs: 1.3 10*3/uL — ABNORMAL LOW (ref 1.7–7.7)
Neutrophils Relative %: 43 %
Platelets: 149 10*3/uL — ABNORMAL LOW (ref 150–400)
RBC: 3.71 MIL/uL — ABNORMAL LOW (ref 4.22–5.81)
RDW: 15.3 % (ref 11.5–15.5)
WBC: 3 10*3/uL — ABNORMAL LOW (ref 4.0–10.5)
nRBC: 0 % (ref 0.0–0.2)

## 2022-10-12 LAB — ETHANOL: Alcohol, Ethyl (B): <10 mg/dL

## 2022-10-12 MED ORDER — LEVETIRACETAM 500 MG PO TABS
500.0000 mg | ORAL_TABLET | Freq: Once | ORAL | Status: AC
  Administered 2022-10-12: 500 mg via ORAL
  Filled 2022-10-12: qty 1

## 2022-10-12 MED ORDER — TETANUS-DIPHTH-ACELL PERTUSSIS 5-2.5-18.5 LF-MCG/0.5 IM SUSY
0.5000 mL | PREFILLED_SYRINGE | Freq: Once | INTRAMUSCULAR | Status: AC
  Administered 2022-10-12: 0.5 mL via INTRAMUSCULAR
  Filled 2022-10-12: qty 0.5

## 2022-10-12 MED ORDER — SODIUM CHLORIDE 0.9 % IV BOLUS
1000.0000 mL | Freq: Once | INTRAVENOUS | Status: AC
  Administered 2022-10-12: 1000 mL via INTRAVENOUS

## 2022-10-12 NOTE — Discharge Instructions (Addendum)
Per [place] DMV statutes, patients with seizures are not allowed to drive until they have been seizure-free for six months.  Other recommendations include using caution when using heavy equipment or power tools. Avoid working on ladders or at heights. Take showers instead of baths.  Do not swim alone.  Ensure the water temperature is not too high on the home water heater. Do not go swimming alone. Do not lock yourself in a room alone (i.e. bathroom). When caring for infants or small children, sit down when holding, feeding, or changing them to minimize risk of injury to the child in the event you have a seizure. Maintain good sleep hygiene. Avoid alcohol.  Also recommend adequate sleep, hydration, good diet and minimize stress.     During the Seizure   - First, ensure adequate ventilation and place patients on the floor on their left side  Loosen clothing around the neck and ensure the airway is patent. If the patient is clenching the teeth, do not force the mouth open with any object as this can cause severe damage - Remove all items from the surrounding that can be hazardous. The patient may be oblivious to what's happening and may not even know what he or she is doing. If the patient is confused and wandering, either gently guide him/her away and block access to outside areas - Reassure the individual and be comforting - Call 911. In most cases, the seizure ends before EMS arrives. However, there are cases when seizures may last over 3 to 5 minutes. Or the individual may have developed breathing difficulties or severe injuries. If a pregnant patient or a person with diabetes develops a seizure, it is prudent to call an ambulance. - Finally, if the patient does not regain full consciousness, then call EMS. Most patients will remain confused for about 45 to 90 minutes after a seizure, so you must use judgment in calling for help. - Avoid restraints but make sure the patient is in a bed with  padded side rails - Place the individual in a lateral position with the neck slightly flexed; this will help the saliva drain from the mouth and prevent the tongue from falling backward - Remove all nearby furniture and other hazards from the area - Provide verbal assurance as the individual is regaining consciousness - Provide the patient with privacy if possible - Call for help and start treatment as ordered by the caregiver   After the Seizure (Postictal Stage)   After a seizure, most patients experience confusion, fatigue, muscle pain and/or a headache. Thus, one should permit the individual to sleep. For the next few days, reassurance is essential. Being calm and helping reorient the person is also of importance.   Most seizures are painless and end spontaneously. Seizures are not harmful to others but can lead to complications such as stress on the lungs, brain and the heart. Individuals with prior lung problems may develop labored breathing and respiratory distress.      It was a pleasure caring for you today in the emergency department.  Please return to the emergency department for any worsening or worrisome symptoms.  

## 2022-10-12 NOTE — ED Notes (Signed)
Pt states unable to urinate at this time. 

## 2022-10-12 NOTE — ED Provider Notes (Signed)
[place] EMERGENCY DEPARTMENT AT [facility] Provider Note  CSN: [id number] Arrival date & time: 10/12/22 1419  Chief Complaint(s) Seizures and Head Injury  HPI [name] is a 52 y.o. male with past medical history as below, significant for seizures on Keppra, GERD, pancreatitis, PTSD who presents to the ED with complaint of seizure  Patient was in a very hot room, thinks he got very hot and then when he woke up EMS was present.  Per bystander he had a seizure which resolved spontaneously.  He has history of seizures, takes Keppra, no deviations from his home or medication regimen.  No illicit drug or frequent alcohol use.  Reports his last seizure was many years ago.  Unsure tetanus status.  At this time he feels back to normal.  Past Medical History Past Medical History:  Diagnosis Date   Abdominal pain    intermittent   Allergy    SEASONAL   Anemia    Aneurysm (HCC)    brain   Asthma    Gastric cancer (HCC)    GERD (gastroesophageal reflux disease)    Pancreatitis    PTSD (post-traumatic stress disorder) 2008   Rectal polyp 01/09/2006   Hyperplastic   Seizures (HCC)    ADMITS TAKING MEDICATION FOR SEIZURE D/T ANEURYSM ,DO NOT KNOW IF HAD SEIZURE. 11/10/18   Patient Active Problem List   Diagnosis Date Noted   Tobacco abuse 08/01/2022   Acute midline low back pain without sciatica 08/01/2022   Seasonal allergies 08/01/2022   Lung nodule 08/01/2022   Atherosclerosis of aorta (HCC) 08/01/2022   History of seizure disorder 08/01/2022   COVID-19 vaccination declined 08/01/2022   Personal history of stomach cancer 04/19/2019   Gastric cancer s/p distal gastrectomy 03/25/2019 03/25/2019   Personal history of colonic polyps 01/29/2019   History of pancreatitis 11/26/2017   Hyponatremia    Partial seizure (HCC) 03/05/2015   History of seizures 03/05/2015   Abdominal pain, chronic, epigastric 07/02/2014   ABDOMINAL PAIN, GENERALIZED 03/07/2010   KNEE  PAIN, RIGHT 11/18/2009   Abnormal glucose 09/08/2009   ANKLE SPRAIN, LEFT 02/05/2009   BACK STRAIN, LUMBAR 02/05/2009   POLYURIA 10/28/2008   ABDOMINAL PAIN, EPIGASTRIC 10/28/2008   INSOMNIA, CHRONIC 10/10/2007   DEPRESSION, CHRONIC 10/10/2007   Asthma 10/10/2007   WOUND INFECTION 10/10/2007   History of alcohol abuse 04/15/2007   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 Authorizing Provider  atorvastatin (LIPITOR) 10 MG tablet Take 1 tablet by mouth at bedtime on MWF 08/01/22   Arnette Felts, FNP  cetirizine (ZYRTEC ALLERGY) 10 MG tablet Take 1 tablet (10 mg total) by mouth daily. 08/01/22 08/01/23  Arnette Felts, FNP  folic acid (FOLVITE) 1 MG tablet Take 1 tablet (1 mg total) by mouth daily. 08/01/22   Arnette Felts, FNP  levETIRAcetam (KEPPRA) 500 MG tablet Take 1 tablet (500 mg total) by mouth 2 (two) times daily. 08/04/21   Van Clines, MD  Multiple Vitamin (MULTIVITAMIN WITH MINERALS) TABS tablet Take 1 tablet by mouth daily. 03/31/19   Maczis, Elmer Sow, PA-C  nicotine (NICODERM CQ - DOSED IN MG/24 HOURS) 21 mg/24hr patch Place 1 patch (21 mg total) onto the skin daily. Patient not taking: Reported on 08/01/2022 10/18/21 10/18/22  Arnette Felts, FNP  oxyCODONE-acetaminophen (PERCOCET) 10-325 MG tablet Take 1 tablet by  mouth every 6 (six) hours as needed for pain. Patient not taking: Reported on 08/01/2022 11/09/21 11/09/22  Arnette Felts, FNP  polyvinyl alcohol (LIQUIFILM TEARS) 1.4 % ophthalmic solution Place 1 drop into both eyes as needed for dry eyes.    [provider]  thiamine (VITAMIN B-1) 100 MG tablet Take 1 tablet (100 mg total) by mouth daily. 08/01/22   Arnette Felts, FNP  Vitamin D, Ergocalciferol, (DRISDOL) 1.25 MG (50000 UNIT) CAPS capsule Take 1 capsule (50,000 Units total) by mouth 2 (two) times a  week. Patient not taking: Reported on 08/01/2022 04/28/20   Arnette Felts, FNP                                                                                                                                    Past Surgical History Past Surgical History:  Procedure Laterality Date   arm surgery Left    repair of tendons "work injury cut"-"pinky finger numb"   BURR HOLE Right 02/28/2015   Procedure: BURR HOLES FOR SUBDURAL HEMATOMA;  Surgeon: Tressie Stalker, MD;  Location: [facility];  Service: Neurosurgery;  Laterality: Right;   CEREBRAL ANEURYSM REPAIR     COLONOSCOPY  2016   colon polyps   ESOPHAGOGASTRODUODENOSCOPY (EGD) WITH PROPOFOL N/A 01/13/2015   Procedure: ESOPHAGOGASTRODUODENOSCOPY (EGD) WITH PROPOFOL;  Surgeon: Rachael Fee, MD;  Location: [facility];  Service: Endoscopy;  Laterality: N/A;   ESOPHAGOGASTRODUODENOSCOPY (EGD) WITH PROPOFOL N/A 03/19/2019   Procedure: ESOPHAGOGASTRODUODENOSCOPY (EGD) WITH PROPOFOL;  Surgeon: Rachael Fee, MD;  Location: [facility];  Service: Endoscopy;  Laterality: N/A;   ESOPHAGOGASTRODUODENOSCOPY ENDOSCOPY     EUS N/A 03/19/2019   Procedure: UPPER ENDOSCOPIC ULTRASOUND (EUS) RADIAL;  Surgeon: Rachael Fee, MD;  Location: [facility];  Service: Endoscopy;  Laterality: N/A;   HERNIA REPAIR     Umbilical   LAPAROSCOPIC GASTRECTOMY  03/25/2019   LAPAROSCOPY DIAGNOSTIC (N/A )   LAPAROSCOPY N/A 03/25/2019   Procedure: LAPAROSCOPY DIAGNOSTIC;  Surgeon: Almond Lint, MD;  Location: [facility] OR;  Service: General;  Laterality: N/A;   LYMPH NODE BIOPSY N/A 03/25/2019   Procedure: Gastric Lymph Node Biopsy;  Surgeon: Almond Lint, MD;  Location: [facility] OR;  Service: General;  Laterality: N/A;   PARTIAL GASTRECTOMY N/A 03/25/2019   Procedure: DISTAL SUBTOTAL GASTRECTOMY;  Surgeon: Almond Lint, MD;  Location: [facility] OR;  Service: General;  Laterality: N/A;   SUBMUCOSAL INJECTION  03/19/2019   Procedure: SUBMUCOSAL INJECTION;  Surgeon: Rachael Fee, MD;   Location: [facility];  Service: Endoscopy;;   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     UPPER GASTROINTESTINAL ENDOSCOPY     Family History Family History  Problem Relation Age of Onset   Stroke Mother    Hypertension Mother    Diabetes Father    Pancreatic cancer Maternal Uncle    Colon cancer Maternal Uncle    Cancer Other    Diabetes  Other    Colon cancer Maternal Grandfather    Colon cancer Paternal Grandfather    Pancreatic cancer Cousin    Esophageal cancer Neg Hx    Rectal cancer Neg Hx    Stomach cancer Neg Hx     Social History Social History   Tobacco Use   Smoking status: Some Days    Current packs/day: 0.00    Types: Cigars, Cigarettes    Last attempt to quit: 03/2019    Years since quitting: 3.5   Smokeless tobacco: Never   Tobacco comments:    smokes a black and mild daily, reports smoking 2 cigarettes a day; reports he is not smoking  Vaping Use   Vaping status: Never Used  Substance Use Topics   Alcohol use: No   Drug use: Yes    Types: Marijuana    Comment: last use 3 days ago per per 08-07-19   Allergies Aspirin, Ibuprofen, Hydrocodone, and Tylenol [acetaminophen]  Review of Systems Review of Systems  Respiratory:  Negative for shortness of breath.   Cardiovascular:  Negative for chest pain.  Skin:  Positive for wound.  Neurological:  Positive for seizures.  All other systems reviewed and are negative.   Physical Exam Vital Signs  I have reviewed the triage vital signs BP 129/73 (BP Location: Left Arm)   Pulse 70   Temp 98.1 F (36.7 C) (Oral)   Resp 18   Ht 6\' 2"  (1.88 m)   Wt 69.4 kg   SpO2 100%   BMI 19.64 kg/m  Physical Exam Vitals and nursing note reviewed.  Constitutional:      General: He is not in acute distress.    Appearance: Normal appearance. He is well-developed.  HENT:     Head: Normocephalic.     Jaw: There is normal jaw occlusion.      Right Ear: External ear normal.     Left Ear: External  ear normal.     Mouth/Throat:     Mouth: Mucous membranes are moist.     Comments: Lateral tongue laceration minor b/l Eyes:     General: No scleral icterus.    Extraocular Movements: Extraocular movements intact.     Pupils: Pupils are equal, round, and reactive to light.  Cardiovascular:     Rate and Rhythm: Normal rate and regular rhythm.     Pulses: Normal pulses.     Heart sounds: Normal heart sounds.  Pulmonary:     Effort: Pulmonary effort is normal. No respiratory distress.     Breath sounds: Normal breath sounds.  Abdominal:     General: Abdomen is flat.     Palpations: Abdomen is soft.     Tenderness: There is no abdominal tenderness.  Musculoskeletal:     Cervical back: Normal range of motion. No rigidity.     Right lower leg: No edema.     Left lower leg: No edema.  Skin:    General: Skin is warm and dry.     Capillary Refill: Capillary refill takes less than 2 seconds.  Neurological:     Mental Status: He is alert and oriented to person, place, and time.     GCS: GCS eye subscore is 4. GCS verbal subscore is 5. GCS motor subscore is 6.     Cranial Nerves: Cranial nerves 2-12 are intact.     Sensory: Sensation is intact.     Motor: Motor function is intact.     Coordination: Coordination is intact.  Comments: Gait testing deferred secondary to patient safety.   Psychiatric:        Mood and Affect: Mood normal.        Behavior: Behavior normal.     ED Results and Treatments Labs (all labs ordered are listed, but only abnormal results are displayed) Labs Reviewed  CBC WITH DIFFERENTIAL/PLATELET - Abnormal; Notable for the following components:      Result Value   WBC 3.0 (*)    RBC 3.71 (*)    Hemoglobin 11.5 (*)    HCT 35.5 (*)    Platelets 149 (*)    Neutro Abs 1.3 (*)    All other components within normal limits  BASIC METABOLIC PANEL - Abnormal; Notable for the following components:   Calcium 8.6 (*)    All other components within normal limits   ETHANOL  LEVETIRACETAM LEVEL  RAPID URINE DRUG SCREEN, HOSP PERFORMED                                                                                                                          Radiology No results found.  Pertinent labs & imaging results that were available during my care of the patient were reviewed by me and considered in my medical decision making (see MDM for details).  Medications Ordered in ED Medications  Tdap (BOOSTRIX) injection 0.5 mL (0.5 mLs Intramuscular Given 10/12/22 1506)  sodium chloride 0.9 % bolus 1,000 mL (1,000 mLs Intravenous New Bag/Given 10/12/22 1505)                                                                                                                                     Procedures Procedures  (including critical care time)  Medical Decision Making / ED Course    Medical Decision Making:    [name] is a 52 y.o. male with past medical history as below, significant for seizures on Keppra, GERD, pancreatitis, PTSD who presents to the ED with complaint of seizure. The complaint involves an extensive differential diagnosis and also carries with it a high risk of complications and morbidity.  Serious etiology was considered. Ddx includes but is not limited to: Breakthrough seizure, induced seizure Differential diagnoses for head trauma includes subdural hematoma, epidural hematoma, acute concussion, traumatic subarachnoid hemorrhage, cerebral contusions, etc.   Complete initial physical exam performed, notably the patient  was Nad, neuro non-focal, conversant .  Reviewed and confirmed nursing documentation for past medical history, family history, social history.  Vital signs reviewed.    Clinical Course as of 10/12/22 1615  Fri Oct 12, 2022  1538 Received sign out from Dr. Wallace Cullens. History of seizure disorder, had seizure while sitting in hot room for less than 1 minute. Pending labs and CT head as he hit his head. At  baseline now.  [WS]    Clinical Course User Index [WS] Lonell Grandchild, MD     Pt here with likely breakthrough seizure  Now back to baseline  Labs / imaging stable  Tetanus updated  Check keppra level  Neuro exam non-focal  Pt given seizure precautions  Abrasion to scalp does not require repair   If imaging/labs stable and exam remains stable can likely be discharged with close o/p neuro f/u; signed out to incoming EDP at shift change                 Additional history obtained: -Additional history obtained from ems -External records from outside source obtained and reviewed including: Chart review including previous notes, labs, imaging, consultation notes including  Home meds Prior labs   Lab Tests: -I ordered, reviewed, and interpreted labs.   The pertinent results include:   Labs Reviewed  CBC WITH DIFFERENTIAL/PLATELET - Abnormal; Notable for the following components:      Result Value   WBC 3.0 (*)    RBC 3.71 (*)    Hemoglobin 11.5 (*)    HCT 35.5 (*)    Platelets 149 (*)    Neutro Abs 1.3 (*)    All other components within normal limits  BASIC METABOLIC PANEL - Abnormal; Notable for the following components:   Calcium 8.6 (*)    All other components within normal limits  ETHANOL  LEVETIRACETAM LEVEL  RAPID URINE DRUG SCREEN, HOSP PERFORMED    Notable for labs pending  EKG   EKG Interpretation Date/Time:  Friday October 12 2022 14:28:11 EDT Ventricular Rate:  73 PR Interval:  160 QRS Duration:  85 QT Interval:  413 QTC Calculation: 456 R Axis:   84  Text Interpretation: Sinus rhythm Confirmed by Tanda Rockers ([id number]) on 10/12/2022 2:36:46 PM         Imaging Studies ordered: I ordered imaging studies including CTH I independently visualized the following imaging with scope of interpretation limited to determining acute life threatening conditions related to emergency care; findings noted above, significant for pending  I  independently visualized and interpreted imaging. I agree with the radiologist interpretation   Medicines ordered and prescription drug management: Meds ordered this encounter  Medications   Tdap (BOOSTRIX) injection 0.5 mL   sodium chloride 0.9 % bolus 1,000 mL    -I have reviewed the patients home medicines and have made adjustments as needed   Consultations Obtained: na   Cardiac Monitoring: Continuous pulse oximetry interpreted by myself, 99% on RA.    Social Determinants of Health:  Diagnosis or treatment significantly limited by social determinants of health: current smoker   Reevaluation: After the interventions noted above, I reevaluated the patient and found that they have stayed the same  Co morbidities that complicate the patient evaluation  Past Medical History:  Diagnosis Date   Abdominal pain    intermittent   Allergy    SEASONAL   Anemia    Aneurysm (HCC)    brain   Asthma    Gastric cancer (HCC)    GERD (gastroesophageal reflux disease)  Pancreatitis    PTSD (post-traumatic stress disorder) 2008   Rectal polyp 01/09/2006   Hyperplastic   Seizures (HCC)    ADMITS TAKING MEDICATION FOR SEIZURE D/T ANEURYSM ,DO NOT KNOW IF HAD SEIZURE. 11/10/18      Dispostion: Disposition decision including need for hospitalization was considered, and patient disposition pending at time of sign out.    Final Clinical Impression(s) / ED Diagnoses Final diagnoses:  Seizure (HCC)        Sloan Leiter, DO 10/12/22 1615

## 2022-10-12 NOTE — ED Triage Notes (Signed)
Per EMS, Pt, from work, presents following a witnessed seizure.  Witness reported Pt slid out of a chair and scraped face on a brick wall during seizure.  Pt denies pain.  Pt is A&Ox4.  Pt reports he is compliant w/ Keppra.

## 2022-10-12 NOTE — ED Provider Notes (Signed)
   ED Course / MDM   Clinical Course as of 10/12/22 1901  Fri Oct 12, 2022  1538 Received sign out from Dr. Wallace Cullens. History of seizure disorder, had seizure while sitting in hot room for less than 1 minute. Pending labs and CT head as he hit his head. At baseline now.  [WS]  1813 CT Head Wo Contrast [WS]  1859 Reassessed patient.  No further seizures.  He feels at baseline.  He on further history admits that he did not take his Keppra this morning and is due for his evening dose so we will give evening dose of Keppra.  Reemphasized [place] DMV rules that he should not drive until at least 6 months or cleared by his neurologist. Will discharge patient to home. All questions answered. Patient comfortable with plan of discharge. Return precautions discussed with patient and specified on the after visit summary.  [WS]    Clinical Course User Index [WS] Lonell Grandchild, MD   Medical Decision Making Amount and/or Complexity of Data Reviewed Labs: ordered. Radiology: ordered. Decision-making details documented in ED Course.  Risk Prescription drug management.         Lonell Grandchild, MD 10/12/22 1901

## 2022-10-15 LAB — LEVETIRACETAM LEVEL: Levetiracetam Lvl: <2 ug/mL — ABNORMAL LOW (ref 10.0–40.0)

## 2022-10-17 ENCOUNTER — Telehealth: Payer: Self-pay

## 2022-10-17 NOTE — Transitions of Care (Post Inpatient/ED Visit) (Signed)
   10/17/2022  Name: Jackob Vanantwerp MRN: [id number] DOB: January 05, 1971  Today's TOC FU Call Status: Today's TOC FU Call Status:: Successful TOC FU Call Completed TOC FU Call Complete Date: 10/17/22 Patient's Name and Date of Birth confirmed.  Transition Care Management Follow-up Telephone Call Date of Discharge: 10/12/22 Discharge Facility: [facility]) Type of Discharge: Emergency Department Reason for ED Visit: Other: How have you been since you were released from the hospital?: Better Any questions or concerns?: No  Items Reviewed: Did you receive and understand the discharge instructions provided?: Yes Medications obtained,verified, and reconciled?: Yes (Medications Reviewed) Any new allergies since your discharge?: No Dietary orders reviewed?: NA Do you have support at home?: No  Medications Reviewed Today: Medications Reviewed Today   Medications were not reviewed in this encounter     Home Care and Equipment/Supplies: Were Home Health Services Ordered?: NA Any new equipment or medical supplies ordered?: NA  Functional Questionnaire: Do you need assistance with bathing/showering or dressing?: No Do you need assistance with meal preparation?: No Do you need assistance with eating?: No Do you have difficulty maintaining continence: No Do you need assistance with getting out of bed/getting out of a chair/moving?: No Do you have difficulty managing or taking your medications?: No  Follow up appointments reviewed: PCP Follow-up appointment confirmed?: Yes Date of PCP follow-up appointment?: 10/19/22 Follow-up Provider: [facility] Follow-up appointment confirmed?: NA Do you need transportation to your follow-up appointment?: No Do you understand care options if your condition(s) worsen?: Yes-patient verbalized understanding    SIGNATUREYL,RMA

## 2022-10-19 ENCOUNTER — Ambulatory Visit: Payer: Medicare HMO | Admitting: Family Medicine

## 2022-10-25 ENCOUNTER — Telehealth: Payer: Self-pay

## 2022-10-25 NOTE — Telephone Encounter (Signed)
Transition Care Management Follow-up Telephone Call Date of discharge and from where: 10/12/2022 [facility] How have you been since you were released from the hospital? Patient stated he is feeling better. Any questions or concerns? No  Items Reviewed: Did the pt receive and understand the discharge instructions provided? Yes  Medications obtained and verified?  No medication prescribed. Other? No  Any new allergies since your discharge? No  Dietary orders reviewed? Yes Do you have support at home? Yes   Follow up appointments reviewed:  PCP Hospital f/u appt confirmed? Yes  Scheduled to see Arnette Felts, FNP on 12/03/2022 @ [facility] Specialist Hospital f/u appt confirmed? No  Scheduled to see  on  @ . Are transportation arrangements needed? No  If their condition worsens, is the pt aware to call PCP or go to the Emergency Dept.? Yes Was the patient provided with contact information for the PCP's office or ED? Yes Was to pt encouraged to call back with questions or concerns? Yes  Tifani Dack [facility]  [facility] Guide Direct Dial: [phone]  Website: [email or web address]

## 2022-12-03 ENCOUNTER — Ambulatory Visit: Payer: Medicare HMO | Admitting: Nurse Practitioner

## 2022-12-03 NOTE — Progress Notes (Deleted)
Madelaine Bhat, CMA,acting as a Neurosurgeon for Arnette Felts, FNP.,have documented all relevant documentation on the behalf of Arnette Felts, FNP,as directed by  Arnette Felts, FNP while in the presence of Arnette Felts, FNP.  Subjective:  Patient ID: [name] , male    DOB: 07/23/1970 , 52 y.o.   MRN: [id number]  No chief complaint on file.   HPI  Patient presents today for a bp and dm follow up, Patient reports compliance with medication. Patient denies any chest pain, SOB, or headaches. Patient has no concerns today.     Past Medical History:  Diagnosis Date  . Abdominal pain    intermittent  . Allergy    SEASONAL  . Anemia   . Aneurysm (HCC)    brain  . Asthma   . Gastric cancer (HCC)   . GERD (gastroesophageal reflux disease)   . Pancreatitis   . PTSD (post-traumatic stress disorder) 2008  . Rectal polyp 01/09/2006   Hyperplastic  . Seizures (HCC)    ADMITS TAKING MEDICATION FOR SEIZURE D/T ANEURYSM ,DO NOT KNOW IF HAD SEIZURE. 11/10/18     Family History  Problem Relation Age of Onset  . Stroke Mother   . Hypertension Mother   . Diabetes Father   . Pancreatic cancer Maternal Uncle   . Colon cancer Maternal Uncle   . Cancer Other   . Diabetes Other   . Colon cancer Maternal Grandfather   . Colon cancer Paternal Grandfather   . Pancreatic cancer Cousin   . Esophageal cancer Neg Hx   . Rectal cancer Neg Hx   . Stomach cancer Neg Hx      Current Outpatient Medications:  .  atorvastatin (LIPITOR) 10 MG tablet, Take 1 tablet by mouth at bedtime on MWF, Disp: 45 tablet, Rfl: 1 .  cetirizine (ZYRTEC ALLERGY) 10 MG tablet, Take 1 tablet (10 mg total) by mouth daily., Disp: 90 tablet, Rfl: 1 .  folic acid (FOLVITE) 1 MG tablet, Take 1 tablet (1 mg total) by mouth daily., Disp: 90 tablet, Rfl: 1 .  levETIRAcetam (KEPPRA) 500 MG tablet, Take 1 tablet (500 mg total) by mouth 2 (two) times daily., Disp: 180 tablet, Rfl: 3 .  Multiple Vitamin (MULTIVITAMIN WITH  MINERALS) TABS tablet, Take 1 tablet by mouth daily., Disp:  , Rfl:  .  polyvinyl alcohol (LIQUIFILM TEARS) 1.4 % ophthalmic solution, Place 1 drop into both eyes as needed for dry eyes., Disp: , Rfl:  .  thiamine (VITAMIN B-1) 100 MG tablet, Take 1 tablet (100 mg total) by mouth daily., Disp: 90 tablet, Rfl: 1 .  Vitamin D, Ergocalciferol, (DRISDOL) 1.25 MG (50000 UNIT) CAPS capsule, Take 1 capsule (50,000 Units total) by mouth 2 (two) times a week. (Patient not taking: Reported on 08/01/2022), Disp: 24 capsule, Rfl: 1   Allergies  Allergen Reactions  . Aspirin Anaphylaxis  . Ibuprofen Anaphylaxis  . Hydrocodone Hives and Itching  . Tylenol [Acetaminophen]     Upset stomach     Review of Systems   There were no vitals filed for this visit. There is no height or weight on file to calculate BMI.  Wt Readings from Last 3 Encounters:  10/12/22 153 lb (69.4 kg)  08/01/22 153 lb (69.4 kg)  08/01/22 153 lb 6.4 oz (69.6 kg)    The 10-year ASCVD risk score (Arnett DK, et al., 2019) is: 8.6%   Values used to calculate the score:     Age: 69 years  Sex: Male     Is Non-Hispanic African American: Yes     Diabetic: No     Tobacco smoker: Yes     Systolic Blood Pressure: 122 mmHg     Is BP treated: No     HDL Cholesterol: 61 mg/dL     Total Cholesterol: 147 mg/dL  Objective:  Physical Exam      Assessment And Plan:  Abnormal glucose    No follow-ups on file.  Patient was given opportunity to ask questions. Patient verbalized understanding of the plan and was able to repeat key elements of the plan. All questions were answered to their satisfaction.    Jeanell Sparrow, FNP, have reviewed all documentation for this visit. The documentation on 12/03/22 for the exam, diagnosis, procedures, and orders are all accurate and complete.   IF YOU HAVE BEEN REFERRED TO A SPECIALIST, IT MAY TAKE 1-2 WEEKS TO SCHEDULE/PROCESS THE REFERRAL. IF YOU HAVE NOT HEARD FROM US/SPECIALIST IN TWO  WEEKS, PLEASE GIVE [place] A CALL AT [phone].

## 2022-12-05 ENCOUNTER — Ambulatory Visit: Payer: Self-pay

## 2022-12-05 NOTE — Patient Outreach (Signed)
Care Coordination   12/05/2022 Name: [name] MRN: [id number] DOB: 1970-07-01   Care Coordination Outreach Attempts:  An unsuccessful telephone outreach was attempted for a scheduled appointment today.  Follow Up Plan:  Additional outreach attempts will be made to offer the patient care coordination information and services.   Encounter Outcome:  No Answer   Care Coordination Interventions:  No, not indicated    Delsa Sale RN BSN CCM [place]  [facility] Nurse Care Coordinator  Direct Dial: [phone] Website: [email or web address]

## 2022-12-13 ENCOUNTER — Ambulatory Visit: Payer: Medicare HMO | Admitting: Nurse Practitioner

## 2023-01-15 ENCOUNTER — Emergency Department (HOSPITAL_COMMUNITY)
Admission: EM | Admit: 2023-01-15 | Discharge: 2023-02-13 | Disposition: E | Payer: Medicare HMO | Attending: Emergency Medicine | Admitting: Emergency Medicine

## 2023-01-15 DIAGNOSIS — X749XXA Intentional self-harm by unspecified firearm discharge, initial encounter: Secondary | ICD-10-CM | POA: Diagnosis not present

## 2023-01-15 DIAGNOSIS — R0902 Hypoxemia: Secondary | ICD-10-CM | POA: Diagnosis not present

## 2023-01-15 DIAGNOSIS — S0993XA Unspecified injury of face, initial encounter: Secondary | ICD-10-CM | POA: Diagnosis present

## 2023-01-15 DIAGNOSIS — Y248XXA Other firearm discharge, undetermined intent, initial encounter: Secondary | ICD-10-CM | POA: Diagnosis not present

## 2023-01-15 DIAGNOSIS — I468 Cardiac arrest due to other underlying condition: Secondary | ICD-10-CM | POA: Insufficient documentation

## 2023-01-15 DIAGNOSIS — S0193XA Puncture wound without foreign body of unspecified part of head, initial encounter: Secondary | ICD-10-CM | POA: Insufficient documentation

## 2023-01-15 DIAGNOSIS — W3400XA Accidental discharge from unspecified firearms or gun, initial encounter: Secondary | ICD-10-CM | POA: Insufficient documentation

## 2023-01-15 DIAGNOSIS — I499 Cardiac arrhythmia, unspecified: Secondary | ICD-10-CM | POA: Diagnosis not present

## 2023-01-15 DIAGNOSIS — R0689 Other abnormalities of breathing: Secondary | ICD-10-CM | POA: Diagnosis not present

## 2023-01-15 DIAGNOSIS — R001 Bradycardia, unspecified: Secondary | ICD-10-CM | POA: Diagnosis not present

## 2023-01-15 DIAGNOSIS — I469 Cardiac arrest, cause unspecified: Secondary | ICD-10-CM | POA: Diagnosis not present

## 2023-01-15 DIAGNOSIS — S0183XA Puncture wound without foreign body of other part of head, initial encounter: Secondary | ICD-10-CM | POA: Diagnosis not present

## 2023-01-15 MED ORDER — EPINEPHRINE 1 MG/10ML IJ SOSY
PREFILLED_SYRINGE | INTRAMUSCULAR | Status: AC | PRN
  Administered 2023-01-15: 1 mg via INTRAVENOUS

## 2023-01-22 ENCOUNTER — Telehealth: Payer: Self-pay | Admitting: Nurse Practitioner

## 2023-01-22 NOTE — Telephone Encounter (Signed)
Received a message as the Referral specialist had received an in basket request and she informed me of the patients passing. Called father [name] and expressed our condolences, appreciative of phone call.
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
## 2023-02-13 NOTE — ED Notes (Signed)
Spoke with Darrick Laural Benes from [place]. Reference number [id number]

## 2023-02-13 NOTE — Progress Notes (Signed)
Orthopedic Tech Progress Note Patient Details:  [name] 16-Dec-1970 [id number]  Patient ID: [name], male   DOB: 07/28/1970, 53 y.o.   MRN: [id number] Level I; not needed. [name] 01/16/2023, 10:26 PM
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
## 2023-02-13 NOTE — ED Notes (Signed)
Pt transported to the morgue  
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
## 2023-02-13 DEATH — deceased

## 2023-08-07 ENCOUNTER — Ambulatory Visit: Payer: Self-pay | Admitting: Nurse Practitioner
